# Patient Record
Sex: Female | Born: 1945
Health system: Southern US, Community
[De-identification: ages and names within clinical notes are randomized; demographics above are authoritative.]

## PROBLEM LIST (undated history)

## (undated) DIAGNOSIS — M25562 Pain in left knee: Secondary | ICD-10-CM

## (undated) DIAGNOSIS — I6521 Occlusion and stenosis of right carotid artery: Secondary | ICD-10-CM

## (undated) DIAGNOSIS — L089 Local infection of the skin and subcutaneous tissue, unspecified: Secondary | ICD-10-CM

## (undated) DIAGNOSIS — I639 Cerebral infarction, unspecified: Secondary | ICD-10-CM

## (undated) DIAGNOSIS — I209 Angina pectoris, unspecified: Secondary | ICD-10-CM

## (undated) DIAGNOSIS — E785 Hyperlipidemia, unspecified: Secondary | ICD-10-CM

## (undated) DIAGNOSIS — M479 Spondylosis, unspecified: Secondary | ICD-10-CM

## (undated) DIAGNOSIS — Z823 Family history of stroke: Secondary | ICD-10-CM

## (undated) DIAGNOSIS — W19XXXA Unspecified fall, initial encounter: Secondary | ICD-10-CM

## (undated) DIAGNOSIS — Z593 Problems related to living in residential institution: Secondary | ICD-10-CM

## (undated) DIAGNOSIS — M810 Age-related osteoporosis without current pathological fracture: Secondary | ICD-10-CM

## (undated) DIAGNOSIS — E119 Type 2 diabetes mellitus without complications: Secondary | ICD-10-CM

## (undated) DIAGNOSIS — I110 Hypertensive heart disease with heart failure: Secondary | ICD-10-CM

## (undated) DIAGNOSIS — M5136 Other intervertebral disc degeneration, lumbar region: Secondary | ICD-10-CM

## (undated) DIAGNOSIS — I5032 Chronic diastolic (congestive) heart failure: Secondary | ICD-10-CM

## (undated) DIAGNOSIS — E1165 Type 2 diabetes mellitus with hyperglycemia: Secondary | ICD-10-CM

## (undated) DIAGNOSIS — J449 Chronic obstructive pulmonary disease, unspecified: Secondary | ICD-10-CM

## (undated) DIAGNOSIS — I63231 Cerebral infarction due to unspecified occlusion or stenosis of right carotid arteries: Secondary | ICD-10-CM

## (undated) DIAGNOSIS — E1142 Type 2 diabetes mellitus with diabetic polyneuropathy: Secondary | ICD-10-CM

## (undated) DIAGNOSIS — I10 Essential (primary) hypertension: Secondary | ICD-10-CM

## (undated) DIAGNOSIS — M51369 Other intervertebral disc degeneration, lumbar region without mention of lumbar back pain or lower extremity pain: Secondary | ICD-10-CM

## (undated) DIAGNOSIS — I509 Heart failure, unspecified: Secondary | ICD-10-CM

## (undated) DIAGNOSIS — Z789 Other specified health status: Secondary | ICD-10-CM

## (undated) HISTORY — DX: Cerebral infarction, unspecified: I63.9

## (undated) HISTORY — PX: APPENDECTOMY: SHX54

## (undated) HISTORY — DX: Age-related osteoporosis without current pathological fracture: M81.0

## (undated) HISTORY — DX: Other specified health status: Z78.9

## (undated) HISTORY — PX: ABDOMINAL HYSTERECTOMY: SHX81

## (undated) HISTORY — DX: Hyperlipidemia, unspecified: E78.5

## (undated) HISTORY — DX: Essential (primary) hypertension: I10

## (undated) HISTORY — PX: CARDIAC CATHETERIZATION: SHX172

## (undated) HISTORY — DX: Pain in left knee: M25.562

## (undated) HISTORY — DX: Other intervertebral disc degeneration, lumbar region without mention of lumbar back pain or lower extremity pain: M51.369

## (undated) HISTORY — DX: Type 2 diabetes mellitus without complications: E11.9

## (undated) HISTORY — PX: CHOLECYSTECTOMY: SHX55

## (undated) HISTORY — DX: Spondylosis, unspecified: M47.9

## (undated) HISTORY — DX: Other intervertebral disc degeneration, lumbar region: M51.36

## (undated) HISTORY — DX: Problems related to living in residential institution: Z59.3

## (undated) HISTORY — DX: Heart failure, unspecified: I50.9

---
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
## 2011-08-02 DIAGNOSIS — Z6841 Body Mass Index (BMI) 40.0 and over, adult: Secondary | ICD-10-CM | POA: Diagnosis not present

## 2011-08-02 DIAGNOSIS — IMO0001 Reserved for inherently not codable concepts without codable children: Secondary | ICD-10-CM | POA: Diagnosis not present

## 2011-08-02 DIAGNOSIS — E785 Hyperlipidemia, unspecified: Secondary | ICD-10-CM | POA: Diagnosis not present

## 2011-08-02 DIAGNOSIS — Z79899 Other long term (current) drug therapy: Secondary | ICD-10-CM | POA: Diagnosis not present

## 2011-08-02 DIAGNOSIS — I10 Essential (primary) hypertension: Secondary | ICD-10-CM | POA: Diagnosis not present

## 2011-08-02 DIAGNOSIS — I6789 Other cerebrovascular disease: Secondary | ICD-10-CM | POA: Diagnosis not present

## 2011-09-04 DIAGNOSIS — Z1231 Encounter for screening mammogram for malignant neoplasm of breast: Secondary | ICD-10-CM | POA: Diagnosis not present

## 2011-09-20 DIAGNOSIS — I5030 Unspecified diastolic (congestive) heart failure: Secondary | ICD-10-CM | POA: Diagnosis not present

## 2011-09-20 DIAGNOSIS — I209 Angina pectoris, unspecified: Secondary | ICD-10-CM | POA: Diagnosis not present

## 2011-09-20 DIAGNOSIS — I10 Essential (primary) hypertension: Secondary | ICD-10-CM | POA: Diagnosis not present

## 2011-09-20 DIAGNOSIS — J449 Chronic obstructive pulmonary disease, unspecified: Secondary | ICD-10-CM | POA: Diagnosis not present

## 2011-10-31 DIAGNOSIS — B351 Tinea unguium: Secondary | ICD-10-CM | POA: Diagnosis not present

## 2011-10-31 DIAGNOSIS — E1142 Type 2 diabetes mellitus with diabetic polyneuropathy: Secondary | ICD-10-CM | POA: Diagnosis not present

## 2011-10-31 DIAGNOSIS — E1149 Type 2 diabetes mellitus with other diabetic neurological complication: Secondary | ICD-10-CM | POA: Diagnosis not present

## 2011-11-03 DIAGNOSIS — Z6841 Body Mass Index (BMI) 40.0 and over, adult: Secondary | ICD-10-CM | POA: Diagnosis not present

## 2011-11-03 DIAGNOSIS — I6789 Other cerebrovascular disease: Secondary | ICD-10-CM | POA: Diagnosis not present

## 2011-11-03 DIAGNOSIS — I10 Essential (primary) hypertension: Secondary | ICD-10-CM | POA: Diagnosis not present

## 2011-11-03 DIAGNOSIS — Z79899 Other long term (current) drug therapy: Secondary | ICD-10-CM | POA: Diagnosis not present

## 2011-11-03 DIAGNOSIS — I6350 Cerebral infarction due to unspecified occlusion or stenosis of unspecified cerebral artery: Secondary | ICD-10-CM | POA: Diagnosis not present

## 2011-11-03 DIAGNOSIS — IMO0001 Reserved for inherently not codable concepts without codable children: Secondary | ICD-10-CM | POA: Diagnosis not present

## 2011-11-03 DIAGNOSIS — E785 Hyperlipidemia, unspecified: Secondary | ICD-10-CM | POA: Diagnosis not present

## 2012-02-02 DIAGNOSIS — Z79899 Other long term (current) drug therapy: Secondary | ICD-10-CM | POA: Diagnosis not present

## 2012-02-02 DIAGNOSIS — I5030 Unspecified diastolic (congestive) heart failure: Secondary | ICD-10-CM | POA: Diagnosis not present

## 2012-02-02 DIAGNOSIS — I6789 Other cerebrovascular disease: Secondary | ICD-10-CM | POA: Diagnosis not present

## 2012-02-02 DIAGNOSIS — E785 Hyperlipidemia, unspecified: Secondary | ICD-10-CM | POA: Diagnosis not present

## 2012-02-02 DIAGNOSIS — I10 Essential (primary) hypertension: Secondary | ICD-10-CM | POA: Diagnosis not present

## 2012-02-02 DIAGNOSIS — I6350 Cerebral infarction due to unspecified occlusion or stenosis of unspecified cerebral artery: Secondary | ICD-10-CM | POA: Diagnosis not present

## 2012-03-04 DIAGNOSIS — E119 Type 2 diabetes mellitus without complications: Secondary | ICD-10-CM | POA: Diagnosis not present

## 2012-03-04 DIAGNOSIS — I10 Essential (primary) hypertension: Secondary | ICD-10-CM | POA: Diagnosis not present

## 2012-03-04 DIAGNOSIS — I509 Heart failure, unspecified: Secondary | ICD-10-CM | POA: Diagnosis not present

## 2012-03-04 DIAGNOSIS — I5030 Unspecified diastolic (congestive) heart failure: Secondary | ICD-10-CM | POA: Diagnosis not present

## 2012-04-30 DIAGNOSIS — E785 Hyperlipidemia, unspecified: Secondary | ICD-10-CM | POA: Diagnosis not present

## 2012-04-30 DIAGNOSIS — I10 Essential (primary) hypertension: Secondary | ICD-10-CM | POA: Diagnosis not present

## 2012-04-30 DIAGNOSIS — Z6841 Body Mass Index (BMI) 40.0 and over, adult: Secondary | ICD-10-CM | POA: Diagnosis not present

## 2012-04-30 DIAGNOSIS — I6789 Other cerebrovascular disease: Secondary | ICD-10-CM | POA: Diagnosis not present

## 2012-05-02 DIAGNOSIS — E1142 Type 2 diabetes mellitus with diabetic polyneuropathy: Secondary | ICD-10-CM | POA: Diagnosis not present

## 2012-05-02 DIAGNOSIS — B351 Tinea unguium: Secondary | ICD-10-CM | POA: Diagnosis not present

## 2012-05-02 DIAGNOSIS — E1149 Type 2 diabetes mellitus with other diabetic neurological complication: Secondary | ICD-10-CM | POA: Diagnosis not present

## 2012-06-13 DIAGNOSIS — H524 Presbyopia: Secondary | ICD-10-CM | POA: Diagnosis not present

## 2012-06-13 DIAGNOSIS — H25099 Other age-related incipient cataract, unspecified eye: Secondary | ICD-10-CM | POA: Diagnosis not present

## 2012-07-30 DIAGNOSIS — E785 Hyperlipidemia, unspecified: Secondary | ICD-10-CM | POA: Diagnosis not present

## 2012-07-30 DIAGNOSIS — I6789 Other cerebrovascular disease: Secondary | ICD-10-CM | POA: Diagnosis not present

## 2012-07-30 DIAGNOSIS — R5381 Other malaise: Secondary | ICD-10-CM | POA: Diagnosis not present

## 2012-07-30 DIAGNOSIS — I10 Essential (primary) hypertension: Secondary | ICD-10-CM | POA: Diagnosis not present

## 2012-07-30 DIAGNOSIS — Z79899 Other long term (current) drug therapy: Secondary | ICD-10-CM | POA: Diagnosis not present

## 2012-07-30 DIAGNOSIS — Z6841 Body Mass Index (BMI) 40.0 and over, adult: Secondary | ICD-10-CM | POA: Diagnosis not present

## 2012-09-04 DIAGNOSIS — Z1231 Encounter for screening mammogram for malignant neoplasm of breast: Secondary | ICD-10-CM | POA: Diagnosis not present

## 2012-09-24 DIAGNOSIS — E1169 Type 2 diabetes mellitus with other specified complication: Secondary | ICD-10-CM | POA: Diagnosis not present

## 2012-09-24 DIAGNOSIS — N390 Urinary tract infection, site not specified: Secondary | ICD-10-CM | POA: Diagnosis not present

## 2012-09-24 DIAGNOSIS — Z6841 Body Mass Index (BMI) 40.0 and over, adult: Secondary | ICD-10-CM | POA: Diagnosis not present

## 2012-09-27 DIAGNOSIS — M25559 Pain in unspecified hip: Secondary | ICD-10-CM | POA: Diagnosis not present

## 2012-09-27 DIAGNOSIS — M169 Osteoarthritis of hip, unspecified: Secondary | ICD-10-CM | POA: Diagnosis not present

## 2012-10-22 DIAGNOSIS — I509 Heart failure, unspecified: Secondary | ICD-10-CM | POA: Diagnosis not present

## 2012-10-22 DIAGNOSIS — I209 Angina pectoris, unspecified: Secondary | ICD-10-CM | POA: Diagnosis not present

## 2012-10-22 DIAGNOSIS — E1142 Type 2 diabetes mellitus with diabetic polyneuropathy: Secondary | ICD-10-CM | POA: Diagnosis not present

## 2012-10-22 DIAGNOSIS — I5030 Unspecified diastolic (congestive) heart failure: Secondary | ICD-10-CM | POA: Diagnosis not present

## 2012-10-22 DIAGNOSIS — E1149 Type 2 diabetes mellitus with other diabetic neurological complication: Secondary | ICD-10-CM | POA: Diagnosis not present

## 2012-10-22 DIAGNOSIS — I110 Hypertensive heart disease with heart failure: Secondary | ICD-10-CM | POA: Diagnosis not present

## 2012-10-30 DIAGNOSIS — Z1331 Encounter for screening for depression: Secondary | ICD-10-CM | POA: Diagnosis not present

## 2012-10-30 DIAGNOSIS — I10 Essential (primary) hypertension: Secondary | ICD-10-CM | POA: Diagnosis not present

## 2012-10-30 DIAGNOSIS — Z9181 History of falling: Secondary | ICD-10-CM | POA: Diagnosis not present

## 2012-10-30 DIAGNOSIS — Z79899 Other long term (current) drug therapy: Secondary | ICD-10-CM | POA: Diagnosis not present

## 2012-10-30 DIAGNOSIS — I6789 Other cerebrovascular disease: Secondary | ICD-10-CM | POA: Diagnosis not present

## 2012-10-30 DIAGNOSIS — Z6841 Body Mass Index (BMI) 40.0 and over, adult: Secondary | ICD-10-CM | POA: Diagnosis not present

## 2012-10-30 DIAGNOSIS — E785 Hyperlipidemia, unspecified: Secondary | ICD-10-CM | POA: Diagnosis not present

## 2013-02-03 DIAGNOSIS — I10 Essential (primary) hypertension: Secondary | ICD-10-CM | POA: Diagnosis not present

## 2013-02-03 DIAGNOSIS — Z6841 Body Mass Index (BMI) 40.0 and over, adult: Secondary | ICD-10-CM | POA: Diagnosis not present

## 2013-02-03 DIAGNOSIS — E785 Hyperlipidemia, unspecified: Secondary | ICD-10-CM | POA: Diagnosis not present

## 2013-02-03 DIAGNOSIS — Z79899 Other long term (current) drug therapy: Secondary | ICD-10-CM | POA: Diagnosis not present

## 2013-02-03 DIAGNOSIS — I6789 Other cerebrovascular disease: Secondary | ICD-10-CM | POA: Diagnosis not present

## 2013-02-11 DIAGNOSIS — E1142 Type 2 diabetes mellitus with diabetic polyneuropathy: Secondary | ICD-10-CM | POA: Diagnosis not present

## 2013-02-11 DIAGNOSIS — B351 Tinea unguium: Secondary | ICD-10-CM | POA: Diagnosis not present

## 2013-02-11 DIAGNOSIS — E1149 Type 2 diabetes mellitus with other diabetic neurological complication: Secondary | ICD-10-CM | POA: Diagnosis not present

## 2013-04-25 DIAGNOSIS — E1142 Type 2 diabetes mellitus with diabetic polyneuropathy: Secondary | ICD-10-CM | POA: Diagnosis not present

## 2013-04-25 DIAGNOSIS — Z862 Personal history of diseases of the blood and blood-forming organs and certain disorders involving the immune mechanism: Secondary | ICD-10-CM | POA: Diagnosis not present

## 2013-04-25 DIAGNOSIS — I5030 Unspecified diastolic (congestive) heart failure: Secondary | ICD-10-CM | POA: Diagnosis not present

## 2013-04-25 DIAGNOSIS — I209 Angina pectoris, unspecified: Secondary | ICD-10-CM | POA: Diagnosis not present

## 2013-04-25 DIAGNOSIS — E1149 Type 2 diabetes mellitus with other diabetic neurological complication: Secondary | ICD-10-CM | POA: Diagnosis not present

## 2013-04-25 DIAGNOSIS — I110 Hypertensive heart disease with heart failure: Secondary | ICD-10-CM | POA: Diagnosis not present

## 2013-04-25 DIAGNOSIS — I509 Heart failure, unspecified: Secondary | ICD-10-CM | POA: Diagnosis not present

## 2013-05-06 DIAGNOSIS — Z6841 Body Mass Index (BMI) 40.0 and over, adult: Secondary | ICD-10-CM | POA: Diagnosis not present

## 2013-05-06 DIAGNOSIS — IMO0001 Reserved for inherently not codable concepts without codable children: Secondary | ICD-10-CM | POA: Diagnosis not present

## 2013-05-06 DIAGNOSIS — Z79899 Other long term (current) drug therapy: Secondary | ICD-10-CM | POA: Diagnosis not present

## 2013-05-06 DIAGNOSIS — I10 Essential (primary) hypertension: Secondary | ICD-10-CM | POA: Diagnosis not present

## 2013-05-06 DIAGNOSIS — I6789 Other cerebrovascular disease: Secondary | ICD-10-CM | POA: Diagnosis not present

## 2013-05-06 DIAGNOSIS — E785 Hyperlipidemia, unspecified: Secondary | ICD-10-CM | POA: Diagnosis not present

## 2013-05-06 DIAGNOSIS — Z23 Encounter for immunization: Secondary | ICD-10-CM | POA: Diagnosis not present

## 2013-07-31 DIAGNOSIS — R5383 Other fatigue: Secondary | ICD-10-CM | POA: Diagnosis not present

## 2013-07-31 DIAGNOSIS — Z794 Long term (current) use of insulin: Secondary | ICD-10-CM | POA: Diagnosis not present

## 2013-07-31 DIAGNOSIS — R5381 Other malaise: Secondary | ICD-10-CM | POA: Diagnosis not present

## 2013-07-31 DIAGNOSIS — E7800 Pure hypercholesterolemia, unspecified: Secondary | ICD-10-CM | POA: Diagnosis not present

## 2013-07-31 DIAGNOSIS — R51 Headache: Secondary | ICD-10-CM | POA: Diagnosis not present

## 2013-07-31 DIAGNOSIS — E119 Type 2 diabetes mellitus without complications: Secondary | ICD-10-CM | POA: Diagnosis not present

## 2013-07-31 DIAGNOSIS — I10 Essential (primary) hypertension: Secondary | ICD-10-CM | POA: Diagnosis not present

## 2013-07-31 DIAGNOSIS — I6789 Other cerebrovascular disease: Secondary | ICD-10-CM | POA: Diagnosis not present

## 2013-07-31 DIAGNOSIS — Z79899 Other long term (current) drug therapy: Secondary | ICD-10-CM | POA: Diagnosis not present

## 2013-07-31 DIAGNOSIS — Z7902 Long term (current) use of antithrombotics/antiplatelets: Secondary | ICD-10-CM | POA: Diagnosis not present

## 2013-07-31 DIAGNOSIS — R4789 Other speech disturbances: Secondary | ICD-10-CM | POA: Diagnosis not present

## 2013-07-31 DIAGNOSIS — R079 Chest pain, unspecified: Secondary | ICD-10-CM | POA: Diagnosis not present

## 2013-07-31 DIAGNOSIS — I509 Heart failure, unspecified: Secondary | ICD-10-CM | POA: Diagnosis not present

## 2013-07-31 DIAGNOSIS — Z8673 Personal history of transient ischemic attack (TIA), and cerebral infarction without residual deficits: Secondary | ICD-10-CM | POA: Diagnosis not present

## 2013-08-01 DIAGNOSIS — IMO0001 Reserved for inherently not codable concepts without codable children: Secondary | ICD-10-CM | POA: Diagnosis not present

## 2013-08-01 DIAGNOSIS — R5381 Other malaise: Secondary | ICD-10-CM | POA: Diagnosis not present

## 2013-08-01 DIAGNOSIS — Z794 Long term (current) use of insulin: Secondary | ICD-10-CM | POA: Diagnosis not present

## 2013-08-01 DIAGNOSIS — R4182 Altered mental status, unspecified: Secondary | ICD-10-CM | POA: Diagnosis not present

## 2013-08-01 DIAGNOSIS — E7800 Pure hypercholesterolemia, unspecified: Secondary | ICD-10-CM | POA: Diagnosis not present

## 2013-08-01 DIAGNOSIS — E161 Other hypoglycemia: Secondary | ICD-10-CM | POA: Diagnosis not present

## 2013-08-01 DIAGNOSIS — R262 Difficulty in walking, not elsewhere classified: Secondary | ICD-10-CM | POA: Diagnosis not present

## 2013-08-01 DIAGNOSIS — Z79899 Other long term (current) drug therapy: Secondary | ICD-10-CM | POA: Diagnosis not present

## 2013-08-01 DIAGNOSIS — R404 Transient alteration of awareness: Secondary | ICD-10-CM | POA: Diagnosis not present

## 2013-08-01 DIAGNOSIS — I509 Heart failure, unspecified: Secondary | ICD-10-CM | POA: Diagnosis not present

## 2013-08-01 DIAGNOSIS — I10 Essential (primary) hypertension: Secondary | ICD-10-CM | POA: Diagnosis not present

## 2013-08-01 DIAGNOSIS — M6281 Muscle weakness (generalized): Secondary | ICD-10-CM | POA: Diagnosis not present

## 2013-08-01 DIAGNOSIS — E119 Type 2 diabetes mellitus without complications: Secondary | ICD-10-CM | POA: Diagnosis not present

## 2013-08-02 DIAGNOSIS — R262 Difficulty in walking, not elsewhere classified: Secondary | ICD-10-CM | POA: Diagnosis not present

## 2013-08-02 DIAGNOSIS — IMO0001 Reserved for inherently not codable concepts without codable children: Secondary | ICD-10-CM | POA: Diagnosis not present

## 2013-08-02 DIAGNOSIS — M6281 Muscle weakness (generalized): Secondary | ICD-10-CM | POA: Diagnosis not present

## 2013-08-02 DIAGNOSIS — E161 Other hypoglycemia: Secondary | ICD-10-CM | POA: Diagnosis not present

## 2013-08-02 DIAGNOSIS — Z794 Long term (current) use of insulin: Secondary | ICD-10-CM | POA: Diagnosis not present

## 2013-08-02 DIAGNOSIS — R7301 Impaired fasting glucose: Secondary | ICD-10-CM | POA: Diagnosis not present

## 2013-08-02 DIAGNOSIS — I509 Heart failure, unspecified: Secondary | ICD-10-CM | POA: Diagnosis not present

## 2013-08-02 DIAGNOSIS — E119 Type 2 diabetes mellitus without complications: Secondary | ICD-10-CM | POA: Diagnosis not present

## 2013-08-03 DIAGNOSIS — M6281 Muscle weakness (generalized): Secondary | ICD-10-CM | POA: Diagnosis not present

## 2013-08-03 DIAGNOSIS — Z794 Long term (current) use of insulin: Secondary | ICD-10-CM | POA: Diagnosis not present

## 2013-08-03 DIAGNOSIS — E119 Type 2 diabetes mellitus without complications: Secondary | ICD-10-CM | POA: Diagnosis not present

## 2013-08-03 DIAGNOSIS — R262 Difficulty in walking, not elsewhere classified: Secondary | ICD-10-CM | POA: Diagnosis not present

## 2013-08-03 DIAGNOSIS — I509 Heart failure, unspecified: Secondary | ICD-10-CM | POA: Diagnosis not present

## 2013-08-03 DIAGNOSIS — IMO0001 Reserved for inherently not codable concepts without codable children: Secondary | ICD-10-CM | POA: Diagnosis not present

## 2013-08-04 DIAGNOSIS — I509 Heart failure, unspecified: Secondary | ICD-10-CM | POA: Diagnosis not present

## 2013-08-04 DIAGNOSIS — R262 Difficulty in walking, not elsewhere classified: Secondary | ICD-10-CM | POA: Diagnosis not present

## 2013-08-04 DIAGNOSIS — IMO0001 Reserved for inherently not codable concepts without codable children: Secondary | ICD-10-CM | POA: Diagnosis not present

## 2013-08-04 DIAGNOSIS — M6281 Muscle weakness (generalized): Secondary | ICD-10-CM | POA: Diagnosis not present

## 2013-08-04 DIAGNOSIS — E119 Type 2 diabetes mellitus without complications: Secondary | ICD-10-CM | POA: Diagnosis not present

## 2013-08-04 DIAGNOSIS — Z794 Long term (current) use of insulin: Secondary | ICD-10-CM | POA: Diagnosis not present

## 2013-08-05 DIAGNOSIS — I509 Heart failure, unspecified: Secondary | ICD-10-CM | POA: Diagnosis not present

## 2013-08-05 DIAGNOSIS — Z794 Long term (current) use of insulin: Secondary | ICD-10-CM | POA: Diagnosis not present

## 2013-08-05 DIAGNOSIS — M6281 Muscle weakness (generalized): Secondary | ICD-10-CM | POA: Diagnosis not present

## 2013-08-05 DIAGNOSIS — IMO0001 Reserved for inherently not codable concepts without codable children: Secondary | ICD-10-CM | POA: Diagnosis not present

## 2013-08-05 DIAGNOSIS — R262 Difficulty in walking, not elsewhere classified: Secondary | ICD-10-CM | POA: Diagnosis not present

## 2013-08-05 DIAGNOSIS — E119 Type 2 diabetes mellitus without complications: Secondary | ICD-10-CM | POA: Diagnosis not present

## 2013-08-06 DIAGNOSIS — I6789 Other cerebrovascular disease: Secondary | ICD-10-CM | POA: Diagnosis not present

## 2013-08-06 DIAGNOSIS — Z79899 Other long term (current) drug therapy: Secondary | ICD-10-CM | POA: Diagnosis not present

## 2013-08-06 DIAGNOSIS — Z6841 Body Mass Index (BMI) 40.0 and over, adult: Secondary | ICD-10-CM | POA: Diagnosis not present

## 2013-08-06 DIAGNOSIS — E785 Hyperlipidemia, unspecified: Secondary | ICD-10-CM | POA: Diagnosis not present

## 2013-08-06 DIAGNOSIS — IMO0001 Reserved for inherently not codable concepts without codable children: Secondary | ICD-10-CM | POA: Diagnosis not present

## 2013-08-06 DIAGNOSIS — I10 Essential (primary) hypertension: Secondary | ICD-10-CM | POA: Diagnosis not present

## 2013-08-07 DIAGNOSIS — E119 Type 2 diabetes mellitus without complications: Secondary | ICD-10-CM | POA: Diagnosis not present

## 2013-08-07 DIAGNOSIS — R262 Difficulty in walking, not elsewhere classified: Secondary | ICD-10-CM | POA: Diagnosis not present

## 2013-08-07 DIAGNOSIS — Z794 Long term (current) use of insulin: Secondary | ICD-10-CM | POA: Diagnosis not present

## 2013-08-07 DIAGNOSIS — M6281 Muscle weakness (generalized): Secondary | ICD-10-CM | POA: Diagnosis not present

## 2013-08-07 DIAGNOSIS — IMO0001 Reserved for inherently not codable concepts without codable children: Secondary | ICD-10-CM | POA: Diagnosis not present

## 2013-08-07 DIAGNOSIS — I509 Heart failure, unspecified: Secondary | ICD-10-CM | POA: Diagnosis not present

## 2013-08-12 DIAGNOSIS — I509 Heart failure, unspecified: Secondary | ICD-10-CM | POA: Diagnosis not present

## 2013-08-12 DIAGNOSIS — IMO0001 Reserved for inherently not codable concepts without codable children: Secondary | ICD-10-CM | POA: Diagnosis not present

## 2013-08-12 DIAGNOSIS — Z794 Long term (current) use of insulin: Secondary | ICD-10-CM | POA: Diagnosis not present

## 2013-08-12 DIAGNOSIS — M6281 Muscle weakness (generalized): Secondary | ICD-10-CM | POA: Diagnosis not present

## 2013-08-12 DIAGNOSIS — R262 Difficulty in walking, not elsewhere classified: Secondary | ICD-10-CM | POA: Diagnosis not present

## 2013-08-12 DIAGNOSIS — E119 Type 2 diabetes mellitus without complications: Secondary | ICD-10-CM | POA: Diagnosis not present

## 2013-08-14 DIAGNOSIS — IMO0001 Reserved for inherently not codable concepts without codable children: Secondary | ICD-10-CM | POA: Diagnosis not present

## 2013-08-14 DIAGNOSIS — R262 Difficulty in walking, not elsewhere classified: Secondary | ICD-10-CM | POA: Diagnosis not present

## 2013-08-14 DIAGNOSIS — Z794 Long term (current) use of insulin: Secondary | ICD-10-CM | POA: Diagnosis not present

## 2013-08-14 DIAGNOSIS — M6281 Muscle weakness (generalized): Secondary | ICD-10-CM | POA: Diagnosis not present

## 2013-08-14 DIAGNOSIS — E119 Type 2 diabetes mellitus without complications: Secondary | ICD-10-CM | POA: Diagnosis not present

## 2013-08-14 DIAGNOSIS — I509 Heart failure, unspecified: Secondary | ICD-10-CM | POA: Diagnosis not present

## 2013-08-18 DIAGNOSIS — R262 Difficulty in walking, not elsewhere classified: Secondary | ICD-10-CM | POA: Diagnosis not present

## 2013-08-18 DIAGNOSIS — M6281 Muscle weakness (generalized): Secondary | ICD-10-CM | POA: Diagnosis not present

## 2013-08-18 DIAGNOSIS — E119 Type 2 diabetes mellitus without complications: Secondary | ICD-10-CM | POA: Diagnosis not present

## 2013-08-18 DIAGNOSIS — Z794 Long term (current) use of insulin: Secondary | ICD-10-CM | POA: Diagnosis not present

## 2013-08-18 DIAGNOSIS — IMO0001 Reserved for inherently not codable concepts without codable children: Secondary | ICD-10-CM | POA: Diagnosis not present

## 2013-08-18 DIAGNOSIS — I509 Heart failure, unspecified: Secondary | ICD-10-CM | POA: Diagnosis not present

## 2013-08-20 DIAGNOSIS — IMO0001 Reserved for inherently not codable concepts without codable children: Secondary | ICD-10-CM | POA: Diagnosis not present

## 2013-08-20 DIAGNOSIS — Z794 Long term (current) use of insulin: Secondary | ICD-10-CM | POA: Diagnosis not present

## 2013-08-20 DIAGNOSIS — M6281 Muscle weakness (generalized): Secondary | ICD-10-CM | POA: Diagnosis not present

## 2013-08-20 DIAGNOSIS — I509 Heart failure, unspecified: Secondary | ICD-10-CM | POA: Diagnosis not present

## 2013-08-20 DIAGNOSIS — E119 Type 2 diabetes mellitus without complications: Secondary | ICD-10-CM | POA: Diagnosis not present

## 2013-08-20 DIAGNOSIS — R262 Difficulty in walking, not elsewhere classified: Secondary | ICD-10-CM | POA: Diagnosis not present

## 2013-08-21 DIAGNOSIS — R262 Difficulty in walking, not elsewhere classified: Secondary | ICD-10-CM | POA: Diagnosis not present

## 2013-08-21 DIAGNOSIS — M6281 Muscle weakness (generalized): Secondary | ICD-10-CM | POA: Diagnosis not present

## 2013-08-21 DIAGNOSIS — Z794 Long term (current) use of insulin: Secondary | ICD-10-CM | POA: Diagnosis not present

## 2013-08-21 DIAGNOSIS — IMO0001 Reserved for inherently not codable concepts without codable children: Secondary | ICD-10-CM | POA: Diagnosis not present

## 2013-08-21 DIAGNOSIS — E119 Type 2 diabetes mellitus without complications: Secondary | ICD-10-CM | POA: Diagnosis not present

## 2013-08-21 DIAGNOSIS — I509 Heart failure, unspecified: Secondary | ICD-10-CM | POA: Diagnosis not present

## 2013-08-25 DIAGNOSIS — R262 Difficulty in walking, not elsewhere classified: Secondary | ICD-10-CM | POA: Diagnosis not present

## 2013-08-25 DIAGNOSIS — IMO0001 Reserved for inherently not codable concepts without codable children: Secondary | ICD-10-CM | POA: Diagnosis not present

## 2013-08-25 DIAGNOSIS — Z794 Long term (current) use of insulin: Secondary | ICD-10-CM | POA: Diagnosis not present

## 2013-08-25 DIAGNOSIS — M6281 Muscle weakness (generalized): Secondary | ICD-10-CM | POA: Diagnosis not present

## 2013-08-25 DIAGNOSIS — E119 Type 2 diabetes mellitus without complications: Secondary | ICD-10-CM | POA: Diagnosis not present

## 2013-08-25 DIAGNOSIS — I509 Heart failure, unspecified: Secondary | ICD-10-CM | POA: Diagnosis not present

## 2013-08-27 DIAGNOSIS — R262 Difficulty in walking, not elsewhere classified: Secondary | ICD-10-CM | POA: Diagnosis not present

## 2013-08-27 DIAGNOSIS — M6281 Muscle weakness (generalized): Secondary | ICD-10-CM | POA: Diagnosis not present

## 2013-08-27 DIAGNOSIS — Z794 Long term (current) use of insulin: Secondary | ICD-10-CM | POA: Diagnosis not present

## 2013-08-27 DIAGNOSIS — I509 Heart failure, unspecified: Secondary | ICD-10-CM | POA: Diagnosis not present

## 2013-08-27 DIAGNOSIS — E119 Type 2 diabetes mellitus without complications: Secondary | ICD-10-CM | POA: Diagnosis not present

## 2013-08-27 DIAGNOSIS — IMO0001 Reserved for inherently not codable concepts without codable children: Secondary | ICD-10-CM | POA: Diagnosis not present

## 2013-08-28 DIAGNOSIS — M6281 Muscle weakness (generalized): Secondary | ICD-10-CM | POA: Diagnosis not present

## 2013-08-28 DIAGNOSIS — Z794 Long term (current) use of insulin: Secondary | ICD-10-CM | POA: Diagnosis not present

## 2013-08-28 DIAGNOSIS — E119 Type 2 diabetes mellitus without complications: Secondary | ICD-10-CM | POA: Diagnosis not present

## 2013-08-28 DIAGNOSIS — IMO0001 Reserved for inherently not codable concepts without codable children: Secondary | ICD-10-CM | POA: Diagnosis not present

## 2013-08-28 DIAGNOSIS — I509 Heart failure, unspecified: Secondary | ICD-10-CM | POA: Diagnosis not present

## 2013-08-28 DIAGNOSIS — R262 Difficulty in walking, not elsewhere classified: Secondary | ICD-10-CM | POA: Diagnosis not present

## 2013-09-02 DIAGNOSIS — IMO0001 Reserved for inherently not codable concepts without codable children: Secondary | ICD-10-CM | POA: Diagnosis not present

## 2013-09-02 DIAGNOSIS — Z794 Long term (current) use of insulin: Secondary | ICD-10-CM | POA: Diagnosis not present

## 2013-09-02 DIAGNOSIS — M6281 Muscle weakness (generalized): Secondary | ICD-10-CM | POA: Diagnosis not present

## 2013-09-02 DIAGNOSIS — R262 Difficulty in walking, not elsewhere classified: Secondary | ICD-10-CM | POA: Diagnosis not present

## 2013-09-02 DIAGNOSIS — E119 Type 2 diabetes mellitus without complications: Secondary | ICD-10-CM | POA: Diagnosis not present

## 2013-09-02 DIAGNOSIS — I509 Heart failure, unspecified: Secondary | ICD-10-CM | POA: Diagnosis not present

## 2013-09-05 DIAGNOSIS — Z1231 Encounter for screening mammogram for malignant neoplasm of breast: Secondary | ICD-10-CM | POA: Diagnosis not present

## 2013-09-11 DIAGNOSIS — E1149 Type 2 diabetes mellitus with other diabetic neurological complication: Secondary | ICD-10-CM | POA: Diagnosis not present

## 2013-09-11 DIAGNOSIS — B351 Tinea unguium: Secondary | ICD-10-CM | POA: Diagnosis not present

## 2013-09-11 DIAGNOSIS — E1142 Type 2 diabetes mellitus with diabetic polyneuropathy: Secondary | ICD-10-CM | POA: Diagnosis not present

## 2013-09-16 DIAGNOSIS — R269 Unspecified abnormalities of gait and mobility: Secondary | ICD-10-CM | POA: Diagnosis not present

## 2013-09-16 DIAGNOSIS — M6281 Muscle weakness (generalized): Secondary | ICD-10-CM | POA: Diagnosis not present

## 2013-09-16 DIAGNOSIS — I6789 Other cerebrovascular disease: Secondary | ICD-10-CM | POA: Diagnosis not present

## 2013-09-16 DIAGNOSIS — R279 Unspecified lack of coordination: Secondary | ICD-10-CM | POA: Diagnosis not present

## 2013-09-16 DIAGNOSIS — I5030 Unspecified diastolic (congestive) heart failure: Secondary | ICD-10-CM | POA: Diagnosis not present

## 2013-09-16 DIAGNOSIS — IMO0001 Reserved for inherently not codable concepts without codable children: Secondary | ICD-10-CM | POA: Diagnosis not present

## 2013-09-19 DIAGNOSIS — IMO0001 Reserved for inherently not codable concepts without codable children: Secondary | ICD-10-CM | POA: Diagnosis not present

## 2013-09-19 DIAGNOSIS — I6789 Other cerebrovascular disease: Secondary | ICD-10-CM | POA: Diagnosis not present

## 2013-09-19 DIAGNOSIS — I5030 Unspecified diastolic (congestive) heart failure: Secondary | ICD-10-CM | POA: Diagnosis not present

## 2013-09-19 DIAGNOSIS — R279 Unspecified lack of coordination: Secondary | ICD-10-CM | POA: Diagnosis not present

## 2013-09-19 DIAGNOSIS — R269 Unspecified abnormalities of gait and mobility: Secondary | ICD-10-CM | POA: Diagnosis not present

## 2013-09-19 DIAGNOSIS — M6281 Muscle weakness (generalized): Secondary | ICD-10-CM | POA: Diagnosis not present

## 2013-09-22 DIAGNOSIS — I5030 Unspecified diastolic (congestive) heart failure: Secondary | ICD-10-CM | POA: Diagnosis not present

## 2013-09-22 DIAGNOSIS — R279 Unspecified lack of coordination: Secondary | ICD-10-CM | POA: Diagnosis not present

## 2013-09-22 DIAGNOSIS — IMO0001 Reserved for inherently not codable concepts without codable children: Secondary | ICD-10-CM | POA: Diagnosis not present

## 2013-09-22 DIAGNOSIS — M6281 Muscle weakness (generalized): Secondary | ICD-10-CM | POA: Diagnosis not present

## 2013-09-22 DIAGNOSIS — I6789 Other cerebrovascular disease: Secondary | ICD-10-CM | POA: Diagnosis not present

## 2013-09-22 DIAGNOSIS — R269 Unspecified abnormalities of gait and mobility: Secondary | ICD-10-CM | POA: Diagnosis not present

## 2013-09-24 DIAGNOSIS — R279 Unspecified lack of coordination: Secondary | ICD-10-CM | POA: Diagnosis not present

## 2013-09-24 DIAGNOSIS — R269 Unspecified abnormalities of gait and mobility: Secondary | ICD-10-CM | POA: Diagnosis not present

## 2013-09-24 DIAGNOSIS — IMO0001 Reserved for inherently not codable concepts without codable children: Secondary | ICD-10-CM | POA: Diagnosis not present

## 2013-09-24 DIAGNOSIS — I6789 Other cerebrovascular disease: Secondary | ICD-10-CM | POA: Diagnosis not present

## 2013-09-24 DIAGNOSIS — I5030 Unspecified diastolic (congestive) heart failure: Secondary | ICD-10-CM | POA: Diagnosis not present

## 2013-09-24 DIAGNOSIS — M6281 Muscle weakness (generalized): Secondary | ICD-10-CM | POA: Diagnosis not present

## 2013-09-26 DIAGNOSIS — E119 Type 2 diabetes mellitus without complications: Secondary | ICD-10-CM | POA: Diagnosis not present

## 2013-09-26 DIAGNOSIS — I10 Essential (primary) hypertension: Secondary | ICD-10-CM | POA: Diagnosis not present

## 2013-09-26 DIAGNOSIS — H5200 Hypermetropia, unspecified eye: Secondary | ICD-10-CM | POA: Diagnosis not present

## 2013-09-26 DIAGNOSIS — H524 Presbyopia: Secondary | ICD-10-CM | POA: Diagnosis not present

## 2013-09-30 DIAGNOSIS — IMO0001 Reserved for inherently not codable concepts without codable children: Secondary | ICD-10-CM | POA: Diagnosis not present

## 2013-09-30 DIAGNOSIS — I6789 Other cerebrovascular disease: Secondary | ICD-10-CM | POA: Diagnosis not present

## 2013-09-30 DIAGNOSIS — M6281 Muscle weakness (generalized): Secondary | ICD-10-CM | POA: Diagnosis not present

## 2013-09-30 DIAGNOSIS — R269 Unspecified abnormalities of gait and mobility: Secondary | ICD-10-CM | POA: Diagnosis not present

## 2013-09-30 DIAGNOSIS — I5030 Unspecified diastolic (congestive) heart failure: Secondary | ICD-10-CM | POA: Diagnosis not present

## 2013-09-30 DIAGNOSIS — R279 Unspecified lack of coordination: Secondary | ICD-10-CM | POA: Diagnosis not present

## 2013-10-03 DIAGNOSIS — I5030 Unspecified diastolic (congestive) heart failure: Secondary | ICD-10-CM | POA: Diagnosis not present

## 2013-10-03 DIAGNOSIS — R269 Unspecified abnormalities of gait and mobility: Secondary | ICD-10-CM | POA: Diagnosis not present

## 2013-10-03 DIAGNOSIS — I6789 Other cerebrovascular disease: Secondary | ICD-10-CM | POA: Diagnosis not present

## 2013-10-03 DIAGNOSIS — M6281 Muscle weakness (generalized): Secondary | ICD-10-CM | POA: Diagnosis not present

## 2013-10-03 DIAGNOSIS — IMO0001 Reserved for inherently not codable concepts without codable children: Secondary | ICD-10-CM | POA: Diagnosis not present

## 2013-10-03 DIAGNOSIS — R279 Unspecified lack of coordination: Secondary | ICD-10-CM | POA: Diagnosis not present

## 2013-10-06 DIAGNOSIS — IMO0001 Reserved for inherently not codable concepts without codable children: Secondary | ICD-10-CM | POA: Diagnosis not present

## 2013-10-06 DIAGNOSIS — I6789 Other cerebrovascular disease: Secondary | ICD-10-CM | POA: Diagnosis not present

## 2013-10-06 DIAGNOSIS — R279 Unspecified lack of coordination: Secondary | ICD-10-CM | POA: Diagnosis not present

## 2013-10-06 DIAGNOSIS — R269 Unspecified abnormalities of gait and mobility: Secondary | ICD-10-CM | POA: Diagnosis not present

## 2013-10-06 DIAGNOSIS — M6281 Muscle weakness (generalized): Secondary | ICD-10-CM | POA: Diagnosis not present

## 2013-10-06 DIAGNOSIS — I5030 Unspecified diastolic (congestive) heart failure: Secondary | ICD-10-CM | POA: Diagnosis not present

## 2013-10-08 DIAGNOSIS — R269 Unspecified abnormalities of gait and mobility: Secondary | ICD-10-CM | POA: Diagnosis not present

## 2013-10-08 DIAGNOSIS — M6281 Muscle weakness (generalized): Secondary | ICD-10-CM | POA: Diagnosis not present

## 2013-10-08 DIAGNOSIS — I6789 Other cerebrovascular disease: Secondary | ICD-10-CM | POA: Diagnosis not present

## 2013-10-08 DIAGNOSIS — I5030 Unspecified diastolic (congestive) heart failure: Secondary | ICD-10-CM | POA: Diagnosis not present

## 2013-10-08 DIAGNOSIS — R279 Unspecified lack of coordination: Secondary | ICD-10-CM | POA: Diagnosis not present

## 2013-10-08 DIAGNOSIS — IMO0001 Reserved for inherently not codable concepts without codable children: Secondary | ICD-10-CM | POA: Diagnosis not present

## 2013-10-13 DIAGNOSIS — I5030 Unspecified diastolic (congestive) heart failure: Secondary | ICD-10-CM | POA: Diagnosis not present

## 2013-10-13 DIAGNOSIS — I6789 Other cerebrovascular disease: Secondary | ICD-10-CM | POA: Diagnosis not present

## 2013-10-13 DIAGNOSIS — M6281 Muscle weakness (generalized): Secondary | ICD-10-CM | POA: Diagnosis not present

## 2013-10-13 DIAGNOSIS — R279 Unspecified lack of coordination: Secondary | ICD-10-CM | POA: Diagnosis not present

## 2013-10-13 DIAGNOSIS — IMO0001 Reserved for inherently not codable concepts without codable children: Secondary | ICD-10-CM | POA: Diagnosis not present

## 2013-10-13 DIAGNOSIS — R269 Unspecified abnormalities of gait and mobility: Secondary | ICD-10-CM | POA: Diagnosis not present

## 2013-10-15 DIAGNOSIS — R279 Unspecified lack of coordination: Secondary | ICD-10-CM | POA: Diagnosis not present

## 2013-10-15 DIAGNOSIS — IMO0001 Reserved for inherently not codable concepts without codable children: Secondary | ICD-10-CM | POA: Diagnosis not present

## 2013-10-15 DIAGNOSIS — R269 Unspecified abnormalities of gait and mobility: Secondary | ICD-10-CM | POA: Diagnosis not present

## 2013-10-15 DIAGNOSIS — M6281 Muscle weakness (generalized): Secondary | ICD-10-CM | POA: Diagnosis not present

## 2013-10-15 DIAGNOSIS — I6789 Other cerebrovascular disease: Secondary | ICD-10-CM | POA: Diagnosis not present

## 2013-10-15 DIAGNOSIS — I5030 Unspecified diastolic (congestive) heart failure: Secondary | ICD-10-CM | POA: Diagnosis not present

## 2013-10-20 DIAGNOSIS — M6281 Muscle weakness (generalized): Secondary | ICD-10-CM | POA: Diagnosis not present

## 2013-10-20 DIAGNOSIS — R269 Unspecified abnormalities of gait and mobility: Secondary | ICD-10-CM | POA: Diagnosis not present

## 2013-10-20 DIAGNOSIS — I5030 Unspecified diastolic (congestive) heart failure: Secondary | ICD-10-CM | POA: Diagnosis not present

## 2013-10-20 DIAGNOSIS — IMO0001 Reserved for inherently not codable concepts without codable children: Secondary | ICD-10-CM | POA: Diagnosis not present

## 2013-10-20 DIAGNOSIS — I6789 Other cerebrovascular disease: Secondary | ICD-10-CM | POA: Diagnosis not present

## 2013-10-20 DIAGNOSIS — R279 Unspecified lack of coordination: Secondary | ICD-10-CM | POA: Diagnosis not present

## 2013-11-03 DIAGNOSIS — R279 Unspecified lack of coordination: Secondary | ICD-10-CM | POA: Diagnosis not present

## 2013-11-03 DIAGNOSIS — I5030 Unspecified diastolic (congestive) heart failure: Secondary | ICD-10-CM | POA: Diagnosis not present

## 2013-11-03 DIAGNOSIS — M6281 Muscle weakness (generalized): Secondary | ICD-10-CM | POA: Diagnosis not present

## 2013-11-03 DIAGNOSIS — IMO0001 Reserved for inherently not codable concepts without codable children: Secondary | ICD-10-CM | POA: Diagnosis not present

## 2013-11-03 DIAGNOSIS — I6789 Other cerebrovascular disease: Secondary | ICD-10-CM | POA: Diagnosis not present

## 2013-11-03 DIAGNOSIS — R269 Unspecified abnormalities of gait and mobility: Secondary | ICD-10-CM | POA: Diagnosis not present

## 2013-11-05 DIAGNOSIS — I5030 Unspecified diastolic (congestive) heart failure: Secondary | ICD-10-CM | POA: Diagnosis not present

## 2013-11-05 DIAGNOSIS — IMO0001 Reserved for inherently not codable concepts without codable children: Secondary | ICD-10-CM | POA: Diagnosis not present

## 2013-11-05 DIAGNOSIS — R279 Unspecified lack of coordination: Secondary | ICD-10-CM | POA: Diagnosis not present

## 2013-11-05 DIAGNOSIS — M6281 Muscle weakness (generalized): Secondary | ICD-10-CM | POA: Diagnosis not present

## 2013-11-05 DIAGNOSIS — I6789 Other cerebrovascular disease: Secondary | ICD-10-CM | POA: Diagnosis not present

## 2013-11-05 DIAGNOSIS — R269 Unspecified abnormalities of gait and mobility: Secondary | ICD-10-CM | POA: Diagnosis not present

## 2013-11-10 DIAGNOSIS — I6789 Other cerebrovascular disease: Secondary | ICD-10-CM | POA: Diagnosis not present

## 2013-11-10 DIAGNOSIS — Z9181 History of falling: Secondary | ICD-10-CM | POA: Diagnosis not present

## 2013-11-10 DIAGNOSIS — E785 Hyperlipidemia, unspecified: Secondary | ICD-10-CM | POA: Diagnosis not present

## 2013-11-10 DIAGNOSIS — I10 Essential (primary) hypertension: Secondary | ICD-10-CM | POA: Diagnosis not present

## 2013-11-10 DIAGNOSIS — E1149 Type 2 diabetes mellitus with other diabetic neurological complication: Secondary | ICD-10-CM | POA: Diagnosis not present

## 2013-11-10 DIAGNOSIS — Z1331 Encounter for screening for depression: Secondary | ICD-10-CM | POA: Diagnosis not present

## 2013-11-10 DIAGNOSIS — Z79899 Other long term (current) drug therapy: Secondary | ICD-10-CM | POA: Diagnosis not present

## 2013-11-11 DIAGNOSIS — R279 Unspecified lack of coordination: Secondary | ICD-10-CM | POA: Diagnosis not present

## 2013-11-11 DIAGNOSIS — I6789 Other cerebrovascular disease: Secondary | ICD-10-CM | POA: Diagnosis not present

## 2013-11-11 DIAGNOSIS — M6281 Muscle weakness (generalized): Secondary | ICD-10-CM | POA: Diagnosis not present

## 2013-11-11 DIAGNOSIS — IMO0001 Reserved for inherently not codable concepts without codable children: Secondary | ICD-10-CM | POA: Diagnosis not present

## 2013-11-11 DIAGNOSIS — R269 Unspecified abnormalities of gait and mobility: Secondary | ICD-10-CM | POA: Diagnosis not present

## 2013-11-11 DIAGNOSIS — I5030 Unspecified diastolic (congestive) heart failure: Secondary | ICD-10-CM | POA: Diagnosis not present

## 2013-12-02 DIAGNOSIS — I5032 Chronic diastolic (congestive) heart failure: Secondary | ICD-10-CM | POA: Diagnosis not present

## 2013-12-02 DIAGNOSIS — E785 Hyperlipidemia, unspecified: Secondary | ICD-10-CM | POA: Diagnosis not present

## 2013-12-02 DIAGNOSIS — I209 Angina pectoris, unspecified: Secondary | ICD-10-CM | POA: Diagnosis not present

## 2013-12-02 DIAGNOSIS — E119 Type 2 diabetes mellitus without complications: Secondary | ICD-10-CM | POA: Diagnosis not present

## 2013-12-02 DIAGNOSIS — J449 Chronic obstructive pulmonary disease, unspecified: Secondary | ICD-10-CM | POA: Diagnosis not present

## 2013-12-02 DIAGNOSIS — I509 Heart failure, unspecified: Secondary | ICD-10-CM | POA: Diagnosis not present

## 2013-12-02 DIAGNOSIS — I110 Hypertensive heart disease with heart failure: Secondary | ICD-10-CM | POA: Diagnosis not present

## 2014-02-16 DIAGNOSIS — Z79899 Other long term (current) drug therapy: Secondary | ICD-10-CM | POA: Diagnosis not present

## 2014-02-16 DIAGNOSIS — E1140 Type 2 diabetes mellitus with diabetic neuropathy, unspecified: Secondary | ICD-10-CM | POA: Diagnosis not present

## 2014-02-16 DIAGNOSIS — E785 Hyperlipidemia, unspecified: Secondary | ICD-10-CM | POA: Diagnosis not present

## 2014-02-16 DIAGNOSIS — I639 Cerebral infarction, unspecified: Secondary | ICD-10-CM | POA: Diagnosis not present

## 2014-02-16 DIAGNOSIS — Z6841 Body Mass Index (BMI) 40.0 and over, adult: Secondary | ICD-10-CM | POA: Diagnosis not present

## 2014-02-16 DIAGNOSIS — I110 Hypertensive heart disease with heart failure: Secondary | ICD-10-CM | POA: Diagnosis not present

## 2014-03-26 DIAGNOSIS — B351 Tinea unguium: Secondary | ICD-10-CM | POA: Diagnosis not present

## 2014-03-26 DIAGNOSIS — E1142 Type 2 diabetes mellitus with diabetic polyneuropathy: Secondary | ICD-10-CM | POA: Diagnosis not present

## 2014-05-26 DIAGNOSIS — I639 Cerebral infarction, unspecified: Secondary | ICD-10-CM | POA: Diagnosis not present

## 2014-05-26 DIAGNOSIS — E785 Hyperlipidemia, unspecified: Secondary | ICD-10-CM | POA: Diagnosis not present

## 2014-05-26 DIAGNOSIS — Z79899 Other long term (current) drug therapy: Secondary | ICD-10-CM | POA: Diagnosis not present

## 2014-05-26 DIAGNOSIS — E1140 Type 2 diabetes mellitus with diabetic neuropathy, unspecified: Secondary | ICD-10-CM | POA: Diagnosis not present

## 2014-05-26 DIAGNOSIS — H938X1 Other specified disorders of right ear: Secondary | ICD-10-CM | POA: Diagnosis not present

## 2014-05-26 DIAGNOSIS — I110 Hypertensive heart disease with heart failure: Secondary | ICD-10-CM | POA: Diagnosis not present

## 2014-05-26 DIAGNOSIS — I5032 Chronic diastolic (congestive) heart failure: Secondary | ICD-10-CM | POA: Diagnosis not present

## 2014-05-26 DIAGNOSIS — H6121 Impacted cerumen, right ear: Secondary | ICD-10-CM | POA: Diagnosis not present

## 2014-05-26 DIAGNOSIS — Z6841 Body Mass Index (BMI) 40.0 and over, adult: Secondary | ICD-10-CM | POA: Diagnosis not present

## 2014-07-02 DIAGNOSIS — B351 Tinea unguium: Secondary | ICD-10-CM | POA: Diagnosis not present

## 2014-09-02 DIAGNOSIS — I110 Hypertensive heart disease with heart failure: Secondary | ICD-10-CM | POA: Diagnosis not present

## 2014-09-02 DIAGNOSIS — E1140 Type 2 diabetes mellitus with diabetic neuropathy, unspecified: Secondary | ICD-10-CM | POA: Diagnosis not present

## 2014-09-02 DIAGNOSIS — I5032 Chronic diastolic (congestive) heart failure: Secondary | ICD-10-CM | POA: Diagnosis not present

## 2014-09-02 DIAGNOSIS — Z6841 Body Mass Index (BMI) 40.0 and over, adult: Secondary | ICD-10-CM | POA: Diagnosis not present

## 2014-09-02 DIAGNOSIS — Z79899 Other long term (current) drug therapy: Secondary | ICD-10-CM | POA: Diagnosis not present

## 2014-09-02 DIAGNOSIS — Z1231 Encounter for screening mammogram for malignant neoplasm of breast: Secondary | ICD-10-CM | POA: Diagnosis not present

## 2014-09-02 DIAGNOSIS — I639 Cerebral infarction, unspecified: Secondary | ICD-10-CM | POA: Diagnosis not present

## 2014-09-02 DIAGNOSIS — E785 Hyperlipidemia, unspecified: Secondary | ICD-10-CM | POA: Diagnosis not present

## 2014-09-25 DIAGNOSIS — Z1231 Encounter for screening mammogram for malignant neoplasm of breast: Secondary | ICD-10-CM | POA: Diagnosis not present

## 2014-10-20 DIAGNOSIS — E1142 Type 2 diabetes mellitus with diabetic polyneuropathy: Secondary | ICD-10-CM | POA: Diagnosis not present

## 2014-10-20 DIAGNOSIS — B351 Tinea unguium: Secondary | ICD-10-CM | POA: Diagnosis not present

## 2014-10-27 DIAGNOSIS — H25813 Combined forms of age-related cataract, bilateral: Secondary | ICD-10-CM | POA: Diagnosis not present

## 2014-10-27 DIAGNOSIS — H524 Presbyopia: Secondary | ICD-10-CM | POA: Diagnosis not present

## 2014-10-27 DIAGNOSIS — H43393 Other vitreous opacities, bilateral: Secondary | ICD-10-CM | POA: Diagnosis not present

## 2014-10-27 DIAGNOSIS — H35373 Puckering of macula, bilateral: Secondary | ICD-10-CM | POA: Diagnosis not present

## 2014-10-27 DIAGNOSIS — H5203 Hypermetropia, bilateral: Secondary | ICD-10-CM | POA: Diagnosis not present

## 2014-10-27 DIAGNOSIS — I10 Essential (primary) hypertension: Secondary | ICD-10-CM | POA: Diagnosis not present

## 2014-10-27 DIAGNOSIS — H43813 Vitreous degeneration, bilateral: Secondary | ICD-10-CM | POA: Diagnosis not present

## 2014-11-12 DIAGNOSIS — D638 Anemia in other chronic diseases classified elsewhere: Secondary | ICD-10-CM | POA: Diagnosis not present

## 2014-11-12 DIAGNOSIS — E538 Deficiency of other specified B group vitamins: Secondary | ICD-10-CM | POA: Diagnosis not present

## 2014-11-12 DIAGNOSIS — Z6841 Body Mass Index (BMI) 40.0 and over, adult: Secondary | ICD-10-CM | POA: Diagnosis not present

## 2014-11-12 DIAGNOSIS — R5381 Other malaise: Secondary | ICD-10-CM | POA: Diagnosis not present

## 2014-11-12 DIAGNOSIS — I5032 Chronic diastolic (congestive) heart failure: Secondary | ICD-10-CM | POA: Diagnosis not present

## 2014-11-16 DIAGNOSIS — E538 Deficiency of other specified B group vitamins: Secondary | ICD-10-CM | POA: Diagnosis not present

## 2014-11-23 DIAGNOSIS — D510 Vitamin B12 deficiency anemia due to intrinsic factor deficiency: Secondary | ICD-10-CM | POA: Diagnosis not present

## 2014-12-07 DIAGNOSIS — D510 Vitamin B12 deficiency anemia due to intrinsic factor deficiency: Secondary | ICD-10-CM | POA: Diagnosis not present

## 2014-12-09 DIAGNOSIS — D519 Vitamin B12 deficiency anemia, unspecified: Secondary | ICD-10-CM | POA: Diagnosis not present

## 2014-12-09 DIAGNOSIS — E785 Hyperlipidemia, unspecified: Secondary | ICD-10-CM | POA: Diagnosis not present

## 2014-12-09 DIAGNOSIS — E1140 Type 2 diabetes mellitus with diabetic neuropathy, unspecified: Secondary | ICD-10-CM | POA: Diagnosis not present

## 2014-12-09 DIAGNOSIS — I110 Hypertensive heart disease with heart failure: Secondary | ICD-10-CM | POA: Diagnosis not present

## 2014-12-09 DIAGNOSIS — Z9181 History of falling: Secondary | ICD-10-CM | POA: Diagnosis not present

## 2014-12-09 DIAGNOSIS — Z6841 Body Mass Index (BMI) 40.0 and over, adult: Secondary | ICD-10-CM | POA: Diagnosis not present

## 2014-12-09 DIAGNOSIS — I639 Cerebral infarction, unspecified: Secondary | ICD-10-CM | POA: Diagnosis not present

## 2014-12-09 DIAGNOSIS — Z23 Encounter for immunization: Secondary | ICD-10-CM | POA: Diagnosis not present

## 2014-12-09 DIAGNOSIS — Z1389 Encounter for screening for other disorder: Secondary | ICD-10-CM | POA: Diagnosis not present

## 2014-12-14 DIAGNOSIS — D510 Vitamin B12 deficiency anemia due to intrinsic factor deficiency: Secondary | ICD-10-CM | POA: Diagnosis not present

## 2015-01-18 DIAGNOSIS — D519 Vitamin B12 deficiency anemia, unspecified: Secondary | ICD-10-CM | POA: Diagnosis not present

## 2015-02-19 DIAGNOSIS — D519 Vitamin B12 deficiency anemia, unspecified: Secondary | ICD-10-CM | POA: Diagnosis not present

## 2015-03-17 DIAGNOSIS — E1140 Type 2 diabetes mellitus with diabetic neuropathy, unspecified: Secondary | ICD-10-CM | POA: Diagnosis not present

## 2015-03-17 DIAGNOSIS — Z6841 Body Mass Index (BMI) 40.0 and over, adult: Secondary | ICD-10-CM | POA: Diagnosis not present

## 2015-03-17 DIAGNOSIS — I110 Hypertensive heart disease with heart failure: Secondary | ICD-10-CM | POA: Diagnosis not present

## 2015-03-17 DIAGNOSIS — D519 Vitamin B12 deficiency anemia, unspecified: Secondary | ICD-10-CM | POA: Diagnosis not present

## 2015-03-17 DIAGNOSIS — E785 Hyperlipidemia, unspecified: Secondary | ICD-10-CM | POA: Diagnosis not present

## 2015-03-17 DIAGNOSIS — I639 Cerebral infarction, unspecified: Secondary | ICD-10-CM | POA: Diagnosis not present

## 2015-03-22 DIAGNOSIS — D510 Vitamin B12 deficiency anemia due to intrinsic factor deficiency: Secondary | ICD-10-CM | POA: Diagnosis not present

## 2015-04-23 DIAGNOSIS — D510 Vitamin B12 deficiency anemia due to intrinsic factor deficiency: Secondary | ICD-10-CM | POA: Diagnosis not present

## 2015-05-06 DIAGNOSIS — B351 Tinea unguium: Secondary | ICD-10-CM | POA: Diagnosis not present

## 2015-05-06 DIAGNOSIS — E1142 Type 2 diabetes mellitus with diabetic polyneuropathy: Secondary | ICD-10-CM | POA: Diagnosis not present

## 2015-06-24 DIAGNOSIS — Z6841 Body Mass Index (BMI) 40.0 and over, adult: Secondary | ICD-10-CM | POA: Diagnosis not present

## 2015-06-24 DIAGNOSIS — E1140 Type 2 diabetes mellitus with diabetic neuropathy, unspecified: Secondary | ICD-10-CM | POA: Diagnosis not present

## 2015-06-24 DIAGNOSIS — E785 Hyperlipidemia, unspecified: Secondary | ICD-10-CM | POA: Diagnosis not present

## 2015-06-24 DIAGNOSIS — D519 Vitamin B12 deficiency anemia, unspecified: Secondary | ICD-10-CM | POA: Diagnosis not present

## 2015-06-24 DIAGNOSIS — I110 Hypertensive heart disease with heart failure: Secondary | ICD-10-CM | POA: Diagnosis not present

## 2015-06-24 DIAGNOSIS — I639 Cerebral infarction, unspecified: Secondary | ICD-10-CM | POA: Diagnosis not present

## 2015-08-17 DIAGNOSIS — B351 Tinea unguium: Secondary | ICD-10-CM | POA: Insufficient documentation

## 2015-09-23 DIAGNOSIS — Z6841 Body Mass Index (BMI) 40.0 and over, adult: Secondary | ICD-10-CM | POA: Diagnosis not present

## 2015-09-23 DIAGNOSIS — Z1231 Encounter for screening mammogram for malignant neoplasm of breast: Secondary | ICD-10-CM | POA: Diagnosis not present

## 2015-09-23 DIAGNOSIS — I639 Cerebral infarction, unspecified: Secondary | ICD-10-CM | POA: Diagnosis not present

## 2015-09-23 DIAGNOSIS — E1140 Type 2 diabetes mellitus with diabetic neuropathy, unspecified: Secondary | ICD-10-CM | POA: Diagnosis not present

## 2015-09-23 DIAGNOSIS — I110 Hypertensive heart disease with heart failure: Secondary | ICD-10-CM | POA: Diagnosis not present

## 2015-09-23 DIAGNOSIS — D519 Vitamin B12 deficiency anemia, unspecified: Secondary | ICD-10-CM | POA: Diagnosis not present

## 2015-09-23 DIAGNOSIS — E785 Hyperlipidemia, unspecified: Secondary | ICD-10-CM | POA: Diagnosis not present

## 2015-10-05 DIAGNOSIS — Z1231 Encounter for screening mammogram for malignant neoplasm of breast: Secondary | ICD-10-CM | POA: Diagnosis not present

## 2015-11-30 DIAGNOSIS — B351 Tinea unguium: Secondary | ICD-10-CM | POA: Diagnosis not present

## 2015-11-30 DIAGNOSIS — E1142 Type 2 diabetes mellitus with diabetic polyneuropathy: Secondary | ICD-10-CM | POA: Diagnosis not present

## 2015-11-30 HISTORY — DX: Type 2 diabetes mellitus with diabetic polyneuropathy: E11.42

## 2015-12-07 DIAGNOSIS — H5203 Hypermetropia, bilateral: Secondary | ICD-10-CM | POA: Diagnosis not present

## 2015-12-07 DIAGNOSIS — I10 Essential (primary) hypertension: Secondary | ICD-10-CM | POA: Diagnosis not present

## 2015-12-07 DIAGNOSIS — H524 Presbyopia: Secondary | ICD-10-CM | POA: Diagnosis not present

## 2015-12-07 DIAGNOSIS — H43813 Vitreous degeneration, bilateral: Secondary | ICD-10-CM | POA: Diagnosis not present

## 2015-12-07 DIAGNOSIS — E119 Type 2 diabetes mellitus without complications: Secondary | ICD-10-CM | POA: Diagnosis not present

## 2015-12-30 DIAGNOSIS — E785 Hyperlipidemia, unspecified: Secondary | ICD-10-CM | POA: Diagnosis not present

## 2015-12-30 DIAGNOSIS — Z9181 History of falling: Secondary | ICD-10-CM | POA: Diagnosis not present

## 2015-12-30 DIAGNOSIS — D519 Vitamin B12 deficiency anemia, unspecified: Secondary | ICD-10-CM | POA: Diagnosis not present

## 2015-12-30 DIAGNOSIS — Z1389 Encounter for screening for other disorder: Secondary | ICD-10-CM | POA: Diagnosis not present

## 2015-12-30 DIAGNOSIS — I110 Hypertensive heart disease with heart failure: Secondary | ICD-10-CM | POA: Diagnosis not present

## 2015-12-30 DIAGNOSIS — Z6841 Body Mass Index (BMI) 40.0 and over, adult: Secondary | ICD-10-CM | POA: Diagnosis not present

## 2015-12-30 DIAGNOSIS — E1140 Type 2 diabetes mellitus with diabetic neuropathy, unspecified: Secondary | ICD-10-CM | POA: Diagnosis not present

## 2015-12-30 DIAGNOSIS — I639 Cerebral infarction, unspecified: Secondary | ICD-10-CM | POA: Diagnosis not present

## 2016-02-27 DIAGNOSIS — M25561 Pain in right knee: Secondary | ICD-10-CM | POA: Diagnosis not present

## 2016-02-29 DIAGNOSIS — Z6839 Body mass index (BMI) 39.0-39.9, adult: Secondary | ICD-10-CM | POA: Diagnosis not present

## 2016-02-29 DIAGNOSIS — S8001XA Contusion of right knee, initial encounter: Secondary | ICD-10-CM | POA: Diagnosis not present

## 2016-02-29 DIAGNOSIS — M25561 Pain in right knee: Secondary | ICD-10-CM | POA: Diagnosis not present

## 2016-02-29 DIAGNOSIS — M1711 Unilateral primary osteoarthritis, right knee: Secondary | ICD-10-CM | POA: Diagnosis not present

## 2016-04-05 DIAGNOSIS — D519 Vitamin B12 deficiency anemia, unspecified: Secondary | ICD-10-CM | POA: Diagnosis not present

## 2016-04-05 DIAGNOSIS — I639 Cerebral infarction, unspecified: Secondary | ICD-10-CM | POA: Diagnosis not present

## 2016-04-05 DIAGNOSIS — Z6839 Body mass index (BMI) 39.0-39.9, adult: Secondary | ICD-10-CM | POA: Diagnosis not present

## 2016-04-05 DIAGNOSIS — E785 Hyperlipidemia, unspecified: Secondary | ICD-10-CM | POA: Diagnosis not present

## 2016-04-05 DIAGNOSIS — E1140 Type 2 diabetes mellitus with diabetic neuropathy, unspecified: Secondary | ICD-10-CM | POA: Diagnosis not present

## 2016-04-05 DIAGNOSIS — I110 Hypertensive heart disease with heart failure: Secondary | ICD-10-CM | POA: Diagnosis not present

## 2016-04-11 DIAGNOSIS — S8001XA Contusion of right knee, initial encounter: Secondary | ICD-10-CM | POA: Diagnosis not present

## 2016-04-11 DIAGNOSIS — M1711 Unilateral primary osteoarthritis, right knee: Secondary | ICD-10-CM | POA: Diagnosis not present

## 2016-07-10 DIAGNOSIS — Z6841 Body Mass Index (BMI) 40.0 and over, adult: Secondary | ICD-10-CM | POA: Diagnosis not present

## 2016-07-10 DIAGNOSIS — E1140 Type 2 diabetes mellitus with diabetic neuropathy, unspecified: Secondary | ICD-10-CM | POA: Diagnosis not present

## 2016-07-10 DIAGNOSIS — I639 Cerebral infarction, unspecified: Secondary | ICD-10-CM | POA: Diagnosis not present

## 2016-07-10 DIAGNOSIS — D519 Vitamin B12 deficiency anemia, unspecified: Secondary | ICD-10-CM | POA: Diagnosis not present

## 2016-07-10 DIAGNOSIS — I110 Hypertensive heart disease with heart failure: Secondary | ICD-10-CM | POA: Diagnosis not present

## 2016-07-10 DIAGNOSIS — E785 Hyperlipidemia, unspecified: Secondary | ICD-10-CM | POA: Diagnosis not present

## 2016-07-18 DIAGNOSIS — E1142 Type 2 diabetes mellitus with diabetic polyneuropathy: Secondary | ICD-10-CM | POA: Diagnosis not present

## 2016-07-18 DIAGNOSIS — B351 Tinea unguium: Secondary | ICD-10-CM | POA: Diagnosis not present

## 2016-07-28 DIAGNOSIS — I5032 Chronic diastolic (congestive) heart failure: Secondary | ICD-10-CM

## 2016-07-28 DIAGNOSIS — I209 Angina pectoris, unspecified: Secondary | ICD-10-CM | POA: Insufficient documentation

## 2016-07-28 DIAGNOSIS — I110 Hypertensive heart disease with heart failure: Secondary | ICD-10-CM

## 2016-07-28 HISTORY — DX: Hypertensive heart disease with heart failure: I11.0

## 2016-07-28 HISTORY — DX: Chronic diastolic (congestive) heart failure: I50.32

## 2016-07-28 HISTORY — DX: Angina pectoris, unspecified: I20.9

## 2016-07-31 DIAGNOSIS — I110 Hypertensive heart disease with heart failure: Secondary | ICD-10-CM | POA: Diagnosis not present

## 2016-07-31 DIAGNOSIS — I209 Angina pectoris, unspecified: Secondary | ICD-10-CM | POA: Diagnosis not present

## 2016-07-31 DIAGNOSIS — I5032 Chronic diastolic (congestive) heart failure: Secondary | ICD-10-CM | POA: Diagnosis not present

## 2016-10-03 DIAGNOSIS — Z1231 Encounter for screening mammogram for malignant neoplasm of breast: Secondary | ICD-10-CM | POA: Diagnosis not present

## 2016-10-03 DIAGNOSIS — Z1389 Encounter for screening for other disorder: Secondary | ICD-10-CM | POA: Diagnosis not present

## 2016-10-03 DIAGNOSIS — N959 Unspecified menopausal and perimenopausal disorder: Secondary | ICD-10-CM | POA: Diagnosis not present

## 2016-10-03 DIAGNOSIS — E785 Hyperlipidemia, unspecified: Secondary | ICD-10-CM | POA: Diagnosis not present

## 2016-10-03 DIAGNOSIS — Z9181 History of falling: Secondary | ICD-10-CM | POA: Diagnosis not present

## 2016-10-03 DIAGNOSIS — Z6841 Body Mass Index (BMI) 40.0 and over, adult: Secondary | ICD-10-CM | POA: Diagnosis not present

## 2016-10-03 DIAGNOSIS — Z136 Encounter for screening for cardiovascular disorders: Secondary | ICD-10-CM | POA: Diagnosis not present

## 2016-10-03 DIAGNOSIS — Z1211 Encounter for screening for malignant neoplasm of colon: Secondary | ICD-10-CM | POA: Diagnosis not present

## 2016-10-03 DIAGNOSIS — Z0000 Encounter for general adult medical examination without abnormal findings: Secondary | ICD-10-CM | POA: Diagnosis not present

## 2016-10-11 DIAGNOSIS — N959 Unspecified menopausal and perimenopausal disorder: Secondary | ICD-10-CM | POA: Diagnosis not present

## 2016-10-11 DIAGNOSIS — Z1231 Encounter for screening mammogram for malignant neoplasm of breast: Secondary | ICD-10-CM | POA: Diagnosis not present

## 2016-10-11 DIAGNOSIS — M85851 Other specified disorders of bone density and structure, right thigh: Secondary | ICD-10-CM | POA: Diagnosis not present

## 2016-10-16 DIAGNOSIS — I110 Hypertensive heart disease with heart failure: Secondary | ICD-10-CM | POA: Diagnosis not present

## 2016-10-16 DIAGNOSIS — E1140 Type 2 diabetes mellitus with diabetic neuropathy, unspecified: Secondary | ICD-10-CM | POA: Diagnosis not present

## 2016-10-16 DIAGNOSIS — Z6841 Body Mass Index (BMI) 40.0 and over, adult: Secondary | ICD-10-CM | POA: Diagnosis not present

## 2016-10-16 DIAGNOSIS — D519 Vitamin B12 deficiency anemia, unspecified: Secondary | ICD-10-CM | POA: Diagnosis not present

## 2016-10-16 DIAGNOSIS — E785 Hyperlipidemia, unspecified: Secondary | ICD-10-CM | POA: Diagnosis not present

## 2016-10-16 DIAGNOSIS — I639 Cerebral infarction, unspecified: Secondary | ICD-10-CM | POA: Diagnosis not present

## 2016-10-25 DIAGNOSIS — E119 Type 2 diabetes mellitus without complications: Secondary | ICD-10-CM | POA: Diagnosis not present

## 2016-10-26 DIAGNOSIS — R928 Other abnormal and inconclusive findings on diagnostic imaging of breast: Secondary | ICD-10-CM | POA: Diagnosis not present

## 2016-11-27 DIAGNOSIS — E119 Type 2 diabetes mellitus without complications: Secondary | ICD-10-CM | POA: Diagnosis not present

## 2016-12-11 DIAGNOSIS — Z7984 Long term (current) use of oral hypoglycemic drugs: Secondary | ICD-10-CM | POA: Diagnosis not present

## 2016-12-11 DIAGNOSIS — E119 Type 2 diabetes mellitus without complications: Secondary | ICD-10-CM | POA: Diagnosis not present

## 2016-12-11 DIAGNOSIS — I10 Essential (primary) hypertension: Secondary | ICD-10-CM | POA: Diagnosis not present

## 2016-12-11 DIAGNOSIS — H5203 Hypermetropia, bilateral: Secondary | ICD-10-CM | POA: Diagnosis not present

## 2016-12-11 DIAGNOSIS — Z794 Long term (current) use of insulin: Secondary | ICD-10-CM | POA: Diagnosis not present

## 2016-12-11 DIAGNOSIS — H25813 Combined forms of age-related cataract, bilateral: Secondary | ICD-10-CM | POA: Diagnosis not present

## 2016-12-11 DIAGNOSIS — H524 Presbyopia: Secondary | ICD-10-CM | POA: Diagnosis not present

## 2017-01-23 DIAGNOSIS — I639 Cerebral infarction, unspecified: Secondary | ICD-10-CM | POA: Diagnosis not present

## 2017-01-23 DIAGNOSIS — D519 Vitamin B12 deficiency anemia, unspecified: Secondary | ICD-10-CM | POA: Diagnosis not present

## 2017-01-23 DIAGNOSIS — Z6841 Body Mass Index (BMI) 40.0 and over, adult: Secondary | ICD-10-CM | POA: Diagnosis not present

## 2017-01-23 DIAGNOSIS — E785 Hyperlipidemia, unspecified: Secondary | ICD-10-CM | POA: Diagnosis not present

## 2017-01-23 DIAGNOSIS — I110 Hypertensive heart disease with heart failure: Secondary | ICD-10-CM | POA: Diagnosis not present

## 2017-01-23 DIAGNOSIS — Z1389 Encounter for screening for other disorder: Secondary | ICD-10-CM | POA: Diagnosis not present

## 2017-01-23 DIAGNOSIS — E1140 Type 2 diabetes mellitus with diabetic neuropathy, unspecified: Secondary | ICD-10-CM | POA: Diagnosis not present

## 2017-01-23 DIAGNOSIS — Z9181 History of falling: Secondary | ICD-10-CM | POA: Diagnosis not present

## 2017-02-27 DIAGNOSIS — B351 Tinea unguium: Secondary | ICD-10-CM | POA: Diagnosis not present

## 2017-02-27 DIAGNOSIS — E1142 Type 2 diabetes mellitus with diabetic polyneuropathy: Secondary | ICD-10-CM | POA: Diagnosis not present

## 2017-03-09 DIAGNOSIS — E119 Type 2 diabetes mellitus without complications: Secondary | ICD-10-CM | POA: Diagnosis not present

## 2017-04-25 DIAGNOSIS — I110 Hypertensive heart disease with heart failure: Secondary | ICD-10-CM | POA: Diagnosis not present

## 2017-04-25 DIAGNOSIS — I639 Cerebral infarction, unspecified: Secondary | ICD-10-CM | POA: Diagnosis not present

## 2017-04-25 DIAGNOSIS — E1140 Type 2 diabetes mellitus with diabetic neuropathy, unspecified: Secondary | ICD-10-CM | POA: Diagnosis not present

## 2017-04-25 DIAGNOSIS — E785 Hyperlipidemia, unspecified: Secondary | ICD-10-CM | POA: Diagnosis not present

## 2017-04-25 DIAGNOSIS — Z6838 Body mass index (BMI) 38.0-38.9, adult: Secondary | ICD-10-CM | POA: Diagnosis not present

## 2017-04-25 DIAGNOSIS — D519 Vitamin B12 deficiency anemia, unspecified: Secondary | ICD-10-CM | POA: Diagnosis not present

## 2017-05-15 DIAGNOSIS — E119 Type 2 diabetes mellitus without complications: Secondary | ICD-10-CM | POA: Diagnosis not present

## 2017-05-15 DIAGNOSIS — Z6837 Body mass index (BMI) 37.0-37.9, adult: Secondary | ICD-10-CM | POA: Diagnosis not present

## 2017-07-25 DIAGNOSIS — Z6839 Body mass index (BMI) 39.0-39.9, adult: Secondary | ICD-10-CM | POA: Diagnosis not present

## 2017-07-25 DIAGNOSIS — D519 Vitamin B12 deficiency anemia, unspecified: Secondary | ICD-10-CM | POA: Diagnosis not present

## 2017-07-25 DIAGNOSIS — E1140 Type 2 diabetes mellitus with diabetic neuropathy, unspecified: Secondary | ICD-10-CM | POA: Diagnosis not present

## 2017-07-25 DIAGNOSIS — I6381 Other cerebral infarction due to occlusion or stenosis of small artery: Secondary | ICD-10-CM | POA: Diagnosis not present

## 2017-07-25 DIAGNOSIS — E785 Hyperlipidemia, unspecified: Secondary | ICD-10-CM | POA: Diagnosis not present

## 2017-07-25 DIAGNOSIS — I110 Hypertensive heart disease with heart failure: Secondary | ICD-10-CM | POA: Diagnosis not present

## 2017-08-14 DIAGNOSIS — E119 Type 2 diabetes mellitus without complications: Secondary | ICD-10-CM | POA: Diagnosis not present

## 2017-10-17 DIAGNOSIS — E785 Hyperlipidemia, unspecified: Secondary | ICD-10-CM | POA: Diagnosis not present

## 2017-10-17 DIAGNOSIS — Z9181 History of falling: Secondary | ICD-10-CM | POA: Diagnosis not present

## 2017-10-17 DIAGNOSIS — E669 Obesity, unspecified: Secondary | ICD-10-CM | POA: Diagnosis not present

## 2017-10-17 DIAGNOSIS — Z6837 Body mass index (BMI) 37.0-37.9, adult: Secondary | ICD-10-CM | POA: Diagnosis not present

## 2017-10-17 DIAGNOSIS — Z136 Encounter for screening for cardiovascular disorders: Secondary | ICD-10-CM | POA: Diagnosis not present

## 2017-10-17 DIAGNOSIS — Z1331 Encounter for screening for depression: Secondary | ICD-10-CM | POA: Diagnosis not present

## 2017-10-17 DIAGNOSIS — Z1211 Encounter for screening for malignant neoplasm of colon: Secondary | ICD-10-CM | POA: Diagnosis not present

## 2017-10-17 DIAGNOSIS — Z0000 Encounter for general adult medical examination without abnormal findings: Secondary | ICD-10-CM | POA: Diagnosis not present

## 2017-10-26 DIAGNOSIS — E1140 Type 2 diabetes mellitus with diabetic neuropathy, unspecified: Secondary | ICD-10-CM | POA: Diagnosis not present

## 2017-10-30 DIAGNOSIS — Z1231 Encounter for screening mammogram for malignant neoplasm of breast: Secondary | ICD-10-CM | POA: Diagnosis not present

## 2017-10-31 DIAGNOSIS — I6381 Other cerebral infarction due to occlusion or stenosis of small artery: Secondary | ICD-10-CM | POA: Diagnosis not present

## 2017-10-31 DIAGNOSIS — I10 Essential (primary) hypertension: Secondary | ICD-10-CM | POA: Diagnosis not present

## 2017-10-31 DIAGNOSIS — E785 Hyperlipidemia, unspecified: Secondary | ICD-10-CM | POA: Diagnosis not present

## 2017-10-31 DIAGNOSIS — D519 Vitamin B12 deficiency anemia, unspecified: Secondary | ICD-10-CM | POA: Diagnosis not present

## 2017-10-31 DIAGNOSIS — E1140 Type 2 diabetes mellitus with diabetic neuropathy, unspecified: Secondary | ICD-10-CM | POA: Diagnosis not present

## 2017-12-04 DIAGNOSIS — B351 Tinea unguium: Secondary | ICD-10-CM | POA: Diagnosis not present

## 2017-12-04 DIAGNOSIS — E1142 Type 2 diabetes mellitus with diabetic polyneuropathy: Secondary | ICD-10-CM | POA: Diagnosis not present

## 2017-12-25 DIAGNOSIS — B351 Tinea unguium: Secondary | ICD-10-CM | POA: Diagnosis not present

## 2017-12-25 DIAGNOSIS — L600 Ingrowing nail: Secondary | ICD-10-CM | POA: Diagnosis not present

## 2017-12-25 DIAGNOSIS — M79674 Pain in right toe(s): Secondary | ICD-10-CM | POA: Diagnosis not present

## 2018-01-02 DIAGNOSIS — H25813 Combined forms of age-related cataract, bilateral: Secondary | ICD-10-CM | POA: Diagnosis not present

## 2018-01-02 DIAGNOSIS — H524 Presbyopia: Secondary | ICD-10-CM | POA: Diagnosis not present

## 2018-01-02 DIAGNOSIS — H52223 Regular astigmatism, bilateral: Secondary | ICD-10-CM | POA: Diagnosis not present

## 2018-01-02 DIAGNOSIS — I10 Essential (primary) hypertension: Secondary | ICD-10-CM | POA: Diagnosis not present

## 2018-01-02 DIAGNOSIS — Z794 Long term (current) use of insulin: Secondary | ICD-10-CM | POA: Diagnosis not present

## 2018-01-02 DIAGNOSIS — H5203 Hypermetropia, bilateral: Secondary | ICD-10-CM | POA: Diagnosis not present

## 2018-01-02 DIAGNOSIS — Z7984 Long term (current) use of oral hypoglycemic drugs: Secondary | ICD-10-CM | POA: Diagnosis not present

## 2018-01-02 DIAGNOSIS — E119 Type 2 diabetes mellitus without complications: Secondary | ICD-10-CM | POA: Diagnosis not present

## 2018-01-14 DIAGNOSIS — J449 Chronic obstructive pulmonary disease, unspecified: Secondary | ICD-10-CM

## 2018-01-14 DIAGNOSIS — E785 Hyperlipidemia, unspecified: Secondary | ICD-10-CM | POA: Insufficient documentation

## 2018-01-14 HISTORY — DX: Chronic obstructive pulmonary disease, unspecified: J44.9

## 2018-01-14 NOTE — Progress Notes (Signed)
Cardiology Office Note:    Date:  01/15/2018   ID:  [name], DOB Oct 31, 1945, MRN [id number]  PCP:  Paulina Fusi, MD  Cardiologist:  Norman Herrlich, MD    Referring MD: No ref. provider found    ASSESSMENT:    1. Chronic diastolic congestive heart failure (HCC)   2. Hypertensive heart disease with heart failure (HCC)   3. Angina pectoris (HCC)   4. Hyperlipidemia, unspecified hyperlipidemia type   5. Chronic obstructive pulmonary disease, unspecified COPD type (HCC)    PLAN:    In order of problems listed above:  1. Her heart failure is nicely compensated although not on a list of medication she does like a high dose of a loop diuretic 1 to 2 days a week and weighs herself at home sodium restriction is lost over 50 pounds in the last few years purposefully.  She is limited in her ambulation with gait dysfunction but does not have exercise intolerance shortness of breath for usual activities orthopnea edema chest pain palpitation or syncope.  She no longer uses home oxygen.  She will continue her current treatment with a loop diuretic and ACE inhibitor. 2. Stable blood pressure target continue current treatment she also takes hydralazine. 3. Stable no recurrent angina.  At this time I do not think she requires an ischemia evaluation with her stroke she takes clopidogrel for antiplatelet therapy lipid-lowering therapy with a statin and Zetia and nitroglycerin if needed. 4. Stable continue combined treatment high intensity statin and Zetia.  Lipids are at target 5. Improved no longer requires ambulatory oxygen with weight loss   Next appointment: 6 months   Medication Adjustments/Labs and Tests Ordered: Current medicines are reviewed at length with the patient today.  Concerns regarding medicines are outlined above.  Orders Placed This Encounter  Procedures  . EKG 12-Lead   No orders of the defined types were placed in this encounter.   Chief Complaint  Patient  presents with  . Congestive Heart Failure  . Hypertension  . Hyperlipidemia  . Chest Pain    History of Present Illness:    [name] is a 72 y.o. female with a hx of COPD no longer uses ambulatory oxygen,  chronic diastolic heart failure, hypertensive heart disease with heart failure, angina pectoris stroke and hyperlipidemia last seen 07/31/16. Compliance with diet, lifestyle and medications: Yes  She is under intense stress at home with her husband's illness she has done well her weights are stable takes her diuretic 1 to 2 days a week depending on her weight has not had edema orthopnea shortness of breath with usual activities within her abilities with gait dysfunction using a walker no angina palpitation or syncope Past Medical History:  Diagnosis Date  . CHF (congestive heart failure) (HCC)   . Diabetes mellitus without complication (HCC)   . Hyperlipidemia   . Hypertension   . Stroke [facility])     Past Surgical History:  Procedure Laterality Date  . ABDOMINAL HYSTERECTOMY    . APPENDECTOMY    . CARDIAC CATHETERIZATION      Current Medications: Current Meds  Medication Sig  . atorvastatin (LIPITOR) 80 MG tablet Take 1 tablet by mouth daily.  . clopidogrel (PLAVIX) 75 MG tablet Take 1 tablet by mouth daily.  Marland Kitchen ezetimibe (ZETIA) 10 MG tablet Take 1 tablet by mouth daily.  Marland Kitchen gabapentin (NEURONTIN) 300 MG capsule Take 1 capsule by mouth 3 (three) times daily.  Marland Kitchen glimepiride (AMARYL) 4 MG  tablet Take 1 tablet by mouth 2 (two) times daily.  . hydrALAZINE (APRESOLINE) 25 MG tablet Take 1 tablet by mouth daily.  . insulin glargine (LANTUS) 100 UNIT/ML injection Inject into the skin. Sliding scale  . insulin lispro (HUMALOG) 100 UNIT/ML injection Inject into the skin 3 (three) times daily with meals. 30 units in the am, 20 units at lunch, and 20 units with supper  . lisinopril (PRINIVIL,ZESTRIL) 40 MG tablet Take 1 tablet by mouth daily.  . nitroGLYCERIN (NITROSTAT) 0.4 MG SL  tablet Place 1 tablet under the tongue every 5 (five) minutes as needed for chest pain.   Marland Kitchen olmesartan (BENICAR) 40 MG tablet Take 1 tablet by mouth daily.  Marland Kitchen rOPINIRole (REQUIP) 1 MG tablet Take 1 tablet by mouth daily.  Marland Kitchen torsemide (DEMADEX) 100 MG tablet Take 100 mg by mouth once a week.     Allergies:   Patient has no known allergies.   Social History   Socioeconomic History  . Marital status: Married    Spouse name: Not on file  . Number of children: Not on file  . Years of education: Not on file  . Highest education level: Not on file  Occupational History  . Not on file  Social Needs  . Financial resource strain: Not on file  . Food insecurity:    Worry: Not on file    Inability: Not on file  . Transportation needs:    Medical: Not on file    Non-medical: Not on file  Tobacco Use  . Smoking status: Never Smoker  . Smokeless tobacco: Never Used  Substance and Sexual Activity  . Alcohol use: Not Currently  . Drug use: Never  . Sexual activity: Not on file  Lifestyle  . Physical activity:    Days per week: Not on file    Minutes per session: Not on file  . Stress: Not on file  Relationships  . Social connections:    Talks on phone: Not on file    Gets together: Not on file    Attends religious service: Not on file    Active member of club or organization: Not on file    Attends meetings of clubs or organizations: Not on file    Relationship status: Not on file  Other Topics Concern  . Not on file  Social History Narrative  . Not on file     Family History: The patient's family history includes CAD in her sister and sister; Prostate cancer in her father; Stroke in her mother; Valvular heart disease in her sister. ROS:   Please see the history of present illness.    All other systems reviewed and are negative.  EKGs/Labs/Other Studies Reviewed:    The following studies were reviewed today:  EKG:  EKG ordered today.  The ekg ordered today demonstrates  SRTH LAHB   Recent Labs:   10/31/17 Chol 230 HDL 47 LDL 155 cr 0.84 TSH 1.95   Physical Exam:    VS:  BP 124/68 (BP Location: Left Arm, Patient Position: Sitting, Cuff Size: Large)   Pulse (!) 102   Ht 5' 3.5" (1.613 m)   Wt 204 lb (92.5 kg)   SpO2 97%   BMI 35.57 kg/m     Wt Readings from Last 3 Encounters:  01/15/18 204 lb (92.5 kg)     GEN:  Well nourished, well developed in no acute distress HEENT: Normal NECK: No JVD; No carotid bruits LYMPHATICS: No lymphadenopathy CARDIAC: distant HS  RRR, no murmurs, rubs, gallops RESPIRATORY:  Clear to auscultation without rales, wheezing or rhonchi  ABDOMEN: Soft, non-tender, non-distended MUSCULOSKELETAL:  No edema; No deformity  SKIN: Warm and dry NEUROLOGIC:  Alert and oriented x 3 PSYCHIATRIC:  Normal affect    Signed, Norman Herrlich, MD  01/15/2018 9:55 AM    [name]

## 2018-01-15 ENCOUNTER — Ambulatory Visit (INDEPENDENT_AMBULATORY_CARE_PROVIDER_SITE_OTHER): Payer: Medicare Other | Admitting: Cardiology

## 2018-01-15 ENCOUNTER — Encounter: Payer: Self-pay | Admitting: Cardiology

## 2018-01-15 VITALS — BP 124/68 | HR 102 | Ht 63.5 in | Wt 204.0 lb

## 2018-01-15 DIAGNOSIS — I5032 Chronic diastolic (congestive) heart failure: Secondary | ICD-10-CM | POA: Diagnosis not present

## 2018-01-15 DIAGNOSIS — J449 Chronic obstructive pulmonary disease, unspecified: Secondary | ICD-10-CM

## 2018-01-15 DIAGNOSIS — I209 Angina pectoris, unspecified: Secondary | ICD-10-CM

## 2018-01-15 DIAGNOSIS — I110 Hypertensive heart disease with heart failure: Secondary | ICD-10-CM | POA: Diagnosis not present

## 2018-01-15 DIAGNOSIS — E785 Hyperlipidemia, unspecified: Secondary | ICD-10-CM | POA: Diagnosis not present

## 2018-01-15 NOTE — Patient Instructions (Signed)
Medication Instructions:  Your physician recommends that you continue on your current medications as directed. Please refer to the Current Medication list given to you today.   Labwork: None  Testing/Procedures: You had an EKG today.   Follow-Up: Your physician wants you to follow-up in: 6 months. You will receive a reminder letter in the mail two months in advance. If you don't receive a letter, please call our office to schedule the follow-up appointment.   If you need a refill on your cardiac medications before your next appointment, please call your pharmacy.   Thank you for choosing [facility]! Alasdair Kleve, RN [phone]    

## 2018-02-01 DIAGNOSIS — I110 Hypertensive heart disease with heart failure: Secondary | ICD-10-CM | POA: Diagnosis not present

## 2018-02-01 DIAGNOSIS — E785 Hyperlipidemia, unspecified: Secondary | ICD-10-CM | POA: Diagnosis not present

## 2018-02-01 DIAGNOSIS — I6381 Other cerebral infarction due to occlusion or stenosis of small artery: Secondary | ICD-10-CM | POA: Diagnosis not present

## 2018-02-01 DIAGNOSIS — D519 Vitamin B12 deficiency anemia, unspecified: Secondary | ICD-10-CM | POA: Diagnosis not present

## 2018-02-01 DIAGNOSIS — E1140 Type 2 diabetes mellitus with diabetic neuropathy, unspecified: Secondary | ICD-10-CM | POA: Diagnosis not present

## 2018-02-01 DIAGNOSIS — R399 Unspecified symptoms and signs involving the genitourinary system: Secondary | ICD-10-CM | POA: Diagnosis not present

## 2018-02-05 DIAGNOSIS — G2581 Restless legs syndrome: Secondary | ICD-10-CM | POA: Diagnosis not present

## 2018-02-05 DIAGNOSIS — I509 Heart failure, unspecified: Secondary | ICD-10-CM | POA: Diagnosis not present

## 2018-02-05 DIAGNOSIS — K869 Disease of pancreas, unspecified: Secondary | ICD-10-CM | POA: Diagnosis not present

## 2018-02-05 DIAGNOSIS — Z79899 Other long term (current) drug therapy: Secondary | ICD-10-CM | POA: Diagnosis not present

## 2018-02-05 DIAGNOSIS — I110 Hypertensive heart disease with heart failure: Secondary | ICD-10-CM | POA: Diagnosis present

## 2018-02-05 DIAGNOSIS — N3000 Acute cystitis without hematuria: Secondary | ICD-10-CM | POA: Diagnosis not present

## 2018-02-05 DIAGNOSIS — E785 Hyperlipidemia, unspecified: Secondary | ICD-10-CM | POA: Diagnosis not present

## 2018-02-05 DIAGNOSIS — Z794 Long term (current) use of insulin: Secondary | ICD-10-CM | POA: Diagnosis not present

## 2018-02-05 DIAGNOSIS — I10 Essential (primary) hypertension: Secondary | ICD-10-CM | POA: Diagnosis not present

## 2018-02-05 DIAGNOSIS — N764 Abscess of vulva: Secondary | ICD-10-CM | POA: Diagnosis not present

## 2018-02-05 DIAGNOSIS — L0231 Cutaneous abscess of buttock: Secondary | ICD-10-CM | POA: Diagnosis present

## 2018-02-05 DIAGNOSIS — I2510 Atherosclerotic heart disease of native coronary artery without angina pectoris: Secondary | ICD-10-CM | POA: Diagnosis not present

## 2018-02-05 DIAGNOSIS — E119 Type 2 diabetes mellitus without complications: Secondary | ICD-10-CM | POA: Diagnosis not present

## 2018-02-05 DIAGNOSIS — J449 Chronic obstructive pulmonary disease, unspecified: Secondary | ICD-10-CM | POA: Diagnosis not present

## 2018-02-05 DIAGNOSIS — I252 Old myocardial infarction: Secondary | ICD-10-CM | POA: Diagnosis not present

## 2018-02-05 DIAGNOSIS — Z8673 Personal history of transient ischemic attack (TIA), and cerebral infarction without residual deficits: Secondary | ICD-10-CM | POA: Diagnosis not present

## 2018-02-05 DIAGNOSIS — Z741 Need for assistance with personal care: Secondary | ICD-10-CM | POA: Diagnosis not present

## 2018-02-05 DIAGNOSIS — I5032 Chronic diastolic (congestive) heart failure: Secondary | ICD-10-CM | POA: Diagnosis not present

## 2018-02-05 DIAGNOSIS — I679 Cerebrovascular disease, unspecified: Secondary | ICD-10-CM | POA: Diagnosis present

## 2018-02-05 DIAGNOSIS — E1140 Type 2 diabetes mellitus with diabetic neuropathy, unspecified: Secondary | ICD-10-CM | POA: Diagnosis not present

## 2018-02-05 DIAGNOSIS — R5381 Other malaise: Secondary | ICD-10-CM | POA: Diagnosis present

## 2018-02-05 DIAGNOSIS — R262 Difficulty in walking, not elsewhere classified: Secondary | ICD-10-CM | POA: Diagnosis not present

## 2018-02-05 DIAGNOSIS — N732 Unspecified parametritis and pelvic cellulitis: Secondary | ICD-10-CM | POA: Diagnosis present

## 2018-02-05 DIAGNOSIS — E11622 Type 2 diabetes mellitus with other skin ulcer: Secondary | ICD-10-CM | POA: Diagnosis not present

## 2018-02-05 DIAGNOSIS — L02215 Cutaneous abscess of perineum: Secondary | ICD-10-CM | POA: Diagnosis not present

## 2018-02-05 DIAGNOSIS — I639 Cerebral infarction, unspecified: Secondary | ICD-10-CM | POA: Diagnosis not present

## 2018-02-05 DIAGNOSIS — Z7902 Long term (current) use of antithrombotics/antiplatelets: Secondary | ICD-10-CM | POA: Diagnosis not present

## 2018-02-05 DIAGNOSIS — N7689 Other specified inflammation of vagina and vulva: Secondary | ICD-10-CM | POA: Diagnosis not present

## 2018-02-05 DIAGNOSIS — N390 Urinary tract infection, site not specified: Secondary | ICD-10-CM | POA: Diagnosis not present

## 2018-02-05 DIAGNOSIS — L080 Pyoderma: Secondary | ICD-10-CM | POA: Diagnosis not present

## 2018-02-05 DIAGNOSIS — E1142 Type 2 diabetes mellitus with diabetic polyneuropathy: Secondary | ICD-10-CM | POA: Diagnosis present

## 2018-02-05 DIAGNOSIS — B9689 Other specified bacterial agents as the cause of diseases classified elsewhere: Secondary | ICD-10-CM | POA: Diagnosis not present

## 2018-02-05 DIAGNOSIS — E1169 Type 2 diabetes mellitus with other specified complication: Secondary | ICD-10-CM | POA: Diagnosis not present

## 2018-02-05 DIAGNOSIS — B3749 Other urogenital candidiasis: Secondary | ICD-10-CM | POA: Diagnosis present

## 2018-02-07 DIAGNOSIS — N390 Urinary tract infection, site not specified: Secondary | ICD-10-CM

## 2018-02-08 DIAGNOSIS — I679 Cerebrovascular disease, unspecified: Secondary | ICD-10-CM | POA: Diagnosis not present

## 2018-02-08 DIAGNOSIS — G2581 Restless legs syndrome: Secondary | ICD-10-CM | POA: Diagnosis not present

## 2018-02-08 DIAGNOSIS — Z741 Need for assistance with personal care: Secondary | ICD-10-CM | POA: Diagnosis not present

## 2018-02-08 DIAGNOSIS — I10 Essential (primary) hypertension: Secondary | ICD-10-CM | POA: Diagnosis not present

## 2018-02-08 DIAGNOSIS — L080 Pyoderma: Secondary | ICD-10-CM | POA: Diagnosis not present

## 2018-02-08 DIAGNOSIS — L02215 Cutaneous abscess of perineum: Secondary | ICD-10-CM | POA: Diagnosis not present

## 2018-02-08 DIAGNOSIS — E1151 Type 2 diabetes mellitus with diabetic peripheral angiopathy without gangrene: Secondary | ICD-10-CM | POA: Diagnosis not present

## 2018-02-08 DIAGNOSIS — E1169 Type 2 diabetes mellitus with other specified complication: Secondary | ICD-10-CM | POA: Diagnosis not present

## 2018-02-08 DIAGNOSIS — E119 Type 2 diabetes mellitus without complications: Secondary | ICD-10-CM | POA: Diagnosis not present

## 2018-02-08 DIAGNOSIS — N390 Urinary tract infection, site not specified: Secondary | ICD-10-CM | POA: Diagnosis not present

## 2018-02-08 DIAGNOSIS — I2510 Atherosclerotic heart disease of native coronary artery without angina pectoris: Secondary | ICD-10-CM | POA: Diagnosis not present

## 2018-02-08 DIAGNOSIS — E785 Hyperlipidemia, unspecified: Secondary | ICD-10-CM | POA: Diagnosis not present

## 2018-02-08 DIAGNOSIS — I509 Heart failure, unspecified: Secondary | ICD-10-CM | POA: Diagnosis not present

## 2018-02-08 DIAGNOSIS — R262 Difficulty in walking, not elsewhere classified: Secondary | ICD-10-CM | POA: Diagnosis not present

## 2018-02-08 DIAGNOSIS — I639 Cerebral infarction, unspecified: Secondary | ICD-10-CM | POA: Diagnosis not present

## 2018-02-08 DIAGNOSIS — I119 Hypertensive heart disease without heart failure: Secondary | ICD-10-CM | POA: Diagnosis not present

## 2018-02-08 DIAGNOSIS — E1140 Type 2 diabetes mellitus with diabetic neuropathy, unspecified: Secondary | ICD-10-CM | POA: Diagnosis not present

## 2018-02-08 DIAGNOSIS — Z8673 Personal history of transient ischemic attack (TIA), and cerebral infarction without residual deficits: Secondary | ICD-10-CM | POA: Diagnosis not present

## 2018-02-08 DIAGNOSIS — Z794 Long term (current) use of insulin: Secondary | ICD-10-CM | POA: Diagnosis not present

## 2018-02-08 DIAGNOSIS — E11622 Type 2 diabetes mellitus with other skin ulcer: Secondary | ICD-10-CM | POA: Diagnosis not present

## 2018-02-18 DIAGNOSIS — R262 Difficulty in walking, not elsewhere classified: Secondary | ICD-10-CM | POA: Diagnosis not present

## 2018-02-18 DIAGNOSIS — L02215 Cutaneous abscess of perineum: Secondary | ICD-10-CM | POA: Diagnosis not present

## 2018-02-18 DIAGNOSIS — I119 Hypertensive heart disease without heart failure: Secondary | ICD-10-CM | POA: Diagnosis not present

## 2018-02-18 DIAGNOSIS — E1151 Type 2 diabetes mellitus with diabetic peripheral angiopathy without gangrene: Secondary | ICD-10-CM | POA: Diagnosis not present

## 2018-02-27 DIAGNOSIS — E1142 Type 2 diabetes mellitus with diabetic polyneuropathy: Secondary | ICD-10-CM | POA: Diagnosis not present

## 2018-02-27 DIAGNOSIS — Z7902 Long term (current) use of antithrombotics/antiplatelets: Secondary | ICD-10-CM | POA: Diagnosis not present

## 2018-02-27 DIAGNOSIS — Z794 Long term (current) use of insulin: Secondary | ICD-10-CM | POA: Diagnosis not present

## 2018-02-27 DIAGNOSIS — L0231 Cutaneous abscess of buttock: Secondary | ICD-10-CM | POA: Diagnosis not present

## 2018-02-27 DIAGNOSIS — I509 Heart failure, unspecified: Secondary | ICD-10-CM | POA: Diagnosis not present

## 2018-02-27 DIAGNOSIS — M810 Age-related osteoporosis without current pathological fracture: Secondary | ICD-10-CM | POA: Diagnosis not present

## 2018-02-27 DIAGNOSIS — I2510 Atherosclerotic heart disease of native coronary artery without angina pectoris: Secondary | ICD-10-CM | POA: Diagnosis not present

## 2018-02-27 DIAGNOSIS — Z8673 Personal history of transient ischemic attack (TIA), and cerebral infarction without residual deficits: Secondary | ICD-10-CM | POA: Diagnosis not present

## 2018-02-27 DIAGNOSIS — I110 Hypertensive heart disease with heart failure: Secondary | ICD-10-CM | POA: Diagnosis not present

## 2018-02-27 DIAGNOSIS — Z9181 History of falling: Secondary | ICD-10-CM | POA: Diagnosis not present

## 2018-02-27 DIAGNOSIS — L02215 Cutaneous abscess of perineum: Secondary | ICD-10-CM | POA: Diagnosis not present

## 2018-02-27 DIAGNOSIS — N764 Abscess of vulva: Secondary | ICD-10-CM | POA: Diagnosis not present

## 2018-02-27 DIAGNOSIS — Z87440 Personal history of urinary (tract) infections: Secondary | ICD-10-CM | POA: Diagnosis not present

## 2018-02-28 ENCOUNTER — Other Ambulatory Visit: Payer: Self-pay | Admitting: *Deleted

## 2018-02-28 ENCOUNTER — Other Ambulatory Visit: Payer: Self-pay

## 2018-02-28 DIAGNOSIS — L02215 Cutaneous abscess of perineum: Secondary | ICD-10-CM | POA: Diagnosis not present

## 2018-02-28 DIAGNOSIS — E1142 Type 2 diabetes mellitus with diabetic polyneuropathy: Secondary | ICD-10-CM | POA: Diagnosis not present

## 2018-02-28 DIAGNOSIS — I509 Heart failure, unspecified: Secondary | ICD-10-CM | POA: Diagnosis not present

## 2018-02-28 DIAGNOSIS — M810 Age-related osteoporosis without current pathological fracture: Secondary | ICD-10-CM | POA: Diagnosis not present

## 2018-02-28 DIAGNOSIS — I2510 Atherosclerotic heart disease of native coronary artery without angina pectoris: Secondary | ICD-10-CM | POA: Diagnosis not present

## 2018-02-28 DIAGNOSIS — I110 Hypertensive heart disease with heart failure: Secondary | ICD-10-CM | POA: Diagnosis not present

## 2018-02-28 NOTE — Patient Outreach (Signed)
  [facility]) Care Management  02/28/2018  [name] 1946-01-10 [id number]   Care coordination   [facility] RN CM missed a call from the pt A voice message was left by the [facility] home health RN, Marcelino Duster B to updated CM during her visit with [name] the dressing was changed, call to her MD office to get her and appointment for 03/01/18 and to request a referral to and endocrinologist.  CM returned a call to [facility] and thanked her for the assistance with [name]'s care. CM called to updated the assigned [facility] RN CM, A  Cook.   Plan Continue to collaborate with home health RN and [facility] RN CM prn    [place] L. Noelle Penner, RN, BSN, CCM [facility] Telephonic Care Management Care Coordinator Office number [phone] Mobile number [phone]  Main [facility] number [phone] Fax number [phone]

## 2018-02-28 NOTE — Patient Outreach (Addendum)
[facility]) Care Management  02/28/2018  [name] 05-30-1945 [id number]   EMMI-general discharge (02/05/18 to 02/08/18 d/c to [facility])  RED ON EMMI ALERT Day # 1 Date: 02/27/18 1012 Red Alert Reason: got discharge instructions? I don't know Insurance: Tricare for life and medicare coverage   Outreach attempt # 1a unsuccessful home number busy  1b- [facility] RN CM returned a call to Mrs Clendenen, successful outreach to the home number  Patient is able to verify HIPAA [facility] Care Management RN reviewed and addressed red alert with patient Transition of care services noted to be completed by primary care MD office staff- [facility] internal medicine    Red Alert- Mrs Grays reports she does have her discharge instructions from [facility] sn[facility] and has read them She reports She has been seen already by [facility] home health RN for wound dressing changes and PT  She confirms she received medications from the facility and had medications at home  She reports her incision is being packed qd Mrs Mahone questioned if she needed further home antibiotics. CM inquired if she had taken and completed antibiotics at the facility for 7-14 days. She confirms she had and was informed all her antibiotics had been finished. She reports her last antibiotics were taken on Monday 02/26/18  CM reviewed s/s of infection and Mrs Amrein reports the site is tender, minimal bleeding at the bottom of the incision  [facility] RN visits today at 12 noon Mrs Elwell states the [facility] RN applies saline when removing her packing and dressing  She confirms she has called the primary MD, Tomasa Blase since her d/c and was informed that she would need to have her wound managed by the wound care MD.   She also reports concerns with her DM not being controlled in the snf and now at home . She shares cbg levels from 20-300s. She reports she is adjusting her Lantus herself to try to prevent hypoglycemia and its s/s of blurred vision  and  "feels like I'm fading away."  Her cbg this am was 81, she just ate and will be re checking it after speaking with CM She reports her last a1c was ok  IN Epic the last A1C listed on 02/01/18 was 12.7   She discussed an episode in snf when her night cbg was in the 200s, she was not given her insulin hs dose and then in the am the cbg was in the 80s She also discussed episode of a low BP in the snf and her BP medication being held    Social: Mrs Zabawa lives with her husband and is independent/assist with her ADLs and iADLs. Mr Lorella Nimrod, her husband provides assist with transportation to appointments  Falls none   Conditions: 02/05/18 Incision and drainage of abscesses of the left labia, left side of the perineum and right buttock, acute lunar stroke, chronic hypertensive diastolic HF, DM, HTN, hyperlipidemia, vitamin b12 deficiency, angina pectoris, COPD, in grown toenail of right foot    DME walker, glucose monitor, scale     Medications: denies concerns with taking medications as prescribed, affording medications, side effects of medications and questions about medications She states she does not get the flu shot since 12/30/154 and refused it when offered by Dr Tomasa Blase recently  Prevnar taken 12/09/14    Appointments: Dr Tomasa Blase primary MD sees her q 3 months She reports the MD RN informed her she can be seen again at her December 2019 visit  and recommends she have the surgeon or wound care follow her surgical incision site   She reports she previously saw an endocrinologist in [place] about 3-4 years ago, did not feel she benefited from the visits and now has been seen by a DM staff that visits her primary MD office and Dr Tomasa Blase for DM management  CM encouraged Mrs Bamba to speak with Dr Tomasa Blase about the changes in her cbgs and to see if he wants to manage the uncontrolled DM and HTN s/s or if she can get a referral to a local endocrinologist She inquired if there was an  endocrinology office in [facility] Information provided on [facility] [address] [place] [phone] She was encouraged to verify with the office if her insurance was accepted at the office  Cm discussed the importance of managed DM related to the healing of her wound and overall health  She voiced understanding and appreciation   Podiatry to be seen 03/12/18 Dr Kaylyn Layer, [facility]   Surgeon seen on 02/13/18 Dr Ceasar Lund- [facility]ults   Advance Directives: Denies need for assist with advance directives   Consent: [facility] RN CM reviewed [facility] services with patient. Patient gave verbal consent for services [facility] Community RN CM.   Advised patient that there will be further automated EMMI- post discharge calls to assess how the patient is doing following the recent hospitalization Advised the patient that another call may be received from a nurse if any of their responses were abnormal. Patient voiced understanding and was appreciative of f/u call.   Plan: [facility] RN CM will refer Mrs Cayton to [facility] RN CM (uncontrolled CM/HTN, disease management, education, wound care management after recent admission with [facility] sn[facility] stay)  Pt encouraged to return a call to [facility] RN CM prn [facility] RN CM sent a successful outreach letter as discussed with [facility] brochure enclosed for review  Routed note to MDs listed in Epic   [place] L. Noelle Penner, RN, BSN, CCM [facility] Telephonic Care Management Care Coordinator Office number [phone] Mobile number [phone]  Main [facility] number [phone] Fax number [phone]

## 2018-02-28 NOTE — Patient Outreach (Signed)
New referral: Received referral today. Placed call to patient with no answer. Left a message requesting a call back.  PLAN: will attempt again. Will send unsuccessful outreach letter.  Rowe Pavy, RN, BSN, CEN [facility] [employer] [phone]

## 2018-03-01 DIAGNOSIS — E1140 Type 2 diabetes mellitus with diabetic neuropathy, unspecified: Secondary | ICD-10-CM | POA: Diagnosis not present

## 2018-03-01 DIAGNOSIS — E1142 Type 2 diabetes mellitus with diabetic polyneuropathy: Secondary | ICD-10-CM | POA: Diagnosis not present

## 2018-03-01 DIAGNOSIS — I2510 Atherosclerotic heart disease of native coronary artery without angina pectoris: Secondary | ICD-10-CM | POA: Diagnosis not present

## 2018-03-01 DIAGNOSIS — I509 Heart failure, unspecified: Secondary | ICD-10-CM | POA: Diagnosis not present

## 2018-03-01 DIAGNOSIS — I110 Hypertensive heart disease with heart failure: Secondary | ICD-10-CM | POA: Diagnosis not present

## 2018-03-01 DIAGNOSIS — L02215 Cutaneous abscess of perineum: Secondary | ICD-10-CM | POA: Diagnosis not present

## 2018-03-01 DIAGNOSIS — M810 Age-related osteoporosis without current pathological fracture: Secondary | ICD-10-CM | POA: Diagnosis not present

## 2018-03-02 DIAGNOSIS — I110 Hypertensive heart disease with heart failure: Secondary | ICD-10-CM | POA: Diagnosis not present

## 2018-03-02 DIAGNOSIS — L02215 Cutaneous abscess of perineum: Secondary | ICD-10-CM | POA: Diagnosis not present

## 2018-03-02 DIAGNOSIS — E1142 Type 2 diabetes mellitus with diabetic polyneuropathy: Secondary | ICD-10-CM | POA: Diagnosis not present

## 2018-03-02 DIAGNOSIS — I509 Heart failure, unspecified: Secondary | ICD-10-CM | POA: Diagnosis not present

## 2018-03-02 DIAGNOSIS — I2510 Atherosclerotic heart disease of native coronary artery without angina pectoris: Secondary | ICD-10-CM | POA: Diagnosis not present

## 2018-03-02 DIAGNOSIS — M810 Age-related osteoporosis without current pathological fracture: Secondary | ICD-10-CM | POA: Diagnosis not present

## 2018-03-03 DIAGNOSIS — I509 Heart failure, unspecified: Secondary | ICD-10-CM | POA: Diagnosis not present

## 2018-03-03 DIAGNOSIS — I110 Hypertensive heart disease with heart failure: Secondary | ICD-10-CM | POA: Diagnosis not present

## 2018-03-03 DIAGNOSIS — M810 Age-related osteoporosis without current pathological fracture: Secondary | ICD-10-CM | POA: Diagnosis not present

## 2018-03-03 DIAGNOSIS — I2510 Atherosclerotic heart disease of native coronary artery without angina pectoris: Secondary | ICD-10-CM | POA: Diagnosis not present

## 2018-03-03 DIAGNOSIS — L02215 Cutaneous abscess of perineum: Secondary | ICD-10-CM | POA: Diagnosis not present

## 2018-03-03 DIAGNOSIS — E1142 Type 2 diabetes mellitus with diabetic polyneuropathy: Secondary | ICD-10-CM | POA: Diagnosis not present

## 2018-03-04 ENCOUNTER — Other Ambulatory Visit: Payer: Self-pay

## 2018-03-04 DIAGNOSIS — L02215 Cutaneous abscess of perineum: Secondary | ICD-10-CM | POA: Diagnosis not present

## 2018-03-04 DIAGNOSIS — I2510 Atherosclerotic heart disease of native coronary artery without angina pectoris: Secondary | ICD-10-CM | POA: Diagnosis not present

## 2018-03-04 DIAGNOSIS — I110 Hypertensive heart disease with heart failure: Secondary | ICD-10-CM | POA: Diagnosis not present

## 2018-03-04 DIAGNOSIS — E1142 Type 2 diabetes mellitus with diabetic polyneuropathy: Secondary | ICD-10-CM | POA: Diagnosis not present

## 2018-03-04 DIAGNOSIS — I509 Heart failure, unspecified: Secondary | ICD-10-CM | POA: Diagnosis not present

## 2018-03-04 DIAGNOSIS — M810 Age-related osteoporosis without current pathological fracture: Secondary | ICD-10-CM | POA: Diagnosis not present

## 2018-03-05 DIAGNOSIS — M810 Age-related osteoporosis without current pathological fracture: Secondary | ICD-10-CM | POA: Diagnosis not present

## 2018-03-05 DIAGNOSIS — E1142 Type 2 diabetes mellitus with diabetic polyneuropathy: Secondary | ICD-10-CM | POA: Diagnosis not present

## 2018-03-05 DIAGNOSIS — L02215 Cutaneous abscess of perineum: Secondary | ICD-10-CM | POA: Diagnosis not present

## 2018-03-05 DIAGNOSIS — I110 Hypertensive heart disease with heart failure: Secondary | ICD-10-CM | POA: Diagnosis not present

## 2018-03-05 DIAGNOSIS — I2510 Atherosclerotic heart disease of native coronary artery without angina pectoris: Secondary | ICD-10-CM | POA: Diagnosis not present

## 2018-03-05 DIAGNOSIS — I509 Heart failure, unspecified: Secondary | ICD-10-CM | POA: Diagnosis not present

## 2018-03-05 NOTE — Patient Outreach (Signed)
Telephone assessment:  Placed call to patient who reports she is feeling good today. Reports home health nurse comes daily to pack wounds ( labia). Reports physical therapist is coming today.  Patient reports difficulty managing her DM. Last a1c of 12.7 (01/2018).  Reports insulin doses with patient who is self adjusting insulin based on her CBG readings. Offered home visit and patient has accepted.  PLAN: Home visit for assessment on 03/06/2018. Confirmed address and provided my contact information.  Rowe Pavy, RN, BSN, CEN [facility] [employer] [phone]

## 2018-03-06 ENCOUNTER — Other Ambulatory Visit: Payer: Self-pay

## 2018-03-06 ENCOUNTER — Ambulatory Visit: Payer: Self-pay

## 2018-03-06 DIAGNOSIS — L02215 Cutaneous abscess of perineum: Secondary | ICD-10-CM | POA: Diagnosis not present

## 2018-03-06 DIAGNOSIS — M810 Age-related osteoporosis without current pathological fracture: Secondary | ICD-10-CM | POA: Diagnosis not present

## 2018-03-06 DIAGNOSIS — I2510 Atherosclerotic heart disease of native coronary artery without angina pectoris: Secondary | ICD-10-CM | POA: Diagnosis not present

## 2018-03-06 DIAGNOSIS — I509 Heart failure, unspecified: Secondary | ICD-10-CM | POA: Diagnosis not present

## 2018-03-06 DIAGNOSIS — I110 Hypertensive heart disease with heart failure: Secondary | ICD-10-CM | POA: Diagnosis not present

## 2018-03-06 DIAGNOSIS — E1142 Type 2 diabetes mellitus with diabetic polyneuropathy: Secondary | ICD-10-CM | POA: Diagnosis not present

## 2018-03-06 NOTE — Patient Outreach (Signed)
[facility]) Care Management  03/06/2018  [name] 09-17-1945 [id number]   Arrived for initial home visit at 2:30pm.  Sister of patient present.  Took time to review [facility] program vs home health programs. Reviewed with patient that she has home health daily for wound care and packing. Reports her DM was high while she has had an infection.  Reports her normal A1c is 6-7.  Reports she has been diabetic for 20 years and knows how to manage well. Reports she is well connected with her primary MD.  Declines need for specialist at this time.  Reviewed program again and patient reports she is doing well and will call [facility] after home health is finished if she needs additional assistance. I provided a new patient packet, 24 hour nurse magnet, my contact card and a patient education DM packet.  Patient happy with plan and will call me if needed.  PLAN: will close case at this time as patient denies needs. Reviewed with patient [facility] would be happy to assist as needed in the future.  Will notify MD.  No case closure letter sent to patient as contact information already provided.   Rowe Pavy, RN, BSN, CEN [facility] [employer] [phone]

## 2018-03-07 DIAGNOSIS — I2510 Atherosclerotic heart disease of native coronary artery without angina pectoris: Secondary | ICD-10-CM | POA: Diagnosis not present

## 2018-03-07 DIAGNOSIS — E1142 Type 2 diabetes mellitus with diabetic polyneuropathy: Secondary | ICD-10-CM | POA: Diagnosis not present

## 2018-03-07 DIAGNOSIS — I509 Heart failure, unspecified: Secondary | ICD-10-CM | POA: Diagnosis not present

## 2018-03-07 DIAGNOSIS — I110 Hypertensive heart disease with heart failure: Secondary | ICD-10-CM | POA: Diagnosis not present

## 2018-03-07 DIAGNOSIS — M810 Age-related osteoporosis without current pathological fracture: Secondary | ICD-10-CM | POA: Diagnosis not present

## 2018-03-07 DIAGNOSIS — L02215 Cutaneous abscess of perineum: Secondary | ICD-10-CM | POA: Diagnosis not present

## 2018-03-08 DIAGNOSIS — I509 Heart failure, unspecified: Secondary | ICD-10-CM | POA: Diagnosis not present

## 2018-03-08 DIAGNOSIS — M810 Age-related osteoporosis without current pathological fracture: Secondary | ICD-10-CM | POA: Diagnosis not present

## 2018-03-08 DIAGNOSIS — E1142 Type 2 diabetes mellitus with diabetic polyneuropathy: Secondary | ICD-10-CM | POA: Diagnosis not present

## 2018-03-08 DIAGNOSIS — I110 Hypertensive heart disease with heart failure: Secondary | ICD-10-CM | POA: Diagnosis not present

## 2018-03-08 DIAGNOSIS — L02215 Cutaneous abscess of perineum: Secondary | ICD-10-CM | POA: Diagnosis not present

## 2018-03-08 DIAGNOSIS — I2510 Atherosclerotic heart disease of native coronary artery without angina pectoris: Secondary | ICD-10-CM | POA: Diagnosis not present

## 2018-03-09 DIAGNOSIS — I2510 Atherosclerotic heart disease of native coronary artery without angina pectoris: Secondary | ICD-10-CM | POA: Diagnosis not present

## 2018-03-09 DIAGNOSIS — E1142 Type 2 diabetes mellitus with diabetic polyneuropathy: Secondary | ICD-10-CM | POA: Diagnosis not present

## 2018-03-09 DIAGNOSIS — I509 Heart failure, unspecified: Secondary | ICD-10-CM | POA: Diagnosis not present

## 2018-03-09 DIAGNOSIS — M810 Age-related osteoporosis without current pathological fracture: Secondary | ICD-10-CM | POA: Diagnosis not present

## 2018-03-09 DIAGNOSIS — I110 Hypertensive heart disease with heart failure: Secondary | ICD-10-CM | POA: Diagnosis not present

## 2018-03-09 DIAGNOSIS — L02215 Cutaneous abscess of perineum: Secondary | ICD-10-CM | POA: Diagnosis not present

## 2018-03-10 DIAGNOSIS — I110 Hypertensive heart disease with heart failure: Secondary | ICD-10-CM | POA: Diagnosis not present

## 2018-03-10 DIAGNOSIS — L02215 Cutaneous abscess of perineum: Secondary | ICD-10-CM | POA: Diagnosis not present

## 2018-03-10 DIAGNOSIS — E1142 Type 2 diabetes mellitus with diabetic polyneuropathy: Secondary | ICD-10-CM | POA: Diagnosis not present

## 2018-03-10 DIAGNOSIS — I509 Heart failure, unspecified: Secondary | ICD-10-CM | POA: Diagnosis not present

## 2018-03-10 DIAGNOSIS — I2510 Atherosclerotic heart disease of native coronary artery without angina pectoris: Secondary | ICD-10-CM | POA: Diagnosis not present

## 2018-03-10 DIAGNOSIS — M810 Age-related osteoporosis without current pathological fracture: Secondary | ICD-10-CM | POA: Diagnosis not present

## 2018-03-11 DIAGNOSIS — M810 Age-related osteoporosis without current pathological fracture: Secondary | ICD-10-CM | POA: Diagnosis not present

## 2018-03-11 DIAGNOSIS — I509 Heart failure, unspecified: Secondary | ICD-10-CM | POA: Diagnosis not present

## 2018-03-11 DIAGNOSIS — L02215 Cutaneous abscess of perineum: Secondary | ICD-10-CM | POA: Diagnosis not present

## 2018-03-11 DIAGNOSIS — I2510 Atherosclerotic heart disease of native coronary artery without angina pectoris: Secondary | ICD-10-CM | POA: Diagnosis not present

## 2018-03-11 DIAGNOSIS — E1142 Type 2 diabetes mellitus with diabetic polyneuropathy: Secondary | ICD-10-CM | POA: Diagnosis not present

## 2018-03-11 DIAGNOSIS — I110 Hypertensive heart disease with heart failure: Secondary | ICD-10-CM | POA: Diagnosis not present

## 2018-03-12 DIAGNOSIS — E1142 Type 2 diabetes mellitus with diabetic polyneuropathy: Secondary | ICD-10-CM | POA: Diagnosis not present

## 2018-03-12 DIAGNOSIS — I2510 Atherosclerotic heart disease of native coronary artery without angina pectoris: Secondary | ICD-10-CM | POA: Diagnosis not present

## 2018-03-12 DIAGNOSIS — I110 Hypertensive heart disease with heart failure: Secondary | ICD-10-CM | POA: Diagnosis not present

## 2018-03-12 DIAGNOSIS — L02215 Cutaneous abscess of perineum: Secondary | ICD-10-CM | POA: Diagnosis not present

## 2018-03-12 DIAGNOSIS — I509 Heart failure, unspecified: Secondary | ICD-10-CM | POA: Diagnosis not present

## 2018-03-12 DIAGNOSIS — M810 Age-related osteoporosis without current pathological fracture: Secondary | ICD-10-CM | POA: Diagnosis not present

## 2018-03-14 DIAGNOSIS — E1142 Type 2 diabetes mellitus with diabetic polyneuropathy: Secondary | ICD-10-CM | POA: Diagnosis not present

## 2018-03-14 DIAGNOSIS — I2510 Atherosclerotic heart disease of native coronary artery without angina pectoris: Secondary | ICD-10-CM | POA: Diagnosis not present

## 2018-03-14 DIAGNOSIS — I509 Heart failure, unspecified: Secondary | ICD-10-CM | POA: Diagnosis not present

## 2018-03-14 DIAGNOSIS — I110 Hypertensive heart disease with heart failure: Secondary | ICD-10-CM | POA: Diagnosis not present

## 2018-03-14 DIAGNOSIS — M810 Age-related osteoporosis without current pathological fracture: Secondary | ICD-10-CM | POA: Diagnosis not present

## 2018-03-14 DIAGNOSIS — L02215 Cutaneous abscess of perineum: Secondary | ICD-10-CM | POA: Diagnosis not present

## 2018-03-15 DIAGNOSIS — L02215 Cutaneous abscess of perineum: Secondary | ICD-10-CM | POA: Diagnosis not present

## 2018-03-15 DIAGNOSIS — I110 Hypertensive heart disease with heart failure: Secondary | ICD-10-CM | POA: Diagnosis not present

## 2018-03-15 DIAGNOSIS — I509 Heart failure, unspecified: Secondary | ICD-10-CM | POA: Diagnosis not present

## 2018-03-15 DIAGNOSIS — M810 Age-related osteoporosis without current pathological fracture: Secondary | ICD-10-CM | POA: Diagnosis not present

## 2018-03-15 DIAGNOSIS — I2510 Atherosclerotic heart disease of native coronary artery without angina pectoris: Secondary | ICD-10-CM | POA: Diagnosis not present

## 2018-03-15 DIAGNOSIS — E1142 Type 2 diabetes mellitus with diabetic polyneuropathy: Secondary | ICD-10-CM | POA: Diagnosis not present

## 2018-03-19 DIAGNOSIS — M810 Age-related osteoporosis without current pathological fracture: Secondary | ICD-10-CM | POA: Diagnosis not present

## 2018-03-19 DIAGNOSIS — I110 Hypertensive heart disease with heart failure: Secondary | ICD-10-CM | POA: Diagnosis not present

## 2018-03-19 DIAGNOSIS — I2510 Atherosclerotic heart disease of native coronary artery without angina pectoris: Secondary | ICD-10-CM | POA: Diagnosis not present

## 2018-03-19 DIAGNOSIS — E1142 Type 2 diabetes mellitus with diabetic polyneuropathy: Secondary | ICD-10-CM | POA: Diagnosis not present

## 2018-03-19 DIAGNOSIS — I509 Heart failure, unspecified: Secondary | ICD-10-CM | POA: Diagnosis not present

## 2018-03-19 DIAGNOSIS — L02215 Cutaneous abscess of perineum: Secondary | ICD-10-CM | POA: Diagnosis not present

## 2018-03-21 DIAGNOSIS — M810 Age-related osteoporosis without current pathological fracture: Secondary | ICD-10-CM | POA: Diagnosis not present

## 2018-03-21 DIAGNOSIS — I509 Heart failure, unspecified: Secondary | ICD-10-CM | POA: Diagnosis not present

## 2018-03-21 DIAGNOSIS — I110 Hypertensive heart disease with heart failure: Secondary | ICD-10-CM | POA: Diagnosis not present

## 2018-03-21 DIAGNOSIS — I2510 Atherosclerotic heart disease of native coronary artery without angina pectoris: Secondary | ICD-10-CM | POA: Diagnosis not present

## 2018-03-21 DIAGNOSIS — L02215 Cutaneous abscess of perineum: Secondary | ICD-10-CM | POA: Diagnosis not present

## 2018-03-21 DIAGNOSIS — E1142 Type 2 diabetes mellitus with diabetic polyneuropathy: Secondary | ICD-10-CM | POA: Diagnosis not present

## 2018-03-22 DIAGNOSIS — E1142 Type 2 diabetes mellitus with diabetic polyneuropathy: Secondary | ICD-10-CM | POA: Diagnosis not present

## 2018-03-22 DIAGNOSIS — I509 Heart failure, unspecified: Secondary | ICD-10-CM | POA: Diagnosis not present

## 2018-03-22 DIAGNOSIS — I2510 Atherosclerotic heart disease of native coronary artery without angina pectoris: Secondary | ICD-10-CM | POA: Diagnosis not present

## 2018-03-22 DIAGNOSIS — M810 Age-related osteoporosis without current pathological fracture: Secondary | ICD-10-CM | POA: Diagnosis not present

## 2018-03-22 DIAGNOSIS — L02215 Cutaneous abscess of perineum: Secondary | ICD-10-CM | POA: Diagnosis not present

## 2018-03-22 DIAGNOSIS — I110 Hypertensive heart disease with heart failure: Secondary | ICD-10-CM | POA: Diagnosis not present

## 2018-03-26 DIAGNOSIS — I2510 Atherosclerotic heart disease of native coronary artery without angina pectoris: Secondary | ICD-10-CM | POA: Diagnosis not present

## 2018-03-26 DIAGNOSIS — M810 Age-related osteoporosis without current pathological fracture: Secondary | ICD-10-CM | POA: Diagnosis not present

## 2018-03-26 DIAGNOSIS — I110 Hypertensive heart disease with heart failure: Secondary | ICD-10-CM | POA: Diagnosis not present

## 2018-03-26 DIAGNOSIS — I509 Heart failure, unspecified: Secondary | ICD-10-CM | POA: Diagnosis not present

## 2018-03-26 DIAGNOSIS — L02215 Cutaneous abscess of perineum: Secondary | ICD-10-CM | POA: Diagnosis not present

## 2018-03-26 DIAGNOSIS — E1142 Type 2 diabetes mellitus with diabetic polyneuropathy: Secondary | ICD-10-CM | POA: Diagnosis not present

## 2018-03-29 DIAGNOSIS — L02215 Cutaneous abscess of perineum: Secondary | ICD-10-CM | POA: Diagnosis not present

## 2018-03-29 DIAGNOSIS — I509 Heart failure, unspecified: Secondary | ICD-10-CM | POA: Diagnosis not present

## 2018-03-29 DIAGNOSIS — M810 Age-related osteoporosis without current pathological fracture: Secondary | ICD-10-CM | POA: Diagnosis not present

## 2018-03-29 DIAGNOSIS — E1142 Type 2 diabetes mellitus with diabetic polyneuropathy: Secondary | ICD-10-CM | POA: Diagnosis not present

## 2018-03-29 DIAGNOSIS — I2510 Atherosclerotic heart disease of native coronary artery without angina pectoris: Secondary | ICD-10-CM | POA: Diagnosis not present

## 2018-03-29 DIAGNOSIS — I110 Hypertensive heart disease with heart failure: Secondary | ICD-10-CM | POA: Diagnosis not present

## 2018-04-02 DIAGNOSIS — M810 Age-related osteoporosis without current pathological fracture: Secondary | ICD-10-CM | POA: Diagnosis not present

## 2018-04-02 DIAGNOSIS — B351 Tinea unguium: Secondary | ICD-10-CM | POA: Diagnosis not present

## 2018-04-02 DIAGNOSIS — I110 Hypertensive heart disease with heart failure: Secondary | ICD-10-CM | POA: Diagnosis not present

## 2018-04-02 DIAGNOSIS — M79674 Pain in right toe(s): Secondary | ICD-10-CM | POA: Diagnosis not present

## 2018-04-02 DIAGNOSIS — L02215 Cutaneous abscess of perineum: Secondary | ICD-10-CM | POA: Diagnosis not present

## 2018-04-02 DIAGNOSIS — E1142 Type 2 diabetes mellitus with diabetic polyneuropathy: Secondary | ICD-10-CM | POA: Diagnosis not present

## 2018-04-02 DIAGNOSIS — I2510 Atherosclerotic heart disease of native coronary artery without angina pectoris: Secondary | ICD-10-CM | POA: Diagnosis not present

## 2018-04-02 DIAGNOSIS — I509 Heart failure, unspecified: Secondary | ICD-10-CM | POA: Diagnosis not present

## 2018-04-03 DIAGNOSIS — M810 Age-related osteoporosis without current pathological fracture: Secondary | ICD-10-CM | POA: Diagnosis not present

## 2018-04-03 DIAGNOSIS — I110 Hypertensive heart disease with heart failure: Secondary | ICD-10-CM | POA: Diagnosis not present

## 2018-04-03 DIAGNOSIS — I2510 Atherosclerotic heart disease of native coronary artery without angina pectoris: Secondary | ICD-10-CM | POA: Diagnosis not present

## 2018-04-03 DIAGNOSIS — E1142 Type 2 diabetes mellitus with diabetic polyneuropathy: Secondary | ICD-10-CM | POA: Diagnosis not present

## 2018-04-03 DIAGNOSIS — I509 Heart failure, unspecified: Secondary | ICD-10-CM | POA: Diagnosis not present

## 2018-04-03 DIAGNOSIS — L02215 Cutaneous abscess of perineum: Secondary | ICD-10-CM | POA: Diagnosis not present

## 2018-04-09 DIAGNOSIS — E1142 Type 2 diabetes mellitus with diabetic polyneuropathy: Secondary | ICD-10-CM | POA: Diagnosis not present

## 2018-04-09 DIAGNOSIS — M810 Age-related osteoporosis without current pathological fracture: Secondary | ICD-10-CM | POA: Diagnosis not present

## 2018-04-09 DIAGNOSIS — L02215 Cutaneous abscess of perineum: Secondary | ICD-10-CM | POA: Diagnosis not present

## 2018-04-09 DIAGNOSIS — I2510 Atherosclerotic heart disease of native coronary artery without angina pectoris: Secondary | ICD-10-CM | POA: Diagnosis not present

## 2018-04-09 DIAGNOSIS — I110 Hypertensive heart disease with heart failure: Secondary | ICD-10-CM | POA: Diagnosis not present

## 2018-04-09 DIAGNOSIS — R300 Dysuria: Secondary | ICD-10-CM | POA: Diagnosis not present

## 2018-04-09 DIAGNOSIS — I509 Heart failure, unspecified: Secondary | ICD-10-CM | POA: Diagnosis not present

## 2018-04-11 DIAGNOSIS — L089 Local infection of the skin and subcutaneous tissue, unspecified: Secondary | ICD-10-CM

## 2018-04-11 HISTORY — DX: Local infection of the skin and subcutaneous tissue, unspecified: L08.9

## 2018-04-16 DIAGNOSIS — I2510 Atherosclerotic heart disease of native coronary artery without angina pectoris: Secondary | ICD-10-CM | POA: Diagnosis not present

## 2018-04-16 DIAGNOSIS — L02215 Cutaneous abscess of perineum: Secondary | ICD-10-CM | POA: Diagnosis not present

## 2018-04-16 DIAGNOSIS — M810 Age-related osteoporosis without current pathological fracture: Secondary | ICD-10-CM | POA: Diagnosis not present

## 2018-04-16 DIAGNOSIS — I110 Hypertensive heart disease with heart failure: Secondary | ICD-10-CM | POA: Diagnosis not present

## 2018-04-16 DIAGNOSIS — I509 Heart failure, unspecified: Secondary | ICD-10-CM | POA: Diagnosis not present

## 2018-04-16 DIAGNOSIS — E1142 Type 2 diabetes mellitus with diabetic polyneuropathy: Secondary | ICD-10-CM | POA: Diagnosis not present

## 2018-04-17 DIAGNOSIS — L02215 Cutaneous abscess of perineum: Secondary | ICD-10-CM | POA: Diagnosis not present

## 2018-04-17 DIAGNOSIS — I110 Hypertensive heart disease with heart failure: Secondary | ICD-10-CM | POA: Diagnosis not present

## 2018-04-17 DIAGNOSIS — I2510 Atherosclerotic heart disease of native coronary artery without angina pectoris: Secondary | ICD-10-CM | POA: Diagnosis not present

## 2018-04-17 DIAGNOSIS — E1142 Type 2 diabetes mellitus with diabetic polyneuropathy: Secondary | ICD-10-CM | POA: Diagnosis not present

## 2018-04-17 DIAGNOSIS — I509 Heart failure, unspecified: Secondary | ICD-10-CM | POA: Diagnosis not present

## 2018-04-17 DIAGNOSIS — M810 Age-related osteoporosis without current pathological fracture: Secondary | ICD-10-CM | POA: Diagnosis not present

## 2018-04-19 DIAGNOSIS — I110 Hypertensive heart disease with heart failure: Secondary | ICD-10-CM | POA: Diagnosis not present

## 2018-04-19 DIAGNOSIS — E1142 Type 2 diabetes mellitus with diabetic polyneuropathy: Secondary | ICD-10-CM | POA: Diagnosis not present

## 2018-04-19 DIAGNOSIS — I509 Heart failure, unspecified: Secondary | ICD-10-CM | POA: Diagnosis not present

## 2018-04-19 DIAGNOSIS — I2510 Atherosclerotic heart disease of native coronary artery without angina pectoris: Secondary | ICD-10-CM | POA: Diagnosis not present

## 2018-04-19 DIAGNOSIS — L02215 Cutaneous abscess of perineum: Secondary | ICD-10-CM | POA: Diagnosis not present

## 2018-04-19 DIAGNOSIS — M810 Age-related osteoporosis without current pathological fracture: Secondary | ICD-10-CM | POA: Diagnosis not present

## 2018-04-24 DIAGNOSIS — M810 Age-related osteoporosis without current pathological fracture: Secondary | ICD-10-CM | POA: Diagnosis not present

## 2018-04-24 DIAGNOSIS — I2510 Atherosclerotic heart disease of native coronary artery without angina pectoris: Secondary | ICD-10-CM | POA: Diagnosis not present

## 2018-04-24 DIAGNOSIS — I509 Heart failure, unspecified: Secondary | ICD-10-CM | POA: Diagnosis not present

## 2018-04-24 DIAGNOSIS — E1142 Type 2 diabetes mellitus with diabetic polyneuropathy: Secondary | ICD-10-CM | POA: Diagnosis not present

## 2018-04-24 DIAGNOSIS — I110 Hypertensive heart disease with heart failure: Secondary | ICD-10-CM | POA: Diagnosis not present

## 2018-04-24 DIAGNOSIS — L02215 Cutaneous abscess of perineum: Secondary | ICD-10-CM | POA: Diagnosis not present

## 2018-04-26 DIAGNOSIS — E1142 Type 2 diabetes mellitus with diabetic polyneuropathy: Secondary | ICD-10-CM | POA: Diagnosis not present

## 2018-04-26 DIAGNOSIS — I2510 Atherosclerotic heart disease of native coronary artery without angina pectoris: Secondary | ICD-10-CM | POA: Diagnosis not present

## 2018-04-26 DIAGNOSIS — I509 Heart failure, unspecified: Secondary | ICD-10-CM | POA: Diagnosis not present

## 2018-04-26 DIAGNOSIS — M810 Age-related osteoporosis without current pathological fracture: Secondary | ICD-10-CM | POA: Diagnosis not present

## 2018-04-26 DIAGNOSIS — L02215 Cutaneous abscess of perineum: Secondary | ICD-10-CM | POA: Diagnosis not present

## 2018-04-26 DIAGNOSIS — I110 Hypertensive heart disease with heart failure: Secondary | ICD-10-CM | POA: Diagnosis not present

## 2018-04-28 DIAGNOSIS — I2510 Atherosclerotic heart disease of native coronary artery without angina pectoris: Secondary | ICD-10-CM | POA: Diagnosis not present

## 2018-04-28 DIAGNOSIS — Z87440 Personal history of urinary (tract) infections: Secondary | ICD-10-CM | POA: Diagnosis not present

## 2018-04-28 DIAGNOSIS — I110 Hypertensive heart disease with heart failure: Secondary | ICD-10-CM | POA: Diagnosis not present

## 2018-04-28 DIAGNOSIS — Z8673 Personal history of transient ischemic attack (TIA), and cerebral infarction without residual deficits: Secondary | ICD-10-CM | POA: Diagnosis not present

## 2018-04-28 DIAGNOSIS — Z7902 Long term (current) use of antithrombotics/antiplatelets: Secondary | ICD-10-CM | POA: Diagnosis not present

## 2018-04-28 DIAGNOSIS — M810 Age-related osteoporosis without current pathological fracture: Secondary | ICD-10-CM | POA: Diagnosis not present

## 2018-04-28 DIAGNOSIS — Z9181 History of falling: Secondary | ICD-10-CM | POA: Diagnosis not present

## 2018-04-28 DIAGNOSIS — I509 Heart failure, unspecified: Secondary | ICD-10-CM | POA: Diagnosis not present

## 2018-04-28 DIAGNOSIS — Z794 Long term (current) use of insulin: Secondary | ICD-10-CM | POA: Diagnosis not present

## 2018-04-28 DIAGNOSIS — E1142 Type 2 diabetes mellitus with diabetic polyneuropathy: Secondary | ICD-10-CM | POA: Diagnosis not present

## 2018-04-29 DIAGNOSIS — Z7902 Long term (current) use of antithrombotics/antiplatelets: Secondary | ICD-10-CM | POA: Diagnosis not present

## 2018-04-29 DIAGNOSIS — I110 Hypertensive heart disease with heart failure: Secondary | ICD-10-CM | POA: Diagnosis not present

## 2018-04-29 DIAGNOSIS — I509 Heart failure, unspecified: Secondary | ICD-10-CM | POA: Diagnosis not present

## 2018-04-29 DIAGNOSIS — E1142 Type 2 diabetes mellitus with diabetic polyneuropathy: Secondary | ICD-10-CM | POA: Diagnosis not present

## 2018-04-29 DIAGNOSIS — I2510 Atherosclerotic heart disease of native coronary artery without angina pectoris: Secondary | ICD-10-CM | POA: Diagnosis not present

## 2018-04-29 DIAGNOSIS — M810 Age-related osteoporosis without current pathological fracture: Secondary | ICD-10-CM | POA: Diagnosis not present

## 2018-04-30 DIAGNOSIS — Z7902 Long term (current) use of antithrombotics/antiplatelets: Secondary | ICD-10-CM | POA: Diagnosis not present

## 2018-04-30 DIAGNOSIS — M810 Age-related osteoporosis without current pathological fracture: Secondary | ICD-10-CM | POA: Diagnosis not present

## 2018-04-30 DIAGNOSIS — I2510 Atherosclerotic heart disease of native coronary artery without angina pectoris: Secondary | ICD-10-CM | POA: Diagnosis not present

## 2018-04-30 DIAGNOSIS — I509 Heart failure, unspecified: Secondary | ICD-10-CM | POA: Diagnosis not present

## 2018-04-30 DIAGNOSIS — E1142 Type 2 diabetes mellitus with diabetic polyneuropathy: Secondary | ICD-10-CM | POA: Diagnosis not present

## 2018-04-30 DIAGNOSIS — I110 Hypertensive heart disease with heart failure: Secondary | ICD-10-CM | POA: Diagnosis not present

## 2018-05-06 DIAGNOSIS — E1142 Type 2 diabetes mellitus with diabetic polyneuropathy: Secondary | ICD-10-CM | POA: Diagnosis not present

## 2018-05-06 DIAGNOSIS — E785 Hyperlipidemia, unspecified: Secondary | ICD-10-CM | POA: Diagnosis not present

## 2018-05-06 DIAGNOSIS — I509 Heart failure, unspecified: Secondary | ICD-10-CM | POA: Diagnosis not present

## 2018-05-06 DIAGNOSIS — M810 Age-related osteoporosis without current pathological fracture: Secondary | ICD-10-CM | POA: Diagnosis not present

## 2018-05-06 DIAGNOSIS — Z7902 Long term (current) use of antithrombotics/antiplatelets: Secondary | ICD-10-CM | POA: Diagnosis not present

## 2018-05-06 DIAGNOSIS — I6381 Other cerebral infarction due to occlusion or stenosis of small artery: Secondary | ICD-10-CM | POA: Diagnosis not present

## 2018-05-06 DIAGNOSIS — I110 Hypertensive heart disease with heart failure: Secondary | ICD-10-CM | POA: Diagnosis not present

## 2018-05-06 DIAGNOSIS — D519 Vitamin B12 deficiency anemia, unspecified: Secondary | ICD-10-CM | POA: Diagnosis not present

## 2018-05-06 DIAGNOSIS — E1140 Type 2 diabetes mellitus with diabetic neuropathy, unspecified: Secondary | ICD-10-CM | POA: Diagnosis not present

## 2018-05-06 DIAGNOSIS — I2510 Atherosclerotic heart disease of native coronary artery without angina pectoris: Secondary | ICD-10-CM | POA: Diagnosis not present

## 2018-05-07 DIAGNOSIS — Z7902 Long term (current) use of antithrombotics/antiplatelets: Secondary | ICD-10-CM | POA: Diagnosis not present

## 2018-05-07 DIAGNOSIS — M810 Age-related osteoporosis without current pathological fracture: Secondary | ICD-10-CM | POA: Diagnosis not present

## 2018-05-07 DIAGNOSIS — I509 Heart failure, unspecified: Secondary | ICD-10-CM | POA: Diagnosis not present

## 2018-05-07 DIAGNOSIS — I110 Hypertensive heart disease with heart failure: Secondary | ICD-10-CM | POA: Diagnosis not present

## 2018-05-07 DIAGNOSIS — E1142 Type 2 diabetes mellitus with diabetic polyneuropathy: Secondary | ICD-10-CM | POA: Diagnosis not present

## 2018-05-07 DIAGNOSIS — I2510 Atherosclerotic heart disease of native coronary artery without angina pectoris: Secondary | ICD-10-CM | POA: Diagnosis not present

## 2018-05-09 DIAGNOSIS — R7989 Other specified abnormal findings of blood chemistry: Secondary | ICD-10-CM | POA: Diagnosis not present

## 2018-05-09 DIAGNOSIS — E538 Deficiency of other specified B group vitamins: Secondary | ICD-10-CM | POA: Diagnosis not present

## 2018-05-14 DIAGNOSIS — I509 Heart failure, unspecified: Secondary | ICD-10-CM | POA: Diagnosis not present

## 2018-05-14 DIAGNOSIS — Z7902 Long term (current) use of antithrombotics/antiplatelets: Secondary | ICD-10-CM | POA: Diagnosis not present

## 2018-05-14 DIAGNOSIS — I2510 Atherosclerotic heart disease of native coronary artery without angina pectoris: Secondary | ICD-10-CM | POA: Diagnosis not present

## 2018-05-14 DIAGNOSIS — E1142 Type 2 diabetes mellitus with diabetic polyneuropathy: Secondary | ICD-10-CM | POA: Diagnosis not present

## 2018-05-14 DIAGNOSIS — M810 Age-related osteoporosis without current pathological fracture: Secondary | ICD-10-CM | POA: Diagnosis not present

## 2018-05-14 DIAGNOSIS — I110 Hypertensive heart disease with heart failure: Secondary | ICD-10-CM | POA: Diagnosis not present

## 2018-05-16 DIAGNOSIS — D519 Vitamin B12 deficiency anemia, unspecified: Secondary | ICD-10-CM | POA: Diagnosis not present

## 2018-05-17 DIAGNOSIS — Z7902 Long term (current) use of antithrombotics/antiplatelets: Secondary | ICD-10-CM | POA: Diagnosis not present

## 2018-05-17 DIAGNOSIS — I2510 Atherosclerotic heart disease of native coronary artery without angina pectoris: Secondary | ICD-10-CM | POA: Diagnosis not present

## 2018-05-17 DIAGNOSIS — M810 Age-related osteoporosis without current pathological fracture: Secondary | ICD-10-CM | POA: Diagnosis not present

## 2018-05-17 DIAGNOSIS — E1142 Type 2 diabetes mellitus with diabetic polyneuropathy: Secondary | ICD-10-CM | POA: Diagnosis not present

## 2018-05-17 DIAGNOSIS — I509 Heart failure, unspecified: Secondary | ICD-10-CM | POA: Diagnosis not present

## 2018-05-17 DIAGNOSIS — I110 Hypertensive heart disease with heart failure: Secondary | ICD-10-CM | POA: Diagnosis not present

## 2018-05-21 DIAGNOSIS — I110 Hypertensive heart disease with heart failure: Secondary | ICD-10-CM | POA: Diagnosis not present

## 2018-05-21 DIAGNOSIS — M810 Age-related osteoporosis without current pathological fracture: Secondary | ICD-10-CM | POA: Diagnosis not present

## 2018-05-21 DIAGNOSIS — I509 Heart failure, unspecified: Secondary | ICD-10-CM | POA: Diagnosis not present

## 2018-05-21 DIAGNOSIS — Z7902 Long term (current) use of antithrombotics/antiplatelets: Secondary | ICD-10-CM | POA: Diagnosis not present

## 2018-05-21 DIAGNOSIS — E1142 Type 2 diabetes mellitus with diabetic polyneuropathy: Secondary | ICD-10-CM | POA: Diagnosis not present

## 2018-05-21 DIAGNOSIS — I2510 Atherosclerotic heart disease of native coronary artery without angina pectoris: Secondary | ICD-10-CM | POA: Diagnosis not present

## 2018-05-23 DIAGNOSIS — D519 Vitamin B12 deficiency anemia, unspecified: Secondary | ICD-10-CM | POA: Diagnosis not present

## 2018-05-23 DIAGNOSIS — Z862 Personal history of diseases of the blood and blood-forming organs and certain disorders involving the immune mechanism: Secondary | ICD-10-CM | POA: Diagnosis not present

## 2018-05-23 DIAGNOSIS — D649 Anemia, unspecified: Secondary | ICD-10-CM | POA: Diagnosis not present

## 2018-05-23 DIAGNOSIS — E611 Iron deficiency: Secondary | ICD-10-CM | POA: Diagnosis not present

## 2018-05-23 DIAGNOSIS — Z800 Family history of malignant neoplasm of digestive organs: Secondary | ICD-10-CM | POA: Diagnosis not present

## 2018-05-23 DIAGNOSIS — E538 Deficiency of other specified B group vitamins: Secondary | ICD-10-CM | POA: Diagnosis not present

## 2018-05-29 DIAGNOSIS — M810 Age-related osteoporosis without current pathological fracture: Secondary | ICD-10-CM | POA: Diagnosis not present

## 2018-05-29 DIAGNOSIS — I110 Hypertensive heart disease with heart failure: Secondary | ICD-10-CM | POA: Diagnosis not present

## 2018-05-29 DIAGNOSIS — I509 Heart failure, unspecified: Secondary | ICD-10-CM | POA: Diagnosis not present

## 2018-05-29 DIAGNOSIS — E1142 Type 2 diabetes mellitus with diabetic polyneuropathy: Secondary | ICD-10-CM | POA: Diagnosis not present

## 2018-05-29 DIAGNOSIS — I2510 Atherosclerotic heart disease of native coronary artery without angina pectoris: Secondary | ICD-10-CM | POA: Diagnosis not present

## 2018-05-29 DIAGNOSIS — Z7902 Long term (current) use of antithrombotics/antiplatelets: Secondary | ICD-10-CM | POA: Diagnosis not present

## 2018-05-30 DIAGNOSIS — D519 Vitamin B12 deficiency anemia, unspecified: Secondary | ICD-10-CM | POA: Diagnosis not present

## 2018-06-04 DIAGNOSIS — Z7902 Long term (current) use of antithrombotics/antiplatelets: Secondary | ICD-10-CM | POA: Diagnosis not present

## 2018-06-04 DIAGNOSIS — M810 Age-related osteoporosis without current pathological fracture: Secondary | ICD-10-CM | POA: Diagnosis not present

## 2018-06-04 DIAGNOSIS — I2510 Atherosclerotic heart disease of native coronary artery without angina pectoris: Secondary | ICD-10-CM | POA: Diagnosis not present

## 2018-06-04 DIAGNOSIS — I509 Heart failure, unspecified: Secondary | ICD-10-CM | POA: Diagnosis not present

## 2018-06-04 DIAGNOSIS — E1142 Type 2 diabetes mellitus with diabetic polyneuropathy: Secondary | ICD-10-CM | POA: Diagnosis not present

## 2018-06-04 DIAGNOSIS — I110 Hypertensive heart disease with heart failure: Secondary | ICD-10-CM | POA: Diagnosis not present

## 2018-06-07 DIAGNOSIS — I509 Heart failure, unspecified: Secondary | ICD-10-CM | POA: Diagnosis not present

## 2018-06-07 DIAGNOSIS — M810 Age-related osteoporosis without current pathological fracture: Secondary | ICD-10-CM | POA: Diagnosis not present

## 2018-06-07 DIAGNOSIS — Z7902 Long term (current) use of antithrombotics/antiplatelets: Secondary | ICD-10-CM | POA: Diagnosis not present

## 2018-06-07 DIAGNOSIS — I110 Hypertensive heart disease with heart failure: Secondary | ICD-10-CM | POA: Diagnosis not present

## 2018-06-07 DIAGNOSIS — E1142 Type 2 diabetes mellitus with diabetic polyneuropathy: Secondary | ICD-10-CM | POA: Diagnosis not present

## 2018-06-07 DIAGNOSIS — I2510 Atherosclerotic heart disease of native coronary artery without angina pectoris: Secondary | ICD-10-CM | POA: Diagnosis not present

## 2018-06-11 DIAGNOSIS — E1142 Type 2 diabetes mellitus with diabetic polyneuropathy: Secondary | ICD-10-CM | POA: Diagnosis not present

## 2018-06-11 DIAGNOSIS — I2510 Atherosclerotic heart disease of native coronary artery without angina pectoris: Secondary | ICD-10-CM | POA: Diagnosis not present

## 2018-06-11 DIAGNOSIS — I509 Heart failure, unspecified: Secondary | ICD-10-CM | POA: Diagnosis not present

## 2018-06-11 DIAGNOSIS — Z7902 Long term (current) use of antithrombotics/antiplatelets: Secondary | ICD-10-CM | POA: Diagnosis not present

## 2018-06-11 DIAGNOSIS — M810 Age-related osteoporosis without current pathological fracture: Secondary | ICD-10-CM | POA: Diagnosis not present

## 2018-06-11 DIAGNOSIS — I110 Hypertensive heart disease with heart failure: Secondary | ICD-10-CM | POA: Diagnosis not present

## 2018-06-12 DIAGNOSIS — I110 Hypertensive heart disease with heart failure: Secondary | ICD-10-CM | POA: Diagnosis not present

## 2018-06-12 DIAGNOSIS — Z6835 Body mass index (BMI) 35.0-35.9, adult: Secondary | ICD-10-CM | POA: Diagnosis not present

## 2018-06-18 DIAGNOSIS — M810 Age-related osteoporosis without current pathological fracture: Secondary | ICD-10-CM | POA: Diagnosis not present

## 2018-06-18 DIAGNOSIS — I509 Heart failure, unspecified: Secondary | ICD-10-CM | POA: Diagnosis not present

## 2018-06-18 DIAGNOSIS — Z7902 Long term (current) use of antithrombotics/antiplatelets: Secondary | ICD-10-CM | POA: Diagnosis not present

## 2018-06-18 DIAGNOSIS — I110 Hypertensive heart disease with heart failure: Secondary | ICD-10-CM | POA: Diagnosis not present

## 2018-06-18 DIAGNOSIS — E1142 Type 2 diabetes mellitus with diabetic polyneuropathy: Secondary | ICD-10-CM | POA: Diagnosis not present

## 2018-06-18 DIAGNOSIS — I2510 Atherosclerotic heart disease of native coronary artery without angina pectoris: Secondary | ICD-10-CM | POA: Diagnosis not present

## 2018-06-20 DIAGNOSIS — R531 Weakness: Secondary | ICD-10-CM | POA: Diagnosis not present

## 2018-06-20 DIAGNOSIS — D649 Anemia, unspecified: Secondary | ICD-10-CM | POA: Diagnosis not present

## 2018-06-20 DIAGNOSIS — Z862 Personal history of diseases of the blood and blood-forming organs and certain disorders involving the immune mechanism: Secondary | ICD-10-CM | POA: Diagnosis not present

## 2018-06-21 DIAGNOSIS — E1140 Type 2 diabetes mellitus with diabetic neuropathy, unspecified: Secondary | ICD-10-CM | POA: Diagnosis not present

## 2018-06-25 DIAGNOSIS — I2510 Atherosclerotic heart disease of native coronary artery without angina pectoris: Secondary | ICD-10-CM | POA: Diagnosis not present

## 2018-06-25 DIAGNOSIS — I509 Heart failure, unspecified: Secondary | ICD-10-CM | POA: Diagnosis not present

## 2018-06-25 DIAGNOSIS — Z7902 Long term (current) use of antithrombotics/antiplatelets: Secondary | ICD-10-CM | POA: Diagnosis not present

## 2018-06-25 DIAGNOSIS — I110 Hypertensive heart disease with heart failure: Secondary | ICD-10-CM | POA: Diagnosis not present

## 2018-06-25 DIAGNOSIS — M810 Age-related osteoporosis without current pathological fracture: Secondary | ICD-10-CM | POA: Diagnosis not present

## 2018-06-25 DIAGNOSIS — E1142 Type 2 diabetes mellitus with diabetic polyneuropathy: Secondary | ICD-10-CM | POA: Diagnosis not present

## 2018-06-27 DIAGNOSIS — Z8673 Personal history of transient ischemic attack (TIA), and cerebral infarction without residual deficits: Secondary | ICD-10-CM | POA: Diagnosis not present

## 2018-06-27 DIAGNOSIS — I110 Hypertensive heart disease with heart failure: Secondary | ICD-10-CM | POA: Diagnosis not present

## 2018-06-27 DIAGNOSIS — Z7902 Long term (current) use of antithrombotics/antiplatelets: Secondary | ICD-10-CM | POA: Diagnosis not present

## 2018-06-27 DIAGNOSIS — E1142 Type 2 diabetes mellitus with diabetic polyneuropathy: Secondary | ICD-10-CM | POA: Diagnosis not present

## 2018-06-27 DIAGNOSIS — M810 Age-related osteoporosis without current pathological fracture: Secondary | ICD-10-CM | POA: Diagnosis not present

## 2018-06-27 DIAGNOSIS — Z9181 History of falling: Secondary | ICD-10-CM | POA: Diagnosis not present

## 2018-06-27 DIAGNOSIS — Z794 Long term (current) use of insulin: Secondary | ICD-10-CM | POA: Diagnosis not present

## 2018-06-27 DIAGNOSIS — I2510 Atherosclerotic heart disease of native coronary artery without angina pectoris: Secondary | ICD-10-CM | POA: Diagnosis not present

## 2018-06-27 DIAGNOSIS — I509 Heart failure, unspecified: Secondary | ICD-10-CM | POA: Diagnosis not present

## 2018-06-27 DIAGNOSIS — Z87440 Personal history of urinary (tract) infections: Secondary | ICD-10-CM | POA: Diagnosis not present

## 2018-07-01 DIAGNOSIS — D519 Vitamin B12 deficiency anemia, unspecified: Secondary | ICD-10-CM | POA: Diagnosis not present

## 2018-07-04 DIAGNOSIS — Z7902 Long term (current) use of antithrombotics/antiplatelets: Secondary | ICD-10-CM | POA: Diagnosis not present

## 2018-07-04 DIAGNOSIS — E1142 Type 2 diabetes mellitus with diabetic polyneuropathy: Secondary | ICD-10-CM | POA: Diagnosis not present

## 2018-07-04 DIAGNOSIS — I2510 Atherosclerotic heart disease of native coronary artery without angina pectoris: Secondary | ICD-10-CM | POA: Diagnosis not present

## 2018-07-04 DIAGNOSIS — I110 Hypertensive heart disease with heart failure: Secondary | ICD-10-CM | POA: Diagnosis not present

## 2018-07-04 DIAGNOSIS — M810 Age-related osteoporosis without current pathological fracture: Secondary | ICD-10-CM | POA: Diagnosis not present

## 2018-07-04 DIAGNOSIS — I509 Heart failure, unspecified: Secondary | ICD-10-CM | POA: Diagnosis not present

## 2018-07-08 DIAGNOSIS — I2510 Atherosclerotic heart disease of native coronary artery without angina pectoris: Secondary | ICD-10-CM | POA: Diagnosis not present

## 2018-07-08 DIAGNOSIS — Z7902 Long term (current) use of antithrombotics/antiplatelets: Secondary | ICD-10-CM | POA: Diagnosis not present

## 2018-07-08 DIAGNOSIS — E1142 Type 2 diabetes mellitus with diabetic polyneuropathy: Secondary | ICD-10-CM | POA: Diagnosis not present

## 2018-07-08 DIAGNOSIS — I110 Hypertensive heart disease with heart failure: Secondary | ICD-10-CM | POA: Diagnosis not present

## 2018-07-08 DIAGNOSIS — M810 Age-related osteoporosis without current pathological fracture: Secondary | ICD-10-CM | POA: Diagnosis not present

## 2018-07-08 DIAGNOSIS — I509 Heart failure, unspecified: Secondary | ICD-10-CM | POA: Diagnosis not present

## 2018-07-19 DIAGNOSIS — I110 Hypertensive heart disease with heart failure: Secondary | ICD-10-CM | POA: Diagnosis not present

## 2018-07-19 DIAGNOSIS — E1142 Type 2 diabetes mellitus with diabetic polyneuropathy: Secondary | ICD-10-CM | POA: Diagnosis not present

## 2018-07-19 DIAGNOSIS — I509 Heart failure, unspecified: Secondary | ICD-10-CM | POA: Diagnosis not present

## 2018-07-19 DIAGNOSIS — I2510 Atherosclerotic heart disease of native coronary artery without angina pectoris: Secondary | ICD-10-CM | POA: Diagnosis not present

## 2018-07-19 DIAGNOSIS — M810 Age-related osteoporosis without current pathological fracture: Secondary | ICD-10-CM | POA: Diagnosis not present

## 2018-07-19 DIAGNOSIS — Z7902 Long term (current) use of antithrombotics/antiplatelets: Secondary | ICD-10-CM | POA: Diagnosis not present

## 2018-07-23 DIAGNOSIS — I2510 Atherosclerotic heart disease of native coronary artery without angina pectoris: Secondary | ICD-10-CM | POA: Diagnosis not present

## 2018-07-23 DIAGNOSIS — I110 Hypertensive heart disease with heart failure: Secondary | ICD-10-CM | POA: Diagnosis not present

## 2018-07-23 DIAGNOSIS — I509 Heart failure, unspecified: Secondary | ICD-10-CM | POA: Diagnosis not present

## 2018-07-23 DIAGNOSIS — Z7902 Long term (current) use of antithrombotics/antiplatelets: Secondary | ICD-10-CM | POA: Diagnosis not present

## 2018-07-23 DIAGNOSIS — E1142 Type 2 diabetes mellitus with diabetic polyneuropathy: Secondary | ICD-10-CM | POA: Diagnosis not present

## 2018-07-23 DIAGNOSIS — M810 Age-related osteoporosis without current pathological fracture: Secondary | ICD-10-CM | POA: Diagnosis not present

## 2018-07-27 DIAGNOSIS — I2510 Atherosclerotic heart disease of native coronary artery without angina pectoris: Secondary | ICD-10-CM | POA: Diagnosis not present

## 2018-07-27 DIAGNOSIS — Z7902 Long term (current) use of antithrombotics/antiplatelets: Secondary | ICD-10-CM | POA: Diagnosis not present

## 2018-07-27 DIAGNOSIS — I110 Hypertensive heart disease with heart failure: Secondary | ICD-10-CM | POA: Diagnosis not present

## 2018-07-27 DIAGNOSIS — Z9181 History of falling: Secondary | ICD-10-CM | POA: Diagnosis not present

## 2018-07-27 DIAGNOSIS — Z87440 Personal history of urinary (tract) infections: Secondary | ICD-10-CM | POA: Diagnosis not present

## 2018-07-27 DIAGNOSIS — I509 Heart failure, unspecified: Secondary | ICD-10-CM | POA: Diagnosis not present

## 2018-07-27 DIAGNOSIS — E1142 Type 2 diabetes mellitus with diabetic polyneuropathy: Secondary | ICD-10-CM | POA: Diagnosis not present

## 2018-07-27 DIAGNOSIS — Z8673 Personal history of transient ischemic attack (TIA), and cerebral infarction without residual deficits: Secondary | ICD-10-CM | POA: Diagnosis not present

## 2018-07-27 DIAGNOSIS — Z794 Long term (current) use of insulin: Secondary | ICD-10-CM | POA: Diagnosis not present

## 2018-07-27 DIAGNOSIS — M810 Age-related osteoporosis without current pathological fracture: Secondary | ICD-10-CM | POA: Diagnosis not present

## 2018-07-29 DIAGNOSIS — E1142 Type 2 diabetes mellitus with diabetic polyneuropathy: Secondary | ICD-10-CM | POA: Diagnosis not present

## 2018-07-29 DIAGNOSIS — I2510 Atherosclerotic heart disease of native coronary artery without angina pectoris: Secondary | ICD-10-CM | POA: Diagnosis not present

## 2018-07-29 DIAGNOSIS — I110 Hypertensive heart disease with heart failure: Secondary | ICD-10-CM | POA: Diagnosis not present

## 2018-07-29 DIAGNOSIS — Z7902 Long term (current) use of antithrombotics/antiplatelets: Secondary | ICD-10-CM | POA: Diagnosis not present

## 2018-07-29 DIAGNOSIS — I509 Heart failure, unspecified: Secondary | ICD-10-CM | POA: Diagnosis not present

## 2018-07-29 DIAGNOSIS — M810 Age-related osteoporosis without current pathological fracture: Secondary | ICD-10-CM | POA: Diagnosis not present

## 2018-08-01 DIAGNOSIS — D519 Vitamin B12 deficiency anemia, unspecified: Secondary | ICD-10-CM | POA: Diagnosis not present

## 2018-08-05 DIAGNOSIS — M810 Age-related osteoporosis without current pathological fracture: Secondary | ICD-10-CM | POA: Diagnosis not present

## 2018-08-05 DIAGNOSIS — E785 Hyperlipidemia, unspecified: Secondary | ICD-10-CM | POA: Diagnosis not present

## 2018-08-05 DIAGNOSIS — I110 Hypertensive heart disease with heart failure: Secondary | ICD-10-CM | POA: Diagnosis not present

## 2018-08-05 DIAGNOSIS — Z7902 Long term (current) use of antithrombotics/antiplatelets: Secondary | ICD-10-CM | POA: Diagnosis not present

## 2018-08-05 DIAGNOSIS — I6990 Unspecified sequelae of unspecified cerebrovascular disease: Secondary | ICD-10-CM | POA: Diagnosis not present

## 2018-08-05 DIAGNOSIS — E1140 Type 2 diabetes mellitus with diabetic neuropathy, unspecified: Secondary | ICD-10-CM | POA: Diagnosis not present

## 2018-08-05 DIAGNOSIS — I2510 Atherosclerotic heart disease of native coronary artery without angina pectoris: Secondary | ICD-10-CM | POA: Diagnosis not present

## 2018-08-05 DIAGNOSIS — I509 Heart failure, unspecified: Secondary | ICD-10-CM | POA: Diagnosis not present

## 2018-08-05 DIAGNOSIS — D519 Vitamin B12 deficiency anemia, unspecified: Secondary | ICD-10-CM | POA: Diagnosis not present

## 2018-08-05 DIAGNOSIS — E1142 Type 2 diabetes mellitus with diabetic polyneuropathy: Secondary | ICD-10-CM | POA: Diagnosis not present

## 2018-08-12 DIAGNOSIS — M810 Age-related osteoporosis without current pathological fracture: Secondary | ICD-10-CM | POA: Diagnosis not present

## 2018-08-12 DIAGNOSIS — E1142 Type 2 diabetes mellitus with diabetic polyneuropathy: Secondary | ICD-10-CM | POA: Diagnosis not present

## 2018-08-12 DIAGNOSIS — Z7902 Long term (current) use of antithrombotics/antiplatelets: Secondary | ICD-10-CM | POA: Diagnosis not present

## 2018-08-12 DIAGNOSIS — I2510 Atherosclerotic heart disease of native coronary artery without angina pectoris: Secondary | ICD-10-CM | POA: Diagnosis not present

## 2018-08-12 DIAGNOSIS — I110 Hypertensive heart disease with heart failure: Secondary | ICD-10-CM | POA: Diagnosis not present

## 2018-08-12 DIAGNOSIS — I509 Heart failure, unspecified: Secondary | ICD-10-CM | POA: Diagnosis not present

## 2018-08-22 DIAGNOSIS — I2510 Atherosclerotic heart disease of native coronary artery without angina pectoris: Secondary | ICD-10-CM | POA: Diagnosis not present

## 2018-08-22 DIAGNOSIS — I110 Hypertensive heart disease with heart failure: Secondary | ICD-10-CM | POA: Diagnosis not present

## 2018-08-22 DIAGNOSIS — E1142 Type 2 diabetes mellitus with diabetic polyneuropathy: Secondary | ICD-10-CM | POA: Diagnosis not present

## 2018-08-22 DIAGNOSIS — M810 Age-related osteoporosis without current pathological fracture: Secondary | ICD-10-CM | POA: Diagnosis not present

## 2018-08-22 DIAGNOSIS — I509 Heart failure, unspecified: Secondary | ICD-10-CM | POA: Diagnosis not present

## 2018-08-22 DIAGNOSIS — Z7902 Long term (current) use of antithrombotics/antiplatelets: Secondary | ICD-10-CM | POA: Diagnosis not present

## 2018-08-26 DIAGNOSIS — Z8673 Personal history of transient ischemic attack (TIA), and cerebral infarction without residual deficits: Secondary | ICD-10-CM | POA: Diagnosis not present

## 2018-08-26 DIAGNOSIS — Z7902 Long term (current) use of antithrombotics/antiplatelets: Secondary | ICD-10-CM | POA: Diagnosis not present

## 2018-08-26 DIAGNOSIS — I509 Heart failure, unspecified: Secondary | ICD-10-CM | POA: Diagnosis not present

## 2018-08-26 DIAGNOSIS — I2510 Atherosclerotic heart disease of native coronary artery without angina pectoris: Secondary | ICD-10-CM | POA: Diagnosis not present

## 2018-08-26 DIAGNOSIS — Z87440 Personal history of urinary (tract) infections: Secondary | ICD-10-CM | POA: Diagnosis not present

## 2018-08-26 DIAGNOSIS — I110 Hypertensive heart disease with heart failure: Secondary | ICD-10-CM | POA: Diagnosis not present

## 2018-08-26 DIAGNOSIS — Z794 Long term (current) use of insulin: Secondary | ICD-10-CM | POA: Diagnosis not present

## 2018-08-26 DIAGNOSIS — Z9181 History of falling: Secondary | ICD-10-CM | POA: Diagnosis not present

## 2018-08-26 DIAGNOSIS — E1142 Type 2 diabetes mellitus with diabetic polyneuropathy: Secondary | ICD-10-CM | POA: Diagnosis not present

## 2018-08-26 DIAGNOSIS — M810 Age-related osteoporosis without current pathological fracture: Secondary | ICD-10-CM | POA: Diagnosis not present

## 2018-09-05 DIAGNOSIS — I2510 Atherosclerotic heart disease of native coronary artery without angina pectoris: Secondary | ICD-10-CM | POA: Diagnosis not present

## 2018-09-05 DIAGNOSIS — I509 Heart failure, unspecified: Secondary | ICD-10-CM | POA: Diagnosis not present

## 2018-09-05 DIAGNOSIS — M810 Age-related osteoporosis without current pathological fracture: Secondary | ICD-10-CM | POA: Diagnosis not present

## 2018-09-05 DIAGNOSIS — Z7902 Long term (current) use of antithrombotics/antiplatelets: Secondary | ICD-10-CM | POA: Diagnosis not present

## 2018-09-05 DIAGNOSIS — E1142 Type 2 diabetes mellitus with diabetic polyneuropathy: Secondary | ICD-10-CM | POA: Diagnosis not present

## 2018-09-05 DIAGNOSIS — I110 Hypertensive heart disease with heart failure: Secondary | ICD-10-CM | POA: Diagnosis not present

## 2018-09-10 DIAGNOSIS — I110 Hypertensive heart disease with heart failure: Secondary | ICD-10-CM | POA: Diagnosis not present

## 2018-09-10 DIAGNOSIS — I509 Heart failure, unspecified: Secondary | ICD-10-CM | POA: Diagnosis not present

## 2018-09-10 DIAGNOSIS — M810 Age-related osteoporosis without current pathological fracture: Secondary | ICD-10-CM | POA: Diagnosis not present

## 2018-09-10 DIAGNOSIS — Z7902 Long term (current) use of antithrombotics/antiplatelets: Secondary | ICD-10-CM | POA: Diagnosis not present

## 2018-09-10 DIAGNOSIS — E1142 Type 2 diabetes mellitus with diabetic polyneuropathy: Secondary | ICD-10-CM | POA: Diagnosis not present

## 2018-09-10 DIAGNOSIS — I2510 Atherosclerotic heart disease of native coronary artery without angina pectoris: Secondary | ICD-10-CM | POA: Diagnosis not present

## 2018-09-20 DIAGNOSIS — E1140 Type 2 diabetes mellitus with diabetic neuropathy, unspecified: Secondary | ICD-10-CM | POA: Diagnosis not present

## 2018-09-24 DIAGNOSIS — I2510 Atherosclerotic heart disease of native coronary artery without angina pectoris: Secondary | ICD-10-CM | POA: Diagnosis not present

## 2018-09-24 DIAGNOSIS — E1142 Type 2 diabetes mellitus with diabetic polyneuropathy: Secondary | ICD-10-CM | POA: Diagnosis not present

## 2018-09-24 DIAGNOSIS — M810 Age-related osteoporosis without current pathological fracture: Secondary | ICD-10-CM | POA: Diagnosis not present

## 2018-09-24 DIAGNOSIS — Z7902 Long term (current) use of antithrombotics/antiplatelets: Secondary | ICD-10-CM | POA: Diagnosis not present

## 2018-09-24 DIAGNOSIS — I110 Hypertensive heart disease with heart failure: Secondary | ICD-10-CM | POA: Diagnosis not present

## 2018-09-24 DIAGNOSIS — I509 Heart failure, unspecified: Secondary | ICD-10-CM | POA: Diagnosis not present

## 2018-09-25 DIAGNOSIS — Z7902 Long term (current) use of antithrombotics/antiplatelets: Secondary | ICD-10-CM | POA: Diagnosis not present

## 2018-09-25 DIAGNOSIS — Z87440 Personal history of urinary (tract) infections: Secondary | ICD-10-CM | POA: Diagnosis not present

## 2018-09-25 DIAGNOSIS — Z8673 Personal history of transient ischemic attack (TIA), and cerebral infarction without residual deficits: Secondary | ICD-10-CM | POA: Diagnosis not present

## 2018-09-25 DIAGNOSIS — E1142 Type 2 diabetes mellitus with diabetic polyneuropathy: Secondary | ICD-10-CM | POA: Diagnosis not present

## 2018-09-25 DIAGNOSIS — M810 Age-related osteoporosis without current pathological fracture: Secondary | ICD-10-CM | POA: Diagnosis not present

## 2018-09-25 DIAGNOSIS — I110 Hypertensive heart disease with heart failure: Secondary | ICD-10-CM | POA: Diagnosis not present

## 2018-09-25 DIAGNOSIS — I509 Heart failure, unspecified: Secondary | ICD-10-CM | POA: Diagnosis not present

## 2018-09-25 DIAGNOSIS — Z794 Long term (current) use of insulin: Secondary | ICD-10-CM | POA: Diagnosis not present

## 2018-09-25 DIAGNOSIS — Z9181 History of falling: Secondary | ICD-10-CM | POA: Diagnosis not present

## 2018-09-25 DIAGNOSIS — I2510 Atherosclerotic heart disease of native coronary artery without angina pectoris: Secondary | ICD-10-CM | POA: Diagnosis not present

## 2018-10-03 DIAGNOSIS — I2510 Atherosclerotic heart disease of native coronary artery without angina pectoris: Secondary | ICD-10-CM | POA: Diagnosis not present

## 2018-10-03 DIAGNOSIS — M810 Age-related osteoporosis without current pathological fracture: Secondary | ICD-10-CM | POA: Diagnosis not present

## 2018-10-03 DIAGNOSIS — E1142 Type 2 diabetes mellitus with diabetic polyneuropathy: Secondary | ICD-10-CM | POA: Diagnosis not present

## 2018-10-03 DIAGNOSIS — I509 Heart failure, unspecified: Secondary | ICD-10-CM | POA: Diagnosis not present

## 2018-10-03 DIAGNOSIS — Z7902 Long term (current) use of antithrombotics/antiplatelets: Secondary | ICD-10-CM | POA: Diagnosis not present

## 2018-10-03 DIAGNOSIS — I110 Hypertensive heart disease with heart failure: Secondary | ICD-10-CM | POA: Diagnosis not present

## 2018-10-10 DIAGNOSIS — E1142 Type 2 diabetes mellitus with diabetic polyneuropathy: Secondary | ICD-10-CM | POA: Diagnosis not present

## 2018-10-10 DIAGNOSIS — M810 Age-related osteoporosis without current pathological fracture: Secondary | ICD-10-CM | POA: Diagnosis not present

## 2018-10-10 DIAGNOSIS — I2510 Atherosclerotic heart disease of native coronary artery without angina pectoris: Secondary | ICD-10-CM | POA: Diagnosis not present

## 2018-10-10 DIAGNOSIS — I509 Heart failure, unspecified: Secondary | ICD-10-CM | POA: Diagnosis not present

## 2018-10-10 DIAGNOSIS — I110 Hypertensive heart disease with heart failure: Secondary | ICD-10-CM | POA: Diagnosis not present

## 2018-10-10 DIAGNOSIS — Z7902 Long term (current) use of antithrombotics/antiplatelets: Secondary | ICD-10-CM | POA: Diagnosis not present

## 2018-10-16 DIAGNOSIS — Z7902 Long term (current) use of antithrombotics/antiplatelets: Secondary | ICD-10-CM | POA: Diagnosis not present

## 2018-10-16 DIAGNOSIS — M810 Age-related osteoporosis without current pathological fracture: Secondary | ICD-10-CM | POA: Diagnosis not present

## 2018-10-16 DIAGNOSIS — I110 Hypertensive heart disease with heart failure: Secondary | ICD-10-CM | POA: Diagnosis not present

## 2018-10-16 DIAGNOSIS — E1142 Type 2 diabetes mellitus with diabetic polyneuropathy: Secondary | ICD-10-CM | POA: Diagnosis not present

## 2018-10-16 DIAGNOSIS — I2510 Atherosclerotic heart disease of native coronary artery without angina pectoris: Secondary | ICD-10-CM | POA: Diagnosis not present

## 2018-10-16 DIAGNOSIS — I509 Heart failure, unspecified: Secondary | ICD-10-CM | POA: Diagnosis not present

## 2018-10-22 DIAGNOSIS — I509 Heart failure, unspecified: Secondary | ICD-10-CM | POA: Diagnosis not present

## 2018-10-22 DIAGNOSIS — Z1331 Encounter for screening for depression: Secondary | ICD-10-CM | POA: Diagnosis not present

## 2018-10-22 DIAGNOSIS — Z9181 History of falling: Secondary | ICD-10-CM | POA: Diagnosis not present

## 2018-10-22 DIAGNOSIS — Z1211 Encounter for screening for malignant neoplasm of colon: Secondary | ICD-10-CM | POA: Diagnosis not present

## 2018-10-22 DIAGNOSIS — Z7902 Long term (current) use of antithrombotics/antiplatelets: Secondary | ICD-10-CM | POA: Diagnosis not present

## 2018-10-22 DIAGNOSIS — M810 Age-related osteoporosis without current pathological fracture: Secondary | ICD-10-CM | POA: Diagnosis not present

## 2018-10-22 DIAGNOSIS — I2510 Atherosclerotic heart disease of native coronary artery without angina pectoris: Secondary | ICD-10-CM | POA: Diagnosis not present

## 2018-10-22 DIAGNOSIS — E1142 Type 2 diabetes mellitus with diabetic polyneuropathy: Secondary | ICD-10-CM | POA: Diagnosis not present

## 2018-10-22 DIAGNOSIS — N959 Unspecified menopausal and perimenopausal disorder: Secondary | ICD-10-CM | POA: Diagnosis not present

## 2018-10-22 DIAGNOSIS — E785 Hyperlipidemia, unspecified: Secondary | ICD-10-CM | POA: Diagnosis not present

## 2018-10-22 DIAGNOSIS — I110 Hypertensive heart disease with heart failure: Secondary | ICD-10-CM | POA: Diagnosis not present

## 2018-10-22 DIAGNOSIS — Z0000 Encounter for general adult medical examination without abnormal findings: Secondary | ICD-10-CM | POA: Diagnosis not present

## 2018-10-22 DIAGNOSIS — Z1231 Encounter for screening mammogram for malignant neoplasm of breast: Secondary | ICD-10-CM | POA: Diagnosis not present

## 2018-10-22 DIAGNOSIS — Z6834 Body mass index (BMI) 34.0-34.9, adult: Secondary | ICD-10-CM | POA: Diagnosis not present

## 2018-10-25 DIAGNOSIS — Z794 Long term (current) use of insulin: Secondary | ICD-10-CM | POA: Diagnosis not present

## 2018-10-25 DIAGNOSIS — E1142 Type 2 diabetes mellitus with diabetic polyneuropathy: Secondary | ICD-10-CM | POA: Diagnosis not present

## 2018-10-25 DIAGNOSIS — M810 Age-related osteoporosis without current pathological fracture: Secondary | ICD-10-CM | POA: Diagnosis not present

## 2018-10-25 DIAGNOSIS — I509 Heart failure, unspecified: Secondary | ICD-10-CM | POA: Diagnosis not present

## 2018-10-25 DIAGNOSIS — Z87440 Personal history of urinary (tract) infections: Secondary | ICD-10-CM | POA: Diagnosis not present

## 2018-10-25 DIAGNOSIS — Z8673 Personal history of transient ischemic attack (TIA), and cerebral infarction without residual deficits: Secondary | ICD-10-CM | POA: Diagnosis not present

## 2018-10-25 DIAGNOSIS — Z7902 Long term (current) use of antithrombotics/antiplatelets: Secondary | ICD-10-CM | POA: Diagnosis not present

## 2018-10-25 DIAGNOSIS — I110 Hypertensive heart disease with heart failure: Secondary | ICD-10-CM | POA: Diagnosis not present

## 2018-10-25 DIAGNOSIS — Z9181 History of falling: Secondary | ICD-10-CM | POA: Diagnosis not present

## 2018-10-25 DIAGNOSIS — I2510 Atherosclerotic heart disease of native coronary artery without angina pectoris: Secondary | ICD-10-CM | POA: Diagnosis not present

## 2018-10-28 DIAGNOSIS — M810 Age-related osteoporosis without current pathological fracture: Secondary | ICD-10-CM | POA: Diagnosis not present

## 2018-10-28 DIAGNOSIS — I509 Heart failure, unspecified: Secondary | ICD-10-CM | POA: Diagnosis not present

## 2018-10-28 DIAGNOSIS — Z7902 Long term (current) use of antithrombotics/antiplatelets: Secondary | ICD-10-CM | POA: Diagnosis not present

## 2018-10-28 DIAGNOSIS — E1142 Type 2 diabetes mellitus with diabetic polyneuropathy: Secondary | ICD-10-CM | POA: Diagnosis not present

## 2018-10-28 DIAGNOSIS — I2510 Atherosclerotic heart disease of native coronary artery without angina pectoris: Secondary | ICD-10-CM | POA: Diagnosis not present

## 2018-10-28 DIAGNOSIS — I110 Hypertensive heart disease with heart failure: Secondary | ICD-10-CM | POA: Diagnosis not present

## 2018-10-29 DIAGNOSIS — I110 Hypertensive heart disease with heart failure: Secondary | ICD-10-CM | POA: Diagnosis not present

## 2018-10-29 DIAGNOSIS — I509 Heart failure, unspecified: Secondary | ICD-10-CM | POA: Diagnosis not present

## 2018-10-29 DIAGNOSIS — E1142 Type 2 diabetes mellitus with diabetic polyneuropathy: Secondary | ICD-10-CM | POA: Diagnosis not present

## 2018-10-29 DIAGNOSIS — I2510 Atherosclerotic heart disease of native coronary artery without angina pectoris: Secondary | ICD-10-CM | POA: Diagnosis not present

## 2018-10-29 DIAGNOSIS — M810 Age-related osteoporosis without current pathological fracture: Secondary | ICD-10-CM | POA: Diagnosis not present

## 2018-10-29 DIAGNOSIS — Z7902 Long term (current) use of antithrombotics/antiplatelets: Secondary | ICD-10-CM | POA: Diagnosis not present

## 2018-10-30 DIAGNOSIS — M810 Age-related osteoporosis without current pathological fracture: Secondary | ICD-10-CM | POA: Diagnosis not present

## 2018-10-30 DIAGNOSIS — N959 Unspecified menopausal and perimenopausal disorder: Secondary | ICD-10-CM | POA: Diagnosis not present

## 2018-10-30 DIAGNOSIS — Z1231 Encounter for screening mammogram for malignant neoplasm of breast: Secondary | ICD-10-CM | POA: Diagnosis not present

## 2018-11-05 DIAGNOSIS — M810 Age-related osteoporosis without current pathological fracture: Secondary | ICD-10-CM | POA: Diagnosis not present

## 2018-11-05 DIAGNOSIS — Z7902 Long term (current) use of antithrombotics/antiplatelets: Secondary | ICD-10-CM | POA: Diagnosis not present

## 2018-11-05 DIAGNOSIS — I2510 Atherosclerotic heart disease of native coronary artery without angina pectoris: Secondary | ICD-10-CM | POA: Diagnosis not present

## 2018-11-05 DIAGNOSIS — E1142 Type 2 diabetes mellitus with diabetic polyneuropathy: Secondary | ICD-10-CM | POA: Diagnosis not present

## 2018-11-05 DIAGNOSIS — I509 Heart failure, unspecified: Secondary | ICD-10-CM | POA: Diagnosis not present

## 2018-11-05 DIAGNOSIS — I110 Hypertensive heart disease with heart failure: Secondary | ICD-10-CM | POA: Diagnosis not present

## 2018-11-07 DIAGNOSIS — M1711 Unilateral primary osteoarthritis, right knee: Secondary | ICD-10-CM | POA: Diagnosis not present

## 2018-11-07 DIAGNOSIS — D519 Vitamin B12 deficiency anemia, unspecified: Secondary | ICD-10-CM | POA: Diagnosis not present

## 2018-11-07 DIAGNOSIS — I110 Hypertensive heart disease with heart failure: Secondary | ICD-10-CM | POA: Diagnosis not present

## 2018-11-07 DIAGNOSIS — E1140 Type 2 diabetes mellitus with diabetic neuropathy, unspecified: Secondary | ICD-10-CM | POA: Diagnosis not present

## 2018-11-07 DIAGNOSIS — I6990 Unspecified sequelae of unspecified cerebrovascular disease: Secondary | ICD-10-CM | POA: Diagnosis not present

## 2018-11-07 DIAGNOSIS — E785 Hyperlipidemia, unspecified: Secondary | ICD-10-CM | POA: Diagnosis not present

## 2018-11-07 DIAGNOSIS — Z139 Encounter for screening, unspecified: Secondary | ICD-10-CM | POA: Diagnosis not present

## 2018-11-07 DIAGNOSIS — E1165 Type 2 diabetes mellitus with hyperglycemia: Secondary | ICD-10-CM | POA: Diagnosis not present

## 2018-11-11 DIAGNOSIS — I509 Heart failure, unspecified: Secondary | ICD-10-CM | POA: Diagnosis not present

## 2018-11-11 DIAGNOSIS — I2510 Atherosclerotic heart disease of native coronary artery without angina pectoris: Secondary | ICD-10-CM | POA: Diagnosis not present

## 2018-11-11 DIAGNOSIS — I110 Hypertensive heart disease with heart failure: Secondary | ICD-10-CM | POA: Diagnosis not present

## 2018-11-11 DIAGNOSIS — Z7902 Long term (current) use of antithrombotics/antiplatelets: Secondary | ICD-10-CM | POA: Diagnosis not present

## 2018-11-11 DIAGNOSIS — M810 Age-related osteoporosis without current pathological fracture: Secondary | ICD-10-CM | POA: Diagnosis not present

## 2018-11-11 DIAGNOSIS — E1142 Type 2 diabetes mellitus with diabetic polyneuropathy: Secondary | ICD-10-CM | POA: Diagnosis not present

## 2018-11-15 ENCOUNTER — Inpatient Hospital Stay (HOSPITAL_COMMUNITY)
Admission: EM | Admit: 2018-11-15 | Discharge: 2018-11-18 | DRG: 065 | Disposition: A | Discharge status: Home Health | Payer: Medicare Other | Source: Other Acute Inpatient Hospital | Attending: Neurology | Admitting: Neurology

## 2018-11-15 DIAGNOSIS — R29707 NIHSS score 7: Secondary | ICD-10-CM | POA: Diagnosis present

## 2018-11-15 DIAGNOSIS — Z6837 Body mass index (BMI) 37.0-37.9, adult: Secondary | ICD-10-CM

## 2018-11-15 DIAGNOSIS — I6521 Occlusion and stenosis of right carotid artery: Secondary | ICD-10-CM | POA: Diagnosis present

## 2018-11-15 DIAGNOSIS — I110 Hypertensive heart disease with heart failure: Secondary | ICD-10-CM | POA: Diagnosis present

## 2018-11-15 DIAGNOSIS — E785 Hyperlipidemia, unspecified: Secondary | ICD-10-CM | POA: Diagnosis present

## 2018-11-15 DIAGNOSIS — I63233 Cerebral infarction due to unspecified occlusion or stenosis of bilateral carotid arteries: Secondary | ICD-10-CM | POA: Diagnosis not present

## 2018-11-15 DIAGNOSIS — I63131 Cerebral infarction due to embolism of right carotid artery: Principal | ICD-10-CM | POA: Diagnosis present

## 2018-11-15 DIAGNOSIS — Z7983 Long term (current) use of bisphosphonates: Secondary | ICD-10-CM | POA: Diagnosis not present

## 2018-11-15 DIAGNOSIS — E11649 Type 2 diabetes mellitus with hypoglycemia without coma: Secondary | ICD-10-CM | POA: Diagnosis present

## 2018-11-15 DIAGNOSIS — Z90710 Acquired absence of both cervix and uterus: Secondary | ICD-10-CM | POA: Diagnosis not present

## 2018-11-15 DIAGNOSIS — Z7289 Other problems related to lifestyle: Secondary | ICD-10-CM

## 2018-11-15 DIAGNOSIS — Z8042 Family history of malignant neoplasm of prostate: Secondary | ICD-10-CM

## 2018-11-15 DIAGNOSIS — Z823 Family history of stroke: Secondary | ICD-10-CM

## 2018-11-15 DIAGNOSIS — Z8249 Family history of ischemic heart disease and other diseases of the circulatory system: Secondary | ICD-10-CM | POA: Diagnosis not present

## 2018-11-15 DIAGNOSIS — E1165 Type 2 diabetes mellitus with hyperglycemia: Secondary | ICD-10-CM | POA: Diagnosis present

## 2018-11-15 DIAGNOSIS — Z8673 Personal history of transient ischemic attack (TIA), and cerebral infarction without residual deficits: Secondary | ICD-10-CM | POA: Diagnosis not present

## 2018-11-15 DIAGNOSIS — R200 Anesthesia of skin: Secondary | ICD-10-CM | POA: Diagnosis present

## 2018-11-15 DIAGNOSIS — I6350 Cerebral infarction due to unspecified occlusion or stenosis of unspecified cerebral artery: Secondary | ICD-10-CM | POA: Diagnosis not present

## 2018-11-15 DIAGNOSIS — I509 Heart failure, unspecified: Secondary | ICD-10-CM | POA: Diagnosis not present

## 2018-11-15 DIAGNOSIS — I5032 Chronic diastolic (congestive) heart failure: Secondary | ICD-10-CM | POA: Diagnosis present

## 2018-11-15 DIAGNOSIS — Z713 Dietary counseling and surveillance: Secondary | ICD-10-CM | POA: Diagnosis not present

## 2018-11-15 DIAGNOSIS — J449 Chronic obstructive pulmonary disease, unspecified: Secondary | ICD-10-CM | POA: Diagnosis not present

## 2018-11-15 DIAGNOSIS — R29709 NIHSS score 9: Secondary | ICD-10-CM | POA: Diagnosis not present

## 2018-11-15 DIAGNOSIS — R29818 Other symptoms and signs involving the nervous system: Secondary | ICD-10-CM | POA: Diagnosis not present

## 2018-11-15 DIAGNOSIS — I63231 Cerebral infarction due to unspecified occlusion or stenosis of right carotid arteries: Secondary | ICD-10-CM | POA: Diagnosis not present

## 2018-11-15 DIAGNOSIS — I639 Cerebral infarction, unspecified: Secondary | ICD-10-CM

## 2018-11-15 DIAGNOSIS — R531 Weakness: Secondary | ICD-10-CM | POA: Diagnosis not present

## 2018-11-15 DIAGNOSIS — R4781 Slurred speech: Secondary | ICD-10-CM | POA: Diagnosis present

## 2018-11-15 DIAGNOSIS — Z794 Long term (current) use of insulin: Secondary | ICD-10-CM

## 2018-11-15 DIAGNOSIS — Z9282 Status post administration of tPA (rtPA) in a different facility within the last 24 hours prior to admission to current facility: Secondary | ICD-10-CM

## 2018-11-15 DIAGNOSIS — Z7902 Long term (current) use of antithrombotics/antiplatelets: Secondary | ICD-10-CM | POA: Diagnosis not present

## 2018-11-15 DIAGNOSIS — E1159 Type 2 diabetes mellitus with other circulatory complications: Secondary | ICD-10-CM | POA: Diagnosis not present

## 2018-11-15 DIAGNOSIS — I340 Nonrheumatic mitral (valve) insufficiency: Secondary | ICD-10-CM | POA: Diagnosis not present

## 2018-11-15 DIAGNOSIS — R29810 Facial weakness: Secondary | ICD-10-CM | POA: Diagnosis present

## 2018-11-15 DIAGNOSIS — E669 Obesity, unspecified: Secondary | ICD-10-CM | POA: Diagnosis present

## 2018-11-15 DIAGNOSIS — R000 Tachycardia, unspecified: Secondary | ICD-10-CM | POA: Diagnosis not present

## 2018-11-15 DIAGNOSIS — I6782 Cerebral ischemia: Secondary | ICD-10-CM | POA: Diagnosis not present

## 2018-11-15 DIAGNOSIS — E876 Hypokalemia: Secondary | ICD-10-CM | POA: Diagnosis present

## 2018-11-15 DIAGNOSIS — E1142 Type 2 diabetes mellitus with diabetic polyneuropathy: Secondary | ICD-10-CM | POA: Diagnosis not present

## 2018-11-15 DIAGNOSIS — I6501 Occlusion and stenosis of right vertebral artery: Secondary | ICD-10-CM | POA: Diagnosis not present

## 2018-11-15 DIAGNOSIS — E119 Type 2 diabetes mellitus without complications: Secondary | ICD-10-CM | POA: Diagnosis not present

## 2018-11-15 DIAGNOSIS — I10 Essential (primary) hypertension: Secondary | ICD-10-CM | POA: Diagnosis not present

## 2018-11-15 DIAGNOSIS — R739 Hyperglycemia, unspecified: Secondary | ICD-10-CM | POA: Diagnosis not present

## 2018-11-15 DIAGNOSIS — IMO0002 Reserved for concepts with insufficient information to code with codable children: Secondary | ICD-10-CM | POA: Diagnosis present

## 2018-11-15 DIAGNOSIS — Z79899 Other long term (current) drug therapy: Secondary | ICD-10-CM

## 2018-11-15 HISTORY — DX: Cerebral infarction due to unspecified occlusion or stenosis of right carotid arteries: I63.231

## 2018-11-15 LAB — GLUCOSE, CAPILLARY
Glucose-Capillary: 122 mg/dL — ABNORMAL HIGH (ref 70–99)
Glucose-Capillary: 151 mg/dL — ABNORMAL HIGH (ref 70–99)

## 2018-11-15 LAB — MRSA PCR SCREENING: MRSA by PCR: POSITIVE — AB

## 2018-11-15 MED ORDER — ACETAMINOPHEN 325 MG PO TABS
650.0000 mg | ORAL_TABLET | ORAL | Status: DC | PRN
  Filled 2018-11-15: qty 2

## 2018-11-15 MED ORDER — CHLORHEXIDINE GLUCONATE CLOTH 2 % EX PADS
6.0000 | MEDICATED_PAD | Freq: Every day | CUTANEOUS | Status: DC
  Administered 2018-11-15 – 2018-11-18 (×4): 6 via TOPICAL

## 2018-11-15 MED ORDER — STROKE: EARLY STAGES OF RECOVERY BOOK
Freq: Once | Status: AC
  Administered 2018-11-15: 23:00:00
  Filled 2018-11-15: qty 1

## 2018-11-15 MED ORDER — SENNOSIDES-DOCUSATE SODIUM 8.6-50 MG PO TABS: 1.0000 | ORAL_TABLET | Freq: Every evening | ORAL | Status: DC | PRN

## 2018-11-15 MED ORDER — SODIUM CHLORIDE 0.9 % IV SOLN
INTRAVENOUS | Status: DC
  Administered 2018-11-15 – 2018-11-16 (×2): via INTRAVENOUS

## 2018-11-15 MED ORDER — ACETAMINOPHEN 160 MG/5ML PO SOLN: 650.0000 mg | ORAL | Status: DC | PRN

## 2018-11-15 MED ORDER — ACETAMINOPHEN 650 MG RE SUPP: 650.0000 mg | RECTAL | Status: DC | PRN

## 2018-11-15 MED ORDER — PANTOPRAZOLE SODIUM 40 MG IV SOLR
40.0000 mg | Freq: Every day | INTRAVENOUS | Status: DC
  Administered 2018-11-15 – 2018-11-17 (×3): 40 mg via INTRAVENOUS
  Filled 2018-11-15 (×3): qty 40

## 2018-11-15 MED ORDER — MUPIROCIN 2 % EX OINT
1.0000 "application " | TOPICAL_OINTMENT | Freq: Two times a day (BID) | CUTANEOUS | Status: DC
  Administered 2018-11-16 – 2018-11-18 (×5): 1 via NASAL
  Filled 2018-11-15 (×2): qty 22

## 2018-11-15 NOTE — Progress Notes (Signed)
MD paged to notify that patient has arrived.

## 2018-11-15 NOTE — H&P (Signed)
Chief Complaint: Left-sided weakness  History obtained from: Patient and Chart     HPI:                                                                                                                                       [name] is a 73 y.o. female with past medical history of hypertension, diabetes, stroke [facility] room with sudden onset left-sided facial weakness and numbness, slurred speech that began at 12:30 PM today  [facility] and received IV TPA after negative CT head.  Patient is on Plavix at home.  [facility] management after discussion with Dr. Amada JupiterKirkpatrick.   Patient arrived to the neuro ICU around 10 PM, symptoms had significantly improved.  Patient's blood pressure within 180 systolic.  [facility] included CT head: No hemorrhage aspects 10 CT angiogram showed no intracranial stenosis-apart from a P1 stenosis, 70% stenosis of the right ICA bulb and 30% on the left ICA at the bulb.  Has 50% stenosis of the right vertebral artery at the origin. C CBC: Leukocytosis with WBC count of 14.6.  Hemoglobin 14.5 and platelet count 246 INR was 1.0 sodium was 138 potassium was 2.9  Date last known well: 7.10-20 Time last known well: 12:30 PM tPA Given: Yes NIHSS:  Baseline MRS  Past Medical History:  Diagnosis Date  . CHF (congestive heart failure) (HCC)   . Diabetes mellitus without complication (HCC)   . Hyperlipidemia   . Hypertension   . Stroke [facility](HCC)     Past Surgical History:  Procedure Laterality Date  . ABDOMINAL HYSTERECTOMY    . APPENDECTOMY    . CARDIAC CATHETERIZATION      Family History  Problem Relation Age of Onset  . Stroke Mother   . Prostate cancer Father   . CAD Sister   . CAD Sister   . Valvular heart disease Sister    Social History:  reports that she has never smoked. She has never used smokeless tobacco. She reports previous  alcohol use. She reports that she does not use drugs.  Allergies: No Known Allergies  Medications:                                                                                                                        I  reviewed home medications   ROS:                                                                                                                                     14 systems reviewed and negative except above    Examination:                                                                                                      General: Appears well-developed  Psych: Affect appropriate to situation Eyes: No scleral injection HENT: No OP obstrucion Head: Normocephalic.  Cardiovascular: Normal rate and regular rhythm.  Respiratory: Effort normal and breath sounds normal to anterior ascultation GI: Soft.  No distension. There is no tenderness.  Skin: WDI    Neurological Examination Mental Status: Alert, oriented, thought content appropriate.  Speech fluent without evidence of aphasia. Able to follow 3 step commands without difficulty. Cranial Nerves: II: Visual fields grossly normal,  III,IV, VI: ptosis not present, extra-ocular motions intact bilaterally, pupils equal, round, reactive to light and accommodation V,VII: smile symmetric, facial light touch sensation normal bilaterally VIII: hearing normal bilaterally IX,X: uvula rises symmetrically XI: bilateral shoulder shrug XII: midline tongue extension Motor: Right : Upper extremity   5/5    Left:     Upper extremity   4+/5  Lower extremity   5/5     Lower extremity   5/5 Tone and bulk:normal tone throughout; no atrophy noted Sensory: Reduced sensation on the left arm and leg to light touch, no neglect Deep Tendon Reflexes: symmetric throughout Plantars: Right: downgoing   Left: downgoing Cerebellar: normal finger-to-nose, normal rapid alternating movements and normal heel-to-shin test Gait: normal gait and  station     Lab Results: Basic Metabolic Panel: No results for input(s): NA, K, CL, CO2, GLUCOSE, BUN, CREATININE, CALCIUM, MG, PHOS in the last 168 hours.  CBC: No results for input(s): WBC, NEUTROABS, HGB, HCT, MCV, PLT in the last 168 hours.  Coagulation Studies: No results for input(s): LABPROT, INR in the last 72 hours.  Imaging: No results found.   ASSESSMENT AND PLAN  73 y.o. female with past medical history of hypertension, diabetes, stroke presented to [facility] emergency room with sudden onset left-sided facial weakness and numbness, slurred speech transferred to [facility] after receiving IV TPA.  Patient has clinically improved and is stable.  CT angiogram concerning for right carotid stenosis at the ICA bulb.  Right hemispheric stroke Symptomatic right carotid stenosis  Plan Will  obtain MRI brain We will obtain carotid ultrasound and consider vascular surgery consult Echocardiogram No antiplatelets for 24 hours from TPA 24 hr CT scan to rule out hemorrhage at 3pm  (can be discontinued if MRI brain is performed within 4 hours)  BP goal: permissive HTN upto 180/105 mmHg # HBAIC and Lipid profile # Telemetry monitoring # Frequent neuro checks #  stroke swallow screen   Type 2 diabetes mellitus Sliding scale insulin ordered Held patient's oral hypoglycemics  Hypertension Permissive hypertension up to 180/105 mmHg PRN labetalol  Hyperlipidemia Atorvastatin when patient passes swallow evaluation Lipid profile ordered  Hypokalemia Potassium 2.9 Repeat level pending, will correct if low  Prior CVA Patient on Plavix on hold since patient received IV TPA   DVT prophylaxis: SCDs Full code   This patient is neurologically critically ill due to stroke s/p tPA.  She is at risk for significant risk of neurological worsening from cerebral edema,  death from brain herniation, heart failure, hemorrhagic conversion, infection, respiratory  failure and seizure. This patient's care requires constant monitoring of vital signs, hemodynamics, respiratory and cardiac monitoring, review of multiple databases, neurological assessment, discussion with family, other specialists and medical decision making of high complexity.  I spent 50 minutes of neurocritical time in the care of this patient.    Please page stroke NP  Or  PA  Or MD from 8am -4 pm  as this patient from this time will be  followed by the stroke.   You can look them up on [email or web address]  Password [email or web address]   Bonham Zingale [facility] Pager Number [phone]

## 2018-11-16 ENCOUNTER — Encounter (HOSPITAL_COMMUNITY): Payer: Self-pay

## 2018-11-16 ENCOUNTER — Inpatient Hospital Stay (HOSPITAL_COMMUNITY): Payer: Medicare Other

## 2018-11-16 ENCOUNTER — Other Ambulatory Visit: Payer: Self-pay

## 2018-11-16 DIAGNOSIS — I340 Nonrheumatic mitral (valve) insufficiency: Secondary | ICD-10-CM

## 2018-11-16 LAB — COMPREHENSIVE METABOLIC PANEL
ALT: 12 U/L (ref 0–44)
AST: 9 U/L — ABNORMAL LOW (ref 15–41)
Albumin: 3.1 g/dL — ABNORMAL LOW (ref 3.5–5.0)
Alkaline Phosphatase: 65 U/L (ref 38–126)
Anion gap: 9 (ref 5–15)
BUN: 16 mg/dL (ref 8–23)
CO2: 26 mmol/L (ref 22–32)
Calcium: 8.9 mg/dL (ref 8.9–10.3)
Chloride: 107 mmol/L (ref 98–111)
Creatinine, Ser: 0.83 mg/dL (ref 0.44–1.00)
GFR calc Af Amer: >60 mL/min (ref >60)
GFR calc non Af Amer: >60 mL/min (ref >60)
Glucose, Bld: 155 mg/dL — ABNORMAL HIGH (ref 70–99)
Potassium: 3.8 mmol/L (ref 3.5–5.1)
Sodium: 142 mmol/L (ref 135–145)
Total Bilirubin: 0.7 mg/dL (ref 0.3–1.2)
Total Protein: 6.1 g/dL — ABNORMAL LOW (ref 6.5–8.1)

## 2018-11-16 LAB — LIPID PANEL
Cholesterol: 226 mg/dL — ABNORMAL HIGH (ref 0–200)
HDL: 44 mg/dL (ref >40)
LDL Cholesterol: 157 mg/dL — ABNORMAL HIGH (ref 0–99)
Total CHOL/HDL Ratio: 5.1 RATIO
Triglycerides: 124 mg/dL (ref <150)
VLDL: 25 mg/dL (ref 0–40)

## 2018-11-16 LAB — GLUCOSE, CAPILLARY
Glucose-Capillary: 146 mg/dL — ABNORMAL HIGH (ref 70–99)
Glucose-Capillary: 125 mg/dL — ABNORMAL HIGH (ref 70–99)
Glucose-Capillary: 202 mg/dL — ABNORMAL HIGH (ref 70–99)
Glucose-Capillary: 299 mg/dL — ABNORMAL HIGH (ref 70–99)
Glucose-Capillary: 266 mg/dL — ABNORMAL HIGH (ref 70–99)

## 2018-11-16 LAB — ECHOCARDIOGRAM COMPLETE
Height: 63 in
Weight: 3392 oz

## 2018-11-16 MED ORDER — ATORVASTATIN CALCIUM 80 MG PO TABS
80.0000 mg | ORAL_TABLET | Freq: Every day | ORAL | Status: DC
  Administered 2018-11-16 – 2018-11-17 (×2): 80 mg via ORAL
  Filled 2018-11-16 (×2): qty 1

## 2018-11-16 MED ORDER — ROPINIROLE HCL 1 MG PO TABS: 1.0000 mg | ORAL_TABLET | Freq: Every day | ORAL | Status: DC

## 2018-11-16 MED ORDER — GABAPENTIN 300 MG PO CAPS
300.0000 mg | ORAL_CAPSULE | Freq: Three times a day (TID) | ORAL | Status: DC
  Administered 2018-11-16 – 2018-11-18 (×5): 300 mg via ORAL
  Filled 2018-11-16 (×5): qty 1

## 2018-11-16 MED ORDER — EZETIMIBE 10 MG PO TABS
10.0000 mg | ORAL_TABLET | Freq: Every day | ORAL | Status: DC
  Administered 2018-11-16 – 2018-11-17 (×2): 10 mg via ORAL
  Filled 2018-11-16 (×2): qty 1

## 2018-11-16 MED ORDER — GLIMEPIRIDE 4 MG PO TABS
4.0000 mg | ORAL_TABLET | Freq: Two times a day (BID) | ORAL | Status: DC
  Administered 2018-11-17 – 2018-11-18 (×3): 4 mg via ORAL
  Filled 2018-11-16 (×4): qty 1

## 2018-11-16 MED ORDER — INSULIN ASPART 100 UNIT/ML ~~LOC~~ SOLN
0.0000 [IU] | Freq: Three times a day (TID) | SUBCUTANEOUS | Status: DC
  Administered 2018-11-16: 5 [IU] via SUBCUTANEOUS
  Administered 2018-11-16: 3 [IU] via SUBCUTANEOUS
  Administered 2018-11-16: 1 [IU] via SUBCUTANEOUS
  Administered 2018-11-17 (×2): 5 [IU] via SUBCUTANEOUS
  Administered 2018-11-17: 3 [IU] via SUBCUTANEOUS

## 2018-11-16 MED ORDER — LISINOPRIL 20 MG PO TABS
40.0000 mg | ORAL_TABLET | Freq: Every day | ORAL | Status: DC
  Administered 2018-11-17: 40 mg via ORAL
  Filled 2018-11-16 (×2): qty 2

## 2018-11-16 MED ORDER — PANTOPRAZOLE SODIUM 40 MG PO TBEC: 40.0000 mg | DELAYED_RELEASE_TABLET | Freq: Every day | ORAL | Status: DC

## 2018-11-16 MED ORDER — ROPINIROLE HCL 1 MG PO TABS
1.0000 mg | ORAL_TABLET | Freq: Every day | ORAL | Status: DC
  Administered 2018-11-16 – 2018-11-17 (×2): 1 mg via ORAL
  Filled 2018-11-16 (×2): qty 1

## 2018-11-16 MED ORDER — HYDRALAZINE HCL 25 MG PO TABS: 25.0000 mg | ORAL_TABLET | Freq: Every day | ORAL | Status: DC

## 2018-11-16 MED ORDER — LORAZEPAM 2 MG/ML IJ SOLN
0.5000 mg | Freq: Once | INTRAMUSCULAR | Status: AC
  Administered 2018-11-16: 0.5 mg via INTRAVENOUS
  Filled 2018-11-16: qty 1

## 2018-11-16 NOTE — Progress Notes (Signed)
Echocardiogram 2D Echocardiogram has been performed.  Oneal Deputy Candice Lunney 11/16/2018, 10:07 AM

## 2018-11-16 NOTE — Progress Notes (Signed)
Pt OK to go to MRI w/out RN per Dr. Jaynee Eagles

## 2018-11-16 NOTE — Progress Notes (Signed)
OT Cancellation Note  Patient Details Name: RAYHANA SLIDER MRN: [id number] DOB: 14-Dec-1945   Cancelled Treatment:    Reason Eval/Treat Not Completed: Active bedrest order.  Will reattempt.  Lucille Passy, OTR/L [place] Pager [phone] Office [phone]   Lucille Passy M 11/16/2018, 9:06 AM

## 2018-11-16 NOTE — Progress Notes (Signed)
STROKE TEAM PROGRESS NOTE   HISTORY OF PRESENT ILLNESS (per record) [name] is a 73 y.o. female with past medical history of hypertension, diabetes, stroke presented to [facility] emergency room with sudden onset left-sided facial weakness and numbness, slurred speech that began at 12:30 PM today  She was evaluated by [facility] neurology and received IV TPA after negative CT head.  Patient is on Plavix at home.  Patient was transferred to [facility] for further management after discussion with Dr. Leonel Ramsay.   Patient arrived to the neuro ICU around 10 PM, symptoms had significantly improved.  Patient's blood pressure within 101 systolic.  Work-up at [facility] included CT head: No hemorrhage aspects 10 CT angiogram showed no intracranial stenosis-apart from a P1 stenosis, 70% stenosis of the right ICA bulb and 30% on the left ICA at the bulb.  Has 50% stenosis of the right vertebral artery at the origin. C CBC: Leukocytosis with WBC count of 14.6.  Hemoglobin 14.5 and platelet count 246 INR was 1.0 sodium was 138 potassium was 2.9  Date last known well: 7.10-20 Time last known well: 12:30 PM tPA Given: Yes NIHSS:  Baseline MRS   SUBJECTIVE (INTERVAL HISTORY) Her family is not at bedside. She is sitting up comfortably on the phone talking briskly in NAD. Mri pending. very talkative. This is her 7th stroke. She does not want an MRI, I encouraged her to try and get it.     OBJECTIVE Vitals:   11/16/18 0500 11/16/18 0530 11/16/18 0600 11/16/18 0700  BP: (!) 150/80 (!) 146/72 (!) 124/100 (!) 177/86  Pulse: 66 68 66 64  Resp: 17 18 14 15   Temp:      TempSrc:      SpO2: 97% 98% 98% 99%  Weight:      Height:        CBC: No results for input(s): WBC, NEUTROABS, HGB, HCT, MCV, PLT in the last 168 hours.  Basic Metabolic Panel:  Recent Labs  Lab 11/16/18 0553  NA 142  K 3.8  CL 107  CO2 26  GLUCOSE 155*  BUN 16  CREATININE 0.83  CALCIUM 8.9    Lipid  Panel:     Component Value Date/Time   CHOL 226 (H) 11/16/2018 0315   TRIG 124 11/16/2018 0315   HDL 44 11/16/2018 0315   CHOLHDL 5.1 11/16/2018 0315   VLDL 25 11/16/2018 0315   LDLCALC 157 (H) 11/16/2018 0315   HgbA1c: No results found for: HGBA1C Urine Drug Screen: No results found for: LABOPIA, COCAINSCRNUR, LABBENZ, AMPHETMU, THCU, LABBARB  Alcohol Level No results found for: ETH  IMAGING   No results found.  MR - pending    Transthoracic Echocardiogram  00/00/2020 Pending   EKG - pending   PHYSICAL EXAM Blood pressure (!) 177/86, pulse 64, temperature 98.3 F (36.8 C), temperature source Oral, resp. rate 15, height 5\' 3"  (1.6 m), weight 96.2 kg, SpO2 99 %.   Exam: NAD, pleasant                  Speech:    Speech is normal; fluent and spontaneous with normal comprehension.  Cognition:    The patient is oriented to person, place, and time;     recent and remote memory intact;     language fluent;    Cranial Nerves:    The pupils are equal, round, and reactive to light.Trigeminal sensation is intact and the muscles of mastication are normal. The face is  symmetric. The palate elevates in the midline. Hearing intact. Voice is normal. Shoulder shrug is normal. The tongue has normal motion without fasciculations.   Coordination:  No dysmetria  Motor Observation:    No asymmetry, no atrophy, and no involuntary movements noted. Tone:    Normal muscle tone.     Strength: Mild left extremity weakness 4/5, otherwise strength is V/V in the upper and lower limbs.      Sensation: intact to LT   ASSESSMENT/PLAN [name] is a 73 y.o. female with history of hypertension, Hld, CHF, diabetes, and previous stroke presenting [facility] with sudden onset left-sided facial weakness and numbness, slurred speech. She [facility]  11/15/18 per Tele-Neurology.  Stroke vs TIA: MRI pending  Resultant  Mild left arm weakness  CT head  - OSH - No hemorrhage aspects 10  MRI head - pending, ordered ativan  MRA head - not performed  CTA H&N - OSH - no intracranial stenosis-apart from a P1 stenosis, 70% stenosis of the right ICA bulb and 30% on the left ICA at the bulb. Has 50% stenosis of the right vertebral artery at the origin.  Carotid Doppler - CTA neck performed - carotid dopplers not indicated.  2D Echo - pending  Loyal JacobsonSars Corona Virus 2 - not performed here.  LDL - 157  HgbA1c - pending  UDS - not performed  VTE prophylaxis - SCDs  Diet - Carb modified with thin liquids  clopidogrel 75 mg daily prior to admission, now on No antithrombotic s/p tPA  Patient will be counseled to be compliant with her antithrombotic medications  Ongoing aggressive stroke risk factor management  Therapy recommendations:  pending  Disposition:  Pending  Hypertension  Blood pressure somewhat high at times but within post stroke parameters . Permissive hypertension (OK if < 220/120) but gradually normalize in 5-7 days . Long-term BP goal normotensive  Hyperlipidemia  Lipid lowering medication PTA:  Lipitor 80 mg daily and Zetia 10 mg daily  LDL 157, goal < 70  Current lipid lowering medication: will order Lipitor 80 mg daily and Zetia 10 mg daily  Continue statin at discharge  Diabetes  HgbA1c pending, goal < 7.0  Unc / Controlled  Other Stroke Risk Factors  Advanced age  ETOH use, advised to drink no more than 1 alcoholic beverage per day.  Obesity, Body mass index is 37.55 kg/m., recommend weight loss, diet and exercise as appropriate   Hx stroke/TIA  Family hx stroke (mother)  Other Active Problems  70% stenosis of the right ICA bulb - consider vascular surgery consult after MRI resultes    Hospital day # 1  Personally examined patient and images, and have participated in and made any corrections needed to history, physical, neuro exam,assessment and plan as stated above.  I have personally  obtained the history, evaluated lab date, reviewed imaging studies and agree with radiology interpretations.    Naomie DeanAntonia Sue Mcalexander, MD Stroke Neurology   A total of 25 minutes was spent for the care of this patient, spent on counseling patient and family on different diagnostic and therapeutic options, counseling and coordination of care, riskd ans benefits of management, compliance, or risk factor reduction and education.   To contact Stroke Continuity provider, please refer to [email or web address]. After hours, contact General Neurology

## 2018-11-16 NOTE — Progress Notes (Signed)
PT Cancellation Note  Patient Details Name: [name] MRN: [id number] DOB: 07-Feb-1946   Cancelled Treatment:    Reason Eval/Treat Not Completed: Active bedrest order Will follow.   Marguarite Arbour A Dempsey Knotek 11/16/2018, 7:43 AM  Wray Kearns, PT, DPT Acute Rehabilitation Services Pager [phone] Office [phone]

## 2018-11-16 NOTE — Evaluation (Addendum)
Physical Therapy Evaluation Patient Details Name: [name] Eustice MRN: [id number] DOB: [birth year] Today's Date: 11/16/2018   History of Present Illness  Patient is a 73 y/o female who presents with left sided weakness/numbness and slurred speech. Head CT-unremarkable. CT angiogram- P1 stenosis, 70% stenosis of the right ICA bulb. s/p tPA. Brain MRI pending. PMH includes multiple strokes,HTN, HLD, DM, CHF.  Clinical Impression  Patient presents with decreased sensation LLE/face, impaired balance and impaired mobility s/p above. Pt requires assist with some ADLs at home and uses RW for ambulation. Pt reports needing hip and knee replacement on RLE so has some difficulty getting around at baseline. Today, pt requires Min A-Min guard assist for transfers and ambulation with RW for support. Has support of spouse at home. Pt aware of signs/symptoms of stroke as she reports she has had 7 strokes. Will follow acutely to maximize independence and mobility prior to return home.  MRI pending.     Follow Up Recommendations Home health PT;Supervision for mobility/OOB;Supervision/Assistance - 24 hour    Equipment Recommendations  None recommended by PT    Recommendations for Other Services       Precautions / Restrictions Precautions Precautions: Fall Precaution Comments: needs Rt THA and right TKA in the near future Restrictions Weight Bearing Restrictions: No      Mobility  Bed Mobility Overal bed mobility: Needs Assistance Bed Mobility: Supine to Sit     Supine to sit: Min guard;HOB elevated     General bed mobility comments: no assist needed, increased time and effort.  Transfers Overall transfer level: Needs assistance Equipment used: Rolling walker (2 wheeled) Transfers: Sit to/from Stand Sit to Stand: Min assist         General transfer comment: Assist to power to standing with increased time and use of momentum. Transferred to chair post  ambulation.  Ambulation/Gait Ambulation/Gait assistance: Min guard Gait Distance (Feet): 5 Feet Assistive device: Rolling walker (2 wheeled) Gait Pattern/deviations: Step-to pattern;Decreased step length - left;Decreased stance time - right;Antalgic Gait velocity: decreased   General Gait Details: Slow, unsteady gait with decreased stance time RLE and decreased step length LLE. Very slow to move.  Stairs            Wheelchair Mobility    Modified Rankin (Stroke Patients Only) Modified Rankin (Stroke Patients Only) Pre-Morbid Rankin Score: Moderately severe disability Modified Rankin: Moderately severe disability     Balance Overall balance assessment: Needs assistance Sitting-balance support: Feet supported;No upper extremity supported Sitting balance-Leahy Scale: Fair     Standing balance support: During functional activity Standing balance-Leahy Scale: Poor Standing balance comment: Requires BUE support in standing.                             Pertinent Vitals/Pain Pain Assessment: No/denies pain(stiffness of joints)    Home Living Family/patient expects to be discharged to:: Private residence Living Arrangements: Spouse/significant other Available Help at Discharge: Family;Available 24 hours/day Type of Home: House Home Access: Stairs to enter Entrance Stairs-Rails: Right Entrance Stairs-Number of Steps: 3 in front; 1 in from the car port; 1 on the patio Home Layout: One level Home Equipment: Walker - 2 wheels;Walker - 4 wheels;Shower seat      Prior Function Level of Independence: Needs assistance   Gait / Transfers Assistance Needed: Uses RW for ambulation.  ADL's / Homemaking Assistance Needed: Does bird baths for the last year; needs help with LB dressing. [facility] Sister helps with cooking/cleaning.  Comments: Uses RW for ambulation. Sister helps with cooking/cleaning. Needs help with donning socks. Has been getting  [facility] services. Has been doing bird baths.     Hand Dominance   Dominant Hand: Right    Extremity/Trunk Assessment   Upper Extremity Assessment Upper Extremity Assessment: Defer to OT evaluation    Lower Extremity Assessment Lower Extremity Assessment: (Grossly ~4/5 throughout with pain with resistance on RLE due to need for joint replacements) RLE Sensation: (premorbid decreased sensation from neuropathy) LLE Sensation: (new decreased sensation from stroke)    Cervical / Trunk Assessment Cervical / Trunk Assessment: Kyphotic  Communication   Communication: No difficulties  Cognition Arousal/Alertness: Awake/alert Behavior During Therapy: WFL for tasks assessed/performed Overall Cognitive Status: Within Functional Limits for tasks assessed                                        General Comments General comments (skin integrity, edema, etc.): VSS throughout.    Exercises     Assessment/Plan    PT Assessment Patient needs continued PT services  PT Problem List Decreased strength;Decreased mobility;Decreased balance;Impaired sensation       PT Treatment Interventions Therapeutic activities;Gait training;Therapeutic exercise;Patient/family education;Balance training;Functional mobility training    PT Goals (Current goals can be found in the Care Plan section)  Acute Rehab PT Goals Patient Stated Goal: to be able to get my joint replacements done so I can walk PT Goal Formulation: With patient Time For Goal Achievement: 11/30/18 Potential to Achieve Goals: Good    Frequency Min 4X/week   Barriers to discharge        Co-evaluation               AM-PAC PT "6 Clicks" Mobility  Outcome Measure Help needed turning from your back to your side while in a flat bed without using bedrails?: A Little Help needed moving from lying on your back to sitting on the side of a flat bed without using bedrails?: A Little Help needed moving to and from a bed to  a chair (including a wheelchair)?: A Little Help needed standing up from a chair using your arms (e.g., wheelchair or bedside chair)?: A Little Help needed to walk in hospital room?: A Little Help needed climbing 3-5 steps with a railing? : A Lot 6 Click Score: 17    End of Session Equipment Utilized During Treatment: Gait belt Activity Tolerance: Patient tolerated treatment well Patient left: in chair;with call bell/phone within reach Nurse Communication: Mobility status PT Visit Diagnosis: Unsteadiness on feet (R26.81);Difficulty in walking, not elsewhere classified (R26.2)    Time: 9604-5409 PT Time Calculation (min) (ACUTE ONLY): 26 min   Charges:   PT Evaluation $PT Eval Moderate Complexity: 1 Mod PT Treatments $Therapeutic Activity: 8-22 mins        Wray Kearns, PT, DPT Acute Rehabilitation Services Pager [phone] Office [phone]      Marguarite Arbour A Sabra Heck 11/16/2018, 12:16 PM

## 2018-11-17 ENCOUNTER — Inpatient Hospital Stay (HOSPITAL_COMMUNITY): Payer: Medicare Other

## 2018-11-17 DIAGNOSIS — Z794 Long term (current) use of insulin: Secondary | ICD-10-CM

## 2018-11-17 DIAGNOSIS — E876 Hypokalemia: Secondary | ICD-10-CM

## 2018-11-17 DIAGNOSIS — I63231 Cerebral infarction due to unspecified occlusion or stenosis of right carotid arteries: Secondary | ICD-10-CM

## 2018-11-17 DIAGNOSIS — I6521 Occlusion and stenosis of right carotid artery: Secondary | ICD-10-CM

## 2018-11-17 DIAGNOSIS — R739 Hyperglycemia, unspecified: Secondary | ICD-10-CM

## 2018-11-17 DIAGNOSIS — E785 Hyperlipidemia, unspecified: Secondary | ICD-10-CM

## 2018-11-17 DIAGNOSIS — I10 Essential (primary) hypertension: Secondary | ICD-10-CM

## 2018-11-17 DIAGNOSIS — E1159 Type 2 diabetes mellitus with other circulatory complications: Secondary | ICD-10-CM

## 2018-11-17 LAB — BASIC METABOLIC PANEL
Anion gap: 8 (ref 5–15)
BUN: 14 mg/dL (ref 8–23)
CO2: 23 mmol/L (ref 22–32)
Calcium: 8.8 mg/dL — ABNORMAL LOW (ref 8.9–10.3)
Chloride: 109 mmol/L (ref 98–111)
Creatinine, Ser: 0.72 mg/dL (ref 0.44–1.00)
GFR calc Af Amer: >60 mL/min (ref >60)
GFR calc non Af Amer: >60 mL/min (ref >60)
Glucose, Bld: 269 mg/dL — ABNORMAL HIGH (ref 70–99)
Potassium: 3.2 mmol/L — ABNORMAL LOW (ref 3.5–5.1)
Sodium: 140 mmol/L (ref 135–145)

## 2018-11-17 LAB — GLUCOSE, CAPILLARY
Glucose-Capillary: 234 mg/dL — ABNORMAL HIGH (ref 70–99)
Glucose-Capillary: 266 mg/dL — ABNORMAL HIGH (ref 70–99)
Glucose-Capillary: 268 mg/dL — ABNORMAL HIGH (ref 70–99)
Glucose-Capillary: 264 mg/dL — ABNORMAL HIGH (ref 70–99)

## 2018-11-17 LAB — CBC
HCT: 37.3 % (ref 36.0–46.0)
Hemoglobin: 12.5 g/dL (ref 12.0–15.0)
MCH: 29.2 pg (ref 26.0–34.0)
MCHC: 33.5 g/dL (ref 30.0–36.0)
MCV: 87.1 fL (ref 80.0–100.0)
Platelets: 185 10*3/uL (ref 150–400)
RBC: 4.28 MIL/uL (ref 3.87–5.11)
RDW: 13.5 % (ref 11.5–15.5)
WBC: 9.6 10*3/uL (ref 4.0–10.5)
nRBC: 0 % (ref 0.0–0.2)

## 2018-11-17 MED ORDER — POTASSIUM CHLORIDE CRYS ER 20 MEQ PO TBCR
20.0000 meq | EXTENDED_RELEASE_TABLET | Freq: Two times a day (BID) | ORAL | Status: DC
  Administered 2018-11-17 – 2018-11-18 (×3): 20 meq via ORAL
  Filled 2018-11-17 (×3): qty 1

## 2018-11-17 MED ORDER — ASPIRIN EC 81 MG PO TBEC
81.0000 mg | DELAYED_RELEASE_TABLET | Freq: Every day | ORAL | Status: DC
  Administered 2018-11-17 – 2018-11-18 (×2): 81 mg via ORAL
  Filled 2018-11-17 (×2): qty 1

## 2018-11-17 MED ORDER — INSULIN ASPART 100 UNIT/ML ~~LOC~~ SOLN
0.0000 [IU] | Freq: Three times a day (TID) | SUBCUTANEOUS | Status: DC
  Administered 2018-11-18 (×2): 7 [IU] via SUBCUTANEOUS

## 2018-11-17 MED ORDER — CLOPIDOGREL BISULFATE 75 MG PO TABS
75.0000 mg | ORAL_TABLET | Freq: Every day | ORAL | Status: DC
  Administered 2018-11-17 – 2018-11-18 (×2): 75 mg via ORAL
  Filled 2018-11-17 (×2): qty 1

## 2018-11-17 MED ORDER — INSULIN ASPART 100 UNIT/ML ~~LOC~~ SOLN
0.0000 [IU] | Freq: Every day | SUBCUTANEOUS | Status: DC
  Administered 2018-11-17: 3 [IU] via SUBCUTANEOUS

## 2018-11-17 NOTE — Evaluation (Signed)
Occupational Therapy Evaluation Patient Details Name: [name] MRN: [id number] DOB: 1945-06-03 Today's Date: 11/17/2018    History of Present Illness Patient is a 73 y/o female who presents with left sided weakness/numbness and slurred speech. Head CT-unremarkable. CT angiogram- P1 stenosis, 70% stenosis of the right ICA bulb. s/p tPA. Brain MRI showed small Rt basal ganglia infarct. PMH includes multiple strokes,HTN, HLD, DM, CHF.   Clinical Impression   Pt admitted with above. She demonstrates the below listed deficits and will benefit from continued OT to maximize safety and independence with BADLs.  Pt presents to OT with pain Rt hip that is chronic and limits her function; mild Lt sided weakness, impaired balance, and it appears that her visual fields are diminished bil.  She currently requries min guard to mod A for ADLs, and min guard assist for functional transfers.  She prefers to discharge home with family support.  Recommend that she does not drive and have a full visual field assessment by ophthalmology (Pt agrees with recs), as well as HHOT, and HHaide.  Will follow.        Follow Up Recommendations  Home health OT;Supervision/Assistance - 24 hour;Other (comment)(HHaide )    Equipment Recommendations  None recommended by OT    Recommendations for Other Services       Precautions / Restrictions Precautions Precautions: Fall Precaution Comments: needs Rt THA and right TKA in the near future      Mobility Bed Mobility               General bed mobility comments: sitting in recliner   Transfers Overall transfer level: Needs assistance Equipment used: Rolling walker (2 wheeled) Transfers: Sit to/from UGI CorporationStand;Stand Pivot Transfers Sit to Stand: Min guard Stand pivot transfers: Min guard       General transfer comment: increased time and effort     Balance Overall balance assessment: Needs assistance Sitting-balance support: Feet supported;No upper  extremity supported Sitting balance-Leahy Scale: Fair     Standing balance support: During functional activity Standing balance-Leahy Scale: Poor Standing balance comment: reliant on UE support                            ADL either performed or assessed with clinical judgement   ADL Overall ADL's : Needs assistance/impaired Eating/Feeding: Modified independent   Grooming: Wash/dry face;Wash/dry hands;Oral care;Brushing hair;Min guard;Standing   Upper Body Bathing: Set up;Sitting   Lower Body Bathing: Minimal assistance;Sit to/from stand   Upper Body Dressing : Minimal assistance;Sitting   Lower Body Dressing: Maximal assistance;Sit to/from stand Lower Body Dressing Details (indicate cue type and reason): Pt typically uses AE  Toilet Transfer: Min guard;Ambulation;Comfort height toilet;BSC;Grab bars;RW   Toileting- Clothing Manipulation and Hygiene: Minimal assistance;Sit to/from stand       Functional mobility during ADLs: Min guard;Rolling walker General ADL Comments: Pt is dependent upon use of AE for LB ADLs      Vision Baseline Vision/History: Wears glasses Wears Glasses: At all times Patient Visual Report: Peripheral vision impairment;Diplopia Vision Assessment?: Yes Eye Alignment: Within Functional Limits Ocular Range of Motion: Within Functional Limits Alignment/Gaze Preference: Within Defined Limits Tracking/Visual Pursuits: Able to track stimulus in all quads without difficulty Visual Fields: Left visual field deficit;Right visual field deficit Additional Comments: Pt appears to have diminished fields both Lt and Rt      Perception Perception Perception Tested?: Yes   Praxis Praxis Praxis tested?: Within functional limits  Pertinent Vitals/Pain Pain Assessment: Faces Faces Pain Scale: Hurts even more Pain Location: Rt hip  Pain Descriptors / Indicators: Aching Pain Intervention(s): Monitored during session     Hand Dominance Right    Extremity/Trunk Assessment Upper Extremity Assessment Upper Extremity Assessment: LUE deficits/detail LUE Deficits / Details: grossly 3+/5-4-/5 LUE Sensation: decreased light touch;history of peripheral neuropathy LUE Coordination: decreased fine motor       Cervical / Trunk Assessment Cervical / Trunk Assessment: Kyphotic   Communication Communication Communication: No difficulties   Cognition Arousal/Alertness: Awake/alert Behavior During Therapy: WFL for tasks assessed/performed Overall Cognitive Status: Within Functional Limits for tasks assessed                                 General Comments: WFL for basic tasks    General Comments       Exercises     Shoulder Instructions      Home Living Family/patient expects to be discharged to:: Private residence Living Arrangements: Spouse/significant other Available Help at Discharge: Family;Available 24 hours/day Type of Home: House Home Access: Stairs to enter Entergy CorporationEntrance Stairs-Number of Steps: 3 in front; 1 in from the car port; 1 on the patio Entrance Stairs-Rails: Right Home Layout: One level     Bathroom Shower/Tub: Chief Strategy OfficerTub/shower unit   Bathroom Toilet: Handicapped height Bathroom Accessibility: No   Home Equipment: Environmental consultantWalker - 2 wheels;Walker - 4 wheels;Shower seat;Adaptive equipment Adaptive Equipment: Reacher;Other (Comment)(dressing stick ) Additional Comments: bathroom is not accesibe to walker.  She reports she has a grab bar installed in her bathroom. She walks to BR, parks her RW, then holds to the bar to walk into the BR      Prior Functioning/Environment Level of Independence: Needs assistance  Gait / Transfers Assistance Needed: Uses RW for ambulation. ADL's / Homemaking Assistance Needed: Does bird baths for the last year; needs occasional help with LB dressing. Has been getting HH services. Sister helps with cooking/cleaning.   Comments: Pt drives short distances         OT Problem  List: Decreased strength;Decreased activity tolerance;Impaired balance (sitting and/or standing);Impaired vision/perception;Decreased coordination;Decreased cognition;Decreased knowledge of use of DME or AE;Impaired sensation;Impaired UE functional use;Pain      OT Treatment/Interventions: Self-care/ADL training;Neuromuscular education;DME and/or AE instruction;Therapeutic activities;Cognitive remediation/compensation;Visual/perceptual remediation/compensation;Patient/family education;Balance training    OT Goals(Current goals can be found in the care plan section) Acute Rehab OT Goals Patient Stated Goal: I'd like to get all of these surgeries done so I can get back to normal  OT Goal Formulation: With patient Time For Goal Achievement: 12/01/18 Potential to Achieve Goals: Good ADL Goals Pt Will Perform Grooming: with supervision;standing Pt Will Transfer to Toilet: with supervision;ambulating;regular height toilet;bedside commode;grab bars Pt Will Perform Toileting - Clothing Manipulation and hygiene: with supervision;sit to/from stand  OT Frequency: Min 2X/week   Barriers to D/C:    Pt feels like spouse and sisters will be able to provide enough assist        Co-evaluation              AM-PAC OT "6 Clicks" Daily Activity     Outcome Measure Help from another person eating meals?: None Help from another person taking care of personal grooming?: A Little Help from another person toileting, which includes using toliet, bedpan, or urinal?: A Little Help from another person bathing (including washing, rinsing, drying)?: A Lot Help from another person to put on and taking  off regular upper body clothing?: A Little Help from another person to put on and taking off regular lower body clothing?: A Lot 6 Click Score: 17   End of Session Equipment Utilized During Treatment: Gait belt;Rolling walker Nurse Communication: Mobility status  Activity Tolerance: Patient tolerated treatment  well Patient left: in chair;with call bell/phone within reach;with chair alarm set  OT Visit Diagnosis: Hemiplegia and hemiparesis;Pain;Unsteadiness on feet (R26.81) Hemiplegia - Right/Left: Left Hemiplegia - dominant/non-dominant: Non-Dominant Hemiplegia - caused by: Cerebral infarction Pain - Right/Left: Right Pain - part of body: Hip                Time: 9357-0177 OT Time Calculation (min): 28 min Charges:  OT General Charges $OT Visit: 1 Visit OT Evaluation $OT Eval Moderate Complexity: 1 Mod OT Treatments $Self Care/Home Management : 8-22 mins  Lucille Passy, OTR/L Acute Rehabilitation Services Pager [phone] Office [phone]   Lucille Passy M 11/17/2018, 4:46 PM

## 2018-11-17 NOTE — Progress Notes (Signed)
STROKE TEAM PROGRESS NOTE   SUBJECTIVE (INTERVAL HISTORY) Her family is not at bedside. She is sitting in chair, not in distress, PT/OT recommend HH. Pt CT repeat showed no frank hematoma. Will start DAPT and will consult VVS in am.   OBJECTIVE Vitals:   11/17/18 0015 11/17/18 0343 11/17/18 0803 11/17/18 1241  BP: (!) 163/70 (!) 149/64 (!) 171/96 (!) 157/73  Pulse: 79 76 73 75  Resp: 16 15 20 20   Temp: 98.3 F (36.8 C) 98.1 F (36.7 C) 97.6 F (36.4 C) 97.9 F (36.6 C)  TempSrc: Oral Oral Oral Oral  SpO2: 96% 100% 100% 100%  Weight:      Height:        CBC:  Recent Labs  Lab 11/17/18 0451  WBC 9.6  HGB 12.5  HCT 37.3  MCV 87.1  PLT 185    Basic Metabolic Panel:  Recent Labs  Lab 11/16/18 0553 11/17/18 0451  NA 142 140  K 3.8 3.2*  CL 107 109  CO2 26 23  GLUCOSE 155* 269*  BUN 16 14  CREATININE 0.83 0.72  CALCIUM 8.9 8.8*    Lipid Panel:     Component Value Date/Time   CHOL 226 (H) 11/16/2018 0315   TRIG 124 11/16/2018 0315   HDL 44 11/16/2018 0315   CHOLHDL 5.1 11/16/2018 0315   VLDL 25 11/16/2018 0315   LDLCALC 157 (H) 11/16/2018 0315   HgbA1c: No results found for: HGBA1C Urine Drug Screen: No results found for: LABOPIA, COCAINSCRNUR, LABBENZ, AMPHETMU, THCU, LABBARB  Alcohol Level No results found for: ETH  IMAGING  Ct Head Wo Contrast 11/17/2018 IMPRESSION:  1. Continued normal interval evolution of acute right basal ganglia infarct, stable in size morphology from previous. Associated faint petechial hemorrhage without evidence for hemorrhagic transformation, stable.  2. No other new acute intracranial abnormality.    Mr Brain Wo Contrast 11/16/2018 IMPRESSION:  1. Small infarcts of the right basal ganglia corpus striatum with petechial hemorrhage, Heidelberg classification 1b: HI2, confluent petechiae, no mass effect.  2. Chronic small vessel ischemia and generalized volume loss.    Transthoracic Echocardiogram   11/16/2018 IMPRESSIONS  1. The left ventricle has normal systolic function with an ejection fraction of 60-65%. The cavity size was normal. Left ventricular diastolic Doppler parameters are consistent with impaired relaxation. Elevated mean left atrial pressure.  2. The right ventricle has normal systolic function. The cavity was normal. There is no increase in right ventricular wall thickness.  3. The mitral valve is degenerative. Mild thickening of the mitral valve leaflet. Mild calcification of the mitral valve leaflet. There is severe mitral annular calcification present. The MR jet is posteriorly-directed.  4. The aortic valve is tricuspid. Mild thickening of the aortic valve. Mild calcification of the aortic valve.  5. No intracardiac thrombi or masses were visualized.   PHYSICAL EXAM Blood pressure (!) 157/73, pulse 75, temperature 97.9 F (36.6 C), temperature source Oral, resp. rate 20, height 5\' 3"  (1.6 m), weight 96.2 kg, SpO2 100 %.  Exam: NAD, pleasant                  Speech:    Speech is normal; fluent and spontaneous with normal comprehension.  Cognition:    The patient is oriented to person, place, and time;     recent and remote memory intact;     language fluent;    Cranial Nerves:    The pupils are equal, round, and reactive to light.Trigeminal sensation is  intact and the muscles of mastication are normal. The face is symmetric. The palate elevates in the midline. Hearing intact. Voice is normal. Shoulder shrug is normal. The tongue has normal motion without fasciculations.   Coordination:  No dysmetria  Motor Observation:    No asymmetry, no atrophy, and no involuntary movements noted. Tone:    Normal muscle tone.     Strength: Mild left extremity weakness 4/5, otherwise strength is V/V in the upper and lower limbs.      Sensation: intact to LT   ASSESSMENT/PLAN Ms. JONI COLEGROVE is a 73 y.o. female with history of hypertension, Hld, CHF, diabetes, and  previous stroke presenting to [facility] with sudden onset left-sided facial weakness and numbness, slurred speech. She received IV tPA at [facility]  11/15/18 per Tele-Neurology.  Stroke: right BG/CR small infarct with petechial HT, likely due to right ICA high grade stenosis  CT head - OSH - No hemorrhage aspects 10  CT Repeat 11/17/2018 - Continued normal interval evolution of acute right basal ganglia infarct, faint petechial hemorrhage without evidence for hemorrhagic transformation.   MRI head 11/16/2018 - Small infarcts of the right basal ganglia corpus striatum with petechial hemorrhage   CTA H&N - OSH - no intracranial stenosis-apart from a P1 stenosis, 70% stenosis of the right ICA bulb. Has 50% stenosis of the right VA at the origin.  2D Echo  - EF 60 - 65%. No cardiac source of emboli identified.   [employer] Virus 2 - not performed here.  LDL - 157  HgbA1c - pending  VTE prophylaxis - SCDs  Diet - Carb modified with thin liquids  clopidogrel 75 mg daily prior to admission, now on DAPT for 3 weeks and then plavix alone  Patient will be counseled to be compliant with her antithrombotic medications  Ongoing aggressive stroke risk factor management  Therapy recommendations:  HH PT recommended  Disposition:  Pending  Carotid stenosis  Right ICA bulb 70% stenosis  Likely the cause of current stroke  Consult VVS in am for right CEA  Hypertension  Blood pressure somewhat high at times but within post stroke parameters . Permissive hypertension (OK if < 220/120) but gradually normalize in 3-5 days . Avoid low BP give right ICA stenosis . Long-term BP goal normotensive  Hyperlipidemia  Lipid lowering medication PTA:  Lipitor 80 mg daily and Zetia 10 mg daily  LDL 157, goal < 70  Now on Lipitor 80 mg daily and Zetia 10 mg daily  Continue statin at discharge  Diabetes  HgbA1c pending, goal < 7.0  Uncontrolled  Hyperglycemia  DM  coordinator consult placed  SSI   CBG monitoring  Close PCP follow up  Other Stroke Risk Factors  Advanced age  ETOH use, advised to drink no more than 1 alcoholic beverage per day.  Obesity, Body mass index is 37.55 kg/m., recommend weight loss, diet and exercise as appropriate   Hx stroke/TIA  Family hx stroke (mother)  Other Active Problems  Hypokalemia 3.2 - supplement - check in AM   Hospital day # 2  Rosalin Hawking, MD PhD Stroke Neurology 11/17/2018 9:12 PM    To contact Stroke Continuity provider, please refer to [email or web address] After hours, contact General Neurology

## 2018-11-18 DIAGNOSIS — I6521 Occlusion and stenosis of right carotid artery: Secondary | ICD-10-CM

## 2018-11-18 DIAGNOSIS — E1165 Type 2 diabetes mellitus with hyperglycemia: Secondary | ICD-10-CM | POA: Diagnosis present

## 2018-11-18 DIAGNOSIS — IMO0002 Reserved for concepts with insufficient information to code with codable children: Secondary | ICD-10-CM | POA: Diagnosis present

## 2018-11-18 DIAGNOSIS — E1142 Type 2 diabetes mellitus with diabetic polyneuropathy: Secondary | ICD-10-CM

## 2018-11-18 DIAGNOSIS — Z823 Family history of stroke: Secondary | ICD-10-CM

## 2018-11-18 DIAGNOSIS — E119 Type 2 diabetes mellitus without complications: Secondary | ICD-10-CM

## 2018-11-18 DIAGNOSIS — E11649 Type 2 diabetes mellitus with hypoglycemia without coma: Secondary | ICD-10-CM | POA: Diagnosis present

## 2018-11-18 DIAGNOSIS — I509 Heart failure, unspecified: Secondary | ICD-10-CM

## 2018-11-18 HISTORY — DX: Reserved for concepts with insufficient information to code with codable children: IMO0002

## 2018-11-18 HISTORY — DX: Occlusion and stenosis of right carotid artery: I65.21

## 2018-11-18 HISTORY — DX: Family history of stroke: Z82.3

## 2018-11-18 HISTORY — DX: Type 2 diabetes mellitus with hyperglycemia: E11.65

## 2018-11-18 LAB — CBC
HCT: 35.7 % — ABNORMAL LOW (ref 36.0–46.0)
Hemoglobin: 11.9 g/dL — ABNORMAL LOW (ref 12.0–15.0)
MCH: 29.5 pg (ref 26.0–34.0)
MCHC: 33.3 g/dL (ref 30.0–36.0)
MCV: 88.6 fL (ref 80.0–100.0)
Platelets: 178 10*3/uL (ref 150–400)
RBC: 4.03 MIL/uL (ref 3.87–5.11)
RDW: 13.5 % (ref 11.5–15.5)
WBC: 9 10*3/uL (ref 4.0–10.5)
nRBC: 0 % (ref 0.0–0.2)

## 2018-11-18 LAB — BASIC METABOLIC PANEL
Anion gap: 6 (ref 5–15)
BUN: 12 mg/dL (ref 8–23)
CO2: 25 mmol/L (ref 22–32)
Calcium: 8.5 mg/dL — ABNORMAL LOW (ref 8.9–10.3)
Chloride: 110 mmol/L (ref 98–111)
Creatinine, Ser: 0.69 mg/dL (ref 0.44–1.00)
GFR calc Af Amer: >60 mL/min (ref >60)
GFR calc non Af Amer: >60 mL/min (ref >60)
Glucose, Bld: 238 mg/dL — ABNORMAL HIGH (ref 70–99)
Potassium: 3.7 mmol/L (ref 3.5–5.1)
Sodium: 141 mmol/L (ref 135–145)

## 2018-11-18 LAB — GLUCOSE, CAPILLARY
Glucose-Capillary: 226 mg/dL — ABNORMAL HIGH (ref 70–99)
Glucose-Capillary: 205 mg/dL — ABNORMAL HIGH (ref 70–99)

## 2018-11-18 LAB — HEMOGLOBIN A1C
Hgb A1c MFr Bld: 10.4 % — ABNORMAL HIGH (ref 4.8–5.6)
Mean Plasma Glucose: 252 mg/dL

## 2018-11-18 MED ORDER — GLIMEPIRIDE 4 MG PO TABS: 4.0000 mg | ORAL_TABLET | Freq: Two times a day (BID) | ORAL | 2 refills | Status: DC

## 2018-11-18 MED ORDER — LISINOPRIL 40 MG PO TABS: 20.0000 mg | ORAL_TABLET | Freq: Every day | ORAL | Status: DC

## 2018-11-18 MED ORDER — PANTOPRAZOLE SODIUM 40 MG PO TBEC
40.0000 mg | DELAYED_RELEASE_TABLET | Freq: Every day | ORAL | Status: DC
  Administered 2018-11-18: 40 mg via ORAL
  Filled 2018-11-18: qty 1

## 2018-11-18 MED ORDER — LISINOPRIL 20 MG PO TABS
20.0000 mg | ORAL_TABLET | Freq: Every day | ORAL | Status: DC
  Administered 2018-11-18: 20 mg via ORAL

## 2018-11-18 MED ORDER — CLOTRIMAZOLE 1 % VA CREA
1.0000 | TOPICAL_CREAM | Freq: Every day | VAGINAL | Status: DC
  Filled 2018-11-18: qty 45

## 2018-11-18 MED ORDER — ASPIRIN 81 MG PO TBEC: 81.0000 mg | DELAYED_RELEASE_TABLET | Freq: Every day | ORAL | 0 refills | Status: AC

## 2018-11-18 MED ORDER — CLOTRIMAZOLE 1 % VA CREA: 1.0000 | TOPICAL_CREAM | Freq: Every day | VAGINAL | 0 refills | Status: DC

## 2018-11-18 NOTE — TOC Transition Note (Signed)
Transition of Care [facility]) - CM/SW Discharge Note   Patient Details  Name: PRUDIE GUTHRIDGE MRN: [id number] Date of Birth: 1946/02/05  Transition of Care [facility]) CM/SW Contact:  Pollie Friar, RN Phone Number: 11/18/2018, 3:58 PM   Clinical Narrative:    Pt discharging home with [facility] services. Pt was active with [facility] prior to admission. Resumption orders faxed to [facility]. Pt has transportation home.    Final next level of care: Home w Home Health Services Barriers to Discharge: No Barriers Identified   Patient Goals and CMS Choice   CMS Medicare.gov Compare Post Acute Care list provided to:: Patient Choice offered to / list presented to : Patient  Discharge Placement                       Discharge Plan and Services   Discharge Planning Services: CM Consult Post Acute Care Choice: Home Health, Durable Medical Equipment          DME Arranged: 3-N-1 DME Agency: [employer] Date DME Agency Contacted: 11/18/18   Representative spoke with at DME Agency: [place]: OT, PT, Speech Therapy, RN [facility] Agency: [place] [facility] [place] Date [place]: 11/18/18   Representative spoke with at [place]: [place] Interventions     Readmission Risk Interventions No flowsheet data found.

## 2018-11-18 NOTE — Evaluation (Signed)
Speech Language Pathology Evaluation Patient Details Name: [name] MRN: [id number] DOB: [birth year] Today's Date: 11/18/2018 Time: 1660-6301 SLP Time Calculation (min) (ACUTE ONLY): 25 min  Problem List:  Patient Active Problem List   Diagnosis Date Noted  . Ischemic stroke ([place]) 11/15/2018  . Hyperlipidemia 01/14/2018  . COPD (chronic obstructive pulmonary disease) ([place]) 01/14/2018  . Angina pectoris ([place]) 07/28/2016  . Chronic diastolic congestive heart failure ([place]) 07/28/2016  . Hypertensive heart disease with heart failure ([place]) 07/28/2016  . Diabetic polyneuropathy associated with type 2 diabetes mellitus ([place]) 11/30/2015   Past Medical History:  Past Medical History:  Diagnosis Date  . CHF (congestive heart failure) ([place])   . Diabetes mellitus without complication ([place])   . Hyperlipidemia   . Hypertension   . Stroke [facility])    Past Surgical History:  Past Surgical History:  Procedure Laterality Date  . ABDOMINAL HYSTERECTOMY    . APPENDECTOMY    . CARDIAC CATHETERIZATION     HPI:  73 yo female adm to [facility] with dysarthria and left sided weakness.  Pt found to have right basal ganglia cva per MRI study.  CT head 7/12 showed continued evolution of right basal ganglia cva otherwise negative.  PMH + for multiple strokes, DM, HTN, HLD, CHF.  Pt required Rehab stay at [facility] approximately 10 years ago after a stroke.   Assessment / Plan / Recommendation Clinical Impression  MOCA 7.3 administered to pt *all but visuospatial skills requiring writing as pt did not have reading glasses present.  Pt scored 13/25 indicative of cognitive deficits.  Areas of strengths included orientation and awareness as she readily admits to changes in her focus at this time.  Pt with intact speech and expressive language abilities as she is fluent and verbose.  Difficulty with sentence repetition noted resulting in pt paraphrasing, likely due to h/o expressive language deficits.  Pt required  cues to recall 5/5 words indicative of retrieval deficit but adequate storage.  She states she manages her own medications and her spouse, sister helps with chores, cooking, etc.  Spouse manages bills per pt.  She will benefit from follow up SLP to address areas of cognition that are more challenging currently to maximize her rehabiliation.    SLP Assessment  SLP Recommendation/Assessment: Patient needs continued Speech Lanaguage Pathology Services SLP Visit Diagnosis: Cognitive communication deficit (R41.841)    Follow Up Recommendations  None    Frequency and Duration min 1 x/week  1 week      SLP Evaluation Cognition  Overall Cognitive Status: History of cognitive impairments - at baseline(pt admits her attention is worse than normal) Arousal/Alertness: Awake/alert Orientation Level: Oriented to person;Oriented to place;Oriented to situation;Oriented to time(not oriented to city initially) Attention: Sustained Sustained Attention: Appears intact Memory: Impaired Memory Impairment: Retrieval deficit Awareness: Appears intact Problem Solving: Impaired Problem Solving Impairment: Verbal complex Behaviors: Other (comment)(pt is verbose)       Comprehension  Auditory Comprehension Overall Auditory Comprehension: Appears within functional limits for tasks assessed Yes/No Questions: Not tested Commands: Within Functional Limits Conversation: Complex Visual Recognition/Discrimination Discrimination: Within Function Limits Reading Comprehension Reading Status: Not tested(pt left reading glasses at home)    Expression Expression Primary Mode of Expression: Verbal Verbal Expression Overall Verbal Expression: Appears within functional limits for tasks assessed Initiation: No impairment Repetition: Impaired Level of Impairment: Sentence level Naming: No impairment Pragmatics: No impairment Interfering Components: Attention Written Expression Dominant Hand: Right Written  Expression: Not tested   Oral / Motor  Oral Motor/Sensory Function Overall Oral Motor/Sensory Function: Within functional limits Motor Speech Overall Motor Speech: Appears within functional limits for tasks assessed Respiration: Within functional limits Resonance: Within functional limits Articulation: Within functional limitis Intelligibility: Intelligible Motor Planning: Witnin functional limits Motor Speech Errors: Not applicable   GO                    Chales AbrahamsKimball, Prakriti Carignan Ann 11/18/2018, 10:23 AM  Donavan Burnetamara Jaylei Fuerte, MS [facility] SLP Acute Rehab Services Pager [phone] Office [phone]

## 2018-11-18 NOTE — Care Management Important Message (Signed)
Important Message  Patient Details  Name: [name] MRN: [id number] Date of Birth: [birth year]   Medicare Important Message Given:  Yes     Orbie Pyo 11/18/2018, 4:12 PM

## 2018-11-18 NOTE — Progress Notes (Signed)
Physical Therapy Treatment Patient Details Name: [name] MRN: [id number] DOB: 10/10/1945 Today's Date: 11/18/2018    History of Present Illness Patient is a 73 y/o female who presents with left sided weakness/numbness and slurred speech. Head CT-unremarkable. CT angiogram- P1 stenosis, 70% stenosis of the right ICA bulb. s/p tPA. Brain MRI showed small Rt basal ganglia infarct. PMH includes multiple strokes,HTN, HLD, DM, CHF.    PT Comments    Noted pt for d/c home today. Strongly recommend pt have 24 hour support to decrease risk for falls. Overall tolerance for functional activity is low at this time 2 hip/knee pain. Gross antalgia with ambulation is likely baseline but not without safety concerns. Will continue to follow and progress as able per POC.     Follow Up Recommendations  Home health PT;Supervision for mobility/OOB;Supervision/Assistance - 24 hour     Equipment Recommendations  None recommended by PT    Recommendations for Other Services       Precautions / Restrictions Precautions Precautions: Fall Precaution Comments: needs Rt THA and right TKA in the near future Restrictions Weight Bearing Restrictions: No    Mobility  Bed Mobility Overal bed mobility: Needs Assistance Bed Mobility: Rolling;Sidelying to Sit Rolling: Min assist Sidelying to sit: Min assist       General bed mobility comments: Assist to fully roll onto her side. Pt did a 1/2 roll and attempting to sit up from there, calling out "I'm stuck!". Assist for transition to full sitting position at EOB.   Transfers Overall transfer level: Needs assistance Equipment used: Rolling walker (2 wheeled) Transfers: Sit to/from Stand Sit to Stand: Min assist;From elevated surface         General transfer comment: Pt unable to achieve full stand from EOB in low position. Required elevated surface and min assist for power-up. In bathroom, pt with heacvy use of railing for support.    Ambulation/Gait Ambulation/Gait assistance: Min guard Gait Distance (Feet): 15 Feet Assistive device: Rolling walker (2 wheeled) Gait Pattern/deviations: Step-to pattern;Decreased step length - left;Decreased stance time - right;Antalgic Gait velocity: decreased Gait velocity interpretation: <1.31 ft/sec, indicative of household ambulator General Gait Details: Slow, unsteady gait with decreased stance time RLE and decreased step length LLE. Very slow to move.   Stairs             Wheelchair Mobility    Modified Rankin (Stroke Patients Only) Modified Rankin (Stroke Patients Only) Pre-Morbid Rankin Score: Moderately severe disability Modified Rankin: Moderately severe disability     Balance Overall balance assessment: Needs assistance Sitting-balance support: Feet supported;No upper extremity supported Sitting balance-Leahy Scale: Fair     Standing balance support: During functional activity Standing balance-Leahy Scale: Poor Standing balance comment: reliant on UE support                             Cognition Arousal/Alertness: Awake/alert Behavior During Therapy: WFL for tasks assessed/performed Overall Cognitive Status: History of cognitive impairments - at baseline                                 General Comments: Decreased attention to task, increased processing time      Exercises      General Comments        Pertinent Vitals/Pain Pain Assessment: Faces Faces Pain Scale: Hurts even more Pain Location: Rt hip/knee Pain Descriptors / Indicators: Aching Pain  Intervention(s): Monitored during session    Home Living                      Prior Function            PT Goals (current goals can now be found in the care plan section) Acute Rehab PT Goals Patient Stated Goal: No more strokes, get her hip and knee surgeries done, discharge home today PT Goal Formulation: With patient Time For Goal Achievement:  11/30/18 Potential to Achieve Goals: Good Progress towards PT goals: Progressing toward goals    Frequency    Min 4X/week      PT Plan Current plan remains appropriate    Co-evaluation              AM-PAC PT "6 Clicks" Mobility   Outcome Measure  Help needed turning from your back to your side while in a flat bed without using bedrails?: A Little Help needed moving from lying on your back to sitting on the side of a flat bed without using bedrails?: A Little Help needed moving to and from a bed to a chair (including a wheelchair)?: A Little Help needed standing up from a chair using your arms (e.g., wheelchair or bedside chair)?: A Little Help needed to walk in hospital room?: A Little Help needed climbing 3-5 steps with a railing? : A Lot 6 Click Score: 17    End of Session Equipment Utilized During Treatment: Gait belt Activity Tolerance: Patient tolerated treatment well Patient left: in chair;with call bell/phone within reach Nurse Communication: Mobility status PT Visit Diagnosis: Unsteadiness on feet (R26.81);Difficulty in walking, not elsewhere classified (R26.2)     Time: 7989-2119 PT Time Calculation (min) (ACUTE ONLY): 30 min  Charges:  $Gait Training: 23-37 mins                     Rolinda Roan, PT, DPT Acute Rehabilitation Services Pager: [phone] Office: [phone]    Thelma Comp 11/18/2018, 2:59 PM

## 2018-11-18 NOTE — Consult Note (Signed)
Hospital Consult    Reason for Consult:  Symptomatic right ICA stenosis Referring Physician:  Dr. Erlinda Hong, neurology MRN #:  [id number]  History of Present Illness: This is a 73 y.o. female with history of diabetes, lower extremity neuropathy, and CHF that vascular surgery has been consulted for symptomatic right ICA stenosis.  Patient states she presented on Friday to [facility] with slurred speech as well as left arm weakness and tingling.  She states she did receive TPA prior to transfer.  She states all of her symptoms resolved except for she does have some ongoing weakness in the left hand.  She states she has had at least 7 strokes in the past.  Per her report her last stroke was at least 10 years ago and was related to clot in the base of her brain.  She denies any previous carotid intervention.  She has never had any neck surgery.  She is on Plavix.  No hx of arrhythmia.  CTA neck done at [facility] prior to transfer did show a greater than 70% stenosis of the right ICA with only 30% stenosis on the left.  Her MRI here showed small infarcts in the right basal ganglia with some petechial hemorrhage.  Past Medical History:  Diagnosis Date  . CHF (congestive heart failure) ([place])   . Diabetes mellitus without complication ([place])   . Hyperlipidemia   . Hypertension   . Stroke [facility])     Past Surgical History:  Procedure Laterality Date  . ABDOMINAL HYSTERECTOMY    . APPENDECTOMY    . CARDIAC CATHETERIZATION      No Known Allergies  Prior to Admission medications   Medication Sig Start Date End Date Taking? Authorizing Provider  alendronate (FOSAMAX) 70 MG tablet Take 70 mg by mouth every Wednesday. Take with a full glass of water on an empty stomach.   Yes [provider]  atorvastatin (LIPITOR) 80 MG tablet Take 80 mg by mouth at bedtime.    Yes [provider]  clopidogrel (PLAVIX) 75 MG tablet Take 75 mg by mouth daily.    Yes [provider]  ezetimibe (ZETIA)  10 MG tablet Take 10 mg by mouth daily.    Yes [provider]  gabapentin (NEURONTIN) 300 MG capsule Take 300 mg by mouth at bedtime.    Yes [provider]  hydrALAZINE (APRESOLINE) 25 MG tablet Take 25 mg by mouth 3 (three) times daily.    Yes [provider]  insulin glargine (LANTUS) 100 UNIT/ML injection Inject 35-40 Units into the skin See admin instructions. Inject 35 units into the skin after breakfast and 40 units at bedtime   Yes [provider]  insulin lispro (HUMALOG) 100 UNIT/ML injection Inject 30 Units into the skin 3 (three) times daily with meals.    Yes [provider]  lisinopril (PRINIVIL,ZESTRIL) 40 MG tablet Take 40 mg by mouth daily.    Yes [provider]  nitroGLYCERIN (NITROSTAT) 0.4 MG SL tablet Place 1 tablet under the tongue every 5 (five) minutes as needed for chest pain.    Yes [provider]  olmesartan (BENICAR) 40 MG tablet Take 40 mg by mouth daily.    Yes [provider]  rOPINIRole (REQUIP) 1 MG tablet Take 1 mg by mouth at bedtime.    Yes [provider]  torsemide (DEMADEX) 100 MG tablet Take 100 mg by mouth See admin instructions. Take 100 mg by mouth up to 3 times a week as needed  or fluid or edema   Yes [provider]    Social History   Socioeconomic History  . Marital status: Married    Spouse name: Not on file  . Number of children: Not on file  . Years of education: Not on file  . Highest education level: Not on file  Occupational History  . Not on file  Social Needs  . Financial resource strain: Not on file  . Food insecurity    Worry: Not on file    Inability: Not on file  . Transportation needs    Medical: Not on file    Non-medical: Not on file  Tobacco Use  . Smoking status: Never Smoker  . Smokeless tobacco: Never Used  Substance and Sexual Activity  . Alcohol use: Not Currently  . Drug use: Never  . Sexual activity: Not on file   Lifestyle  . Physical activity    Days per week: Not on file    Minutes per session: Not on file  . Stress: Not on file  Relationships  . Social Musicianconnections    Talks on phone: Not on file    Gets together: Not on file    Attends religious service: Not on file    Active member of club or organization: Not on file    Attends meetings of clubs or organizations: Not on file    Relationship status: Not on file  . Intimate partner violence    Fear of current or ex partner: Not on file    Emotionally abused: Not on file    Physically abused: Not on file    Forced sexual activity: Not on file  Other Topics Concern  . Not on file  Social History Narrative  . Not on file     Family History  Problem Relation Age of Onset  . Stroke Mother   . Prostate cancer Father   . CAD Sister   . CAD Sister   . Valvular heart disease Sister     ROS: [x]  Positive   [ ]  Negative   [ ]  All sytems reviewed and are negative  Cardiovascular: []  chest pain/pressure []  palpitations []  SOB lying flat []  DOE []  pain in legs while walking []  pain in legs at rest []  pain in legs at night []  non-healing ulcers []  hx of DVT []  swelling in legs  Pulmonary: []  productive cough []  asthma/wheezing []  home O2  Neurologic: [x]  weakness in [x]  arms []  legs (left) [x]  numbness in [x]  arms []  legs (left) []  hx of CVA []  mini stroke [] difficulty speaking or slurred speech []  temporary loss of vision in one eye []  dizziness  Hematologic: []  hx of cancer []  bleeding problems []  problems with blood clotting easily  Endocrine:   []  diabetes []  thyroid disease  GI []  vomiting blood []  blood in stool  GU: []  CKD/renal failure []  HD--[]  M/W/F or []  T/T/S []  burning with urination []  blood in urine  Psychiatric: []  anxiety []  depression  Musculoskeletal: []  arthritis []  joint pain  Integumentary: []  rashes []  ulcers  Constitutional: []  fever []  chills   Physical Examination   Vitals:   11/18/18 0330 11/18/18 0817  BP: 128/71 129/82  Pulse: 63 71  Resp: 15 20  Temp: 97.7 F (36.5 C) 97.7 F (36.5 C)  SpO2: 96% 100%   Body mass index is 37.55 kg/m.  General:  WDWN in NAD Gait: Not observed HENT: WNL, normocephalic Pulmonary: normal non-labored breathing, without Rales, rhonchi,  wheezing Cardiac: regular, without  Murmurs, rubs or gallops Abdomen: soft, NT/ND, no masses Skin: without rashes Vascular Exam/Pulses:  Right Left  Radial 2+ (normal) 2+ (normal)  Ulnar    Femoral 2+ (normal) 2+ (normal)  Popliteal    DP 2+ (normal) 2+ (normal)  PT     Extremities: without ischemic changes, without Gangrene , without cellulitis; without open wounds;  Musculoskeletal: no muscle wasting or atrophy  Neurologic: A&O X 3; Appropriate Affect ; SENSATION: normal; MOTOR FUNCTION:  moving all extremities equally. Speech is fluent/normal.  CN II-XII grossly intact.  CBC    Component Value Date/Time   WBC 9.0 11/18/2018 0432   RBC 4.03 11/18/2018 0432   HGB 11.9 (L) 11/18/2018 0432   HCT 35.7 (L) 11/18/2018 0432   PLT 178 11/18/2018 0432   MCV 88.6 11/18/2018 0432   MCH 29.5 11/18/2018 0432   MCHC 33.3 11/18/2018 0432   RDW 13.5 11/18/2018 0432    BMET    Component Value Date/Time   NA 141 11/18/2018 0432   K 3.7 11/18/2018 0432   CL 110 11/18/2018 0432   CO2 25 11/18/2018 0432   GLUCOSE 238 (H) 11/18/2018 0432   BUN 12 11/18/2018 0432   CREATININE 0.69 11/18/2018 0432   CALCIUM 8.5 (L) 11/18/2018 0432   GFRNONAA >60 11/18/2018 0432   GFRAA >60 11/18/2018 0432    COAGS: No results found for: INR, PROTIME   Non-Invasive Vascular Imaging:    I independently [facility] and I agree she has at least a 70% circumferential calcified stenosis in the right ICA at the bifurcation.  The lesion appears fairly low well below the jawline.  No significant contralateral disease.  Left vert is dominant and is  widely patent.   ASSESSMENT/PLAN: This is a 73 y.o. female with symptomatic approximately 70% right ICA stenosis in the setting of recent right basal ganglia infarct with left sided weakness and numbness in her arm and slurred speech.  Discussed with patient that suspect her recent stroke was related to her right carotid stenosis.  We have recommended right carotid endarterectomy for stroke reduction.  I discussed procedure with her in detail.  We will plan for Friday with me for right carotid endarterectomy.  Okay to continue Plavix, but I would hold Brilinta given plan for operative intervention this week.  Risks discussed including bleeding, cranial nerve injury, re-stenosis, risk of peri-operative stroke, etc.  Cephus Shellinghristopher J. Misael Mcgaha, MD [facility] Office: [phone] Pager: [phone]

## 2018-11-18 NOTE — Plan of Care (Signed)
  Problem: Education: Goal: Knowledge of disease or condition will improve Outcome: Adequate for Discharge Goal: Knowledge of secondary prevention will improve Outcome: Adequate for Discharge Goal: Knowledge of patient specific risk factors addressed and post discharge goals established will improve Outcome: Adequate for Discharge Goal: Individualized Educational Video(s) Outcome: Adequate for Discharge   Problem: Health Behavior/Discharge Planning: Goal: Ability to manage health-related needs will improve Outcome: Adequate for Discharge   Problem: Nutrition: Goal: Risk of aspiration will decrease Outcome: Adequate for Discharge   Problem: Ischemic Stroke/TIA Tissue Perfusion: Goal: Complications of ischemic stroke/TIA will be minimized Outcome: Adequate for Discharge   Problem: Education: Goal: Knowledge of General Education information will improve Description: Including pain rating scale, medication(s)/side effects and non-pharmacologic comfort measures Outcome: Adequate for Discharge   Problem: Health Behavior/Discharge Planning: Goal: Ability to manage health-related needs will improve Outcome: Adequate for Discharge   Problem: Clinical Measurements: Goal: Ability to maintain clinical measurements within normal limits will improve Outcome: Adequate for Discharge Goal: Will remain free from infection Outcome: Adequate for Discharge Goal: Diagnostic test results will improve Outcome: Adequate for Discharge Goal: Respiratory complications will improve Outcome: Adequate for Discharge Goal: Cardiovascular complication will be avoided Outcome: Adequate for Discharge   Problem: Activity: Goal: Risk for activity intolerance will decrease Outcome: Adequate for Discharge   Problem: Nutrition: Goal: Adequate nutrition will be maintained Outcome: Adequate for Discharge   Problem: Coping: Goal: Level of anxiety will decrease Outcome: Adequate for Discharge   Problem:  Elimination: Goal: Will not experience complications related to bowel motility Outcome: Adequate for Discharge Goal: Will not experience complications related to urinary retention Outcome: Adequate for Discharge   Problem: Pain Managment: Goal: General experience of comfort will improve Outcome: Adequate for Discharge   Problem: Safety: Goal: Ability to remain free from injury will improve Outcome: Adequate for Discharge   Problem: Skin Integrity: Goal: Risk for impaired skin integrity will decrease Outcome: Adequate for Discharge

## 2018-11-18 NOTE — Progress Notes (Signed)
Occupational Therapy Treatment Patient Details Name: [name] MRN: [id number] DOB: [birth year] Today's Date: 11/18/2018    History of present illness Patient is a 73 y/o female who presents with left sided weakness/numbness and slurred speech. Head CT-unremarkable. CT angiogram- P1 stenosis, 70% stenosis of the right ICA bulb. s/p tPA. Brain MRI showed small Rt basal ganglia infarct. PMH includes multiple strokes,HTN, HLD, DM, CHF.   OT comments  Pt exiting bathroom with PT assist upon arrival to room. Pt continues to require minguard-minA for standing grooming ADL, minA for mobility using RW due to continued chronic R knee/hip pain. Issued pill box test this session. Pt with failing score; she required approx 20 min for test completion and with 17 errors end of test. Reviewed with pt errors and discussed importance of having assist for iADL tasks at home such as medication management to ensure safety and accuracy with task completion. Pt verbalizing understanding and reports has available assist from spouse at home. Recommend pt have continued hands on 24hr assist after discharge home to ensure safety with mobility/ADL tasks and reduce risk for falls. Will continue per POC.    Follow Up Recommendations  Home health OT;Supervision/Assistance - 24 hour;Other (comment)(HHaide)    Equipment Recommendations  None recommended by OT          Precautions / Restrictions Precautions Precautions: Fall Precaution Comments: needs Rt THA and right TKA in the near future Restrictions Weight Bearing Restrictions: No       Mobility Bed Mobility Overal bed mobility: Needs Assistance Bed Mobility: Rolling;Sidelying to Sit Rolling: Min assist Sidelying to sit: Min assist       General bed mobility comments: pt OOB with PT upon arrival  Transfers Overall transfer level: Needs assistance Equipment used: Rolling walker (2 wheeled) Transfers: Sit to/from Stand Sit to Stand: Min assist;From  elevated surface         General transfer comment: pt standing in room upon arrival, with PT    Balance Overall balance assessment: Needs assistance Sitting-balance support: Feet supported;No upper extremity supported Sitting balance-Leahy Scale: Fair     Standing balance support: During functional activity Standing balance-Leahy Scale: Poor Standing balance comment: reliant on UE support                            ADL either performed or assessed with clinical judgement   ADL Overall ADL's : Needs assistance/impaired     Grooming: Min guard;Minimal assistance;Standing;Wash/dry hands Grooming Details (indicate cue type and reason): for standing balance; pt leaning on forearms for increased stability/support                             Functional mobility during ADLs: Minimal assistance;Rolling walker General ADL Comments: issued pill box text this session. pt with failing score; she required approx 20 min to complete and with 17 errors end of task. discussed with pt need for assist with iADL tasks such as medication management, pt verbalizing understanding and reports spouse can assist as well as nurse who comes in x1/week     Vision       Perception     Praxis      Cognition Arousal/Alertness: Awake/alert Behavior During Therapy: WFL for tasks assessed/performed Overall Cognitive Status: History of cognitive impairments - at baseline  General Comments: Decreased attention to task, increased processing time; impaired problem solving noted with completion of pill box test        Exercises     Shoulder Instructions       General Comments      Pertinent Vitals/ Pain       Pain Assessment: Faces Faces Pain Scale: Hurts even more Pain Location: Rt hip/knee Pain Descriptors / Indicators: Aching Pain Intervention(s): Monitored during session  Home Living                                           Prior Functioning/Environment              Frequency  Min 2X/week        Progress Toward Goals  OT Goals(current goals can now be found in the care plan section)  Progress towards OT goals: Progressing toward goals  Acute Rehab OT Goals Patient Stated Goal: No more strokes, get her hip and knee surgeries done, discharge home today OT Goal Formulation: With patient Time For Goal Achievement: 12/01/18 Potential to Achieve Goals: Good ADL Goals Pt Will Perform Grooming: with supervision;standing Pt Will Transfer to Toilet: with supervision;ambulating;regular height toilet;bedside commode;grab bars Pt Will Perform Toileting - Clothing Manipulation and hygiene: with supervision;sit to/from stand  Plan Discharge plan remains appropriate    Co-evaluation                 AM-PAC OT "6 Clicks" Daily Activity     Outcome Measure   Help from another person eating meals?: None Help from another person taking care of personal grooming?: A Little Help from another person toileting, which includes using toliet, bedpan, or urinal?: A Little Help from another person bathing (including washing, rinsing, drying)?: A Lot Help from another person to put on and taking off regular upper body clothing?: A Little Help from another person to put on and taking off regular lower body clothing?: A Lot 6 Click Score: 17    End of Session Equipment Utilized During Treatment: Gait belt;Rolling walker  OT Visit Diagnosis: Hemiplegia and hemiparesis;Pain;Unsteadiness on feet (R26.81) Hemiplegia - Right/Left: Left Hemiplegia - dominant/non-dominant: Non-Dominant Hemiplegia - caused by: Cerebral infarction Pain - Right/Left: Right Pain - part of body: Hip   Activity Tolerance Patient tolerated treatment well   Patient Left in chair;with call bell/phone within reach;with chair alarm set   Nurse Communication Mobility status        Time: 6203-5597 OT Time  Calculation (min): 39 min  Charges: OT General Charges $OT Visit: 1 Visit OT Treatments $Self Care/Home Management : 8-22 mins $Therapeutic Activity: 23-37 mins  Lou Cal, OT [facility] Pager [phone] Office [place] 11/18/2018, 4:21 PM

## 2018-11-18 NOTE — Progress Notes (Signed)
NURSING PROGRESS NOTE  [name] [id number] Discharge Data: 11/18/2018 4:54 PM Attending Provider: Rosalin Hawking, MD INO:MVEH[facility]N, Lora [place], MD     Johny Blamer to be D/C'd Home per MD order.  Discussed with the patient the After Visit Summary and all questions fully answered. All IV's discontinued with no bleeding noted. All belongings returned to patient for patient to take home.   Last Vital Signs:  Blood pressure 129/69, pulse 78, temperature 97.7 F (36.5 C), temperature source Oral, resp. rate 16, height 5\' 3"  (1.6 m), weight 96.2 kg, SpO2 99 %.  Discharge Medication List Allergies as of 11/18/2018   No Known Allergies     Medication List    STOP taking these medications   hydrALAZINE 25 MG tablet Commonly known as: APRESOLINE   olmesartan 40 MG tablet Commonly known as: BENICAR   torsemide 100 MG tablet Commonly known as: DEMADEX     TAKE these medications   alendronate 70 MG tablet Commonly known as: FOSAMAX Take 70 mg by mouth every Wednesday. Take with a full glass of water on an empty stomach. Notes to patient: 11/20/2018   aspirin 81 MG EC tablet Take 1 tablet (81 mg total) by mouth daily for 21 days. Start taking on: November 19, 2018   atorvastatin 80 MG tablet Commonly known as: LIPITOR Take 80 mg by mouth at bedtime. Notes to patient: 11/18/2018   clopidogrel 75 MG tablet Commonly known as: PLAVIX Take 75 mg by mouth daily. Notes to patient: 11/19/2018   clotrimazole 1 % vaginal cream Commonly known as: GYNE-LOTRIMIN Place 1 Applicatorful vaginally at bedtime. Notes to patient: 11/18/2018   ezetimibe 10 MG tablet Commonly known as: ZETIA Take 10 mg by mouth daily. Notes to patient: 11/18/2018   gabapentin 300 MG capsule Commonly known as: NEURONTIN Take 300 mg by mouth at bedtime. Notes to patient: 11/18/2018   glimepiride 4 MG tablet Commonly known as: AMARYL Take 1 tablet (4 mg total) by mouth 2 (two) times daily with a meal. What changed:  when to take this Notes to patient: 11/18/2018   insulin lispro 100 UNIT/ML injection Commonly known as: HUMALOG Inject 30 Units into the skin 3 (three) times daily with meals. Notes to patient: 11/18/2018   Lantus 100 UNIT/ML injection Generic drug: insulin glargine Inject 35-40 Units into the skin See admin instructions. Inject 35 units into the skin after breakfast and 40 units at bedtime Notes to patient: 11/18/2018   lisinopril 40 MG tablet Commonly known as: ZESTRIL Take 0.5 tablets (20 mg total) by mouth daily. What changed: how much to take Notes to patient: 11/19/2018   nitroGLYCERIN 0.4 MG SL tablet Commonly known as: NITROSTAT Place 1 tablet under the tongue every 5 (five) minutes as needed for chest pain.   rOPINIRole 1 MG tablet Commonly known as: REQUIP Take 1 mg by mouth at bedtime. Notes to patient: 11/18/2018            Durable Medical Equipment  (From admission, onward)         Start     Ordered   11/18/18 1505  For home use only DME 3 n 1  Once     11/18/18 1504

## 2018-11-18 NOTE — TOC Initial Note (Signed)
Transition of Care [facility]) - Initial/Assessment Note    Patient Details  Name: [name] MRN: [id number] Date of Birth: May 13, 1945  [facility] CM/SW Contact:    Pollie Friar, RN Phone Number: 11/18/2018, 3:57 PM  Clinical Narrative:                   Expected Discharge Plan: [place] Barriers to Discharge: No Barriers Identified   Patient Goals and CMS Choice   CMS Medicare.gov Compare Post Acute Care list provided to:: Patient Choice offered to / list presented to : Patient  Expected Discharge Plan and Services Expected Discharge Plan: [place]   Discharge Planning Services: CM Consult Post Acute Care Choice: Home Health, Durable Medical Equipment   Expected Discharge Date: 11/18/18               DME Arranged: 3-N-1 DME Agency: [employer] Date DME Agency Contacted: 11/18/18   Representative spoke with at DME Agency: [place]: OT, PT, Speech Therapy, RN [facility] Agency: [place] [facility] [place] Date [place]: 11/18/18   Representative spoke with at [place]: [place]  Prior Living Arrangements/Services   Lives with:: Spouse Patient language and need for interpreter reviewed:: Yes(no needs) Do you feel safe going back to the place where you live?: Yes      Need for Family Participation in Patient Care: Yes (Comment) Care giver support system in place?: Yes (comment)(spouse can provide 24 hour supervision) Current home services: DME(walker and shower seat) Criminal Activity/Legal Involvement Pertinent to Current Situation/Hospitalization: No - Comment as needed  Activities of Daily Living Home Assistive Devices/Equipment: Gilford Rile (specify type) ADL Screening (condition at time of admission) Patient's cognitive ability adequate to safely complete daily activities?: Yes Is the patient deaf or have difficulty hearing?: No Does the patient have difficulty seeing, even when wearing  glasses/contacts?: No Does the patient have difficulty concentrating, remembering, or making decisions?: No Patient able to express need for assistance with ADLs?: Yes Does the patient have difficulty dressing or bathing?: No Independently performs ADLs?: No Does the patient have difficulty walking or climbing stairs?: Yes Weakness of Legs: Both Weakness of Arms/Hands: Left  Permission Sought/Granted                  Emotional Assessment Appearance:: Appears stated age Attitude/Demeanor/Rapport: Engaged Affect (typically observed): Accepting, Pleasant Orientation: : Oriented to Self, Oriented to Place, Oriented to  Time, Oriented to Situation   Psych Involvement: No (comment)  Admission diagnosis:  STROKE Patient Active Problem List   Diagnosis Date Noted  . Carotid stenosis, right 11/18/2018  . Diabetes mellitus type II, uncontrolled ([place]) 11/18/2018  . Family hx-stroke 11/18/2018  . Cerebral infarction due to stenosis of right carotid artery ([place]) s/p tPA 11/15/2018  . Hyperlipidemia 01/14/2018  . COPD (chronic obstructive pulmonary disease) ([place]) 01/14/2018  . Angina pectoris ([place]) 07/28/2016  . Chronic diastolic congestive heart failure ([place]) 07/28/2016  . Hypertensive heart disease with heart failure ([place]) 07/28/2016  . Diabetic polyneuropathy associated with type 2 diabetes mellitus ([place]) 11/30/2015   PCP:  Nicoletta Dress, MD Pharmacy:   [place], [place] [address] Phone: [phone] Fax: [phone]  [facility] [place]. [place], [place] [address] [place] [address] Phone: [phone] Fax: [phone]  Social Determinants of Health (SDOH) Interventions    Readmission Risk Interventions No flowsheet data found.

## 2018-11-18 NOTE — Progress Notes (Signed)
Inpatient Diabetes Program Recommendations  AACE/ADA: New Consensus Statement on Inpatient Glycemic Control (2015)  Target Ranges:  Prepandial:   less than 140 mg/dL      Peak postprandial:   less than 180 mg/dL (1-2 hours)      Critically ill patients:  140 - 180 mg/dL   Results for THEDA, PAYER (MRN [id number]) as of 11/18/2018 09:13  Ref. Range 11/17/2018 06:22 11/17/2018 12:40 11/17/2018 16:52 11/17/2018 21:43 11/18/2018 06:16  Glucose-Capillary Latest Ref Range: 70 - 99 mg/dL 234 (H) 266 (H) 268 (H) 264 (H) 226 (H)   Review of Glycemic Control  Diabetes history: DM 2 Outpatient Diabetes medications: Lantus 35 units qam, 40 units qpm, Humalog 30 units tid  Current orders for Inpatient glycemic control:  Novolog 0-20 units tid  Novolog 0-5 units qhs Amaryl 4 mg bid  Inpatient Diabetes Program Recommendations:    Noted A1c in process  Consider discontinuing oral medications while in the hospital.  Patient takes long acting basal insulin at home. Glucose trends in the 200's. Please consider adding Lantus 15 units.  Thanks,  Tama Headings RN, MSN, BC-ADM Inpatient Diabetes Coordinator Team Pager [phone] (8a-5p)

## 2018-11-18 NOTE — Progress Notes (Addendum)
  Speech Language Pathology Treatment: Cognitive-Linquistic  Patient Details Name: [name] MRN: [id number] DOB: 1946/02/12 Today's Date: 11/18/2018 Time: 0910-0921 SLP Time Calculation (min) (ACUTE ONLY): 11 min  Assessment / Plan / Recommendation Clinical Impression  Separate SLP session focused on use of memory compensation strategies in functional setting.  Pt verbalized worsening symptoms last night of "shooting pain from left side of head to her feet" with some "tingling". She admits she did NOT inform the RN of this occurrence.  SLP advised she inform MD with pt requesting to inform MD of 3 bits of information but with decreased ability to recall all 3 times within 1 minute ("yeast", "fuzzy vision" and "left shooting pain, tingling".  Pt states her vision is "fuzzy" on external half of each eye.    Thus using teach back and written cues, SLP helped pt to use compensation strategy of written reminder with mod verbal/visual cues.  Pt able to read said information and verbalized importance of relaying to MD.  SLP left pt's reminder note in her lap within reach for functional use.  SLP will follow up to provide pt with more functional memory compensation strategies.   Of note, pt reports poor sleep last night and thus question fatigue impact on cognitive ability.  Pt agreeable to plan for follow up.    HPI HPI: 73 yo female adm to [facility] with dysarthria and left sided weakness.  Pt found to have right basal ganglia cva per MRI study.  CT head 7/12 showed continued evolution of right basal ganglia cva otherwise negative.  PMH + for multiple strokes, DM, HTN, HLD, CHF.  Pt required Rehab stay at [facility] approximately 10 years ago after a stroke.      SLP Plan     Patient needs continued Speech Lanaguage Pathology Services    Recommendations                   Follow up Recommendations: Home health SLP SLP Visit Diagnosis: Cognitive communication deficit (R41.841)       GO                 Macario Golds 11/18/2018, 10:26 AM Luanna Salk, MS [facility] SLP Acute Rehab Services Pager [phone] Office [phone]

## 2018-11-18 NOTE — Discharge Summary (Addendum)
Stroke Discharge Summary  Patient ID: [name]   MRN: [id number]      DOB: [birth year]  Date of Admission: 11/15/2018 Date of Discharge: 11/18/2018  Attending Physician:  Rosalin Hawking, MD, Stroke MD Consultant(s):   Marty Heck, MD vascular surgery  Patient's PCP:  Nicoletta Dress, MD  DISCHARGE DIAGNOSIS:  Principal Problem:   Cerebral infarction due to stenosis of right carotid artery [facility]) s/p tPA Active Problems:   Diabetic polyneuropathy associated with type 2 diabetes mellitus ([place])   Hypertensive heart disease with heart failure ([place])   Hyperlipidemia   Carotid stenosis, right   Diabetes mellitus type II, uncontrolled ([place])   Family hx-stroke   Past Medical History:  Diagnosis Date  . CHF (congestive heart failure) ([place])   . Diabetes mellitus without complication ([place])   . Hyperlipidemia   . Hypertension   . Stroke [facility])    Past Surgical History:  Procedure Laterality Date  . ABDOMINAL HYSTERECTOMY    . APPENDECTOMY    . CARDIAC CATHETERIZATION      Allergies as of 11/18/2018   No Known Allergies     Medication List    STOP taking these medications   hydrALAZINE 25 MG tablet Commonly known as: APRESOLINE   olmesartan 40 MG tablet Commonly known as: BENICAR   torsemide 100 MG tablet Commonly known as: DEMADEX     TAKE these medications   alendronate 70 MG tablet Commonly known as: FOSAMAX Take 70 mg by mouth every Wednesday. Take with a full glass of water on an empty stomach.   aspirin 81 MG EC tablet Take 1 tablet (81 mg total) by mouth daily for 21 days. Start taking on: November 19, 2018   atorvastatin 80 MG tablet Commonly known as: LIPITOR Take 80 mg by mouth at bedtime.   clopidogrel 75 MG tablet Commonly known as: PLAVIX Take 75 mg by mouth daily.   clotrimazole 1 % vaginal cream Commonly known as: GYNE-LOTRIMIN Place 1 Applicatorful vaginally at bedtime.   ezetimibe 10 MG tablet Commonly known as: ZETIA Take 10 mg  by mouth daily.   gabapentin 300 MG capsule Commonly known as: NEURONTIN Take 300 mg by mouth at bedtime.   glimepiride 4 MG tablet Commonly known as: AMARYL Take 1 tablet (4 mg total) by mouth 2 (two) times daily with a meal. What changed: when to take this   insulin lispro 100 UNIT/ML injection Commonly known as: HUMALOG Inject 30 Units into the skin 3 (three) times daily with meals.   Lantus 100 UNIT/ML injection Generic drug: insulin glargine Inject 35-40 Units into the skin See admin instructions. Inject 35 units into the skin after breakfast and 40 units at bedtime   lisinopril 40 MG tablet Commonly known as: ZESTRIL Take 0.5 tablets (20 mg total) by mouth daily. What changed: how much to take   nitroGLYCERIN 0.4 MG SL tablet Commonly known as: NITROSTAT Place 1 tablet under the tongue every 5 (five) minutes as needed for chest pain.   rOPINIRole 1 MG tablet Commonly known as: REQUIP Take 1 mg by mouth at bedtime.       LABORATORY STUDIES CBC    Component Value Date/Time   WBC 9.0 11/18/2018 0432   RBC 4.03 11/18/2018 0432   HGB 11.9 (L) 11/18/2018 0432   HCT 35.7 (L) 11/18/2018 0432   PLT 178 11/18/2018 0432   MCV 88.6 11/18/2018 0432   MCH 29.5 11/18/2018 0432   MCHC 33.3  11/18/2018 0432   RDW 13.5 11/18/2018 0432   CMP    Component Value Date/Time   NA 141 11/18/2018 0432   K 3.7 11/18/2018 0432   CL 110 11/18/2018 0432   CO2 25 11/18/2018 0432   GLUCOSE 238 (H) 11/18/2018 0432   BUN 12 11/18/2018 0432   CREATININE 0.69 11/18/2018 0432   CALCIUM 8.5 (L) 11/18/2018 0432   PROT 6.1 (L) 11/16/2018 0553   ALBUMIN 3.1 (L) 11/16/2018 0553   AST 9 (L) 11/16/2018 0553   ALT 12 11/16/2018 0553   ALKPHOS 65 11/16/2018 0553   BILITOT 0.7 11/16/2018 0553   GFRNONAA >60 11/18/2018 0432   GFRAA >60 11/18/2018 0432   COAGSNo results found for: INR, PROTIME Lipid Panel    Component Value Date/Time   CHOL 226 (H) 11/16/2018 0315   TRIG 124 11/16/2018  0315   HDL 44 11/16/2018 0315   CHOLHDL 5.1 11/16/2018 0315   VLDL 25 11/16/2018 0315   LDLCALC 157 (H) 11/16/2018 0315   HgbA1C  Lab Results  Component Value Date   HGBA1C 10.4 (H) 11/16/2018    SIGNIFICANT DIAGNOSTIC STUDIES Mr Brain Wo Contrast 11/16/2018 1. Small infarcts of the right basal ganglia corpus striatum with petechial hemorrhage, Heidelberg classification 1b: HI2, confluent petechiae, no mass effect.  2. Chronic small vessel ischemia and generalized volume loss.   Ct Head Wo Contrast 11/17/2018 1. Continued normal interval evolution of acute right basal ganglia infarct, stable in size morphology from previous. Associated faint petechial hemorrhage without evidence for hemorrhagic transformation, stable.  2. No other new acute intracranial abnormality.   Transthoracic Echocardiogram  11/16/2018 1. The left ventricle has normal systolic function with an ejection fraction of 60-65%. The cavity size was normal. Left ventricular diastolic Doppler parameters are consistent with impaired relaxation. Elevated mean left atrial pressure. 2. The right ventricle has normal systolic function. The cavity was normal. There is no increase in right ventricular wall thickness. 3. The mitral valve is degenerative. Mild thickening of the mitral valve leaflet. Mild calcification of the mitral valve leaflet. There is severe mitral annular calcification present. The MR jet is posteriorly-directed. 4. The aortic valve is tricuspid. Mild thickening of the aortic valve. Mild calcification of the aortic valve. 5. No intracardiac thrombi or masses were visualized.    HISTORY OF PRESENT ILLNESS [name] is a 73 y.o. female with past medical history of hypertension, diabetes, stroke presented [facility] room with sudden onset left-sided facial weakness and numbness, slurred speech that began at 12:30 PM on 11/15/2018 (LKW).  [facility] and  received IV TPA after negative CT head. Patient is on Plavix at home.  [facility] after discussion with Dr. Amada JupiterKirkpatrick, on call neurologist.  Patient arrived to the neuro ICU around 10 PM, symptoms had significantly improved. Patient's blood pressure within 180 systolic.  [facility] head: No hemorrhage aspects 10 CT angiogram showed no intracranial stenosis-apart from a P1 stenosis, 70% stenosis of the right ICA bulb and 30% on the left ICA at the bulb.  Has 50% stenosis of the right vertebral artery at the origin. CBC: Leukocytosis with WBC 14.6.  Hemoglobin 14.5, platelets 246, INR 1.0, sodium 138,  Potassium 2.9   HOSPITAL COURSE [name] is a 73 y.o. female with history of hypertension, Hld, CHF, diabetes, and previous stroke presenting [facility] with sudden onset left-sided facial weakness  and numbness, slurred speech. She [facility]  11/15/18 per Tele-Neurology and transferred [facility].  Stroke: right BG/CR small infarct with petechial HT, likely due to right ICA high grade stenosis  CT head - OSH - No hemorrhage aspects 10  CT Repeat 11/17/2018 - Continued normal interval evolution of acute right basal ganglia infarct, faint petechial hemorrhage without evidence for hemorrhagic transformation.   MRI head 11/16/2018 - Small infarcts of the right basal ganglia corpus striatum with petechial hemorrhage   CTA H&N - OSH - no intracranial stenosis-apart from a P1 stenosis, 70% stenosis of the right ICA bulb. Has 50% stenosis of the right VA at the origin.  2D Echo  - EF 60 - 65%. No cardiac source of emboli identified.   [employer] Corona Virus 2 - not performed here.  LDL - 157  HgbA1c - 10.3  clopidogrel 75 mg daily prior to admission, now on DAPT for 3 weeks and then plavix alone  Therapy recommendations:  HH PT, HH OT, HH SLP  Disposition:  return  home  For R CEA this Friday  Carotid stenosis  Right ICA bulb 70% stenosis  Likely the cause of current stroke  Dr. Chestine Sporelark consulted, for right CEA Friday. Continue ASA and Plavix  Hypertension  Blood pressure somewhat high at times but within post stroke parameters  Permissive hypertension allowed in ospital  Avoid low BP give right ICA stenosis  Goal BP now 130-150  Long-term BP goal normotensive  Hyperlipidemia  Lipid lowering medication PTA:  Lipitor 80 mg daily and Zetia 10 mg daily  LDL 157, goal < 70  Now on Lipitor 80 mg daily and Zetia 10 mg daily  Continue statin and zetia at discharge  Diabetes, Uncontrolled Diabetic Polyneuropathy  HgbA1c 10.3, goal < 7.0  Hyperglycemia  DM coordinator on board  Resume home insulin on discharge  Close PCP follow up  Other Stroke Risk Factors  Advanced age  ETOH use, advised to drink no more than 1 alcoholic beverage per day.  Obesity, Body mass index is 37.55 kg/m., recommend weight loss, diet and exercise as appropriate   Hx stroke/TIA  Family hx stroke (mother)  Other Active Problems  Hypokalemia 3.2 - supplement - 3.7  DISCHARGE EXAM Blood pressure 129/69, pulse 78, temperature 97.7 F (36.5 C), temperature source Oral, resp. rate 16, height 5\' 3"  (1.6 m), weight 96.2 kg, SpO2 99 %. NAD, pleasant                  Speech:    Speech is normal; fluent and spontaneous with normal comprehension.  Cognition:    The patient is oriented to person, place, and time;     recent and remote memory intact;     language fluent;    Cranial Nerves:    The pupils are equal, round, and reactive to light.Trigeminal sensation is intact and the muscles of mastication are normal. The face is symmetric. The palate elevates in the midline. Hearing intact. Voice is normal. Shoulder shrug is normal. The tongue has normal motion without fasciculations.  Coordination:  No dysmetria Motor Observation:    No  asymmetry, no atrophy, and no involuntary movements noted. Tone:    Normal muscle tone.   Strength: Mild left extremity weakness 4/5, otherwise strength is V/V in the upper and lower limbs.  Sensation: intact to LT  Discharge Diet    Carb modified thin liquids  DISCHARGE PLAN  Disposition:  Home  Home  health PT, OT, SLP  Return Friday 11/22/2018 for R CEA by Dr. Chestine Sporelark  aspirin 81 mg daily and clopidogrel 75 mg daily for secondary stroke prevention x 3 weeks then plavix alone  Ongoing stroke risk factor control by Primary Care Physician at time of discharge  Follow-up Paulina FusiSchultz, Douglas E, MD in 3 weeks.  Follow-up in [facility] in 5-6 weeks, office to schedule an appointment.   45 minutes were spent preparing discharge.  Marvel PlanJindong Cruise Baumgardner, MD PhD Stroke Neurology 11/18/2018 6:08 PM

## 2018-11-19 DIAGNOSIS — E1142 Type 2 diabetes mellitus with diabetic polyneuropathy: Secondary | ICD-10-CM | POA: Diagnosis not present

## 2018-11-19 DIAGNOSIS — I509 Heart failure, unspecified: Secondary | ICD-10-CM | POA: Diagnosis not present

## 2018-11-19 DIAGNOSIS — M810 Age-related osteoporosis without current pathological fracture: Secondary | ICD-10-CM | POA: Diagnosis not present

## 2018-11-19 DIAGNOSIS — I110 Hypertensive heart disease with heart failure: Secondary | ICD-10-CM | POA: Diagnosis not present

## 2018-11-19 DIAGNOSIS — I2510 Atherosclerotic heart disease of native coronary artery without angina pectoris: Secondary | ICD-10-CM | POA: Diagnosis not present

## 2018-11-19 DIAGNOSIS — Z7902 Long term (current) use of antithrombotics/antiplatelets: Secondary | ICD-10-CM | POA: Diagnosis not present

## 2018-11-20 DIAGNOSIS — E1165 Type 2 diabetes mellitus with hyperglycemia: Secondary | ICD-10-CM | POA: Diagnosis not present

## 2018-11-20 DIAGNOSIS — M25552 Pain in left hip: Secondary | ICD-10-CM | POA: Diagnosis not present

## 2018-11-20 DIAGNOSIS — I69354 Hemiplegia and hemiparesis following cerebral infarction affecting left non-dominant side: Secondary | ICD-10-CM | POA: Diagnosis not present

## 2018-11-20 DIAGNOSIS — S0990XA Unspecified injury of head, initial encounter: Secondary | ICD-10-CM | POA: Diagnosis not present

## 2018-11-20 DIAGNOSIS — M546 Pain in thoracic spine: Secondary | ICD-10-CM | POA: Diagnosis not present

## 2018-11-20 DIAGNOSIS — I10 Essential (primary) hypertension: Secondary | ICD-10-CM | POA: Diagnosis not present

## 2018-11-20 DIAGNOSIS — M25522 Pain in left elbow: Secondary | ICD-10-CM | POA: Diagnosis not present

## 2018-11-20 DIAGNOSIS — M25462 Effusion, left knee: Secondary | ICD-10-CM | POA: Diagnosis not present

## 2018-11-20 DIAGNOSIS — S79912A Unspecified injury of left hip, initial encounter: Secondary | ICD-10-CM | POA: Diagnosis not present

## 2018-11-20 DIAGNOSIS — R531 Weakness: Secondary | ICD-10-CM | POA: Diagnosis not present

## 2018-11-20 DIAGNOSIS — S199XXA Unspecified injury of neck, initial encounter: Secondary | ICD-10-CM | POA: Diagnosis not present

## 2018-11-20 DIAGNOSIS — M25562 Pain in left knee: Secondary | ICD-10-CM | POA: Diagnosis not present

## 2018-11-20 DIAGNOSIS — W19XXXA Unspecified fall, initial encounter: Secondary | ICD-10-CM | POA: Diagnosis not present

## 2018-11-20 DIAGNOSIS — M542 Cervicalgia: Secondary | ICD-10-CM | POA: Diagnosis not present

## 2018-11-20 DIAGNOSIS — S59902A Unspecified injury of left elbow, initial encounter: Secondary | ICD-10-CM | POA: Diagnosis not present

## 2018-11-20 DIAGNOSIS — R52 Pain, unspecified: Secondary | ICD-10-CM | POA: Diagnosis not present

## 2018-11-21 ENCOUNTER — Other Ambulatory Visit: Payer: Self-pay

## 2018-11-21 ENCOUNTER — Emergency Department (HOSPITAL_COMMUNITY): Payer: Medicare Other

## 2018-11-21 ENCOUNTER — Inpatient Hospital Stay (HOSPITAL_COMMUNITY)
Admission: EM | Admit: 2018-11-21 | Discharge: 2018-11-26 | DRG: 038 | Disposition: A | Discharge status: Skilled Nursing Facility | Payer: Medicare Other | Attending: Internal Medicine | Admitting: Internal Medicine

## 2018-11-21 ENCOUNTER — Encounter (HOSPITAL_COMMUNITY): Payer: Self-pay | Admitting: Emergency Medicine

## 2018-11-21 DIAGNOSIS — W19XXXA Unspecified fall, initial encounter: Secondary | ICD-10-CM

## 2018-11-21 DIAGNOSIS — Z90710 Acquired absence of both cervix and uterus: Secondary | ICD-10-CM

## 2018-11-21 DIAGNOSIS — Z794 Long term (current) use of insulin: Secondary | ICD-10-CM

## 2018-11-21 DIAGNOSIS — G8194 Hemiplegia, unspecified affecting left nondominant side: Secondary | ICD-10-CM | POA: Diagnosis present

## 2018-11-21 DIAGNOSIS — E785 Hyperlipidemia, unspecified: Secondary | ICD-10-CM | POA: Diagnosis present

## 2018-11-21 DIAGNOSIS — S0990XA Unspecified injury of head, initial encounter: Secondary | ICD-10-CM | POA: Diagnosis not present

## 2018-11-21 DIAGNOSIS — I10 Essential (primary) hypertension: Secondary | ICD-10-CM | POA: Diagnosis not present

## 2018-11-21 DIAGNOSIS — M25562 Pain in left knee: Secondary | ICD-10-CM

## 2018-11-21 DIAGNOSIS — I6521 Occlusion and stenosis of right carotid artery: Secondary | ICD-10-CM | POA: Diagnosis not present

## 2018-11-21 DIAGNOSIS — Z1159 Encounter for screening for other viral diseases: Secondary | ICD-10-CM

## 2018-11-21 DIAGNOSIS — F1010 Alcohol abuse, uncomplicated: Secondary | ICD-10-CM | POA: Diagnosis present

## 2018-11-21 DIAGNOSIS — I110 Hypertensive heart disease with heart failure: Secondary | ICD-10-CM | POA: Diagnosis present

## 2018-11-21 DIAGNOSIS — M25462 Effusion, left knee: Secondary | ICD-10-CM | POA: Diagnosis present

## 2018-11-21 DIAGNOSIS — I509 Heart failure, unspecified: Secondary | ICD-10-CM | POA: Diagnosis present

## 2018-11-21 DIAGNOSIS — F4310 Post-traumatic stress disorder, unspecified: Secondary | ICD-10-CM | POA: Diagnosis present

## 2018-11-21 DIAGNOSIS — Z79899 Other long term (current) drug therapy: Secondary | ICD-10-CM

## 2018-11-21 DIAGNOSIS — R51 Headache: Secondary | ICD-10-CM | POA: Diagnosis not present

## 2018-11-21 DIAGNOSIS — E119 Type 2 diabetes mellitus without complications: Secondary | ICD-10-CM | POA: Diagnosis present

## 2018-11-21 DIAGNOSIS — Z7902 Long term (current) use of antithrombotics/antiplatelets: Secondary | ICD-10-CM

## 2018-11-21 DIAGNOSIS — I959 Hypotension, unspecified: Secondary | ICD-10-CM | POA: Diagnosis not present

## 2018-11-21 DIAGNOSIS — R0689 Other abnormalities of breathing: Secondary | ICD-10-CM | POA: Diagnosis not present

## 2018-11-21 DIAGNOSIS — R52 Pain, unspecified: Secondary | ICD-10-CM | POA: Diagnosis not present

## 2018-11-21 HISTORY — DX: Unspecified fall, initial encounter: W19.XXXA

## 2018-11-21 LAB — I-STAT CHEM 8, ED
BUN: 15 mg/dL (ref 8–23)
Calcium, Ion: 1.18 mmol/L (ref 1.15–1.40)
Chloride: 102 mmol/L (ref 98–111)
Creatinine, Ser: 0.7 mg/dL (ref 0.44–1.00)
Glucose, Bld: 65 mg/dL — ABNORMAL LOW (ref 70–99)
HCT: 37 % (ref 36.0–46.0)
Hemoglobin: 12.6 g/dL (ref 12.0–15.0)
Potassium: 3.3 mmol/L — ABNORMAL LOW (ref 3.5–5.1)
Sodium: 141 mmol/L (ref 135–145)
TCO2: 28 mmol/L (ref 22–32)

## 2018-11-21 LAB — DIFFERENTIAL
Abs Immature Granulocytes: 0.06 10*3/uL (ref 0.00–0.07)
Basophils Absolute: 0 10*3/uL (ref 0.0–0.1)
Basophils Relative: 0 %
Eosinophils Absolute: 0.1 10*3/uL (ref 0.0–0.5)
Eosinophils Relative: 1 %
Immature Granulocytes: 0 %
Lymphocytes Relative: 12 %
Lymphs Abs: 1.6 10*3/uL (ref 0.7–4.0)
Monocytes Absolute: 1.1 10*3/uL — ABNORMAL HIGH (ref 0.1–1.0)
Monocytes Relative: 8 %
Neutro Abs: 10.9 10*3/uL — ABNORMAL HIGH (ref 1.7–7.7)
Neutrophils Relative %: 79 %

## 2018-11-21 LAB — CBC
HCT: 41.2 % (ref 36.0–46.0)
Hemoglobin: 13.6 g/dL (ref 12.0–15.0)
MCH: 29.2 pg (ref 26.0–34.0)
MCHC: 33 g/dL (ref 30.0–36.0)
MCV: 88.6 fL (ref 80.0–100.0)
Platelets: 198 10*3/uL (ref 150–400)
RBC: 4.65 MIL/uL (ref 3.87–5.11)
RDW: 13.5 % (ref 11.5–15.5)
WBC: 13.9 10*3/uL — ABNORMAL HIGH (ref 4.0–10.5)
nRBC: 0 % (ref 0.0–0.2)

## 2018-11-21 LAB — COMPREHENSIVE METABOLIC PANEL
ALT: 15 U/L (ref 0–44)
AST: 13 U/L — ABNORMAL LOW (ref 15–41)
Albumin: 3.5 g/dL (ref 3.5–5.0)
Alkaline Phosphatase: 70 U/L (ref 38–126)
Anion gap: 11 (ref 5–15)
BUN: 14 mg/dL (ref 8–23)
CO2: 26 mmol/L (ref 22–32)
Calcium: 9.5 mg/dL (ref 8.9–10.3)
Chloride: 103 mmol/L (ref 98–111)
Creatinine, Ser: 0.81 mg/dL (ref 0.44–1.00)
GFR calc Af Amer: >60 mL/min (ref >60)
GFR calc non Af Amer: >60 mL/min (ref >60)
Glucose, Bld: 77 mg/dL (ref 70–99)
Potassium: 3.3 mmol/L — ABNORMAL LOW (ref 3.5–5.1)
Sodium: 140 mmol/L (ref 135–145)
Total Bilirubin: 0.7 mg/dL (ref 0.3–1.2)
Total Protein: 6.9 g/dL (ref 6.5–8.1)

## 2018-11-21 LAB — GLUCOSE, CAPILLARY
Glucose-Capillary: 40 mg/dL — CL (ref 70–99)
Glucose-Capillary: 90 mg/dL (ref 70–99)

## 2018-11-21 LAB — URINALYSIS, ROUTINE W REFLEX MICROSCOPIC
Bilirubin Urine: NEGATIVE
Glucose, UA: NEGATIVE mg/dL
Ketones, ur: NEGATIVE mg/dL
Nitrite: NEGATIVE
Protein, ur: NEGATIVE mg/dL
Specific Gravity, Urine: 1.008 (ref 1.005–1.030)
WBC, UA: >50 WBC/hpf — ABNORMAL HIGH (ref 0–5)
pH: 6 (ref 5.0–8.0)

## 2018-11-21 LAB — SARS CORONAVIRUS 2 BY RT PCR (HOSPITAL ORDER, PERFORMED IN ~~LOC~~ HOSPITAL LAB): SARS Coronavirus 2: NEGATIVE

## 2018-11-21 LAB — PROTIME-INR
INR: 1 (ref 0.8–1.2)
Prothrombin Time: 13.2 seconds (ref 11.4–15.2)

## 2018-11-21 LAB — SURGICAL PCR SCREEN
MRSA, PCR: POSITIVE — AB
Staphylococcus aureus: POSITIVE — AB

## 2018-11-21 LAB — RAPID URINE DRUG SCREEN, HOSP PERFORMED
Amphetamines: NOT DETECTED
Barbiturates: NOT DETECTED
Benzodiazepines: NOT DETECTED
Cocaine: NOT DETECTED
Opiates: NOT DETECTED
Tetrahydrocannabinol: NOT DETECTED

## 2018-11-21 LAB — APTT: aPTT: 25 seconds (ref 24–36)

## 2018-11-21 LAB — ETHANOL: Alcohol, Ethyl (B): <10 mg/dL (ref <10)

## 2018-11-21 MED ORDER — GABAPENTIN 300 MG PO CAPS
300.0000 mg | ORAL_CAPSULE | Freq: Every day | ORAL | Status: DC
  Administered 2018-11-21 – 2018-11-25 (×5): 300 mg via ORAL
  Filled 2018-11-21 (×5): qty 1

## 2018-11-21 MED ORDER — INSULIN ASPART 100 UNIT/ML ~~LOC~~ SOLN
0.0000 [IU] | SUBCUTANEOUS | Status: DC
  Administered 2018-11-22: 7 [IU] via SUBCUTANEOUS
  Administered 2018-11-22: 2 [IU] via SUBCUTANEOUS
  Administered 2018-11-22 (×2): 3 [IU] via SUBCUTANEOUS
  Administered 2018-11-22: 7 [IU] via SUBCUTANEOUS
  Administered 2018-11-23 (×2): 3 [IU] via SUBCUTANEOUS
  Administered 2018-11-23: 5 [IU] via SUBCUTANEOUS
  Administered 2018-11-23 (×3): 3 [IU] via SUBCUTANEOUS
  Administered 2018-11-24: 2 [IU] via SUBCUTANEOUS

## 2018-11-21 MED ORDER — POTASSIUM CHLORIDE CRYS ER 20 MEQ PO TBCR
40.0000 meq | EXTENDED_RELEASE_TABLET | Freq: Once | ORAL | Status: AC
  Administered 2018-11-21: 40 meq via ORAL
  Filled 2018-11-21: qty 2

## 2018-11-21 MED ORDER — HYDROCODONE-ACETAMINOPHEN 5-325 MG PO TABS
1.0000 | ORAL_TABLET | Freq: Once | ORAL | Status: AC
  Administered 2018-11-21: 1 via ORAL
  Filled 2018-11-21: qty 1

## 2018-11-21 MED ORDER — CLOPIDOGREL BISULFATE 75 MG PO TABS
75.0000 mg | ORAL_TABLET | Freq: Every day | ORAL | Status: DC
  Administered 2018-11-23 – 2018-11-26 (×4): 75 mg via ORAL
  Filled 2018-11-21 (×4): qty 1

## 2018-11-21 MED ORDER — INSULIN ASPART 100 UNIT/ML ~~LOC~~ SOLN: 0.0000 [IU] | Freq: Three times a day (TID) | SUBCUTANEOUS | Status: DC

## 2018-11-21 MED ORDER — ENOXAPARIN SODIUM 40 MG/0.4ML ~~LOC~~ SOLN
40.0000 mg | SUBCUTANEOUS | Status: DC
  Administered 2018-11-21: 40 mg via SUBCUTANEOUS
  Filled 2018-11-21: qty 0.4

## 2018-11-21 MED ORDER — ACETAMINOPHEN 325 MG PO TABS
650.0000 mg | ORAL_TABLET | Freq: Four times a day (QID) | ORAL | Status: DC | PRN
  Administered 2018-11-21: 650 mg via ORAL
  Filled 2018-11-21: qty 2

## 2018-11-21 MED ORDER — LISINOPRIL 10 MG PO TABS: 20.0000 mg | ORAL_TABLET | Freq: Every day | ORAL | Status: DC

## 2018-11-21 MED ORDER — EZETIMIBE 10 MG PO TABS
10.0000 mg | ORAL_TABLET | Freq: Every day | ORAL | Status: DC
  Administered 2018-11-23 – 2018-11-26 (×4): 10 mg via ORAL
  Filled 2018-11-21 (×4): qty 1

## 2018-11-21 MED ORDER — ASPIRIN EC 81 MG PO TBEC
81.0000 mg | DELAYED_RELEASE_TABLET | Freq: Every day | ORAL | Status: DC
  Administered 2018-11-23 – 2018-11-26 (×4): 81 mg via ORAL
  Filled 2018-11-21 (×5): qty 1

## 2018-11-21 MED ORDER — CEPHALEXIN 250 MG PO CAPS: 500.0000 mg | ORAL_CAPSULE | Freq: Once | ORAL | Status: DC

## 2018-11-21 MED ORDER — ACETAMINOPHEN 650 MG RE SUPP: 650.0000 mg | Freq: Four times a day (QID) | RECTAL | Status: DC | PRN

## 2018-11-21 MED ORDER — MUPIROCIN 2 % EX OINT
1.0000 "application " | TOPICAL_OINTMENT | Freq: Two times a day (BID) | CUTANEOUS | Status: DC
  Administered 2018-11-22 – 2018-11-26 (×9): 1 via NASAL
  Filled 2018-11-21 (×2): qty 22

## 2018-11-21 MED ORDER — ROPINIROLE HCL 1 MG PO TABS
1.0000 mg | ORAL_TABLET | Freq: Every day | ORAL | Status: DC
  Administered 2018-11-21 – 2018-11-25 (×5): 1 mg via ORAL
  Filled 2018-11-21 (×5): qty 1

## 2018-11-21 MED ORDER — ATORVASTATIN CALCIUM 80 MG PO TABS
80.0000 mg | ORAL_TABLET | Freq: Every day | ORAL | Status: DC
  Administered 2018-11-21 – 2018-11-25 (×5): 80 mg via ORAL
  Filled 2018-11-21 (×5): qty 1

## 2018-11-21 MED ORDER — CHLORHEXIDINE GLUCONATE CLOTH 2 % EX PADS
6.0000 | MEDICATED_PAD | Freq: Every day | CUTANEOUS | Status: DC
  Administered 2018-11-22 – 2018-11-26 (×3): 6 via TOPICAL

## 2018-11-21 MED ORDER — LORAZEPAM 2 MG/ML IJ SOLN
0.5000 mg | Freq: Once | INTRAMUSCULAR | Status: AC
  Administered 2018-11-21: 0.5 mg via INTRAVENOUS
  Filled 2018-11-21: qty 1

## 2018-11-21 MED ORDER — SODIUM CHLORIDE 0.9% FLUSH
3.0000 mL | Freq: Two times a day (BID) | INTRAVENOUS | Status: DC
  Administered 2018-11-21 – 2018-11-26 (×7): 3 mL via INTRAVENOUS

## 2018-11-21 NOTE — ED Triage Notes (Signed)
Patient arrived from home via [facility] reports that she fell on Wednesday at 2.30am on her way to the bathroom.   PT states she went to [place] after her fall. She said she came back because she is still hurting and also wants to know if she can stay here overnight because she has a scheduled surgery related to her stroke from last week in the morning

## 2018-11-21 NOTE — ED Provider Notes (Signed)
[facility] EMERGENCY DEPARTMENT Provider Note   CSN: [id number] Arrival date & time: 11/21/18  1314    History   Chief Complaint Chief Complaint  Patient presents with  . Left knee pain    HPI [name] is a 73 y.o. female.     HPI   73 year old female with past medical history of CHF type 2 diabetes, hyperlipidemia, hypertension, stroke presents today with complaints of left knee pain and left upper and lower extremity sensory loss.  Patient most recently seen at [facility] after transfer from [place] secondary to stroke.  Patient had slurred speech acute left-sided numbness and weakness, she was given TPA and transferred to [facility].  Upon arrival to [facility] she was noted to have cerebral infarction due to stenosis of the right carotid artery.  Patient was discharged from the hospital to home.  She notes that yesterday she suffered a fall.  She was again seen at [facility] where she had CT of her head neck and knee per patient.  She was discharged home again.  She notes she is unable to ambulate due to left-sided knee pain and swelling.  She notes this morning around 9 AM she had a sharp pain in the posterior aspect of her head, she has a similar to previous stroke.  Later on she developed decreased sensation to her left arm and left leg, and face.  She denies any infectious etiology including cough, fever, or urinary symptoms.   Past Medical History:  Diagnosis Date  . CHF (congestive heart failure) ([place])   . Diabetes mellitus without complication ([place])   . Hyperlipidemia   . Hypertension   . Stroke [facility])     Patient Active Problem List   Diagnosis Date Noted  . Fall 11/21/2018  . Carotid stenosis, right 11/18/2018  . Diabetes mellitus type II, uncontrolled ([place]) 11/18/2018  . Family hx-stroke 11/18/2018  . Cerebral infarction due to stenosis of right carotid artery ([place]) s/p tPA 11/15/2018  . Hyperlipidemia 01/14/2018  . COPD  (chronic obstructive pulmonary disease) ([place]) 01/14/2018  . Angina pectoris ([place]) 07/28/2016  . Chronic diastolic congestive heart failure ([place]) 07/28/2016  . Hypertensive heart disease with heart failure ([place]) 07/28/2016  . Diabetic polyneuropathy associated with type 2 diabetes mellitus ([place]) 11/30/2015    Past Surgical History:  Procedure Laterality Date  . ABDOMINAL HYSTERECTOMY    . APPENDECTOMY    . CARDIAC CATHETERIZATION       OB History   No obstetric history on file.      Home Medications    Prior to Admission medications   Medication Sig Start Date End Date Taking? Authorizing Provider  alendronate (FOSAMAX) 70 MG tablet Take 70 mg by mouth every Wednesday. Take with a full glass of water on an empty stomach.    [provider]  aspirin EC 81 MG EC tablet Take 1 tablet (81 mg total) by mouth daily for 21 days. 11/19/18 12/10/18  Donzetta Starch, NP  atorvastatin (LIPITOR) 80 MG tablet Take 80 mg by mouth at bedtime.     [provider]  clopidogrel (PLAVIX) 75 MG tablet Take 75 mg by mouth daily.     [provider]  clotrimazole (GYNE-LOTRIMIN) 1 % vaginal cream Place 1 Applicatorful vaginally at bedtime. 11/18/18   Donzetta Starch, NP  ezetimibe (ZETIA) 10 MG tablet Take 10 mg by mouth daily.     [provider]  gabapentin (NEURONTIN) 300 MG  capsule Take 300 mg by mouth at bedtime.     [provider]  glimepiride (AMARYL) 4 MG tablet Take 1 tablet (4 mg total) by mouth 2 (two) times daily with a meal. 11/18/18   Layne BentonBiby, Sharon L, NP  insulin glargine (LANTUS) 100 UNIT/ML injection Inject 35-40 Units into the skin See admin instructions. Inject 35 units into the skin after breakfast and 40 units at bedtime    [provider]  insulin lispro (HUMALOG) 100 UNIT/ML injection Inject 30 Units into the skin 3 (three) times daily with meals.     [provider]  lisinopril (ZESTRIL) 40 MG tablet Take 0.5 tablets (20 mg  total) by mouth daily. 11/18/18   Layne BentonBiby, Sharon L, NP  nitroGLYCERIN (NITROSTAT) 0.4 MG SL tablet Place 1 tablet under the tongue every 5 (five) minutes as needed for chest pain.     [provider]  rOPINIRole (REQUIP) 1 MG tablet Take 1 mg by mouth at bedtime.     [provider]    Family History Family History  Problem Relation Age of Onset  . Stroke Mother   . Prostate cancer Father   . CAD Sister   . CAD Sister   . Valvular heart disease Sister     Social History Social History   Tobacco Use  . Smoking status: Never Smoker  . Smokeless tobacco: Never Used  Substance Use Topics  . Alcohol use: Not Currently  . Drug use: Never     Allergies   Patient has no known allergies.   Review of Systems Review of Systems  All other systems reviewed and are negative.    Physical Exam Updated Vital Signs BP (!) 150/77 (BP Location: Right Arm)   Pulse 94   Temp 98 F (36.7 C) (Oral)   Resp 19   Ht 5\' 3"  (1.6 m)   Wt 96 kg   SpO2 98%   BMI 37.49 kg/m   Physical Exam Vitals signs and nursing note reviewed.  Constitutional:      Appearance: She is well-developed.  HENT:     Head: Normocephalic and atraumatic.  Eyes:     General: No scleral icterus.       Right eye: No discharge.        Left eye: No discharge.     Conjunctiva/sclera: Conjunctivae normal.     Pupils: Pupils are equal, round, and reactive to light.  Neck:     Musculoskeletal: Normal range of motion.     Vascular: No JVD.     Trachea: No tracheal deviation.  Pulmonary:     Effort: Pulmonary effort is normal.     Breath sounds: No stridor.  Abdominal:     General: There is no distension.     Palpations: Abdomen is soft.     Tenderness: There is no abdominal tenderness.  Musculoskeletal:     Comments: Swelling noted to left knee, no erythema-unable to range the joint secondary to pain  Neurological:     Mental Status: She is alert and oriented to person, place, and time.      Coordination: Coordination normal.     Comments: No facial asymmetry sensation intact to the face although slightly decreased on the left, sensation decreased to the left upper and lower extremities compared to the right, 4-5 left upper extremity strength  Psychiatric:        Behavior: Behavior normal.        Thought Content: Thought content normal.  Judgment: Judgment normal.      ED Treatments / Results  Labs (all labs ordered are listed, but only abnormal results are displayed) Labs Reviewed  CBC - Abnormal; Notable for the following components:      Result Value   WBC 13.9 (*)    All other components within normal limits  DIFFERENTIAL - Abnormal; Notable for the following components:   Neutro Abs 10.9 (*)    Monocytes Absolute 1.1 (*)    All other components within normal limits  COMPREHENSIVE METABOLIC PANEL - Abnormal; Notable for the following components:   Potassium 3.3 (*)    AST 13 (*)    All other components within normal limits  URINALYSIS, ROUTINE W REFLEX MICROSCOPIC - Abnormal; Notable for the following components:   APPearance CLOUDY (*)    Hgb urine dipstick LARGE (*)    Leukocytes,Ua LARGE (*)    WBC, UA >50 (*)    Bacteria, UA MANY (*)    Non Squamous Epithelial 0-5 (*)    All other components within normal limits  I-STAT CHEM 8, ED - Abnormal; Notable for the following components:   Potassium 3.3 (*)    Glucose, Bld 65 (*)    All other components within normal limits  URINE CULTURE  ETHANOL  PROTIME-INR  APTT  RAPID URINE DRUG SCREEN, HOSP PERFORMED  CBC  BASIC METABOLIC PANEL    EKG EKG Interpretation  Date/Time:  Thursday November 21 2018 13:18:28 EDT Ventricular Rate:  91 PR Interval:    QRS Duration: 96 QT Interval:  370 QTC Calculation: 456 R Axis:   -39 Text Interpretation:  Sinus arrhythmia Ventricular premature complex Left axis deviation Abnormal R-wave progression, late transition Minimal ST elevation, anterior leads No old  tracing to compare Confirmed by Eber HongMiller, Brian ([id number]) on 11/21/2018 2:35:26 PM   Radiology Mr Brain Wo Contrast  Result Date: 11/21/2018 CLINICAL DATA:  Fall.  Recent stroke. EXAM: MRI HEAD WITHOUT CONTRAST TECHNIQUE: Multiplanar, multiecho pulse sequences of the brain and surrounding structures were obtained without intravenous contrast. COMPARISON:  Head CT 11/20/2018 and MRI 11/16/2018 FINDINGS: Brain: The acute right basal ganglia infarct involving the posterior lentiform nucleus and caudate body demonstrates decreased diffusion signal abnormality compared to the prior MRI. Associated susceptibility artifact is noted related to confluent petechial blood products. There is associated cytotoxic edema without mass effect. No new infarct, new intracranial hemorrhage, midline shift, or extra-axial fluid collection is identified. T2 hyperintensities in the cerebral white matter and pons are unchanged from the prior MRI and nonspecific but compatible with mild chronic small vessel ischemic disease. Chronic infarcts are again noted in the pons and right caudate nucleus. There is mild to moderate cerebral atrophy. A partially empty sella is incidentally noted. Vascular: Major intracranial vascular flow voids are preserved. Skull and upper cervical spine: Unremarkable bone marrow signal. Sinuses/Orbits: Unremarkable orbits. Paranasal sinuses and mastoid air cells are clear. Other: None. IMPRESSION: 1. Evolving, subacute right basal ganglia infarct without extension compared to the prior MRI. 2. No new infarct or other acute intracranial abnormality. 3. Mild chronic small vessel ischemic disease. Electronically Signed   By: Sebastian AcheAllen  Grady M.D.   On: 11/21/2018 15:39    Procedures Procedures (including critical care time)  Medications Ordered in ED Medications  aspirin EC tablet 81 mg (has no administration in time range)  atorvastatin (LIPITOR) tablet 80 mg (has no administration in time range)  ezetimibe  (ZETIA) tablet 10 mg (has no administration in time range)  lisinopril (ZESTRIL)  tablet 20 mg (has no administration in time range)  clopidogrel (PLAVIX) tablet 75 mg (has no administration in time range)  gabapentin (NEURONTIN) capsule 300 mg (has no administration in time range)  rOPINIRole (REQUIP) tablet 1 mg (has no administration in time range)  enoxaparin (LOVENOX) injection 40 mg (has no administration in time range)  sodium chloride flush (NS) 0.9 % injection 3 mL (has no administration in time range)  potassium chloride SA (K-DUR) CR tablet 40 mEq (has no administration in time range)  acetaminophen (TYLENOL) tablet 650 mg (has no administration in time range)    Or  acetaminophen (TYLENOL) suppository 650 mg (has no administration in time range)  LORazepam (ATIVAN) injection 0.5 mg (0.5 mg Intravenous Given 11/21/18 1427)  HYDROcodone-acetaminophen (NORCO/VICODIN) 5-325 MG per tablet 1 tablet (1 tablet Oral Given 11/21/18 1658)     Initial Impression / Assessment and Plan / ED Course  I have reviewed the triage vital signs and the nursing notes.  Pertinent labs & imaging results that were available during my care of the patient were reviewed by me and considered in my medical decision making (see chart for details).         Assessment/Plan: 73 year old female presents today with several complaints.  Patient has left knee pain status post fall yesterday.  She is unable to ambulate secondary to discomfort.  She does have swelling noted to the left knee unable to perform laxity exam secondary to discomfort.  Currently trying to obtain CT [facility]  Since coming home she has been unable to ambulate and had to call the fire department to help get her up this morning.  She is unsafe to be discharged home.  Discussed case with case management at this time and we would not be able to obtain PT evaluation here in the ED.  Patient does have  endarterectomy scheduled for tomorrow morning given the fact that she is unable to ambulate and has a procedure tomorrow hospitalist service will be consulted for observation pending vascular procedure, PT evaluation.  Patient is also has what appears to be uncomplicated urinary tract infection.  She does have slight elevation in white count and urine consistent with urinary tract infection.  She is afebrile, she will be started on Keflex.  Urine culture pending.  Patient also had strokelike symptoms upon initial evaluation.  Discussed case with neurology, MRI results showed no acute infarct again discussed with neurology who felt no further interventions needed at this time.  Case was discussed with internal medicine who agreed for observation.     Final Clinical Impressions(s) / ED Diagnoses   Final diagnoses:  Acute pain of left knee    ED Discharge Orders    None       Rosalio LoudHedges, Vidyuth Belsito, PA-C 11/21/18 1823    Eber HongMiller, Brian, MD 11/24/18 1536

## 2018-11-21 NOTE — ED Notes (Signed)
Patient transported to MRI 

## 2018-11-21 NOTE — Anesthesia Preprocedure Evaluation (Addendum)
Anesthesia Evaluation  Patient identified by MRN, date of birth, ID band Patient awake    Reviewed: Allergy & Precautions, NPO status , Patient's Chart, lab work & pertinent test results  Airway Mallampati: II  TM Distance: >3 FB Neck ROM: Full    Dental no notable dental hx.    Pulmonary COPD,    Pulmonary exam normal breath sounds clear to auscultation       Cardiovascular hypertension, Pt. on medications + angina +CHF  Normal cardiovascular exam Rhythm:Regular Rate:Normal  TTE 2020 EF 60-65%, mild MR   Neuro/Psych CVA (6 days s/p r basal ganglia infarct with left sided deficits, received TPA) negative psych ROS   GI/Hepatic negative GI ROS, Neg liver ROS,   Endo/Other  diabetes, Insulin Dependent  Renal/GU negative Renal ROS  negative genitourinary   Musculoskeletal negative musculoskeletal ROS (+)   Abdominal   Peds negative pediatric ROS (+)  Hematology  (+) Blood dyscrasia (on plavix), ,   Anesthesia Other Findings   Reproductive/Obstetrics negative OB ROS                            Anesthesia Physical Anesthesia Plan  ASA: III  Anesthesia Plan: General   Post-op Pain Management:    Induction: Intravenous  PONV Risk Score and Plan: 3 and Ondansetron, Dexamethasone and Treatment may vary due to age or medical condition  Airway Management Planned: Oral ETT  Additional Equipment: Arterial line  Intra-op Plan:   Post-operative Plan: Extubation in OR  Informed Consent: I have reviewed the patients History and Physical, chart, labs and discussed the procedure including the risks, benefits and alternatives for the proposed anesthesia with the patient or authorized representative who has indicated his/her understanding and acceptance.     Dental advisory given  Plan Discussed with: CRNA and Surgeon  Anesthesia Plan Comments:        Anesthesia Quick Evaluation
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
## 2018-11-21 NOTE — ED Notes (Signed)
Called [facility] to request records

## 2018-11-22 ENCOUNTER — Observation Stay (HOSPITAL_COMMUNITY): Payer: Medicare Other | Admitting: Certified Registered Nurse Anesthetist

## 2018-11-22 ENCOUNTER — Encounter (HOSPITAL_COMMUNITY)
Admission: EM | Disposition: A | Discharge status: Skilled Nursing Facility | Payer: Self-pay | Source: Home / Self Care | Attending: Internal Medicine

## 2018-11-22 ENCOUNTER — Inpatient Hospital Stay (HOSPITAL_COMMUNITY): Admission: RE | Admit: 2018-11-22 | Payer: Medicare Other | Source: Home / Self Care | Admitting: Vascular Surgery

## 2018-11-22 ENCOUNTER — Encounter (HOSPITAL_COMMUNITY): Payer: Self-pay | Admitting: Radiation Oncology

## 2018-11-22 DIAGNOSIS — E1140 Type 2 diabetes mellitus with diabetic neuropathy, unspecified: Secondary | ICD-10-CM | POA: Diagnosis not present

## 2018-11-22 DIAGNOSIS — Z7401 Bed confinement status: Secondary | ICD-10-CM | POA: Diagnosis not present

## 2018-11-22 DIAGNOSIS — I6521 Occlusion and stenosis of right carotid artery: Secondary | ICD-10-CM | POA: Diagnosis not present

## 2018-11-22 DIAGNOSIS — Z9181 History of falling: Secondary | ICD-10-CM | POA: Diagnosis not present

## 2018-11-22 DIAGNOSIS — F4310 Post-traumatic stress disorder, unspecified: Secondary | ICD-10-CM | POA: Diagnosis not present

## 2018-11-22 DIAGNOSIS — Z9889 Other specified postprocedural states: Secondary | ICD-10-CM | POA: Diagnosis not present

## 2018-11-22 DIAGNOSIS — L080 Pyoderma: Secondary | ICD-10-CM | POA: Diagnosis not present

## 2018-11-22 DIAGNOSIS — W19XXXD Unspecified fall, subsequent encounter: Secondary | ICD-10-CM | POA: Diagnosis not present

## 2018-11-22 DIAGNOSIS — R531 Weakness: Secondary | ICD-10-CM | POA: Diagnosis not present

## 2018-11-22 DIAGNOSIS — E1142 Type 2 diabetes mellitus with diabetic polyneuropathy: Secondary | ICD-10-CM | POA: Diagnosis not present

## 2018-11-22 DIAGNOSIS — M25862 Other specified joint disorders, left knee: Secondary | ICD-10-CM | POA: Diagnosis not present

## 2018-11-22 DIAGNOSIS — G8194 Hemiplegia, unspecified affecting left nondominant side: Secondary | ICD-10-CM | POA: Diagnosis not present

## 2018-11-22 DIAGNOSIS — G5790 Unspecified mononeuropathy of unspecified lower limb: Secondary | ICD-10-CM | POA: Diagnosis not present

## 2018-11-22 DIAGNOSIS — Z90710 Acquired absence of both cervix and uterus: Secondary | ICD-10-CM | POA: Diagnosis not present

## 2018-11-22 DIAGNOSIS — E119 Type 2 diabetes mellitus without complications: Secondary | ICD-10-CM | POA: Diagnosis not present

## 2018-11-22 DIAGNOSIS — D649 Anemia, unspecified: Secondary | ICD-10-CM | POA: Diagnosis not present

## 2018-11-22 DIAGNOSIS — I679 Cerebrovascular disease, unspecified: Secondary | ICD-10-CM | POA: Diagnosis not present

## 2018-11-22 DIAGNOSIS — Z8673 Personal history of transient ischemic attack (TIA), and cerebral infarction without residual deficits: Secondary | ICD-10-CM

## 2018-11-22 DIAGNOSIS — I739 Peripheral vascular disease, unspecified: Secondary | ICD-10-CM | POA: Diagnosis not present

## 2018-11-22 DIAGNOSIS — M25469 Effusion, unspecified knee: Secondary | ICD-10-CM | POA: Diagnosis not present

## 2018-11-22 DIAGNOSIS — G2581 Restless legs syndrome: Secondary | ICD-10-CM | POA: Diagnosis not present

## 2018-11-22 DIAGNOSIS — Z48812 Encounter for surgical aftercare following surgery on the circulatory system: Secondary | ICD-10-CM | POA: Diagnosis not present

## 2018-11-22 DIAGNOSIS — Z1159 Encounter for screening for other viral diseases: Secondary | ICD-10-CM | POA: Diagnosis not present

## 2018-11-22 DIAGNOSIS — I10 Essential (primary) hypertension: Secondary | ICD-10-CM | POA: Diagnosis not present

## 2018-11-22 DIAGNOSIS — F1010 Alcohol abuse, uncomplicated: Secondary | ICD-10-CM | POA: Diagnosis present

## 2018-11-22 DIAGNOSIS — J449 Chronic obstructive pulmonary disease, unspecified: Secondary | ICD-10-CM | POA: Diagnosis not present

## 2018-11-22 DIAGNOSIS — E785 Hyperlipidemia, unspecified: Secondary | ICD-10-CM

## 2018-11-22 DIAGNOSIS — Z79899 Other long term (current) drug therapy: Secondary | ICD-10-CM | POA: Diagnosis not present

## 2018-11-22 DIAGNOSIS — Z7984 Long term (current) use of oral hypoglycemic drugs: Secondary | ICD-10-CM | POA: Diagnosis not present

## 2018-11-22 DIAGNOSIS — E1165 Type 2 diabetes mellitus with hyperglycemia: Secondary | ICD-10-CM | POA: Diagnosis not present

## 2018-11-22 DIAGNOSIS — Z794 Long term (current) use of insulin: Secondary | ICD-10-CM | POA: Diagnosis not present

## 2018-11-22 DIAGNOSIS — I639 Cerebral infarction, unspecified: Secondary | ICD-10-CM | POA: Diagnosis not present

## 2018-11-22 DIAGNOSIS — Z7982 Long term (current) use of aspirin: Secondary | ICD-10-CM

## 2018-11-22 DIAGNOSIS — Z7902 Long term (current) use of antithrombotics/antiplatelets: Secondary | ICD-10-CM | POA: Diagnosis not present

## 2018-11-22 DIAGNOSIS — Z741 Need for assistance with personal care: Secondary | ICD-10-CM | POA: Diagnosis not present

## 2018-11-22 DIAGNOSIS — I63231 Cerebral infarction due to unspecified occlusion or stenosis of right carotid arteries: Secondary | ICD-10-CM | POA: Diagnosis not present

## 2018-11-22 DIAGNOSIS — I5032 Chronic diastolic (congestive) heart failure: Secondary | ICD-10-CM | POA: Diagnosis not present

## 2018-11-22 DIAGNOSIS — M25562 Pain in left knee: Secondary | ICD-10-CM | POA: Diagnosis not present

## 2018-11-22 DIAGNOSIS — I2510 Atherosclerotic heart disease of native coronary artery without angina pectoris: Secondary | ICD-10-CM | POA: Diagnosis not present

## 2018-11-22 DIAGNOSIS — G8190 Hemiplegia, unspecified affecting unspecified side: Secondary | ICD-10-CM | POA: Diagnosis not present

## 2018-11-22 DIAGNOSIS — Z87440 Personal history of urinary (tract) infections: Secondary | ICD-10-CM | POA: Diagnosis not present

## 2018-11-22 DIAGNOSIS — I69354 Hemiplegia and hemiparesis following cerebral infarction affecting left non-dominant side: Secondary | ICD-10-CM | POA: Diagnosis not present

## 2018-11-22 DIAGNOSIS — M25462 Effusion, left knee: Secondary | ICD-10-CM | POA: Diagnosis not present

## 2018-11-22 DIAGNOSIS — I959 Hypotension, unspecified: Secondary | ICD-10-CM | POA: Diagnosis not present

## 2018-11-22 DIAGNOSIS — I509 Heart failure, unspecified: Secondary | ICD-10-CM

## 2018-11-22 DIAGNOSIS — I110 Hypertensive heart disease with heart failure: Secondary | ICD-10-CM

## 2018-11-22 DIAGNOSIS — M810 Age-related osteoporosis without current pathological fracture: Secondary | ICD-10-CM | POA: Diagnosis not present

## 2018-11-22 DIAGNOSIS — R262 Difficulty in walking, not elsewhere classified: Secondary | ICD-10-CM | POA: Diagnosis not present

## 2018-11-22 DIAGNOSIS — M2550 Pain in unspecified joint: Secondary | ICD-10-CM | POA: Diagnosis not present

## 2018-11-22 HISTORY — PX: KNEE ARTHROCENTESIS: SUR44

## 2018-11-22 HISTORY — PX: PATCH ANGIOPLASTY: SHX6230

## 2018-11-22 HISTORY — PX: ENDARTERECTOMY: SHX5162

## 2018-11-22 LAB — TYPE AND SCREEN
ABO/RH(D): A POS
Antibody Screen: NEGATIVE

## 2018-11-22 LAB — BASIC METABOLIC PANEL
Anion gap: 7 (ref 5–15)
BUN: 11 mg/dL (ref 8–23)
CO2: 25 mmol/L (ref 22–32)
Calcium: 8.5 mg/dL — ABNORMAL LOW (ref 8.9–10.3)
Chloride: 107 mmol/L (ref 98–111)
Creatinine, Ser: 0.64 mg/dL (ref 0.44–1.00)
GFR calc Af Amer: >60 mL/min (ref >60)
GFR calc non Af Amer: >60 mL/min (ref >60)
Glucose, Bld: 190 mg/dL — ABNORMAL HIGH (ref 70–99)
Potassium: 3.6 mmol/L (ref 3.5–5.1)
Sodium: 139 mmol/L (ref 135–145)

## 2018-11-22 LAB — CBC
HCT: 36.6 % (ref 36.0–46.0)
Hemoglobin: 12.3 g/dL (ref 12.0–15.0)
MCH: 29.8 pg (ref 26.0–34.0)
MCHC: 33.6 g/dL (ref 30.0–36.0)
MCV: 88.6 fL (ref 80.0–100.0)
Platelets: 173 10*3/uL (ref 150–400)
RBC: 4.13 MIL/uL (ref 3.87–5.11)
RDW: 13.4 % (ref 11.5–15.5)
WBC: 12.2 10*3/uL — ABNORMAL HIGH (ref 4.0–10.5)
nRBC: 0 % (ref 0.0–0.2)

## 2018-11-22 LAB — GLUCOSE, CAPILLARY
Glucose-Capillary: 136 mg/dL — ABNORMAL HIGH (ref 70–99)
Glucose-Capillary: 152 mg/dL — ABNORMAL HIGH (ref 70–99)
Glucose-Capillary: 155 mg/dL — ABNORMAL HIGH (ref 70–99)
Glucose-Capillary: 157 mg/dL — ABNORMAL HIGH (ref 70–99)
Glucose-Capillary: 203 mg/dL — ABNORMAL HIGH (ref 70–99)
Glucose-Capillary: 217 mg/dL — ABNORMAL HIGH (ref 70–99)
Glucose-Capillary: 312 mg/dL — ABNORMAL HIGH (ref 70–99)
Glucose-Capillary: 392 mg/dL — ABNORMAL HIGH (ref 70–99)

## 2018-11-22 LAB — GRAM STAIN

## 2018-11-22 LAB — URINE CULTURE
Culture: >=100000 — AB
Special Requests: NORMAL

## 2018-11-22 LAB — SYNOVIAL CELL COUNT + DIFF, W/ CRYSTALS
Lymphocytes-Synovial Fld: 2 % (ref 0–20)
Monocyte-Macrophage-Synovial Fluid: 1 % — ABNORMAL LOW (ref 50–90)
Neutrophil, Synovial: 97 % — ABNORMAL HIGH (ref 0–25)
WBC, Synovial: 6450 /mm3 — ABNORMAL HIGH (ref 0–200)

## 2018-11-22 LAB — ABO/RH: ABO/RH(D): A POS

## 2018-11-22 SURGERY — ENDARTERECTOMY, CAROTID
Anesthesia: General | Site: Neck | Laterality: Right

## 2018-11-22 MED ORDER — SODIUM CHLORIDE 0.9 % IV SOLN
INTRAVENOUS | Status: AC
  Filled 2018-11-22: qty 1.2

## 2018-11-22 MED ORDER — SUGAMMADEX SODIUM 200 MG/2ML IV SOLN
INTRAVENOUS | Status: DC | PRN
  Administered 2018-11-22: 200 mg via INTRAVENOUS

## 2018-11-22 MED ORDER — HYDRALAZINE HCL 20 MG/ML IJ SOLN: 5.0000 mg | INTRAMUSCULAR | Status: DC | PRN

## 2018-11-22 MED ORDER — PROTAMINE SULFATE 10 MG/ML IV SOLN
INTRAVENOUS | Status: DC | PRN
  Administered 2018-11-22: 20 mg via INTRAVENOUS
  Administered 2018-11-22: 10 mg via INTRAVENOUS
  Administered 2018-11-22: 20 mg via INTRAVENOUS

## 2018-11-22 MED ORDER — PROTAMINE SULFATE 10 MG/ML IV SOLN
INTRAVENOUS | Status: AC
  Filled 2018-11-22: qty 10

## 2018-11-22 MED ORDER — ALUM & MAG HYDROXIDE-SIMETH 200-200-20 MG/5ML PO SUSP
15.0000 mL | ORAL | Status: DC | PRN
  Filled 2018-11-22: qty 30

## 2018-11-22 MED ORDER — CEFAZOLIN SODIUM-DEXTROSE 2-3 GM-%(50ML) IV SOLR
INTRAVENOUS | Status: DC | PRN
  Administered 2018-11-22: 2 g via INTRAVENOUS

## 2018-11-22 MED ORDER — INSULIN GLARGINE 100 UNIT/ML ~~LOC~~ SOLN
10.0000 [IU] | Freq: Every day | SUBCUTANEOUS | Status: DC
  Filled 2018-11-22 (×3): qty 0.1

## 2018-11-22 MED ORDER — CEFAZOLIN SODIUM-DEXTROSE 2-4 GM/100ML-% IV SOLN
2.0000 g | Freq: Three times a day (TID) | INTRAVENOUS | Status: AC
  Administered 2018-11-22 – 2018-11-23 (×2): 2 g via INTRAVENOUS
  Filled 2018-11-22 (×2): qty 100

## 2018-11-22 MED ORDER — PROMETHAZINE HCL 25 MG/ML IJ SOLN: 6.2500 mg | INTRAMUSCULAR | Status: DC | PRN

## 2018-11-22 MED ORDER — VANCOMYCIN HCL 1000 MG IV SOLR
INTRAVENOUS | Status: DC | PRN
  Administered 2018-11-22 (×2): 1000 mg via INTRAVENOUS

## 2018-11-22 MED ORDER — HYDROMORPHONE HCL 1 MG/ML IJ SOLN
0.2500 mg | INTRAMUSCULAR | Status: DC | PRN
  Administered 2018-11-22: 0.5 mg via INTRAVENOUS

## 2018-11-22 MED ORDER — HYDROMORPHONE HCL 1 MG/ML IJ SOLN
INTRAMUSCULAR | Status: AC
  Filled 2018-11-22: qty 1

## 2018-11-22 MED ORDER — METOPROLOL TARTRATE 5 MG/5ML IV SOLN: 2.0000 mg | INTRAVENOUS | Status: DC | PRN

## 2018-11-22 MED ORDER — SODIUM CHLORIDE 0.9 % IV SOLN
INTRAVENOUS | Status: DC | PRN
  Administered 2018-11-22 (×3): via INTRAVENOUS

## 2018-11-22 MED ORDER — PHENYLEPHRINE 40 MCG/ML (10ML) SYRINGE FOR IV PUSH (FOR BLOOD PRESSURE SUPPORT)
PREFILLED_SYRINGE | INTRAVENOUS | Status: DC | PRN
  Administered 2018-11-22 (×2): 40 ug via INTRAVENOUS
  Administered 2018-11-22 (×2): 80 ug via INTRAVENOUS
  Administered 2018-11-22: 120 ug via INTRAVENOUS
  Administered 2018-11-22: 40 ug via INTRAVENOUS

## 2018-11-22 MED ORDER — ENOXAPARIN SODIUM 40 MG/0.4ML ~~LOC~~ SOLN
40.0000 mg | SUBCUTANEOUS | Status: DC
  Administered 2018-11-23 – 2018-11-26 (×4): 40 mg via SUBCUTANEOUS
  Filled 2018-11-22 (×4): qty 0.4

## 2018-11-22 MED ORDER — ONDANSETRON HCL 4 MG/2ML IJ SOLN: 4.0000 mg | Freq: Four times a day (QID) | INTRAMUSCULAR | Status: DC | PRN

## 2018-11-22 MED ORDER — MORPHINE SULFATE (PF) 2 MG/ML IV SOLN: 2.0000 mg | INTRAVENOUS | Status: DC | PRN

## 2018-11-22 MED ORDER — PROPOFOL 10 MG/ML IV BOLUS
INTRAVENOUS | Status: AC
  Filled 2018-11-22: qty 20

## 2018-11-22 MED ORDER — PROPOFOL 10 MG/ML IV BOLUS
INTRAVENOUS | Status: DC | PRN
  Administered 2018-11-22: 100 mg via INTRAVENOUS
  Administered 2018-11-22: 40 mg via INTRAVENOUS

## 2018-11-22 MED ORDER — FENTANYL CITRATE (PF) 250 MCG/5ML IJ SOLN
INTRAMUSCULAR | Status: DC | PRN
  Administered 2018-11-22 (×3): 50 ug via INTRAVENOUS

## 2018-11-22 MED ORDER — MORPHINE SULFATE (PF) 2 MG/ML IV SOLN: 2.0000 mg | Freq: Four times a day (QID) | INTRAVENOUS | Status: DC | PRN

## 2018-11-22 MED ORDER — LABETALOL HCL 5 MG/ML IV SOLN: 10.0000 mg | INTRAVENOUS | Status: DC | PRN

## 2018-11-22 MED ORDER — SODIUM CHLORIDE 0.9 % IV SOLN: 500.0000 mL | Freq: Once | INTRAVENOUS | Status: DC | PRN

## 2018-11-22 MED ORDER — DEXAMETHASONE SODIUM PHOSPHATE 10 MG/ML IJ SOLN
INTRAMUSCULAR | Status: DC | PRN
  Administered 2018-11-22: 5 mg via INTRAVENOUS

## 2018-11-22 MED ORDER — LIDOCAINE HCL (PF) 1 % IJ SOLN
INTRAMUSCULAR | Status: AC
  Filled 2018-11-22: qty 5

## 2018-11-22 MED ORDER — VANCOMYCIN HCL IN DEXTROSE 1-5 GM/200ML-% IV SOLN
INTRAVENOUS | Status: AC
  Filled 2018-11-22: qty 200

## 2018-11-22 MED ORDER — HEMOSTATIC AGENTS (NO CHARGE) OPTIME
TOPICAL | Status: DC | PRN
  Administered 2018-11-22: 1 via TOPICAL

## 2018-11-22 MED ORDER — ONDANSETRON HCL 4 MG/2ML IJ SOLN
INTRAMUSCULAR | Status: DC | PRN
  Administered 2018-11-22: 4 mg via INTRAVENOUS

## 2018-11-22 MED ORDER — SODIUM CHLORIDE 0.9 % IV SOLN
INTRAVENOUS | Status: DC | PRN
  Administered 2018-11-22: 08:00:00 15 ug/min via INTRAVENOUS

## 2018-11-22 MED ORDER — HYDROCODONE-ACETAMINOPHEN 5-325 MG PO TABS
1.0000 | ORAL_TABLET | ORAL | Status: DC | PRN
  Administered 2018-11-22: 1 via ORAL
  Filled 2018-11-22: qty 1

## 2018-11-22 MED ORDER — DOCUSATE SODIUM 100 MG PO CAPS
100.0000 mg | ORAL_CAPSULE | Freq: Every day | ORAL | Status: DC
  Administered 2018-11-23 – 2018-11-26 (×3): 100 mg via ORAL
  Filled 2018-11-22 (×4): qty 1

## 2018-11-22 MED ORDER — CEFAZOLIN SODIUM-DEXTROSE 2-4 GM/100ML-% IV SOLN
INTRAVENOUS | Status: AC
  Filled 2018-11-22: qty 100

## 2018-11-22 MED ORDER — ROCURONIUM BROMIDE 10 MG/ML (PF) SYRINGE
PREFILLED_SYRINGE | INTRAVENOUS | Status: DC | PRN
  Administered 2018-11-22: 70 mg via INTRAVENOUS

## 2018-11-22 MED ORDER — HEPARIN SODIUM (PORCINE) 1000 UNIT/ML IJ SOLN
INTRAMUSCULAR | Status: DC | PRN
  Administered 2018-11-22: 9000 [IU] via INTRAVENOUS

## 2018-11-22 MED ORDER — SODIUM CHLORIDE 0.9 % IV BOLUS
500.0000 mL | Freq: Once | INTRAVENOUS | Status: AC | PRN
  Administered 2018-11-24: 500 mL via INTRAVENOUS

## 2018-11-22 MED ORDER — DOPAMINE-DEXTROSE 3.2-5 MG/ML-% IV SOLN
0.0000 ug/kg/min | INTRAVENOUS | Status: DC
  Administered 2018-11-22: 5 ug/kg/min via INTRAVENOUS
  Filled 2018-11-22: qty 250

## 2018-11-22 MED ORDER — HEPARIN SODIUM (PORCINE) 1000 UNIT/ML IJ SOLN
INTRAMUSCULAR | Status: AC
  Filled 2018-11-22: qty 1

## 2018-11-22 MED ORDER — SODIUM CHLORIDE 0.9 % IV SOLN
INTRAVENOUS | Status: AC
  Administered 2018-11-22: 12:00:00 via INTRAVENOUS

## 2018-11-22 MED ORDER — FENTANYL CITRATE (PF) 250 MCG/5ML IJ SOLN
INTRAMUSCULAR | Status: AC
  Filled 2018-11-22: qty 5

## 2018-11-22 MED ORDER — ACETAMINOPHEN 325 MG PO TABS
650.0000 mg | ORAL_TABLET | Freq: Four times a day (QID) | ORAL | Status: DC
  Administered 2018-11-22 – 2018-11-26 (×13): 650 mg via ORAL
  Filled 2018-11-22 (×15): qty 2

## 2018-11-22 MED ORDER — 0.9 % SODIUM CHLORIDE (POUR BTL) OPTIME
TOPICAL | Status: DC | PRN
  Administered 2018-11-22: 07:00:00 2000 mL

## 2018-11-22 MED ORDER — SODIUM CHLORIDE 0.9 % IV SOLN
INTRAVENOUS | Status: DC | PRN
  Administered 2018-11-22: 500 mL

## 2018-11-22 SURGICAL SUPPLY — 57 items
ADH SKN CLS APL DERMABOND .7 (GAUZE/BANDAGES/DRESSINGS) ×1
ADPR TBG 2 MALE LL ART (MISCELLANEOUS)
CANISTER SUCT 3000ML PPV (MISCELLANEOUS) ×3 IMPLANT
CATH ROBINSON RED A/P 18FR (CATHETERS) ×3 IMPLANT
CLIP VESOCCLUDE MED 24/CT (CLIP) ×3 IMPLANT
CLIP VESOCCLUDE SM WIDE 24/CT (CLIP) ×3 IMPLANT
COVER PROBE W GEL 5X96 (DRAPES) ×2 IMPLANT
COVER TRANSDUCER ULTRASND GEL (DRAPE) ×3 IMPLANT
COVER WAND RF STERILE (DRAPES) ×3 IMPLANT
DERMABOND ADVANCED (GAUZE/BANDAGES/DRESSINGS) ×2
DERMABOND ADVANCED .7 DNX12 (GAUZE/BANDAGES/DRESSINGS) ×1 IMPLANT
DRAIN CHANNEL 15F RND FF W/TCR (WOUND CARE) IMPLANT
ELECT REM PT RETURN 9FT ADLT (ELECTROSURGICAL) ×3
ELECTRODE REM PT RTRN 9FT ADLT (ELECTROSURGICAL) ×1 IMPLANT
EVACUATOR SILICONE 100CC (DRAIN) IMPLANT
GLOVE BIO SURGEON STRL SZ7.5 (GLOVE) ×3 IMPLANT
GLOVE BIOGEL PI IND STRL 8 (GLOVE) ×1 IMPLANT
GLOVE BIOGEL PI INDICATOR 8 (GLOVE) ×2
GOWN STRL REUS W/ TWL LRG LVL3 (GOWN DISPOSABLE) ×2 IMPLANT
GOWN STRL REUS W/ TWL XL LVL3 (GOWN DISPOSABLE) ×2 IMPLANT
GOWN STRL REUS W/TWL LRG LVL3 (GOWN DISPOSABLE) ×6
GOWN STRL REUS W/TWL XL LVL3 (GOWN DISPOSABLE) ×6
HEMOSTAT SNOW SURGICEL 2X4 (HEMOSTASIS) ×2 IMPLANT
IV ADAPTER SYR DOUBLE MALE LL (MISCELLANEOUS) IMPLANT
KIT BASIN OR (CUSTOM PROCEDURE TRAY) ×3 IMPLANT
KIT SHUNT ARGYLE CAROTID ART 6 (VASCULAR PRODUCTS) IMPLANT
KIT TURNOVER KIT B (KITS) ×3 IMPLANT
LOOP VESSEL MINI RED (MISCELLANEOUS) IMPLANT
NDL HYPO 25GX1X1/2 BEV (NEEDLE) IMPLANT
NDL SPNL 20GX3.5 QUINCKE YW (NEEDLE) IMPLANT
NEEDLE HYPO 25GX1X1/2 BEV (NEEDLE) IMPLANT
NEEDLE SPNL 20GX3.5 QUINCKE YW (NEEDLE) IMPLANT
NS IRRIG 1000ML POUR BTL (IV SOLUTION) ×9 IMPLANT
PACK CAROTID (CUSTOM PROCEDURE TRAY) ×3 IMPLANT
PAD ARMBOARD 7.5X6 YLW CONV (MISCELLANEOUS) ×6 IMPLANT
PATCH VASC XENOSURE 1CMX6CM (Vascular Products) ×3 IMPLANT
PATCH VASC XENOSURE 1X6 (Vascular Products) IMPLANT
POSITIONER HEAD DONUT 9IN (MISCELLANEOUS) ×3 IMPLANT
SHUNT CAROTID BYPASS 10 (VASCULAR PRODUCTS) ×2 IMPLANT
SHUNT CAROTID BYPASS 12FRX15.5 (VASCULAR PRODUCTS) IMPLANT
SPONGE SURGIFOAM ABS GEL 100 (HEMOSTASIS) IMPLANT
STOPCOCK 4 WAY LG BORE MALE ST (IV SETS) IMPLANT
SUT ETHILON 3 0 PS 1 (SUTURE) IMPLANT
SUT MNCRL AB 4-0 PS2 18 (SUTURE) ×3 IMPLANT
SUT PROLENE 5 0 C 1 24 (SUTURE) ×3 IMPLANT
SUT PROLENE 6 0 BV (SUTURE) ×9 IMPLANT
SUT PROLENE 7 0 BV1 MDA (SUTURE) ×2 IMPLANT
SUT SILK 3 0 (SUTURE)
SUT SILK 3-0 18XBRD TIE 12 (SUTURE) IMPLANT
SUT VIC AB 2-0 CT1 27 (SUTURE) ×3
SUT VIC AB 2-0 CT1 TAPERPNT 27 (SUTURE) ×1 IMPLANT
SUT VIC AB 3-0 SH 27 (SUTURE) ×3
SUT VIC AB 3-0 SH 27X BRD (SUTURE) ×1 IMPLANT
SYR CONTROL 10ML LL (SYRINGE) IMPLANT
TOWEL GREEN STERILE (TOWEL DISPOSABLE) ×3 IMPLANT
TUBING ART PRESS 48 MALE/FEM (TUBING) IMPLANT
WATER STERILE IRR 1000ML POUR (IV SOLUTION) ×3 IMPLANT

## 2018-11-22 NOTE — Anesthesia Postprocedure Evaluation (Signed)
Anesthesia Post Note  Patient: Ellie Bryand Lenahan  Procedure(s) Performed: ENDARTERECTOMY CAROTID RIGHT (Right Neck) Patch Angioplasty Right Carotid Artery using Xenosure Biologic Patch (Right Neck)     Patient location during evaluation: PACU Anesthesia Type: General Level of consciousness: awake and alert Pain management: pain level controlled Vital Signs Assessment: post-procedure vital signs reviewed and stable Respiratory status: spontaneous breathing, nonlabored ventilation, respiratory function stable and patient connected to nasal cannula oxygen Cardiovascular status: blood pressure returned to baseline and stable Postop Assessment: no apparent nausea or vomiting Anesthetic complications: no    Last Vitals:  Vitals:   11/22/18 1025 11/22/18 1040  BP: 129/61 123/60  Pulse: 82 87  Resp: (!) 21 20  Temp:    SpO2: 100% 91%    Last Pain:  Vitals:   11/22/18 1020  TempSrc:   PainSc: 5     LLE Motor Response: Purposeful movement;Responds to commands (11/22/18 1040)   RLE Motor Response: Purposeful movement;Responds to commands (11/22/18 1040)        Ilyse Tremain S

## 2018-11-22 NOTE — Transfer of Care (Signed)
Immediate Anesthesia Transfer of Care Note  Patient: [name]  Procedure(s) Performed: ENDARTERECTOMY CAROTID RIGHT (Right Neck) Patch Angioplasty Right Carotid Artery using Xenosure Biologic Patch (Right Neck)  Patient Location: PACU  Anesthesia Type:General  Level of Consciousness: awake, alert  and oriented  Airway & Oxygen Therapy: Patient Spontanous Breathing and Patient connected to nasal cannula oxygen  Post-op Assessment: Report given to RN, Post -op Vital signs reviewed and stable and Patient moving all extremities X 4  Post vital signs: Reviewed and stable  Last Vitals:  Vitals Value Taken Time  BP 136/66 11/22/18 1008  Temp    Pulse 86 11/22/18 1012  Resp 25 11/22/18 1012  SpO2 98 % 11/22/18 1012  Vitals shown include unvalidated device data.  Last Pain:  Vitals:   11/22/18 0532  TempSrc: Oral  PainSc:       Patients Stated Pain Goal: 2 (76/19/50 9326)  Complications: No apparent anesthesia complications
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
## 2018-11-22 NOTE — Plan of Care (Signed)
Continue to monitor

## 2018-11-22 NOTE — Progress Notes (Signed)
PT Cancellation Note  Patient Details Name: [name] MRN: [id number] DOB: 07/18/1945   Cancelled Treatment:    Reason Eval/Treat Not Completed: Pain limiting ability to participate Pt reporting increased pain in L knee and requesting to wait until she gets pain medications. Will follow up as schedule allows.   Leighton Ruff, PT, DPT  Acute Rehabilitation Services  Pager: [phone] Office: [phone]    Rudean Hitt 11/22/2018, 2:16 PM

## 2018-11-22 NOTE — Progress Notes (Signed)
S/P R CEA this am.  Neck looks good.  No hematoma.  CN II-XII intact.  Left arm/hand remains weak - strength 4/5 consistent with preop exam this morning.  Moving all extremities otherwise with no new focal deficits that I appreciate.  Marty Heck, MD [facility] Office: [phone] Pager: [place]

## 2018-11-22 NOTE — Anesthesia Procedure Notes (Signed)
Procedure Name: Intubation Date/Time: 11/22/2018 8:02 AM Performed by: Mariea Clonts, CRNA Pre-anesthesia Checklist: Patient identified, Emergency Drugs available, Suction available and Patient being monitored Patient Re-evaluated:Patient Re-evaluated prior to induction Oxygen Delivery Method: Circle System Utilized Preoxygenation: Pre-oxygenation with 100% oxygen Induction Type: IV induction Ventilation: Mask ventilation without difficulty Laryngoscope Size: Miller and 2 Grade View: Grade I Tube type: Oral Tube size: 7.0 mm Number of attempts: 1 Airway Equipment and Method: Stylet and Oral airway Placement Confirmation: ETT inserted through vocal cords under direct vision,  positive ETCO2 and breath sounds checked- equal and bilateral Secured at: 21 cm Tube secured with: Tape Dental Injury: Teeth and Oropharynx as per pre-operative assessment

## 2018-11-22 NOTE — Significant Event (Signed)
Rapid Response Event Note  Overview: Time Called: 1705 Arrival Time: 1820 Event Type: Hypotension  Initial Focused Assessment: Right CEA this am Incision with small amount of swelling, slightly bruised but soft Post op VS 130s/60s  HR 80s Per Patient and staff she was OOB to chair when she complained of feeling dizzy.  BP 86/72 -94/48  HR 80-70s.   Interventions: Spoke with RN via phone. 500cc NS bolus given BP 94/48   RN spoke with Dr Carlis Abbott via phone.  Orders received for Dopamine at 68mcg/kg/min Patient is fully alert eating dinner. About 20-30 minutes later BP 180/86  HR 133 Dopamine stopped. BP  80-90/40-50  HR 80-90s RN to call MD for further orders.    Plan of Care (if not transferred): RN to call if assistance needed.  Event Summary: Name of Physician Notified: Dr Carlis Abbott at      at    Outcome: Stayed in room and stabalized  Event End Time: [place]  Raliegh Ip

## 2018-11-22 NOTE — H&P (Signed)
History and Physical Interval Note:  11/22/2018 7:23 AM  [name]  has presented today for surgery, with the diagnosis of right carotid stenosis.  The various methods of treatment have been discussed with the patient and family. After consideration of risks, benefits and other options for treatment, the patient has consented to  Procedure(s): ENDARTERECTOMY CAROTID (Right) as a surgical intervention.  The patient's history has been reviewed, patient examined, no change in status, stable for surgery.  I have reviewed the patient's chart and labs.  Questions were answered to the patient's satisfaction.    Plan for right carotid endarterectomy.  Symptomatic high grade stenosis.  Admitted with fall at home.  No new neurologic symptoms per patient.  MRI shows stroke stable.  No new acute findings.  Continues to have left arm weakness. States difficult to move left leg - pain after fall - doesn't feel its weak.   [employer]    Reason for Consult:  Symptomatic right ICA stenosis Referring Physician:  Dr. Erlinda Hong, neurology MRN #:  [id number]  History of Present Illness: This is a 73 y.o. female with history of diabetes, lower extremity neuropathy, and CHF that vascular surgery has been consulted for symptomatic right ICA stenosis.  Patient states she presented on Friday to [place] with slurred speech as well as left arm weakness and tingling.  She states she did receive TPA prior to transfer.  She states all of her symptoms resolved except for she does have some ongoing weakness in the left hand.  She states she has had at least 7 strokes in the past.  Per her report her last stroke was at least 10 years ago and was related to clot in the base of her brain.  She denies any previous carotid intervention.  She has never had any neck surgery.  She is on Plavix.  No hx of arrhythmia.  CTA neck done at [facility] prior to transfer did show a greater than 70% stenosis of the right  ICA with only 30% stenosis on the left.  Her MRI here showed small infarcts in the right basal ganglia with some petechial hemorrhage.      Past Medical History:  Diagnosis Date  . CHF (congestive heart failure) ([place])   . Diabetes mellitus without complication ([place])   . Hyperlipidemia   . Hypertension   . Stroke [facility])          Past Surgical History:  Procedure Laterality Date  . ABDOMINAL HYSTERECTOMY    . APPENDECTOMY    . CARDIAC CATHETERIZATION      No Known Allergies         Prior to Admission medications   Medication Sig Start Date End Date Taking? Authorizing Provider  alendronate (FOSAMAX) 70 MG tablet Take 70 mg by mouth every Wednesday. Take with a full glass of water on an empty stomach.   Yes [provider]  atorvastatin (LIPITOR) 80 MG tablet Take 80 mg by mouth at bedtime.    Yes [provider]  clopidogrel (PLAVIX) 75 MG tablet Take 75 mg by mouth daily.    Yes [provider]  ezetimibe (ZETIA) 10 MG tablet Take 10 mg by mouth daily.    Yes [provider]  gabapentin (NEURONTIN) 300 MG capsule Take 300 mg by mouth at bedtime.    Yes [provider]  hydrALAZINE (APRESOLINE) 25 MG tablet Take 25 mg by mouth 3 (three) times daily.    Yes [provider]  insulin glargine (LANTUS) 100 UNIT/ML injection Inject 35-40 Units into the skin See admin instructions. Inject 35 units into the skin after breakfast and 40 units at bedtime   Yes [provider]  insulin lispro (HUMALOG) 100 UNIT/ML injection Inject 30 Units into the skin 3 (three) times daily with meals.    Yes [provider]  lisinopril (PRINIVIL,ZESTRIL) 40 MG tablet Take 40 mg by mouth daily.    Yes [provider]  nitroGLYCERIN (NITROSTAT) 0.4 MG SL tablet Place 1 tablet under the tongue every 5 (five) minutes as needed for chest pain.    Yes [provider]  olmesartan (BENICAR) 40  MG tablet Take 40 mg by mouth daily.    Yes [provider]  rOPINIRole (REQUIP) 1 MG tablet Take 1 mg by mouth at bedtime.    Yes [provider]  torsemide (DEMADEX) 100 MG tablet Take 100 mg by mouth See admin instructions. Take 100 mg by mouth up to 3 times a week as needed or fluid or edema   Yes [provider]    Social History        Socioeconomic History  . Marital status: Married    Spouse name: Not on file  . Number of children: Not on file  . Years of education: Not on file  . Highest education level: Not on file  Occupational History  . Not on file  Social Needs  . Financial resource strain: Not on file  . Food insecurity    Worry: Not on file    Inability: Not on file  . Transportation needs    Medical: Not on file    Non-medical: Not on file  Tobacco Use  . Smoking status: Never Smoker  . Smokeless tobacco: Never Used  Substance and Sexual Activity  . Alcohol use: Not Currently  . Drug use: Never  . Sexual activity: Not on file  Lifestyle  . Physical activity    Days per week: Not on file    Minutes per session: Not on file  . Stress: Not on file  Relationships  . Social Musicianconnections    Talks on phone: Not on file    Gets together: Not on file    Attends religious service: Not on file    Active member of club or organization: Not on file    Attends meetings of clubs or organizations: Not on file    Relationship status: Not on file  . Intimate partner violence    Fear of current or ex partner: Not on file    Emotionally abused: Not on file    Physically abused: Not on file    Forced sexual activity: Not on file  Other Topics Concern  . Not on file  Social History Narrative  . Not on file          Family History  Problem Relation Age of Onset  . Stroke Mother   . Prostate cancer Father   . CAD Sister   . CAD Sister   . Valvular heart disease Sister     ROS: [x]   Positive   [ ]  Negative   [ ]  All sytems reviewed and are negative  Cardiovascular: []  chest pain/pressure []  palpitations []  SOB lying flat []  DOE []  pain in legs while walking []  pain in legs at rest []  pain in legs at night []  non-healing ulcers []  hx of DVT []  swelling in legs  Pulmonary: []  productive cough []   asthma/wheezing []  home O2  Neurologic: [x]  weakness in [x]  arms []  legs (left) [x]  numbness in [x]  arms []  legs (left) []  hx of CVA []  mini stroke [] difficulty speaking or slurred speech []  temporary loss of vision in one eye []  dizziness  Hematologic: []  hx of cancer []  bleeding problems []  problems with blood clotting easily  Endocrine:   []  diabetes []  thyroid disease  GI []  vomiting blood []  blood in stool  GU: []  CKD/renal failure []  HD--[]  M/W/F or []  T/T/S []  burning with urination []  blood in urine  Psychiatric: []  anxiety []  depression  Musculoskeletal: []  arthritis []  joint pain  Integumentary: []  rashes []  ulcers  Constitutional: []  fever []  chills   Physical Examination      Vitals:   11/18/18 0330 11/18/18 0817  BP: 128/71 129/82  Pulse: 63 71  Resp: 15 20  Temp: 97.7 F (36.5 C) 97.7 F (36.5 C)  SpO2: 96% 100%   Body mass index is 37.55 kg/m.  General:  WDWN in NAD Gait: Not observed HENT: WNL, normocephalic Pulmonary: normal non-labored breathing, without Rales, rhonchi,  wheezing Cardiac: regular, without  Murmurs, rubs or gallops Abdomen: soft, NT/ND, no masses Skin: without rashes Vascular Exam/Pulses:  Right Left  Radial 2+ (normal) 2+ (normal)  Ulnar    Femoral 2+ (normal) 2+ (normal)  Popliteal    DP 2+ (normal) 2+ (normal)  PT     Extremities: without ischemic changes, without Gangrene , without cellulitis; without open wounds;  Musculoskeletal: no muscle wasting or atrophy       Neurologic: A&O X 3; Appropriate Affect ; SENSATION: normal; MOTOR FUNCTION:  moving all  extremities equally. Speech is fluent/normal.  CN II-XII grossly intact.  CBC Labs (Brief)          Component Value Date/Time   WBC 9.0 11/18/2018 0432   RBC 4.03 11/18/2018 0432   HGB 11.9 (L) 11/18/2018 0432   HCT 35.7 (L) 11/18/2018 0432   PLT 178 11/18/2018 0432   MCV 88.6 11/18/2018 0432   MCH 29.5 11/18/2018 0432   MCHC 33.3 11/18/2018 0432   RDW 13.5 11/18/2018 0432      BMET Labs (Brief)          Component Value Date/Time   NA 141 11/18/2018 0432   K 3.7 11/18/2018 0432   CL 110 11/18/2018 0432   CO2 25 11/18/2018 0432   GLUCOSE 238 (H) 11/18/2018 0432   BUN 12 11/18/2018 0432   CREATININE 0.69 11/18/2018 0432   CALCIUM 8.5 (L) 11/18/2018 0432   GFRNONAA >60 11/18/2018 0432   GFRAA >60 11/18/2018 0432      COAGS: Recent Labs  No results found for: INR, PROTIME     Non-Invasive Vascular Imaging:    I [facility] and I agree she has at least a 70% circumferential calcified stenosis in the right ICA at the bifurcation.  The lesion appears fairly low well below the jawline.  No significant contralateral disease.  Left vert is dominant and is widely patent.   ASSESSMENT/PLAN: This is a 73 y.o. female with symptomatic approximately 70% right ICA stenosis in the setting of recent right basal ganglia infarct with left sided weakness and numbness in her arm and slurred speech.  Discussed with patient that suspect her recent stroke was related to her right carotid stenosis.  We have recommended right carotid endarterectomy for stroke reduction.  I discussed procedure with her in detail.  We will plan for Friday with me for right carotid endarterectomy.  Okay to continue Plavix, but I would hold Brilinta given plan for operative intervention this week.  Risks discussed including bleeding, cranial nerve injury, re-stenosis, risk of peri-operative stroke, etc.  Cephus Shellinghristopher J. Reilyn Nelson,  MD [facility] [place] Office: [phone] Pager: [phone]

## 2018-11-22 NOTE — Progress Notes (Signed)
Patient arrived to 4 E room 08 at this time. Telemery applied and CCMD notified. 2nd verifier present. CHG bath done. Vital signs and assessment complete. Aline zero'd. Site clean dry and intact. Patient oriented to room and how to use call bell.

## 2018-11-22 NOTE — Progress Notes (Signed)
   Subjective: [name] had her right carotid endarterectomy this morning. She is doing well. She reports some pain in her left knee. We discussed plans to perform an arthrocentesis and she wanted to proceed. She has no other complaints or concerns.   Objective:  Vital signs in last 24 hours: Vitals:   11/21/18 1837 11/21/18 2003 11/22/18 0100 11/22/18 0532  BP: (!) 179/84 138/72 (!) 145/69 128/62  Pulse: 89 94 84 84  Resp: 18 19 18 20   Temp: 97.9 F (36.6 C) 97.7 F (36.5 C) 98.9 F (37.2 C) 99.5 F (37.5 C)  TempSrc: Oral Oral Oral Oral  SpO2: 99% 98% 95% 93%  Weight:      Height:        Physical Exam  Constitutional: She is oriented to person, place, and time and well-developed, well-nourished, and in no distress.  HENT:  Head: Normocephalic and atraumatic.  Eyes: Pupils are equal, round, and reactive to light. EOM are normal.  Cardiovascular: Normal rate, regular rhythm, normal heart sounds and intact distal pulses.  No murmur heard. Pulmonary/Chest: Effort normal and breath sounds normal. No respiratory distress. She has no wheezes.  Abdominal: Soft. Bowel sounds are normal. She exhibits no distension.  Musculoskeletal: Normal range of motion.     Comments: Tenderness over left medial knee, warm to touch, non erythematous, palpable fluid; bruising on bilateral posterior forearms   Neurological: She is alert and oriented to person, place, and time. No cranial nerve deficit.  Skin: Skin is warm and dry. No rash noted. She is not diaphoretic. No erythema.  Psychiatric: Affect normal.   I/Os: 150 ml blood loss from surgery   Labs:  BMP Latest Ref Rng & Units 11/22/2018 11/21/2018 11/21/2018  Glucose 70 - 99 mg/dL 190(H) 65(L) 77  BUN 8 - 23 mg/dL 11 15 14   Creatinine 0.44 - 1.00 mg/dL 0.64 0.70 0.81  Sodium 135 - 145 mmol/L 139 141 140  Potassium 3.5 - 5.1 mmol/L 3.6 3.3(L) 3.3(L)  Chloride 98 - 111 mmol/L 107 102 103  CO2 22 - 32 mmol/L 25 - 26  Calcium 8.9 - 10.3  mg/dL 8.5(L) - 9.5   CBC Latest Ref Rng & Units 11/22/2018 11/21/2018 11/21/2018  WBC 4.0 - 10.5 K/uL 12.2(H) - 13.9(H)  Hemoglobin 12.0 - 15.0 g/dL 12.3 12.6 13.6  Hematocrit 36.0 - 46.0 % 36.6 37.0 41.2  Platelets 150 - 400 K/uL 173 - 198    Assessment/Plan:  Assessment: [name] is a 73 yo F with a hx of CHF, DMII, hyperlipidemia, hypertension and recent stroke for which she received TPA who presented with left knee pain after a mechanical fall 5 days after right basal ganglia infarct.   Plan: Active Problems:   Fall -pt with mechanical fall 5 days after right basal ganglia infarct, no new infarct or bleed on MRI, unremarkable imaging since falls -isolated pain and swelling on exam with warmth and tenderness but non erythematous; no concern for septic joint; traumatic vs hemorrhagic vs inflammatory -[place] and aspirate and send fluid to lab -PT OT -tylenol for pain   Carotid Endarterectomy -pt underwent right carotid endarterectomy with Dr. Carlis Abbott in Vascular Surgery this AM   Dispo: Anticipated discharge in approximately 1 day.   Al Decant, MD 11/22/2018, 5:42 AM Pager: [phone]

## 2018-11-22 NOTE — Op Note (Signed)
OPERATIVE NOTE  PROCEDURE:   1.  right carotid endarterectomy with bovine patch angioplasty 2.  right intraoperative carotid ultrasound  PRE-OPERATIVE DIAGNOSIS: right symptomatic high grade carotid stenosis  POST-OPERATIVE DIAGNOSIS: same as above   SURGEON: Cephus Shellinghristopher J. Micheline Markes, MD  ASSISTANT(S): Emilie RutterMatthew Eveland, PA  ANESTHESIA: general  ESTIMATED BLOOD LOSS: <50 cc  FINDING(S): 1.  Continuous Doppler audible flow signatures are appropriate for each carotid artery. 2.  No evidence of intimal flap visualized on transverse or longitudinal ultrasonography. 3.  High grade calcified right carotid plaque.  SPECIMEN(S):  Carotid plaque (sent to Pathology)  INDICATIONS:   [name] is a 73 y.o. female who presents with right symptomatic high grade carotid stenosis.  She presented with left sided weakness and had an MRI that showed right basal ganglia infarct with high grade right carotid stenosis.  I discussed with the patient the risks, benefits, and alternatives to carotid endarterectomy.  I discussed the procedural details of carotid endarterectomy with the patient.  The patient is aware that the risks of carotid endarterectomy include but are not limited to: bleeding, infection, stroke, myocardial infarction, death, cranial nerve injuries both temporary and permanent, neck hematoma, possible airway compromise, labile blood pressure post-operatively, cerebral hyperperfusion syndrome, and possible need for additional interventions in the future. The patient is aware of the risks and agrees to proceed forward with the procedure.  DESCRIPTION: After full informed written consent was obtained from the patient, the patient was brought back to the operating room and placed supine upon the operating table.  Prior to induction, the patient received IV antibiotics.  After obtaining adequate anesthesia, the patient was placed into semi-Fowler position with a shoulder roll in place and the  patient's neck slightly hyperextended and rotated away from the surgical site.  The patient was prepped in the standard fashion for a right carotid endarterectomy.  I made an incision anterior to the sternocleidomastoid muscle and dissected down through the subcutaneous tissue.  The platysma was opened with electrocautery.  I then used Bovie cautery and blunt dissection to dissect through the underlying platysma and to mobilize the anterior border of the sternocleidomastoid as well as the internal jugular vein laterally.  The facial vein was ligated with 3-0 silk and surgical clips and divided.  I also ligated another branch off the internal jugular vein and ligated it between 3-0 silk ties and divided it.  After identifying the carotid artery I used Metzenbaum scissors to bluntly dissect the common carotid artery and then controlled this with a umbilical tape.  At this point in time the patient was given 100 units/kg of IV heparin and we checked an ACT to ensure it was greater than 250.  I then carried my dissection cephalad and mobilized the external carotid artery and superior thyroid artery and controlled each of these with a vessel loop.  I then dissected out the internal carotid artery well past the distal plaque.  The plaque extended very high and I had to mobilize the right hypolossal nerve for added exposure.  The internal carotid artery was then controlled with a Vesseloop as well. I was careful to identify the vagus nerve between the internal jugular and common carotid and this was presereved.  I was also careful to identify and preserve the hypoglossal nerve and this was preserved.    Once our ACT was confirmed, I proceeded by clamping the internal carotid artery with a angled bulldog clamp first above the plaque.  The proximal common  carotid artery was controlled with a angled debakey clamp.  The external carotid was controlled with a vessel loop.  I subsequently opened the common carotid artery with an  11 blade scalpel in longitudinal fashion and extended the arteriotomy with Potts scissors onto the ICA past the distal plaque.  I then used a Garment/textile technologist and performed a endarterectomy starting in the common carotid artery.  The external carotid artery was endarterectomized with an eversion technique and I was careful to feather the distal ICA plaque.  The specimen was passed off the field.  At this point a 10 [place] shunt was brought to the field and then initially placed distally into the ICA after removing the clamp and the shunt was back bleed with good flow.  I then placed the proximal end of the shunt in the common carotid artery and controlled this with a Rummel tourniquet.  The endarterectomy site was then flushed with heparinized saline and I was careful to ensure there were no flaps in the endarterectomy site.  The distal flap in the ICA looked good, but I did tack this down with several 7-0 Prolenes.  I then brought a bovine carotid patch on the field and this was sewn in place with a running anastomosis using a 6-0 Prolene distally and a 5-0 proximal.  The bovine patch was trimmed accordingly.  The shunt was removed just before completion of the patch.  The artery was flushed antegrade and retrograde prior to completion of the patch.  Once the patch was complete, I flushed up the external carotid artery first prior to releasing the internal carotid artery clamp.  An intraoperative duplex was performed that showed no intimal flaps.  Doppler was also performed with good signals in the ICA and ECA.  Once I was happy with the intraoperative ultrasound the patient was given 50 mg protamine for reversal.  I used Surgicel Snow to get hemostasis around the patch.  Ultimately the platysma was closed in running fashion with 3-0 Vicryl.  The skin was closed with a running 4-0 Monocryl.  Dermabond was applied with a dry sterile dressing.  The patient was awakened from anesthesia with no new neurological  deficit and taken to PACU in stable condition.    COMPLICATIONS: None  CONDITION: Stable  Marty Heck, MD [facility] [place] Office: [phone] Pager: [phone]  11/22/2018, 9:50 AM

## 2018-11-22 NOTE — Anesthesia Procedure Notes (Signed)
Arterial Line Insertion Start/End7/17/2020 7:04 AM, 11/22/2018 7:10 AM Performed by: CRNA  Patient location: Pre-op. Preanesthetic checklist: patient identified, IV checked, site marked, risks and benefits discussed, surgical consent, monitors and equipment checked, pre-op evaluation, timeout performed and anesthesia consent Lidocaine 1% used for infiltration Right, radial was placed Hand hygiene performed  and maximum sterile barriers used   Attempts: 2 Procedure performed without using ultrasound guided technique. Following insertion, dressing applied and Biopatch. Post procedure assessment: normal  Patient tolerated the procedure well with no immediate complications.

## 2018-11-22 NOTE — Procedures (Signed)
After consent was obtained, using sterile technique the knee was prepped and plain Lidocaine 1% was used as local anesthetic. The joint was entered and 60 ml's of reddish brown colored fluid was withdrawn and sent for cell count + diff w/ crystals.  The procedure was well tolerated.  The patient is asked to continue to rest the joint for a few more days before resuming regular activities.  It may be more painful for the first 1-2 days.  Watch for fever, or increased swelling or persistent pain in the joint. Call or return to clinic prn if such symptoms occur or there is failure to improve as anticipated.  Al Decant, MD 11/22/2018, 2:24 PM Pager: [phone]

## 2018-11-22 NOTE — Progress Notes (Signed)
OT Cancellation Note  Patient Details Name: [name] MRN: [id number] DOB: [birth year]   Cancelled Treatment:    Reason Eval/Treat Not Completed: Patient at procedure or test/ unavailable. Pt currently in OR.  [place], OTR/L Acute Rehab Services Pager [phone] Office [phone]     Almon Register 11/22/2018, 8:22 AM

## 2018-11-22 NOTE — Progress Notes (Addendum)
Patient's BP 94/48, HR 78, O2 94% RA. Patient sat up on the side of the bed with PT a bit ago and says she got a "little dizzy". Patient states she still feels " a bit dizzy" but no other symptoms. Dagoberto Ligas, PA paged at this time. Will continue to monitor.   1707: Rapid response called. 500 ml NS bolus to be given. Started at this time.   1725: Spoke with Dr. Carlis Abbott. New orders received.   Spoke with pharmacy, order clarified.   Dopamine started 1818.  1848: BP 170/80 ART line 193/82 HR 134 O2 93% Dopamine stopped. Dr. Carlis Abbott notified.  Primary MD paged at this time.   Emelda Fear, RN

## 2018-11-22 NOTE — Progress Notes (Signed)
  Date: 11/22/2018  Patient name: [name]  Medical record number: [id number]  Date of birth: [birth year]   I have seen and evaluated Johny Blamer and discussed their care with the Residency Team. Ms Joo is a 73 year old community dwelling woman with a history of an ischemic right basal ganglia stroke treated with TPA at [facility] and admitted from July 10 to July 13 of this year. Two days prior to yesterday's admission, on July 15, her left leg gave out and she fell to the floor.  She was seen in the ED and had a CT of her head, neck, and knee and was discharged home.  These ER records are not available for review.  She was unable to walk due to left knee pain and swelling so she came to the ER yesterday on the 16th and was admitted.  Patient had an outpatient right carotid endarterectomy scheduled which was performed as an inpatient this morning.  The team saw her postop today and she denied any neck pain but continues to endorse pain of her left knee.  Vitals:   11/22/18 1116 11/22/18 1132  BP: 135/67   Pulse: 87   Resp: 19   Temp: 98.7 F (37.1 C)   SpO2: 93% 92%  General no acute distress lying in bed Neck surgical incision right neck without any bleeding or drainage HRRR no MRG LCTAB with good airflow L knee effusion noted, no erythema, she is quite tender to palpation over the left medial knee. Skin multiple ecchymosis right knee, left extremity, and upper extremities  Pertinent labs Synovial fluid was red, contained intracellular calcium pyrophosphate crystals, 6000 WBCs, Gram stain and culture pending WBC 12.2 today UA 6-10 squamous epithelial cells, large leukocyte Estrace, negative nitrite, greater than 50 WBCs, 6-10 RBCs  MRI on admission showed evolving subacute right basal ganglial infarct but no other changes  Assessment and Plan: I have seen and evaluated the patient as outlined above. I agree with the formulated Assessment and Plan as detailed in the residents' note,  with the following changes:  Ms Radick is a 73 year old community dwelling woman with a history of an ischemic right basal ganglia stroke treated with TPA at [facility] and admitted from July 10 to July 13 of this year.  She had a fall at home and was subsequently unable to ambulate due to swelling and pain.her MRI on admission r/o a CVA as the etiology of her fall.  She has now undergone an arthrocentesis that shows CPPD crystals.  This is likely asymptomatic as her pain and swelling in her left knee was sudden after the fall.  The synovial fluid also contained RBCs indicating a traumatic injury.  She will be treated symptomatically and PT and OT will be consulted.  Her right CEA was completed as an inpatient and she is recovering well.  1.  Inability to ambulate following a mechanical fall - PT/OT.   2.  Asymptomatic CPPD - nothing further needed  3.  Left knee effusion secondary to hemarthrosis secondary to a fall - PT/OT consult.  Tylenol for pain.  4.  Status post right CEA - follow vascular notes.  5.  Status post recent right basal ganglial stroke - cont aspirin, atorvastatin 80 mg, clopidogrel, and Zetia.  Dispo depends on PT and OT recommendations.  She lives with her husband but he is unable to assist her with safety.  Bartholomew Crews, MD 7/17/20204:27 PM

## 2018-11-23 LAB — CBC
HCT: 29.6 % — ABNORMAL LOW (ref 36.0–46.0)
Hemoglobin: 9.6 g/dL — ABNORMAL LOW (ref 12.0–15.0)
MCH: 29.4 pg (ref 26.0–34.0)
MCHC: 32.4 g/dL (ref 30.0–36.0)
MCV: 90.5 fL (ref 80.0–100.0)
Platelets: 154 10*3/uL (ref 150–400)
RBC: 3.27 MIL/uL — ABNORMAL LOW (ref 3.87–5.11)
RDW: 13.2 % (ref 11.5–15.5)
WBC: 10.3 10*3/uL (ref 4.0–10.5)
nRBC: 0 % (ref 0.0–0.2)

## 2018-11-23 LAB — BASIC METABOLIC PANEL
Anion gap: 7 (ref 5–15)
BUN: 18 mg/dL (ref 8–23)
CO2: 23 mmol/L (ref 22–32)
Calcium: 8.1 mg/dL — ABNORMAL LOW (ref 8.9–10.3)
Chloride: 107 mmol/L (ref 98–111)
Creatinine, Ser: 0.93 mg/dL (ref 0.44–1.00)
GFR calc Af Amer: >60 mL/min (ref >60)
GFR calc non Af Amer: >60 mL/min (ref >60)
Glucose, Bld: 263 mg/dL — ABNORMAL HIGH (ref 70–99)
Potassium: 4 mmol/L (ref 3.5–5.1)
Sodium: 137 mmol/L (ref 135–145)

## 2018-11-23 LAB — GLUCOSE, CAPILLARY
Glucose-Capillary: 206 mg/dL — ABNORMAL HIGH (ref 70–99)
Glucose-Capillary: 215 mg/dL — ABNORMAL HIGH (ref 70–99)
Glucose-Capillary: 215 mg/dL — ABNORMAL HIGH (ref 70–99)
Glucose-Capillary: 232 mg/dL — ABNORMAL HIGH (ref 70–99)
Glucose-Capillary: 245 mg/dL — ABNORMAL HIGH (ref 70–99)
Glucose-Capillary: 251 mg/dL — ABNORMAL HIGH (ref 70–99)

## 2018-11-23 MED ORDER — SODIUM CHLORIDE 0.9 % IV BOLUS
1000.0000 mL | Freq: Once | INTRAVENOUS | Status: AC
  Administered 2018-11-23: 17:00:00 1000 mL via INTRAVENOUS

## 2018-11-23 MED ORDER — HYDROCODONE-ACETAMINOPHEN 5-325 MG PO TABS: 1.0000 | ORAL_TABLET | Freq: Four times a day (QID) | ORAL | Status: DC | PRN

## 2018-11-23 MED ORDER — INSULIN GLARGINE 100 UNIT/ML ~~LOC~~ SOLN
20.0000 [IU] | Freq: Every day | SUBCUTANEOUS | Status: DC
  Administered 2018-11-23 – 2018-11-26 (×4): 20 [IU] via SUBCUTANEOUS
  Filled 2018-11-23 (×4): qty 0.2

## 2018-11-23 MED ORDER — INSULIN ASPART 100 UNIT/ML ~~LOC~~ SOLN
3.0000 [IU] | Freq: Three times a day (TID) | SUBCUTANEOUS | Status: DC
  Administered 2018-11-23 – 2018-11-26 (×8): 3 [IU] via SUBCUTANEOUS

## 2018-11-23 NOTE — Progress Notes (Signed)
  Date: 11/23/2018  Patient name: [name] record number: [id number]  Date of birth: May 27, 1945   This patient's plan of care was discussed with the house staff. Please see Dr. Frederico Hamman' note for complete details. I concur with her findings.   Sid Falcon, MD 11/23/2018, 8:18 PM

## 2018-11-23 NOTE — NC FL2 (Signed)
[place] MEDICAID FL2 LEVEL OF CARE SCREENING TOOL     IDENTIFICATION  Patient Name: [name] Birthdate: [birth year] Sex: female Admission Date (Current Location): 11/21/2018  [facility] and [place] Number:  [employer] and Address:  [facility], [place] [address], [place], [place] [address]      Provider Number: [id number]  Attending Physician Name and Address:  Bartholomew Crews, MD  Relative Name and Phone Number:  Noralee Space, son, [phone]    Current Level of Care: Hospital Recommended Level of Care: [place] Prior Approval Number:    Date Approved/Denied:   PASRR Number: [id number]  Discharge Plan: SNF    Current Diagnoses: Patient Active Problem List   Diagnosis Date Noted  . Acute pain of left knee   . Fall 11/21/2018  . Carotid stenosis, right 11/18/2018  . Diabetes mellitus type II, uncontrolled ([place]) 11/18/2018  . Family hx-stroke 11/18/2018  . Cerebral infarction due to stenosis of right carotid artery ([place]) s/p tPA 11/15/2018  . Hyperlipidemia 01/14/2018  . COPD (chronic obstructive pulmonary disease) ([place]) 01/14/2018  . Angina pectoris ([place]) 07/28/2016  . Chronic diastolic congestive heart failure ([place]) 07/28/2016  . Hypertensive heart disease with heart failure ([place]) 07/28/2016  . Diabetic polyneuropathy associated with type 2 diabetes mellitus ([place]) 11/30/2015    Orientation RESPIRATION BLADDER Height & Weight     Self, Time, Place, Situation  Normal Continent, External catheter Weight: 198 lb (89.8 kg) Height:  5\' 3"  (160 cm)  BEHAVIORAL SYMPTOMS/MOOD NEUROLOGICAL BOWEL NUTRITION STATUS      Continent Diet(Please see DC Summary)  AMBULATORY STATUS COMMUNICATION OF NEEDS Skin   Limited Assist Verbally Surgical wounds                       Personal Care Assistance Level of Assistance  Bathing, Feeding, Dressing Bathing Assistance: Limited assistance Feeding assistance: Independent Dressing  Assistance: Limited assistance     Functional Limitations Info  Sight, Hearing, Speech Sight Info: Adequate Hearing Info: Adequate Speech Info: Adequate    SPECIAL CARE FACTORS FREQUENCY  PT (By licensed PT), OT (By licensed OT)     PT Frequency: 5x/week OT Frequency: 5x/week            Contractures Contractures Info: Not present    Additional Factors Info  Code Status, Allergies, Isolation Precautions, Insulin Sliding Scale Code Status Info: Full Allergies Info: NKA   Insulin Sliding Scale Info: See dc summary for dose Isolation Precautions Info: MRSA in the nose     Current Medications (11/23/2018):  This is the current hospital active medication list Current Facility-Administered Medications  Medication Dose Route Frequency Provider Last Rate Last Dose  . 0.9 %  sodium chloride infusion  500 mL Intravenous Once PRN Dagoberto Ligas, PA-C      . acetaminophen (TYLENOL) tablet 650 mg  650 mg Oral Q6H Al Decant, MD   650 mg at 11/23/18 [phone]  . alum & mag hydroxide-simeth (MAALOX/MYLANTA) 200-200-20 MG/5ML suspension 15-30 mL  15-30 mL Oral Q2H PRN Dagoberto Ligas, PA-C      . aspirin EC tablet 81 mg  81 mg Oral Daily Dagoberto Ligas, PA-C   81 mg at 11/23/18 0924  . atorvastatin (LIPITOR) tablet 80 mg  80 mg Oral QHS Dagoberto Ligas, PA-C   80 mg at 11/22/18 2123  . Chlorhexidine Gluconate Cloth 2 % PADS 6 each  6 each Topical Q0600 Dagoberto Ligas, PA-C   6 each  at 11/22/18 0608  . clopidogrel (PLAVIX) tablet 75 mg  75 mg Oral Daily Emilie Rutterveland, Matthew, PA-C   75 mg at 11/23/18 [id number]  . docusate sodium (COLACE) capsule 100 mg  100 mg Oral Daily Emilie Rutterveland, Matthew, PA-C   100 mg at 11/23/18 [id number]  . DOPamine (INTROPIN) 800 mg in dextrose 5 % 250 mL (3.2 mg/mL) infusion  0-20 mcg/kg/min Intravenous Titrated Cephus Shellinglark, Christopher J, MD   Stopped at 11/22/18 1848  . enoxaparin (LOVENOX) injection 40 mg  40 mg Subcutaneous Q24H Emilie Rutterveland, Matthew, PA-C   40 mg at 11/23/18 [id number]  .  ezetimibe (ZETIA) tablet 10 mg  10 mg Oral Daily Emilie Rutterveland, Matthew, PA-C   10 mg at 11/23/18 [id number]  . gabapentin (NEURONTIN) capsule 300 mg  300 mg Oral QHS Emilie Rutterveland, Matthew, PA-C   300 mg at 11/22/18 2123  . HYDROcodone-acetaminophen (NORCO/VICODIN) 5-325 MG per tablet 1-2 tablet  1-2 tablet Oral Q6H PRN Burna CashSantos-Sanchez, Idalys, MD      . insulin aspart (novoLOG) injection 0-9 Units  0-9 Units Subcutaneous Q4H Emilie Rutterveland, Matthew, PA-C   3 Units at 11/23/18 0944  . insulin aspart (novoLOG) injection 3 Units  3 Units Subcutaneous TID WC Burna CashSantos-Sanchez, Idalys, MD   3 Units at 11/23/18 0944  . insulin glargine (LANTUS) injection 20 Units  20 Units Subcutaneous Daily Burna CashSantos-Sanchez, Idalys, MD   20 Units at 11/23/18 0945  . labetalol (NORMODYNE) injection 10 mg  10 mg Intravenous Q10 min PRN Emilie RutterEveland, Matthew, PA-C      . metoprolol tartrate (LOPRESSOR) injection 2-5 mg  2-5 mg Intravenous Q2H PRN Emilie RutterEveland, Matthew, PA-C      . mupirocin ointment (BACTROBAN) 2 % 1 application  1 application Nasal BID Emilie Rutterveland, Matthew, PA-C   1 application at 11/23/18 0948  . ondansetron (ZOFRAN) injection 4 mg  4 mg Intravenous Q6H PRN Emilie RutterEveland, Matthew, PA-C      . rOPINIRole (REQUIP) tablet 1 mg  1 mg Oral QHS Emilie RutterEveland, Matthew, PA-C   1 mg at 11/22/18 2123  . sodium chloride 0.9 % bolus 500 mL  500 mL Intravenous Once PRN Cephus Shellinglark, Christopher J, MD      . sodium chloride flush (NS) 0.9 % injection 3 mL  3 mL Intravenous Q12H Emilie RutterEveland, Matthew, PA-C   3 mL at 11/23/18 [id number]     Discharge Medications: Please see discharge summary for a list of discharge medications.  Relevant Imaging Results:  Relevant Lab Results:   Additional Information SSN: [id number]    COVID negative on 7/16  Renne CriglerNadia S Linnet Bottari, LCSW

## 2018-11-23 NOTE — Plan of Care (Signed)
  Problem: Education: Goal: Knowledge of General Education information will improve Description: Including pain rating scale, medication(s)/side effects and non-pharmacologic comfort measures Outcome: Progressing   Problem: Health Behavior/Discharge Planning: Goal: Ability to manage health-related needs will improve Outcome: Progressing   Problem: Clinical Measurements: Goal: Ability to maintain clinical measurements within normal limits will improve Outcome: Progressing Goal: Will remain free from infection Outcome: Progressing Goal: Diagnostic test results will improve Outcome: Progressing   Problem: Activity: Goal: Risk for activity intolerance will decrease Outcome: Progressing   Problem: Nutrition: Goal: Adequate nutrition will be maintained Outcome: Progressing   Problem: Coping: Goal: Level of anxiety will decrease Outcome: Progressing   Problem: Elimination: Goal: Will not experience complications related to bowel motility Outcome: Progressing   Problem: Pain Managment: Goal: General experience of comfort will improve Outcome: Progressing   Problem: Safety: Goal: Ability to remain free from injury will improve Outcome: Progressing   Problem: Skin Integrity: Goal: Risk for impaired skin integrity will decrease Outcome: Progressing   Problem: Education: Goal: Knowledge of disease or condition will improve Outcome: Progressing Goal: Knowledge of secondary prevention will improve Outcome: Progressing Goal: Knowledge of patient specific risk factors addressed and post discharge goals established will improve Outcome: Progressing Goal: Individualized Educational Video(s) Outcome: Progressing   Problem: Coping: Goal: Will verbalize positive feelings about self Outcome: Progressing Goal: Will identify appropriate support needs Outcome: Progressing   Problem: Health Behavior/Discharge Planning: Goal: Ability to manage health-related needs will  improve Outcome: Progressing   Problem: Self-Care: Goal: Ability to participate in self-care as condition permits will improve Outcome: Progressing Goal: Verbalization of feelings and concerns over difficulty with self-care will improve Outcome: Progressing Goal: Ability to communicate needs accurately will improve Outcome: Progressing   Problem: Nutrition: Goal: Risk of aspiration will decrease Outcome: Progressing Goal: Dietary intake will improve Outcome: Progressing   Problem: Intracerebral Hemorrhage Tissue Perfusion: Goal: Complications of Intracerebral Hemorrhage will be minimized Outcome: Progressing   Problem: Ischemic Stroke/TIA Tissue Perfusion: Goal: Complications of ischemic stroke/TIA will be minimized Outcome: Progressing   Problem: Spontaneous Subarachnoid Hemorrhage Tissue Perfusion: Goal: Complications of Spontaneous Subarachnoid Hemorrhage will be minimized Outcome: Progressing   

## 2018-11-23 NOTE — Progress Notes (Signed)
   Subjective:  Hypotensive overnight. VVS ordered IVF and short-term dopamine.  This morning patient is feeling well and has no acute complaints.  He is eating well and having good urine output as well as bowel movements.  Discussed likely need for SNF for further rehabilitation which she is agreeable to as she states she does not have support at home.  Objective:  Vital signs in last 24 hours: Vitals:   11/23/18 0115 11/23/18 0145 11/23/18 0200 11/23/18 0427  BP: (!) 96/56 (!) 100/58 (!) 92/52 (!) 96/51  Pulse: 70 69 66 70  Resp: 18 17 18 19   Temp:    98.2 F (36.8 C)  TempSrc:    Oral  SpO2: 90% 93% 93% 91%  Weight:      Height:       General: Tired appearing female in bed in no acute distress CV: regular rate and rhythm without murmurs Neuro: Unable to lift left lower extremity against gravity.  Decreased sensation left lower extremity. Ext: Warm and well-perfused without peripheral edema MSK: L knee appears less swollen and without signs of infection   Assessment/Plan:  Active Problems:   Fall   Acute pain of left knee  # Fall, mechanical: This is secondary to left-sided deficits from recent stroke as well as deconditioning.  PT and OT have recommended SNF for further rehabilitation which patient is agreeable to. Social work has been consulted and working on placement.  Appreciate assistance.   # Hypotension: Patient had an episode of symptomatic hypotension overnight requiring short-term dopamine drip ordered by VVS.  On review of chart it seems she did receive several medications for pain which could have been the cause of this transient hypotension. She is currently normotensive. Will continue to monitor  BP.   # L knee pain: Resolved after therapeutic arthrocentesis yesterday.   # Recent ischemic CVA s/p R endarterectomy: POD #1 and doing well. Per VVS, stable for discharge. Will discontinue arterial line.    Dispo: Anticipated discharge in approximately 1-2 day(s)  pending SNF placement.   Welford Roche, MD 11/23/2018, 7:28 AM Pager: [phone]

## 2018-11-23 NOTE — Progress Notes (Signed)
[facility]  Subjective  - No complaints.  Sitting up in bed.   Objective (!) 104/55 75 98.8 F (37.1 C) (Oral) 17 91%  Intake/Output Summary (Last 24 hours) at 11/23/2018 0928 Last data filed at 11/23/2018 0900 Gross per 24 hour  Intake 3163.24 ml  Output 875 ml  Net 2288.24 ml    Right neck incision with some bruising and small amount of fullness - all very soft - no hematoma - incision c/d/i Left grip strength weak as noted pre-op - 4/5 - no other appreciable deficits  Laboratory Lab Results: Recent Labs    11/22/18 0225 11/23/18 0421  WBC 12.2* 10.3  HGB 12.3 9.6*  HCT 36.6 29.6*  PLT 173 154   BMET Recent Labs    11/22/18 0225 11/23/18 0421  NA 139 137  K 3.6 4.0  CL 107 107  CO2 25 23  GLUCOSE 190* 263*  BUN 11 18  CREATININE 0.64 0.93  CALCIUM 8.5* 8.1*    COAG Lab Results  Component Value Date   INR 1.0 11/21/2018   No results found for: PTT  Assessment/Planning: POD #1 s/p right carotid endarterectomy.  Needs aspirin plavix for one month post-op.  OK for discharge from vascular standpoint.  Apprecaite PT/OT for recommendations.  She's had left sided weakness since her stroke and likely impacted her fall that prompted this admission.  Agree with SNF for ongoing recovery - states she has no help at home.  Can d/c aline.    Marty Heck 11/23/2018 9:28 AM --

## 2018-11-23 NOTE — Evaluation (Signed)
Occupational Therapy Evaluation Patient Details Name: [name] MRN: [id number] DOB: 1945/11/18 Today's Date: 11/23/2018    History of Present Illness Pt is a 73 y/o female presenting after fall with L knee pain. Pt is s/p aspiration of fluid of L knee. Pt also with recent CVA with L sided deficits. Pt is s/p R carotid endarterectomy. PMH includes CHF, DM, HTN, and CVA.    Clinical Impression   PTA, pt was living with her husband and required assistance for BADLs and used RW; pt reporting her husband unable to assist her with ADLs at home. Pt highly motivated to participate in therapy and go to rehab at dc. Pt requiring requiring Min A for UB ADLs, Max A for LB ADLs, and Min A for lateral scoot to drop arm recliner. Pt requiring significant time and effort to perform lateral scoot and pt very fearful of falling.  Pt would benefit from further acute OT to facilitate safe dc. Recommend dc to SNF for further OT to optimize safety, independence with ADLs, and return to PLOF.      Follow Up Recommendations  Supervision/Assistance - 24 hour;SNF ; requesting [facility] in [place]   Equipment Recommendations  None recommended by OT    Recommendations for Other Services PT consult     Precautions / Restrictions Precautions Precautions: Fall Precaution Comments: Pt reports multiple falls since being d/c'd from hospital.  Restrictions Weight Bearing Restrictions: No      Mobility Bed Mobility Overal bed mobility: Needs Assistance Bed Mobility: Supine to Sit     Supine to sit: Min assist;HOB elevated(51degrees)     General bed mobility comments: Pt using HOB to elevate trunk ang then pushing her hips towards the EOB. Requesting OT's hand at the end of pull trunk into upright posture  Transfers Overall transfer level: Needs assistance Equipment used: Rolling walker (2 wheeled) Transfers: Sit to/from Stand;Lateral/Scoot Transfers Sit to Stand: Total assist;From elevated surface         Lateral/Scoot Transfers: Min assist;From elevated surface General transfer comment: Pt attempting sit<>stand three times with RW. Requiring Total A and unable to bring hips off of EOB. Pt reporting she is afraid she will fall adn then her left leg isn't working for her. Pt motivated to perform lateral scoot to drop arm recliner and requiring significant time and effort. Min A to block bilateral knees for assisting with weight shift and force    Balance Overall balance assessment: Needs assistance Sitting-balance support: No upper extremity supported;Feet supported Sitting balance-Leahy Scale: Fair                                     ADL either performed or assessed with clinical judgement   ADL Overall ADL's : Needs assistance/impaired Eating/Feeding: Set up;Supervision/ safety;Sitting   Grooming: Set up;Supervision/safety;Sitting   Upper Body Bathing: Minimal assistance;Sitting   Lower Body Bathing: Maximal assistance;Sitting/lateral leans   Upper Body Dressing : Minimal assistance;Sitting   Lower Body Dressing: Maximal assistance;Sitting/lateral leans Lower Body Dressing Details (indicate cue type and reason): Uses AE at home and husband assists.      Toileting- Clothing Manipulation and Hygiene: Minimal assistance;Sit to/from stand         General ADL Comments: Pt presenting with decreased strength, balance, and activity tolerance. Pt highly motivated to perform OOB activity. Pt attempting sit<>stand three times but reporting she is afraid she might fall and her left leg will  give out.      Vision Baseline Vision/History: Wears glasses Wears Glasses: At all times Patient Visual Report: Peripheral vision impairment;Diplopia       Perception     Praxis      Pertinent Vitals/Pain Pain Assessment: Faces Faces Pain Scale: Hurts even more Pain Location: RLE and L knee  Pain Descriptors / Indicators: Aching;Grimacing;Guarding Pain Intervention(s):  Monitored during session;Limited activity within patient's tolerance;Repositioned     Hand Dominance Right   Extremity/Trunk Assessment Upper Extremity Assessment Upper Extremity Assessment: Generalized weakness;LUE deficits/detail LUE Deficits / Details: Poor grasp strength and FM skills. Reports numbness all along arm.  LUE Sensation: decreased light touch;history of peripheral neuropathy LUE Coordination: decreased fine motor   Lower Extremity Assessment Lower Extremity Assessment: Defer to PT evaluation   Cervical / Trunk Assessment Cervical / Trunk Assessment: Kyphotic   Communication Communication Communication: No difficulties   Cognition Arousal/Alertness: Awake/alert Behavior During Therapy: WFL for tasks assessed/performed Overall Cognitive Status: Within Functional Limits for tasks assessed                                     General Comments  VSS. BP stable.    Exercises     Shoulder Instructions      Home Living Family/patient expects to be discharged to:: Skilled nursing facility                                    Lives With: Spouse    Prior Functioning/Environment Level of Independence: Independent with assistive device(s)  Gait / Transfers Assistance Needed: Uses RW for ambulation. ADL's / Homemaking Assistance Needed: Does bird baths; needs occasional help with LB dressing from husband. Sister helps with cooking/cleaning.   Comments: Pt reports she has been using RW for ambulation, however, has had falls secondary to LLE buckling.         OT Problem List: Decreased strength;Decreased activity tolerance;Impaired balance (sitting and/or standing);Impaired vision/perception;Decreased coordination;Decreased cognition;Decreased knowledge of use of DME or AE;Impaired sensation;Impaired UE functional use;Pain      OT Treatment/Interventions: Self-care/ADL training;Neuromuscular education;DME and/or AE instruction;Therapeutic  activities;Cognitive remediation/compensation;Visual/perceptual remediation/compensation;Patient/family education;Balance training    OT Goals(Current goals can be found in the care plan section) Acute Rehab OT Goals Patient Stated Goal: to get stronger in rehab before going home OT Goal Formulation: With patient Time For Goal Achievement: 12/07/18 Potential to Achieve Goals: Good  OT Frequency: Min 2X/week   Barriers to D/C:    Reports her husband cannot assist       Co-evaluation              AM-PAC OT "6 Clicks" Daily Activity     Outcome Measure Help from another person eating meals?: None Help from another person taking care of personal grooming?: A Little Help from another person toileting, which includes using toliet, bedpan, or urinal?: A Little Help from another person bathing (including washing, rinsing, drying)?: A Lot Help from another person to put on and taking off regular upper body clothing?: A Little Help from another person to put on and taking off regular lower body clothing?: A Lot 6 Click Score: 17   End of Session Equipment Utilized During Treatment: Gait belt;Rolling walker Nurse Communication: Mobility status  Activity Tolerance: Patient tolerated treatment well Patient left: in chair;with call bell/phone within reach  OT Visit  Diagnosis: Pain;Unsteadiness on feet (R26.81);Muscle weakness (generalized) (M62.81) Pain - Right/Left: Right Pain - part of body: Hip                Time: 2883-3744 OT Time Calculation (min): 38 min Charges:  OT General Charges $OT Visit: 1 Visit OT Evaluation $OT Eval Moderate Complexity: 1 Mod OT Treatments $Self Care/Home Management : 23-37 mins  [place], OTR/L Acute Rehab Pager: [phone] Office: [place] 11/23/2018, 12:40 PM

## 2018-11-23 NOTE — Plan of Care (Signed)
POC initiated and progressing. 

## 2018-11-23 NOTE — TOC Initial Note (Signed)
Transition of Care [facility]) - Initial/Assessment Note    Patient Details  Name: [name] MRN: [id number] Date of Birth: [birth year]  Transition of Care [facility]) CM/SW Contact:    Benard Halsted, LCSW Phone Number: 11/23/2018, 12:17 PM  Clinical Narrative:                 CSW received consult for possible SNF placement at time of discharge. CSW spoke with patient regarding PT recommendation of SNF placement at time of discharge. Patient reported that patient's spouse is currently unable to care for patient at their home given patient's current physical needs and fall risk. Patient expressed understanding of PT recommendation and is agreeable to SNF placement at time of discharge. Patient reports preference for [facility]. She does not want [facility] due to [place]. CSW discussed insurance authorization process. Patient expressed being hopeful for rehab and to feel better soon. No further questions reported at this time. CSW to continue to follow and assist with discharge planning needs.   Expected Discharge Plan: Skilled Nursing Facility Barriers to Discharge: Continued Medical Work up   Patient Goals and CMS Choice Patient states their goals for this hospitalization and ongoing recovery are:: Get stronger to return home CMS Medicare.gov Compare Post Acute Care list provided to:: Patient Choice offered to / list presented to : Patient  Expected Discharge Plan and Services Expected Discharge Plan: [place] In-house Referral: Clinical Social Work Discharge Planning Services: NA Post Acute Care Choice: [place] Living arrangements for the past 2 months: Single Family Home                 DME Arranged: N/A         HH Arranged: NA          Prior Living Arrangements/Services Living arrangements for the past 2 months: Single Family Home Lives with:: Spouse Patient language and need for interpreter reviewed:: Yes Do you feel safe going back to the  place where you live?: Yes      Need for Family Participation in Patient Care: No (Comment) Care giver support system in place?: Yes (comment) Current home services: DME Criminal Activity/Legal Involvement Pertinent to Current Situation/Hospitalization: No - Comment as needed  Activities of Daily Living Home Assistive Devices/Equipment: Bedside commode/3-in-1, Cane (specify quad or straight), Walker (specify type), CBG Meter, Dentures (specify type), Electric scooter, Hand-held shower hose, Long-handled shoehorn, Scales, Shower chair with back, Wheelchair ADL Screening (condition at time of admission) Patient's cognitive ability adequate to safely complete daily activities?: Yes Is the patient deaf or have difficulty hearing?: No Does the patient have difficulty seeing, even when wearing glasses/contacts?: No Does the patient have difficulty concentrating, remembering, or making decisions?: No Patient able to express need for assistance with ADLs?: Yes Does the patient have difficulty dressing or bathing?: Yes(since stroke last week) Independently performs ADLs?: No Communication: Independent Dressing (OT): Needs assistance Is this a change from baseline?: Pre-admission baseline Grooming: Needs assistance Is this a change from baseline?: Pre-admission baseline Feeding: Independent Bathing: Needs assistance Is this a change from baseline?: Pre-admission baseline Toileting: Needs assistance Is this a change from baseline?: Pre-admission baseline In/Out Bed: Needs assistance Is this a change from baseline?: Pre-admission baseline Walks in Home: Independent with device (comment) Does the patient have difficulty walking or climbing stairs?: Yes(unable to climb stairs) Weakness of Legs: Both Weakness of Arms/Hands: Left(due to recent stroke)  Permission Sought/Granted Permission sought to share information with : Chartered certified accountant granted  to share information  with : Yes, Verbal Permission Granted     Permission granted to share info w AGENCY: SNFs        Emotional Assessment Appearance:: Appears stated age Attitude/Demeanor/Rapport: Engaged Affect (typically observed): Pleasant, Accepting Orientation: : Oriented to Self, Oriented to Place, Oriented to  Time, Oriented to Situation Alcohol / Substance Use: Not Applicable Psych Involvement: No (comment)  Admission diagnosis:  Acute pain of left knee [M25.562] Patient Active Problem List   Diagnosis Date Noted  . Acute pain of left knee   . Fall 11/21/2018  . Carotid stenosis, right 11/18/2018  . Diabetes mellitus type II, uncontrolled (HCC) 11/18/2018  . Family hx-stroke 11/18/2018  . Cerebral infarction due to stenosis of right carotid artery (HCC) s/p tPA 11/15/2018  . Hyperlipidemia 01/14/2018  . COPD (chronic obstructive pulmonary disease) (HCC) 01/14/2018  . Angina pectoris (HCC) 07/28/2016  . Chronic diastolic congestive heart failure (HCC) 07/28/2016  . Hypertensive heart disease with heart failure (HCC) 07/28/2016  . Diabetic polyneuropathy associated with type 2 diabetes mellitus (HCC) 11/30/2015   PCP:  Paulina FusiSchultz, Douglas E, MD [facility]   [facility] [place] [address] [place] [address] Phone: [phone] Fax: [phone]  [facility] [place] [address] [place] [id number] Phone: [phone] Fax: [phone]     Social Determinants of Health (SDOH) Interventions    Readmission Risk Interventions No flowsheet data found.

## 2018-11-24 ENCOUNTER — Encounter (HOSPITAL_COMMUNITY): Payer: Self-pay | Admitting: *Deleted

## 2018-11-24 LAB — BASIC METABOLIC PANEL
Anion gap: 10 (ref 5–15)
BUN: 21 mg/dL (ref 8–23)
CO2: 20 mmol/L — ABNORMAL LOW (ref 22–32)
Calcium: 8.4 mg/dL — ABNORMAL LOW (ref 8.9–10.3)
Chloride: 112 mmol/L — ABNORMAL HIGH (ref 98–111)
Creatinine, Ser: 0.7 mg/dL (ref 0.44–1.00)
GFR calc Af Amer: >60 mL/min (ref >60)
GFR calc non Af Amer: >60 mL/min (ref >60)
Glucose, Bld: 156 mg/dL — ABNORMAL HIGH (ref 70–99)
Potassium: 3.8 mmol/L (ref 3.5–5.1)
Sodium: 142 mmol/L (ref 135–145)

## 2018-11-24 LAB — CBC
HCT: 31 % — ABNORMAL LOW (ref 36.0–46.0)
Hemoglobin: 9.8 g/dL — ABNORMAL LOW (ref 12.0–15.0)
MCH: 29 pg (ref 26.0–34.0)
MCHC: 31.6 g/dL (ref 30.0–36.0)
MCV: 91.7 fL (ref 80.0–100.0)
Platelets: 170 10*3/uL (ref 150–400)
RBC: 3.38 MIL/uL — ABNORMAL LOW (ref 3.87–5.11)
RDW: 13.4 % (ref 11.5–15.5)
WBC: 7.6 10*3/uL (ref 4.0–10.5)
nRBC: 0 % (ref 0.0–0.2)

## 2018-11-24 LAB — GLUCOSE, CAPILLARY
Glucose-Capillary: 136 mg/dL — ABNORMAL HIGH (ref 70–99)
Glucose-Capillary: 139 mg/dL — ABNORMAL HIGH (ref 70–99)
Glucose-Capillary: 155 mg/dL — ABNORMAL HIGH (ref 70–99)
Glucose-Capillary: 185 mg/dL — ABNORMAL HIGH (ref 70–99)
Glucose-Capillary: 195 mg/dL — ABNORMAL HIGH (ref 70–99)
Glucose-Capillary: 206 mg/dL — ABNORMAL HIGH (ref 70–99)

## 2018-11-24 LAB — MAGNESIUM: Magnesium: 1.6 mg/dL — ABNORMAL LOW (ref 1.7–2.4)

## 2018-11-24 MED ORDER — INSULIN ASPART 100 UNIT/ML ~~LOC~~ SOLN
0.0000 [IU] | Freq: Three times a day (TID) | SUBCUTANEOUS | Status: DC
  Administered 2018-11-24: 18:00:00 2 [IU] via SUBCUTANEOUS
  Administered 2018-11-24: 13:00:00 1 [IU] via SUBCUTANEOUS
  Administered 2018-11-25: 2 [IU] via SUBCUTANEOUS
  Administered 2018-11-26: 07:00:00 1 [IU] via SUBCUTANEOUS
  Administered 2018-11-26: 2 [IU] via SUBCUTANEOUS

## 2018-11-24 MED ORDER — MAGNESIUM SULFATE 2 GM/50ML IV SOLN
2.0000 g | Freq: Once | INTRAVENOUS | Status: AC
  Administered 2018-11-24: 2 g via INTRAVENOUS
  Filled 2018-11-24: qty 50

## 2018-11-24 NOTE — Progress Notes (Signed)
Orthopedic Tech Progress Note Patient Details:  [name] 04-25-1946 [id number] RN CALLED REQUESTING A KNEE IMMOBILIZER FOR PATIENT. WE WENT IN THERE TOGETHER AND APPLIED IT TO PATIENT Ortho Devices Type of Ortho Device: Knee Immobilizer Ortho Device/Splint Location: LRE Ortho Device/Splint Interventions: Adjustment, Application, Ordered   Post Interventions Patient Tolerated: Well Instructions Provided: Care of device, Adjustment of device   [name] 11/24/2018, 1:02 PM

## 2018-11-24 NOTE — Plan of Care (Signed)
  Problem: Education: Goal: Knowledge of General Education information will improve Description: Including pain rating scale, medication(s)/side effects and non-pharmacologic comfort measures Outcome: Progressing   Problem: Health Behavior/Discharge Planning: Goal: Ability to manage health-related needs will improve Outcome: Progressing   Problem: Clinical Measurements: Goal: Ability to maintain clinical measurements within normal limits will improve Outcome: Progressing Goal: Will remain free from infection Outcome: Progressing Goal: Diagnostic test results will improve Outcome: Progressing   Problem: Activity: Goal: Risk for activity intolerance will decrease Outcome: Progressing   Problem: Nutrition: Goal: Adequate nutrition will be maintained Outcome: Progressing   Problem: Coping: Goal: Level of anxiety will decrease Outcome: Progressing   Problem: Elimination: Goal: Will not experience complications related to bowel motility Outcome: Progressing   Problem: Pain Managment: Goal: General experience of comfort will improve Outcome: Progressing   Problem: Safety: Goal: Ability to remain free from injury will improve Outcome: Progressing   Problem: Skin Integrity: Goal: Risk for impaired skin integrity will decrease Outcome: Progressing   Problem: Education: Goal: Knowledge of disease or condition will improve Outcome: Progressing Goal: Knowledge of secondary prevention will improve Outcome: Progressing Goal: Knowledge of patient specific risk factors addressed and post discharge goals established will improve Outcome: Progressing Goal: Individualized Educational Video(s) Outcome: Progressing   Problem: Coping: Goal: Will verbalize positive feelings about self Outcome: Progressing Goal: Will identify appropriate support needs Outcome: Progressing   Problem: Health Behavior/Discharge Planning: Goal: Ability to manage health-related needs will  improve Outcome: Progressing   Problem: Self-Care: Goal: Ability to participate in self-care as condition permits will improve Outcome: Progressing Goal: Verbalization of feelings and concerns over difficulty with self-care will improve Outcome: Progressing Goal: Ability to communicate needs accurately will improve Outcome: Progressing   Problem: Nutrition: Goal: Risk of aspiration will decrease Outcome: Progressing Goal: Dietary intake will improve Outcome: Progressing   Problem: Intracerebral Hemorrhage Tissue Perfusion: Goal: Complications of Intracerebral Hemorrhage will be minimized Outcome: Progressing   Problem: Ischemic Stroke/TIA Tissue Perfusion: Goal: Complications of ischemic stroke/TIA will be minimized Outcome: Progressing   Problem: Spontaneous Subarachnoid Hemorrhage Tissue Perfusion: Goal: Complications of Spontaneous Subarachnoid Hemorrhage will be minimized Outcome: Progressing

## 2018-11-24 NOTE — Plan of Care (Signed)
POC in place and progressing 

## 2018-11-24 NOTE — Progress Notes (Signed)
  Date: 11/24/2018  Patient name: [place] record number: [id number]  Date of birth: 1945-07-24   I have seen and evaluated this patient and I have discussed the plan of care with the house staff. Please see Dr. Frederico Hamman' note for complete details. I concur with her findings and plan.  Her knee did not show a great amount of fluid on exam.  Could consider repeating ultrasound with possible arthrocentesis on Monday.     Sid Falcon, MD 11/24/2018, 8:23 PM

## 2018-11-24 NOTE — Progress Notes (Signed)
[facility] [place]  Subjective  - No complaints.  Sitting up in bed.   Objective 121/62 71 98.3 F (36.8 C) (Oral) 20 90%  Intake/Output Summary (Last 24 hours) at 11/24/2018 1021 Last data filed at 11/24/2018 0930 Gross per 24 hour  Intake 744.72 ml  Output 375 ml  Net 369.72 ml    Right neck incision with some bruising and small amount of fullness - all very soft still - no hematoma - incision c/d/i Left grip strength weak as noted pre-op - 4/5 - able to raise left leg off bed today  Laboratory Lab Results: Recent Labs    11/23/18 0421 11/24/18 0821  WBC 10.3 7.6  HGB 9.6* 9.8*  HCT 29.6* 31.0*  PLT 154 170   BMET Recent Labs    11/22/18 0225 11/23/18 0421  NA 139 137  K 3.6 4.0  CL 107 107  CO2 25 23  GLUCOSE 190* 263*  BUN 11 18  CREATININE 0.64 0.93  CALCIUM 8.5* 8.1*    COAG Lab Results  Component Value Date   INR 1.0 11/21/2018   No results found for: PTT  Assessment/Planning: POD #2 s/p right carotid endarterectomy.  Needs aspirin plavix for one month post-op.  OK for discharge from vascular standpoint.  Will arrange follow-up in our clinic in 3 weeks for wound check.  Apprecaite PT/OT for recommendations.  She's had left sided weakness since her stroke and likely impacted her fall that prompted this admission.  Agree with SNF for ongoing recovery - states she has no help at home.  States no new neurologic symptoms since surgery - feels she is at her baseline.    Marty Heck 11/24/2018 10:21 AM --

## 2018-11-24 NOTE — Progress Notes (Signed)
   Subjective:  No acute events overnight.  [name] continues to complain of left knee pain that is now located in the medial aspect of the knee.  She is requesting another arthrocentesis since the first one helped greatly with the pain.  She is otherwise doing well and has no other acute complaints.  Objective:  Vital signs in last 24 hours: Vitals:   11/23/18 1300 11/23/18 1641 11/23/18 1932 11/24/18 0401  BP: (!) 118/55 107/69 107/65 121/62  Pulse: 85 77 80 71  Resp: 20 (!) 24 (!) 22 20  Temp:  98.3 F (36.8 C) 98 F (36.7 C) 98.3 F (36.8 C)  TempSrc:  Oral Oral Oral  SpO2: 93% 94% 96% 90%  Weight:      Height:       General: Elderly female, in bed, no acute distress CV: Regular rate and rhythm without murmurs Pulm: Clear to auscultation bilaterally Neuro: Remains unable to lift left lower extremity associated with numbness to touch MSK: No left knee effusion appreciated  Assessment/Plan:  Active Problems:   Fall   Acute pain of left knee   # Fall # Deconditioning   Knee immobilizer placed today by ortho tech to help with ambulation.  Otherwise doing well and waiting for placement at a SNF.  # Recent ischemic CVA s/p R endarterectomy: POD #2. Her neurological exam is unchanged. She continues to work with PT and OT for strengthening. Medically stable for discharge per VVS.  # L knee pain: Much improved after arthrocentesis, but now complaining of medial L knee pain. Fluid culture no growth to date.  She is requesting a repeat arthrocentesis.  Discussed with her that we will repeat ultrasound tomorrow to check if there is reaccumulation of fluid.  There is probably a chronic component to her pain also as imaging at OSH showed moderate arthritis. Continue PT/OT.   # Hypotension: Resolved.   Dispo: Pending SNF placement.   Welford Roche, MD 11/24/2018, 6:46 AM Pager: [phone]

## 2018-11-24 NOTE — Progress Notes (Signed)
Called placed to IM service.  Updated on the 2 runs of VT, one 12 beat and one 14 beat.  Will continue to monitor.  Will call for changes lasting > 30 seconds or changes in vital signs.  Discussed need to up date knee brace to knee immobilizer.  Order received.

## 2018-11-25 ENCOUNTER — Encounter (HOSPITAL_COMMUNITY): Payer: Self-pay | Admitting: *Deleted

## 2018-11-25 DIAGNOSIS — R531 Weakness: Secondary | ICD-10-CM

## 2018-11-25 LAB — CBC
HCT: 28.7 % — ABNORMAL LOW (ref 36.0–46.0)
Hemoglobin: 9.3 g/dL — ABNORMAL LOW (ref 12.0–15.0)
MCH: 29.3 pg (ref 26.0–34.0)
MCHC: 32.4 g/dL (ref 30.0–36.0)
MCV: 90.5 fL (ref 80.0–100.0)
Platelets: 171 10*3/uL (ref 150–400)
RBC: 3.17 MIL/uL — ABNORMAL LOW (ref 3.87–5.11)
RDW: 13.3 % (ref 11.5–15.5)
WBC: 7.3 10*3/uL (ref 4.0–10.5)
nRBC: 0 % (ref 0.0–0.2)

## 2018-11-25 LAB — BASIC METABOLIC PANEL
Anion gap: 7 (ref 5–15)
BUN: 16 mg/dL (ref 8–23)
CO2: 23 mmol/L (ref 22–32)
Calcium: 8.2 mg/dL — ABNORMAL LOW (ref 8.9–10.3)
Chloride: 111 mmol/L (ref 98–111)
Creatinine, Ser: 0.64 mg/dL (ref 0.44–1.00)
GFR calc Af Amer: >60 mL/min (ref >60)
GFR calc non Af Amer: >60 mL/min (ref >60)
Glucose, Bld: 133 mg/dL — ABNORMAL HIGH (ref 70–99)
Potassium: 3.5 mmol/L (ref 3.5–5.1)
Sodium: 141 mmol/L (ref 135–145)

## 2018-11-25 LAB — GLUCOSE, CAPILLARY
Glucose-Capillary: 102 mg/dL — ABNORMAL HIGH (ref 70–99)
Glucose-Capillary: 176 mg/dL — ABNORMAL HIGH (ref 70–99)
Glucose-Capillary: 162 mg/dL — ABNORMAL HIGH (ref 70–99)
Glucose-Capillary: 166 mg/dL — ABNORMAL HIGH (ref 70–99)

## 2018-11-25 LAB — SARS CORONAVIRUS 2 BY RT PCR (HOSPITAL ORDER, PERFORMED IN ~~LOC~~ HOSPITAL LAB): SARS Coronavirus 2: NEGATIVE

## 2018-11-25 LAB — ABO/RH: ABO/RH(D): A POS

## 2018-11-25 LAB — POCT ACTIVATED CLOTTING TIME: Activated Clotting Time: 257 seconds

## 2018-11-25 NOTE — TOC Progression Note (Signed)
Transition of Care [facility]) - Progression Note    Patient Details  Name: [name] MRN: [id number] Date of Birth: [birth year]  [facility]) CM/SW [place], [place] Phone Number: 11/25/2018, 5:01 PM  Clinical Narrative:     Patient has accepted bed offer with [facility]. CSW requested Covid test today. Discharge pending covid test results.  Thurmond Butts, MSW, [facility] Clinical Social Worker [phone]   Expected Discharge Plan: Skilled Nursing Facility Barriers to Discharge: Continued Medical Work up  Expected Discharge Plan and Services Expected Discharge Plan: [place] In-house Referral: Clinical Social Work Discharge Planning Services: NA Post Acute Care Choice: [place] Living arrangements for the past 2 months: Single Family Home                 DME Arranged: N/A         HH Arranged: NA           Social Determinants of Health (SDOH) Interventions    Readmission Risk Interventions No flowsheet data found.

## 2018-11-25 NOTE — Consult Note (Signed)
   [facility] CM Inpatient Consult   11/25/2018  [name] [birth year] [id number]    Patient was readmitted back to the hospital with a fall at home, after her discharge on 11/18/18. Patient reviewed for15% risk scoreof unplanned readmission, with 7 day and 30 day readmissions and 2hospitalizations in the past 6 months,as a benefit under her Medicare/ [employer] plan. Patient had been outreached by [facility] care management coordinators (telephonic and community) in the past. Our community based plan of care has focused on disease management and community resource support.  Medical record review and MD history and physical dated 11/22/18, reveal as follows: This is a61 y.o.femalewith history of diabetes, lower extremity neuropathy, and CHF thatvascular surgery hasbeen consulted for symptomatic right ICA stenosis. Patient states she presented to [facility] with slurred speech as well as left arm weakness and tingling. She states she did receive TPA prior to transfer. She states all of her symptoms resolved except for she does have some ongoing weakness in the left hand. She states she has had at least 7 strokes in the past, with last strokeat least 10 years ago and was related to clot in the base of her brain. [facility] at [facility] prior to transfer did show a greater than 70% stenosis of the right ICA with only 30% stenosis on the left.  Her left sided weakness since her stroke had likely impacted her fall that prompted this admission.    (right symptomatic high grade carotid stenosis, status post right carotid endarterectomy with bovine patch angioplasty)  Inpatient DM coordinator is following patient during this hospitalization (A1c-10.4)  Review of medical records reveal that she is being recommended for skilled rehab therapies (SNF) by PT/ OT. Transition of care SW note, states that patient has accepted bed offer with [facility] and agrees with SNF for ongoing recovery since she has  no help at home.  Pleaserefer to [facility] care management if there are changes indisposition/ needs forfollow-upas appropriate.  [facility] post acute RN coordinator made aware of patient's disposition to follow-up needs.    For questions and additional information, please call:  Johnattan Strassman A. Antonio Creswell, BSN, RN-BC [facility] Liaison Cell: [phone]

## 2018-11-25 NOTE — Care Management Important Message (Signed)
Important Message  Patient Details  Name: [name] MRN: [id number] Date of Birth: 15-Nov-1945   Medicare Important Message Given:  Yes     Shelda Altes 11/25/2018, 1:38 PM

## 2018-11-25 NOTE — Progress Notes (Signed)
  Date: 11/25/2018  Patient name: [place] record number: [id number]  Date of birth: May 01, 1946   I have seen and evaluated this patient and I have discussed the plan of care with the house staff. Please see their note for complete details. I concur with their findings with the following additions/corrections: Ms. Carradine was seen this morning on team rounds.  Her left knee pain and swelling are improving.  She last worked with physical therapy on the 17th and was dizzy when sitting on the side of the bed so ambulation was not attempted.  PT and OT have recommended SNF.  Her synovial fluid from the 17th showed a CPPD crystals which I thought represented asymptomatic CPPD as all of her symptoms started after a traumatic fall.  I am okay trying a steroid knee injection as mobility is recurrent limiting factor to independent living.  She is medically stable to be transferred to SNF when a bed is available.  Bartholomew Crews, MD 11/25/2018, 1:23 PM

## 2018-11-25 NOTE — Progress Notes (Signed)
Physical Therapy Treatment Patient Details Name: [name] MRN: [id number] DOB: [birth year] Today's Date: 11/25/2018    History of Present Illness Pt is a 73 y/o female presenting after fall with L knee pain. Pt is s/p aspiration of fluid of L knee. Pt also with recent CVA with L sided deficits. Pt is s/p R carotid endarterectomy. PMH includes CHF, DM, HTN, and CVA.     PT Comments    Pt progressing towards goals. Pt had just gotten to chair with RN, therefore refusing any practice with transfers. Was agreeable to performing seated HEP. Current recommendations appropriate. Will continue to follow acutely to maximize functional mobility independence and safety.     Follow Up Recommendations  SNF;Supervision/Assistance - 24 hour     Equipment Recommendations  None recommended by PT    Recommendations for Other Services       Precautions / Restrictions Precautions Precautions: Fall Precaution Comments: Pt reports multiple falls since being d/c'd from hospital.  Restrictions Weight Bearing Restrictions: No    Mobility  Bed Mobility               General bed mobility comments: Pt in recliner upon entry  Transfers                 General transfer comment: Pt had just gotten to recliner, and refusing to attempt sit to stands. Was agreeable to ther ex, however.   Ambulation/Gait                 Stairs             Wheelchair Mobility    Modified Rankin (Stroke Patients Only)       Balance                                            Cognition Arousal/Alertness: Awake/alert Behavior During Therapy: WFL for tasks assessed/performed Overall Cognitive Status: Within Functional Limits for tasks assessed                                        Exercises General Exercises - Lower Extremity Ankle Circles/Pumps: AROM;Both;20 reps Quad Sets: AROM;Both;10 reps Gluteal Sets: AROM;Both;10 reps Long Arc Quad:  AROM;Both;10 reps;Seated Hip Flexion/Marching: AROM;Both;10 reps;Seated    General Comments        Pertinent Vitals/Pain Pain Assessment: Faces Faces Pain Scale: Hurts little more Pain Location: L knee  Pain Descriptors / Indicators: Aching;Grimacing;Guarding Pain Intervention(s): Limited activity within patient's tolerance;Monitored during session;Repositioned    Home Living                      Prior Function            PT Goals (current goals can now be found in the care plan section) Acute Rehab PT Goals Patient Stated Goal: to get stronger in rehab before going home PT Goal Formulation: With patient Time For Goal Achievement: 12/06/18 Potential to Achieve Goals: Good Progress towards PT goals: Progressing toward goals    Frequency    Min 2X/week      PT Plan Current plan remains appropriate    Co-evaluation              AM-PAC PT "6 Clicks" Mobility   Outcome Measure  Help needed turning from your back to your side while in a flat bed without using bedrails?: A Lot Help needed moving from lying on your back to sitting on the side of a flat bed without using bedrails?: A Lot Help needed moving to and from a bed to a chair (including a wheelchair)?: A Lot Help needed standing up from a chair using your arms (e.g., wheelchair or bedside chair)?: A Lot Help needed to walk in hospital room?: A Lot Help needed climbing 3-5 steps with a railing? : Total 6 Click Score: 11    End of Session Equipment Utilized During Treatment: Gait belt Activity Tolerance: Patient tolerated treatment well Patient left: in chair;with call bell/phone within reach;with chair alarm set Nurse Communication: Mobility status PT Visit Diagnosis: Unsteadiness on feet (R26.81);Difficulty in walking, not elsewhere classified (R26.2);Muscle weakness (generalized) (M62.81);History of falling (Z91.81);Repeated falls (R29.6);Pain Pain - Right/Left: Left Pain - part of body:  Knee     Time: [id number] PT Time Calculation (min) (ACUTE ONLY): 22 min  Charges:  $Therapeutic Exercise: 8-22 mins                     Gladys DammeBrittany Johnwesley Lederman, PT, DPT  Acute Rehabilitation Services  Pager: [phone] Office: [phone]    Lehman PromBrittany S Anisah Kuck 11/25/2018, 6:04 PM

## 2018-11-25 NOTE — Discharge Summary (Signed)
Name: [name] MRN: [id number] DOB: June 25, 1945 73 y.o. PCP: Paulina FusiSchultz, Douglas E, MD  Date of Admission: 11/21/2018  1:14 PM Date of Discharge:  Attending Physician: Burns SpainButcher, Elizabeth A, MD  Discharge Diagnosis: 1. Fall/left knee pain 2. Recent ischemic CVA s/p R endarterectomy  Discharge Medications: Allergies as of 11/26/2018   No Known Allergies     Medication List    TAKE these medications   alendronate 70 MG tablet Commonly known as: FOSAMAX Take 70 mg by mouth every Wednesday. Take with a full glass of water on an empty stomach.   aspirin 81 MG EC tablet Take 1 tablet (81 mg total) by mouth daily for 21 days.   atorvastatin 80 MG tablet Commonly known as: LIPITOR Take 80 mg by mouth at bedtime.   clopidogrel 75 MG tablet Commonly known as: PLAVIX Take 75 mg by mouth daily.   clotrimazole 1 % vaginal cream Commonly known as: GYNE-LOTRIMIN Place 1 Applicatorful vaginally at bedtime.   ezetimibe 10 MG tablet Commonly known as: ZETIA Take 10 mg by mouth daily.   gabapentin 300 MG capsule Commonly known as: NEURONTIN Take 300 mg by mouth at bedtime.   glimepiride 4 MG tablet Commonly known as: AMARYL Take 1 tablet (4 mg total) by mouth 2 (two) times daily with a meal.   hydrALAZINE 25 MG tablet Commonly known as: APRESOLINE Take 25 mg by mouth 3 (three) times daily.   insulin glargine 100 UNIT/ML injection Commonly known as: Lantus Inject 0.2 mLs (20 Units total) into the skin daily. What changed:   how much to take  when to take this   insulin lispro 100 UNIT/ML injection Commonly known as: HUMALOG Inject 0.03 mLs (3 Units total) into the skin 3 (three) times daily with meals. What changed: how much to take   lisinopril 40 MG tablet Commonly known as: ZESTRIL Take 0.5 tablets (20 mg total) by mouth daily.   nitroGLYCERIN 0.4 MG SL tablet Commonly known as: NITROSTAT Place 0.4 mg under the tongue every 5 (five) minutes as needed for  chest pain.   rOPINIRole 1 MG tablet Commonly known as: REQUIP Take 1 mg by mouth at bedtime.   sitaGLIPtin 100 MG tablet Commonly known as: JANUVIA Take 100 mg by mouth daily.   torsemide 100 MG tablet Commonly known as: DEMADEX Take 50 mg by mouth daily as needed (fluid/hand and feet swelling).      Disposition and follow-up:   Ms.[name] [facility] Good condition.  At the hospital follow up visit please address:  1.  Please follow up pain knee pain and mobility.  2.  Labs / imaging needed at time of follow-up: na  3.  Pending labs/ test needing follow-up: na  Follow-up Appointments: Follow-up Information    Cephus Shellinglark, Christopher J, MD Follow up in 2 week(s).   Specialty: Vascular Surgery Why: sent Contact information: [address] [place] [address] [phone]        Paulina FusiSchultz, Douglas E, MD Follow up.   Specialty: Internal Medicine Why: Follow up with your primary care doctor in 1 week. Contact information: [address] [place] [address] [phone]           Hospital Course by problem list: 1. Fall/left knee pain -patient presented after a fall secondary to left sided weakness from a stroke 5 days prior to presentation -patient unable to bear weight since -imaging from osh unremarkable and shows  no acute fractures  -knee arthrocentesis performed and synovial fluid consistent with CPPD -Pt with improved pain and mobility since arthrocentesis, still unable to bear weight -received steroid injection in left knee day of discharge  2. Recent ischemic CVA s/p R endarterectomy -pt had previously scheduled R endarterectomy when she presented for fall -underwent surgery as planned with no complications -patient cleared for discharge by vascular surgery on aspirin plavix for one month post op  -will follow up with them in 2 weeks  Discharge Vitals:   BP 132/75 (BP Location: Left Arm)    Pulse 76   Temp 98.2 F (36.8 C) (Oral)   Resp 19   Ht 5\' 3"  (1.6 m)   Wt 89.8 kg   SpO2 99%   BMI 35.07 kg/m   Pertinent Labs, Studies, and Procedures:  BMP Latest Ref Rng & Units 11/25/2018 11/24/2018 11/23/2018  Glucose 70 - 99 mg/dL 133(H) 156(H) 263(H)  BUN 8 - 23 mg/dL 16 21 18   Creatinine 0.44 - 1.00 mg/dL 0.64 0.70 0.93  Sodium 135 - 145 mmol/L 141 142 137  Potassium 3.5 - 5.1 mmol/L 3.5 3.8 4.0  Chloride 98 - 111 mmol/L 111 112(H) 107  CO2 22 - 32 mmol/L 23 20(L) 23  Calcium 8.9 - 10.3 mg/dL 8.2(L) 8.4(L) 8.1(L)   CBC Latest Ref Rng & Units 11/26/2018 11/25/2018 11/24/2018  WBC 4.0 - 10.5 K/uL 7.7 7.3 7.6  Hemoglobin 12.0 - 15.0 g/dL 9.4(L) 9.3(L) 9.8(L)  Hematocrit 36.0 - 46.0 % 28.7(L) 28.7(L) 31.0(L)  Platelets 150 - 400 K/uL 169 171 170   Discharge Instructions: Discharge Instructions    Diet - low sodium heart healthy   Complete by: As directed    Increase activity slowly   Complete by: As directed      Signed: Al Decant, MD 11/26/2018, 1:34 PM   Pager: [phone]

## 2018-11-25 NOTE — Progress Notes (Signed)
Inpatient Diabetes Program Recommendations  AACE/ADA: New Consensus Statement on Inpatient Glycemic Control (2015)  Target Ranges:  Prepandial:   less than 140 mg/dL      Peak postprandial:   less than 180 mg/dL (1-2 hours)      Critically ill patients:  140 - 180 mg/dL   Lab Results  Component Value Date   GLUCAP 176 (H) 11/25/2018   HGBA1C 10.4 (H) 11/16/2018    Review of Glycemic Control Results for [name], [name] (MRN [id number]) as of 11/25/2018 14:49  Ref. Range 11/24/2018 17:23 11/24/2018 20:59 11/25/2018 06:04 11/25/2018 12:09  Glucose-Capillary Latest Ref Range: 70 - 99 mg/dL 195 (H) 206 (H) 102 (H) 176 (H)   Diabetes history: Type 2 DM Outpatient Diabetes medications: Januvia 100 mg QD, Humalog 30 units TID, Lantus 40 units QAM, 45 units QPM, Amaryl 4 mg BID Current orders for Inpatient glycemic control: Novolog 3 units TID, Lantus 20 units QD, Novolog 0-9 units TID  Inpatient Diabetes Program Recommendations:    Spoke with patient regarding outpatient diabetes management.  Reviewed patient's current A1c of 10.4%. Explained what a A1c is and what it measures. Also reviewed goal A1c with patient, importance of good glucose control @ home, and blood sugar goals. Reviewed patho of DM, need for insulin, role of pancreas, vascular changes and comorbidites. Patient check blood sugar 3-4 times per day. Reports occasional lows; patient states, "It happens often and I feel like a zombie." Encouraged to continue checking, and reviewed when to call MD. Did review inpatient insulin needs compared to outpatient and encourage patient to follow up with PCP and discuss with discharging physician. May need to make adjustments at discharge given inpatient requirements and patient reports of lows at home. Patient aware of what foods to avoid, but reviewed carb counting.  Will attach endo list to discharge summary and place consult for dietitian per patient request.  Patient has no further questions at  this time.   Thanks, Bronson Curb, MSN, RNC-OB Diabetes Coordinator [phone] (8a-5p)

## 2018-11-25 NOTE — Progress Notes (Signed)
   Subjective:  [name] continues to complain of left knee pain located in the medial aspect of the knee. Pt reports continued decreased sensation in left side but states that it is improving. She is otherwise doing well and has no other acute complaints.  Objective:  Vital signs in last 24 hours: Vitals:   11/24/18 1229 11/24/18 1800 11/24/18 1940 11/25/18 0334  BP: 125/71 (!) 113/57 (!) 112/58 114/62  Pulse: 65  65 68  Resp: 18 15 (!) 22 (!) 23  Temp: 97.9 F (36.6 C)  98.2 F (36.8 C) 98.2 F (36.8 C)  TempSrc: Oral  Oral Oral  SpO2: 99% 100% 98% 95%  Weight:      Height:       General: Elderly female, in bed, no acute distress CV: Regular rate and rhythm without murmurs Pulm: Crackles in bases, bilaterally Neuro: Strength 4/5 left side, 5/5 right side, able to lift left leg off of bed; decreased sensation on left side compared to right; EOMI MSK: No left knee effusion appreciated; left knee warm compared to right; pinpoint pain on right medial knee  Telemetry: 2 runs of vtach, one 12 beats, another 14 beats; no change in VS   Labs: BMP Latest Ref Rng & Units 11/25/2018 11/24/2018 11/23/2018  Glucose 70 - 99 mg/dL 133(H) 156(H) 263(H)  BUN 8 - 23 mg/dL 16 21 18   Creatinine 0.44 - 1.00 mg/dL 0.64 0.70 0.93  Sodium 135 - 145 mmol/L 141 142 137  Potassium 3.5 - 5.1 mmol/L 3.5 3.8 4.0  Chloride 98 - 111 mmol/L 111 112(H) 107  CO2 22 - 32 mmol/L 23 20(L) 23  Calcium 8.9 - 10.3 mg/dL 8.2(L) 8.4(L) 8.1(L)   CBC Latest Ref Rng & Units 11/25/2018 11/24/2018 11/23/2018  WBC 4.0 - 10.5 K/uL 7.3 7.6 10.3  Hemoglobin 12.0 - 15.0 g/dL 9.3(L) 9.8(L) 9.6(L)  Hematocrit 36.0 - 46.0 % 28.7(L) 31.0(L) 29.6(L)  Platelets 150 - 400 K/uL 171 170 154     Assessment/Plan:  Active Problems:   Fall   Acute pain of left knee   # Fall # Deconditioning   Knee immobilizer placed yesterdayby ortho tech to help with ambulation.  Otherwise doing well and waiting for placement at a SNF.  #  Recent ischemic CVA s/p R endarterectomy: Her neurological exam is unchanged but patient reports improvement in sensation. She continues to work with PT and OT for strengthening. Medically stable for discharge per [facility].  # L knee pain: Much improved after arthrocentesis, with continued pinpoint medial L knee pain. Fluid culture no growth to date.  [place] today to evaluate for ligamentous injury/effusion. There is probably a chronic component to her pain also as imaging at OSH showed moderate arthritis. Continue PT/OT.   # Hypotension: Resolved.   Dispo: Pending SNF placement.   Al Decant, MD 11/25/2018, 8:11 AM Pager: [phone]

## 2018-11-25 NOTE — Progress Notes (Signed)
[facility]  Subjective  - No complaints.  No new neurologic symptoms.   Objective 114/62 68 98.2 F (36.8 C) (Oral) (!) 23 95%  Intake/Output Summary (Last 24 hours) at 11/25/2018 0800 Last data filed at 11/25/2018 0013 Gross per 24 hour  Intake 760 ml  Output 660 ml  Net 100 ml    Right neck incision with some bruising and small amount of fullness - all very soft still - no change from weekend - incision c/d/i Left grip strength weak as noted pre-op - 4/5 - able to raise left leg off bed today - in knee immobilizer  Laboratory Lab Results: Recent Labs    11/24/18 0821 11/25/18 0232  WBC 7.6 7.3  HGB 9.8* 9.3*  HCT 31.0* 28.7*  PLT 170 171   BMET Recent Labs    11/24/18 0821 11/25/18 0232  NA 142 141  K 3.8 3.5  CL 112* 111  CO2 20* 23  GLUCOSE 156* 133*  BUN 21 16  CREATININE 0.70 0.64  CALCIUM 8.4* 8.2*    COAG Lab Results  Component Value Date   INR 1.0 11/21/2018   No results found for: PTT  Assessment/Planning: POD #3 s/p right carotid endarterectomy.  Needs aspirin plavix for one month post-op.  OK for discharge from vascular standpoint.  Will arrange follow-up in our clinic in 3 weeks for wound check.  She's had left sided weakness since her stroke and likely impacted her fall that prompted this admission.  No new neurologic symptoms since carotid intervention.  Agree with SNF for ongoing recovery - states she has no help at home.    Marty Heck 11/25/2018 8:00 AM --

## 2018-11-25 NOTE — Discharge Instructions (Signed)
You were admitted to the hospital for knee pain after a fall. Imaging did not show any fractures but did show some fluid accumulation for which we performed an arthrocentesis. The fluid cultured from the arthrocentesis did not show an infection. You are being sent to a skilled nursing facility to regain your strength before going home. While you were here you also had your previously scheduled carotid endarterectomy surgery. You are healing well from that and will follow up with your vascular surgeon in around 2 weeks.

## 2018-11-26 DIAGNOSIS — Z20828 Contact with and (suspected) exposure to other viral communicable diseases: Secondary | ICD-10-CM | POA: Diagnosis not present

## 2018-11-26 DIAGNOSIS — G8190 Hemiplegia, unspecified affecting unspecified side: Secondary | ICD-10-CM | POA: Diagnosis not present

## 2018-11-26 DIAGNOSIS — D649 Anemia, unspecified: Secondary | ICD-10-CM | POA: Diagnosis not present

## 2018-11-26 DIAGNOSIS — M25469 Effusion, unspecified knee: Secondary | ICD-10-CM | POA: Diagnosis not present

## 2018-11-26 DIAGNOSIS — W19XXXD Unspecified fall, subsequent encounter: Secondary | ICD-10-CM | POA: Diagnosis not present

## 2018-11-26 DIAGNOSIS — I639 Cerebral infarction, unspecified: Secondary | ICD-10-CM | POA: Diagnosis not present

## 2018-11-26 DIAGNOSIS — G5790 Unspecified mononeuropathy of unspecified lower limb: Secondary | ICD-10-CM | POA: Diagnosis not present

## 2018-11-26 DIAGNOSIS — M25462 Effusion, left knee: Secondary | ICD-10-CM | POA: Diagnosis not present

## 2018-11-26 DIAGNOSIS — E1151 Type 2 diabetes mellitus with diabetic peripheral angiopathy without gangrene: Secondary | ICD-10-CM | POA: Diagnosis not present

## 2018-11-26 DIAGNOSIS — M25562 Pain in left knee: Secondary | ICD-10-CM | POA: Diagnosis not present

## 2018-11-26 DIAGNOSIS — Z794 Long term (current) use of insulin: Secondary | ICD-10-CM | POA: Diagnosis not present

## 2018-11-26 DIAGNOSIS — G2581 Restless legs syndrome: Secondary | ICD-10-CM | POA: Diagnosis not present

## 2018-11-26 DIAGNOSIS — I509 Heart failure, unspecified: Secondary | ICD-10-CM | POA: Diagnosis not present

## 2018-11-26 DIAGNOSIS — Z7902 Long term (current) use of antithrombotics/antiplatelets: Secondary | ICD-10-CM | POA: Diagnosis not present

## 2018-11-26 DIAGNOSIS — M25862 Other specified joint disorders, left knee: Secondary | ICD-10-CM | POA: Diagnosis not present

## 2018-11-26 DIAGNOSIS — Z79899 Other long term (current) drug therapy: Secondary | ICD-10-CM | POA: Diagnosis not present

## 2018-11-26 DIAGNOSIS — M2550 Pain in unspecified joint: Secondary | ICD-10-CM | POA: Diagnosis not present

## 2018-11-26 DIAGNOSIS — I10 Essential (primary) hypertension: Secondary | ICD-10-CM | POA: Diagnosis not present

## 2018-11-26 DIAGNOSIS — I2510 Atherosclerotic heart disease of native coronary artery without angina pectoris: Secondary | ICD-10-CM | POA: Diagnosis not present

## 2018-11-26 DIAGNOSIS — R531 Weakness: Secondary | ICD-10-CM | POA: Diagnosis not present

## 2018-11-26 DIAGNOSIS — I739 Peripheral vascular disease, unspecified: Secondary | ICD-10-CM | POA: Diagnosis not present

## 2018-11-26 DIAGNOSIS — J449 Chronic obstructive pulmonary disease, unspecified: Secondary | ICD-10-CM | POA: Diagnosis not present

## 2018-11-26 DIAGNOSIS — Z8673 Personal history of transient ischemic attack (TIA), and cerebral infarction without residual deficits: Secondary | ICD-10-CM | POA: Diagnosis not present

## 2018-11-26 DIAGNOSIS — Z48812 Encounter for surgical aftercare following surgery on the circulatory system: Secondary | ICD-10-CM | POA: Diagnosis not present

## 2018-11-26 DIAGNOSIS — Z741 Need for assistance with personal care: Secondary | ICD-10-CM | POA: Diagnosis not present

## 2018-11-26 DIAGNOSIS — Z7982 Long term (current) use of aspirin: Secondary | ICD-10-CM | POA: Diagnosis not present

## 2018-11-26 DIAGNOSIS — I679 Cerebrovascular disease, unspecified: Secondary | ICD-10-CM | POA: Diagnosis not present

## 2018-11-26 DIAGNOSIS — Z7401 Bed confinement status: Secondary | ICD-10-CM | POA: Diagnosis not present

## 2018-11-26 DIAGNOSIS — Z9181 History of falling: Secondary | ICD-10-CM | POA: Diagnosis not present

## 2018-11-26 DIAGNOSIS — I6521 Occlusion and stenosis of right carotid artery: Secondary | ICD-10-CM | POA: Diagnosis not present

## 2018-11-26 DIAGNOSIS — E785 Hyperlipidemia, unspecified: Secondary | ICD-10-CM | POA: Diagnosis not present

## 2018-11-26 DIAGNOSIS — I63231 Cerebral infarction due to unspecified occlusion or stenosis of right carotid arteries: Secondary | ICD-10-CM | POA: Diagnosis not present

## 2018-11-26 DIAGNOSIS — M810 Age-related osteoporosis without current pathological fracture: Secondary | ICD-10-CM | POA: Diagnosis not present

## 2018-11-26 DIAGNOSIS — L080 Pyoderma: Secondary | ICD-10-CM | POA: Diagnosis not present

## 2018-11-26 DIAGNOSIS — R262 Difficulty in walking, not elsewhere classified: Secondary | ICD-10-CM | POA: Diagnosis not present

## 2018-11-26 DIAGNOSIS — E1140 Type 2 diabetes mellitus with diabetic neuropathy, unspecified: Secondary | ICD-10-CM | POA: Diagnosis not present

## 2018-11-26 DIAGNOSIS — Z9889 Other specified postprocedural states: Secondary | ICD-10-CM | POA: Diagnosis not present

## 2018-11-26 LAB — CBC
HCT: 28.7 % — ABNORMAL LOW (ref 36.0–46.0)
Hemoglobin: 9.4 g/dL — ABNORMAL LOW (ref 12.0–15.0)
MCH: 29.3 pg (ref 26.0–34.0)
MCHC: 32.8 g/dL (ref 30.0–36.0)
MCV: 89.4 fL (ref 80.0–100.0)
Platelets: 169 10*3/uL (ref 150–400)
RBC: 3.21 MIL/uL — ABNORMAL LOW (ref 3.87–5.11)
RDW: 13.1 % (ref 11.5–15.5)
WBC: 7.7 10*3/uL (ref 4.0–10.5)
nRBC: 0 % (ref 0.0–0.2)

## 2018-11-26 LAB — GLUCOSE, CAPILLARY
Glucose-Capillary: 124 mg/dL — ABNORMAL HIGH (ref 70–99)
Glucose-Capillary: 180 mg/dL — ABNORMAL HIGH (ref 70–99)
Glucose-Capillary: 158 mg/dL — ABNORMAL HIGH (ref 70–99)

## 2018-11-26 MED ORDER — INSULIN GLARGINE 100 UNIT/ML ~~LOC~~ SOLN: 27.0000 [IU] | Freq: Every day | SUBCUTANEOUS | 0 refills | Status: DC

## 2018-11-26 MED ORDER — GABAPENTIN 300 MG PO CAPS: 300.0000 mg | ORAL_CAPSULE | Freq: Every day | ORAL | 0 refills | Status: DC

## 2018-11-26 MED ORDER — INSULIN LISPRO 100 UNIT/ML ~~LOC~~ SOLN: 3.0000 [IU] | Freq: Three times a day (TID) | SUBCUTANEOUS | 0 refills | Status: DC

## 2018-11-26 MED ORDER — INSULIN GLARGINE 100 UNIT/ML ~~LOC~~ SOLN: 20.0000 [IU] | Freq: Every day | SUBCUTANEOUS | 0 refills | Status: DC

## 2018-11-26 NOTE — Progress Notes (Signed)
MD on call made aware that Patient has a bed ready at SNF. Will monitor patient . Wendelyn Kiesling, Bettina Gavia RN

## 2018-11-26 NOTE — Progress Notes (Signed)
   Subjective:  [name] continues to complain of left knee pain located in the medial aspect of the knee. She reports pain with internal rotation but no pain with external rotation. She reports that her knee buckles whenever she tries to bear weight. She would like a steroid shot before discharge. She is otherwise doing well and has no other acute complaints.  Objective:  Vital signs in last 24 hours: Vitals:   11/25/18 1245 11/25/18 2000 11/26/18 0350 11/26/18 0848  BP: 130/78 (!) 150/86 136/69 132/75  Pulse: 77 74 76   Resp: 18 (!) 21 20 19   Temp: 97.9 F (36.6 C) 98.9 F (37.2 C) 98.2 F (36.8 C)   TempSrc: Oral Oral Oral   SpO2: 100% 99% 95% 99%  Weight:      Height:       General: Elderly female, in bed, no acute distress CV: Regular rate and rhythm without murmurs Pulm: Clear to auscultation bilaterally Neuro: Strength 4/5 left side, 5/5 right side, able to lift left leg off of bed; decreased sensation on left side compared to right; EOMI MSK: No left knee effusion appreciated; left knee warm compared to right; pinpoint pain on right medial knee   Labs: BMP Latest Ref Rng & Units 11/25/2018 11/24/2018 11/23/2018  Glucose 70 - 99 mg/dL 133(H) 156(H) 263(H)  BUN 8 - 23 mg/dL 16 21 18   Creatinine 0.44 - 1.00 mg/dL 0.64 0.70 0.93  Sodium 135 - 145 mmol/L 141 142 137  Potassium 3.5 - 5.1 mmol/L 3.5 3.8 4.0  Chloride 98 - 111 mmol/L 111 112(H) 107  CO2 22 - 32 mmol/L 23 20(L) 23  Calcium 8.9 - 10.3 mg/dL 8.2(L) 8.4(L) 8.1(L)   CBC Latest Ref Rng & Units 11/26/2018 11/25/2018 11/24/2018  WBC 4.0 - 10.5 K/uL 7.7 7.3 7.6  Hemoglobin 12.0 - 15.0 g/dL 9.4(L) 9.3(L) 9.8(L)  Hematocrit 36.0 - 46.0 % 28.7(L) 28.7(L) 31.0(L)  Platelets 150 - 400 K/uL 169 171 170    Assessment/Plan:  Active Problems:   Fall   Acute pain of left knee   # Fall # Deconditioning   Sensation and strength decreased since stroke but improving. Going to SNF today.  # Recent ischemic CVA s/p R  endarterectomy: Patient reports improved sensation. She continues to work with PT and OT for strengthening. Medically stable for discharge per VVS. SNF today.  # L knee pain: Much improved after arthrocentesis, with continued pinpoint medial L knee pain. Steroid shot today for CPPD found on synovial fluid analysis. There is probably a chronic component to her pain also as imaging at OSH showed moderate arthritis. POC [place] does not show any obvious meniscal or ligamentous abnormalities but if pain continues and patient unable to bear weight, an MRI may be indicated outpatient. Continue PT/OT. SNF today.  Dispo: SNF today.   Al Decant, MD 11/26/2018, 10:39 AM Pager: [phone]

## 2018-11-26 NOTE — Progress Notes (Signed)
Physical Therapy Treatment Patient Details Name: Haley RouteBetty L Gray MRN: [id number] DOB: 03/25/1946 Today's Date: 11/26/2018    History of Present Illness Pt is a 73 y/o female presenting after fall with L knee pain. Pt is s/p aspiration of fluid of L knee. Pt also with recent CVA with L sided deficits. Pt is s/p R carotid endarterectomy. PMH includes CHF, DM, HTN, and CVA.     PT Comments    Pt had steroid shot and ready for DC SNF.  I did left quad exercises with pt supine. I could tell she got stronger contraction - the more she did her exercises.  Pt happy to learn exercises to help leg get stronger and keep her safer in standing.  She has written exercises to take with her -s he can do these exercises on her own.  No increased knee pain.  Pt ready for DC SNF   Follow Up Recommendations  SNF;Supervision/Assistance - 24 hour     Equipment Recommendations  None recommended by PT    Recommendations for Other Services       Precautions / Restrictions Precautions Precautions: Fall Precaution Comments: Pt reports multiple falls since being d/c'd from hospital.  Restrictions Weight Bearing Restrictions: No Other Position/Activity Restrictions: pt has left knee immobilizer for getting up    Mobility  Bed Mobility Overal bed mobility: Needs Assistance Bed Mobility: Sit to Supine Rolling: Min assist         General bed mobility comments: Pt sitting EOB upon entry  Transfers                    Ambulation/Gait                 Stairs             Wheelchair Mobility    Modified Rankin (Stroke Patients Only)       Balance Overall balance assessment: Needs assistance Sitting-balance support: No upper extremity supported;Feet supported Sitting balance-Leahy Scale: Fair Sitting balance - Comments: Pt declining to reach forward and attempt threading BLE through underwear                                    Cognition Arousal/Alertness:  Awake/alert Behavior During Therapy: WFL for tasks assessed/performed Overall Cognitive Status: Within Functional Limits for tasks assessed                                 General Comments: Pt just had steroid injection and ready for DC soon.  we did some left knee /quad strenghtening exercises.      Exercises Other Exercises Other Exercises: left knee - supine - knee presses x 2 sets of 10 reps Other Exercises: left knee - supine - short arc quads x 2 sets of 10 reps Other Exercises: left knee - supine - straight leg raise x 2 sets of 10 reps.    General Comments General comments (skin integrity, edema, etc.): Removed pt from tele per NT instruction for d/c       Pertinent Vitals/Pain Pain Assessment: 0-10 Pain Score: 4  Pain Location: L knee  Pain Descriptors / Indicators: Aching;Guarding Pain Intervention(s): Limited activity within patient's tolerance;Monitored during session    Home Living  Prior Function            PT Goals (current goals can now be found in the care plan section) Acute Rehab PT Goals Patient Stated Goal: Stand one day Progress towards PT goals: Progressing toward goals    Frequency    Min 2X/week      PT Plan Current plan remains appropriate    Co-evaluation              AM-PAC PT "6 Clicks" Mobility   Outcome Measure  Help needed turning from your back to your side while in a flat bed without using bedrails?: A Lot Help needed moving from lying on your back to sitting on the side of a flat bed without using bedrails?: A Lot Help needed moving to and from a bed to a chair (including a wheelchair)?: A Lot Help needed standing up from a chair using your arms (e.g., wheelchair or bedside chair)?: A Lot Help needed to walk in hospital room?: A Lot Help needed climbing 3-5 steps with a railing? : Total 6 Click Score: 11    End of Session   Activity Tolerance: Patient tolerated treatment  well;No increased pain Patient left: in bed;with call bell/phone within reach   PT Visit Diagnosis: Unsteadiness on feet (R26.81);Difficulty in walking, not elsewhere classified (R26.2);Muscle weakness (generalized) (M62.81);History of falling (Z91.81);Repeated falls (R29.6);Pain Pain - Right/Left: Left Pain - part of body: Knee     Time: 8916-9450 PT Time Calculation (min) (ACUTE ONLY): 15 min  Charges:  $Therapeutic Exercise: 8-22 mins                     11/26/2018   Rande Lawman, PT    Loyal Buba 11/26/2018, 4:09 PM

## 2018-11-26 NOTE — TOC Progression Note (Signed)
Transition of Care [facility]) - Progression Note    Patient Details  Name: [name] MRN: [id number] Date of Birth: July 18, 1945  Transition of Care [facility]) CM/SW [place], [place] Phone Number: 11/26/2018, 12:54 PM  Clinical Narrative:     Patient will DC to: [facility]  DC Date: 11/26/2018 Family Notified: called patient's son- unable to leave voice message- patient is alert and oriented Transport By: Corey Harold   RN, patient, and facility notified of DC. Discharge Summary sent to facility. RN given number for [phone], Room 706.  Ambulance transport requested for patient.   Clinical Social Worker signing off. Thurmond Butts, MSW, [facility] Clinical Social Worker [phone]    Expected Discharge Plan: Skilled Nursing Facility Barriers to Discharge: Barriers Resolved  Expected Discharge Plan and Services Expected Discharge Plan: [place] In-house Referral: Clinical Social Work Discharge Planning Services: NA Post Acute Care Choice: [place] Living arrangements for the past 2 months: Single Family Home Expected Discharge Date: 11/25/18               DME Arranged: N/A         HH Arranged: NA           Social Determinants of Health (SDOH) Interventions    Readmission Risk Interventions No flowsheet data found.

## 2018-11-26 NOTE — Plan of Care (Signed)
  RD consulted for nutrition education regarding diabetes.   Lab Results  Component Value Date   HGBA1C 10.4 (H) 11/16/2018    RD provided "Carbohydrate Counting for People with Diabetes" handout from the Academy of Nutrition and Dietetics. Discussed different food groups and their effects on blood sugar, emphasizing carbohydrate-containing foods. Provided list of carbohydrates and recommended serving sizes of common foods.  Discussed importance of controlled and consistent carbohydrate intake throughout the day. Provided examples of ways to balance meals/snacks and encouraged intake of high-fiber, whole grain complex carbohydrates. Teach back method used.  Spoke with pt at bedside. Reports having issues with blood sugar over the last year while staying in SNF. She thinks it's due to her insulin not being dosed appropriately. States she has had multiple educations in the past and is followed by a dietitian in outpatient setting. Pt seemed very knowledgeable about counting carbohydrates, which foods contain carbs, and how to read a nutrition label. She request to have a meal plan sample handout. RD to provide in d/c instructions.   Expect fair compliance.  Body mass index is 35.07 kg/m. Pt meets criteria for obese based on current BMI.  Current diet order is carb modified/low sodium , patient is consuming approximately 90-100% of meals at this time. Labs and medications reviewed. No further nutrition interventions warranted at this time. RD contact information provided. If additional nutrition issues arise, please re-consult RD.  Mariana Single RD, LDN Clinical Nutrition Pager # [phone]

## 2018-11-26 NOTE — Progress Notes (Signed)
[facility] [place]  Subjective  - No complaints.  No new neurologic symptoms.   Objective 136/69 76 98.2 F (36.8 C) (Oral) 20 95%  Intake/Output Summary (Last 24 hours) at 11/26/2018 0813 Last data filed at 11/26/2018 0350 Gross per 24 hour  Intake 955 ml  Output 500 ml  Net 455 ml    Right neck incision with some bruising and small amount of fullness - all very soft still  Left grip strength weak as noted pre-op - 4/5   Laboratory Lab Results: Recent Labs    11/25/18 0232 11/26/18 0304  WBC 7.3 7.7  HGB 9.3* 9.4*  HCT 28.7* 28.7*  PLT 171 169   BMET Recent Labs    11/24/18 0821 11/25/18 0232  NA 142 141  K 3.8 3.5  CL 112* 111  CO2 20* 23  GLUCOSE 156* 133*  BUN 21 16  CREATININE 0.70 0.64  CALCIUM 8.4* 8.2*    COAG Lab Results  Component Value Date   INR 1.0 11/21/2018   No results found for: PTT  Assessment/Planning: POD #4 s/p right carotid endarterectomy.  Needs aspirin plavix for one month post-op.  OK for discharge from vascular standpoint.  Will arrange follow-up in our clinic in 3 weeks for wound check.  She's had left sided weakness since her stroke and likely impacted her fall that prompted this admission.  No new neurologic symptoms since carotid intervention.  Agree with SNF for ongoing recovery.  Call vascular with questions or concerns.  Marty Heck 11/26/2018 8:13 AM --

## 2018-11-26 NOTE — Progress Notes (Signed)
Report given to [facility]. Bambi Fehnel, Bettina Gavia RN

## 2018-11-26 NOTE — Progress Notes (Signed)
  Date: 11/26/2018  Patient name: Ringgold record number: [id number]  Date of birth: Feb 21, 1946   I have seen and evaluated this patient and I have discussed the plan of care with the house staff. Please see their note for complete details. I concur with their findings with the following additions/corrections: Ms Panameno was seen this morning on team rounds.  I agree with Dr. Webb Silversmith plan for a steroid injection of the left knee.  If she is unable to progress with PT at her SNF, then I agree with her plan to pursue an MRI to investigate meniscal or ligament injury.  Bartholomew Crews, MD 11/26/2018, 2:17 PM

## 2018-11-26 NOTE — Procedures (Signed)
Knee Injection Procedure Note  Pre-operative Diagnosis: left knee effusion with CPPD  Indications: Crystal induced arthropathy  Anesthesia: Lidocaine 1% without epinephrine without added sodium bicarbonate  Procedure Details   Point of care ultrasound was used to identify the left lateral suprapatellar bursa. Consent was obtained for the procedure. The knee was prepped with iodine and topical numbing spray. Using a 21 gauge needle, the suprapatellar bursa was injected with 2 mL 1% lidocaine and 40 mg of triamcinolone (KENALOG). The needle was removed and a dressing was applied.  Complications:  None; patient tolerated the procedure well.  Al Decant, MD 11/26/2018, 11:41 AM Pager: [phone]

## 2018-11-26 NOTE — Progress Notes (Signed)
Occupational Therapy Treatment Patient Details Name: [name] MRN: [id number] DOB: [birth year] Today's Date: 11/26/2018    History of present illness Pt is a 73 y/o female presenting after fall with L knee pain. Pt is s/p aspiration of fluid of L knee. Pt also with recent CVA with L sided deficits. Pt is s/p R carotid endarterectomy. PMH includes CHF, DM, HTN, and CVA.    OT comments  Pt still presenting with anxiety that limits her ability to participate in therapy. Max encouragement and cueing to attempt a sit > stand but pt stating "I can't, I know I can't". Pt donned LB clothing with max A, min A to don UB clothing. SNF remains appropriate d/c.    Follow Up Recommendations  Supervision/Assistance - 24 hour;SNF    Equipment Recommendations  None recommended by OT       Precautions / Restrictions Precautions Precautions: Fall Precaution Comments: Pt reports multiple falls since being d/c'd from hospital.  Restrictions Weight Bearing Restrictions: No       Mobility Bed Mobility Overal bed mobility: Needs Assistance Bed Mobility: Sit to Supine Rolling: Min assist         General bed mobility comments: Pt sitting EOB upon entry  Transfers                      Balance Overall balance assessment: Needs assistance Sitting-balance support: No upper extremity supported;Feet supported Sitting balance-Leahy Scale: Fair Sitting balance - Comments: Pt declining to reach forward and attempt threading BLE through underwear                                   ADL either performed or assessed with clinical judgement   ADL Overall ADL's : Needs assistance/impaired                 Upper Body Dressing : Minimal assistance;Sitting   Lower Body Dressing: Maximal assistance;Sitting/lateral leans                                 Cognition Arousal/Alertness: Awake/alert Behavior During Therapy: WFL for tasks assessed/performed Overall  Cognitive Status: Within Functional Limits for tasks assessed                                 General Comments: Poor insight, very anxious re falling and unwilling to even attempt a stand              General Comments Removed pt from tele per NT instruction for d/c     Pertinent Vitals/ Pain       Pain Assessment: No/denies pain     Prior Functioning/Environment              Frequency  Min 2X/week        Progress Toward Goals  OT Goals(current goals can now be found in the care plan section)  Progress towards OT goals: Progressing toward goals  Acute Rehab OT Goals Patient Stated Goal: Stand one day OT Goal Formulation: With patient Time For Goal Achievement: 12/07/18 Potential to Achieve Goals: Good  Plan Discharge plan remains appropriate       AM-PAC OT "6 Clicks" Daily Activity     Outcome Measure   Help from another person eating meals?: None Help  from another person taking care of personal grooming?: A Little Help from another person toileting, which includes using toliet, bedpan, or urinal?: A Little Help from another person bathing (including washing, rinsing, drying)?: A Lot Help from another person to put on and taking off regular upper body clothing?: A Little Help from another person to put on and taking off regular lower body clothing?: A Lot 6 Click Score: 17    End of Session    OT Visit Diagnosis: Pain;Muscle weakness (generalized) (M62.81);Unsteadiness on feet (R26.81)   Activity Tolerance Patient tolerated treatment well   Patient Left in bed;with call bell/phone within reach   Nurse Communication          Time: 4469-5072 OT Time Calculation (min): 20 min  Charges: OT General Charges $OT Visit: 1 Visit OT Treatments $Self Care/Home Management : 8-22 mins  Curtis Sites OTR/L  11/26/2018, 2:57 PM

## 2018-11-27 LAB — CULTURE, BODY FLUID W GRAM STAIN -BOTTLE: Culture: NO GROWTH

## 2018-11-28 DIAGNOSIS — I739 Peripheral vascular disease, unspecified: Secondary | ICD-10-CM | POA: Diagnosis not present

## 2018-11-28 DIAGNOSIS — E1151 Type 2 diabetes mellitus with diabetic peripheral angiopathy without gangrene: Secondary | ICD-10-CM | POA: Diagnosis not present

## 2018-11-28 DIAGNOSIS — R262 Difficulty in walking, not elsewhere classified: Secondary | ICD-10-CM | POA: Diagnosis not present

## 2018-11-28 DIAGNOSIS — I2510 Atherosclerotic heart disease of native coronary artery without angina pectoris: Secondary | ICD-10-CM | POA: Diagnosis not present

## 2018-12-04 ENCOUNTER — Other Ambulatory Visit: Payer: Self-pay | Admitting: *Deleted

## 2018-12-04 NOTE — Patient Outreach (Signed)
Member assessed for potential [facility] Care Management needs as a benefit of  [place] Medicare.  Member is currently receiving rehab therapy at [facility].  Member discussed in weekly telephonic IDT meeting with facility staff, [facility] UM team, and writer.  Facility reports member has limited support at home. Not clear disposition plan at this time. Discussed that writer will plan on outreaching closer to SNF discharge to discuss [place] Management services..  Will continue to collaborate with [facility] UM team and facility staff on member.    [name], MSN-Ed, RN,BSN [place] Acute Care Coordinator [phone] [facility]) [phone]  (Toll free office)

## 2018-12-11 ENCOUNTER — Other Ambulatory Visit: Payer: Self-pay | Admitting: *Deleted

## 2018-12-11 NOTE — Patient Outreach (Signed)
Member assessed for potential [facility] Care Management needs as a benefit of  [place] Medicare.  Member is currently receiving rehab therapy at [facility].  Member discussed in weekly telephonic IDT meeting with facility staff, [facility] UM team, and writer.  Facility reports Mrs. Bruneau has requested to discharge home on this Saturday.   Writer will plan to outreach to Mrs. Mangione prior to SNF discharge to discuss [place] Management services.    [name], MSN-Ed, RN,BSN [place] Acute Care Coordinator [phone] [facility]) [phone]  (Toll free office)

## 2018-12-12 ENCOUNTER — Other Ambulatory Visit: Payer: Self-pay | Admitting: *Deleted

## 2018-12-12 NOTE — Patient Outreach (Signed)
Member assessed for potential [facility] Care Management needs as a benefit of [place] Medicare.  Mrs. Vastine remains at [facility] SNF receiving rehab therapy. Facility reported in telephonic IDT meeting that Mrs. Loconte will discharge home on Saturday, per her request.  Telephone call to telephone number on file (appeared to be mobile) at [phone]. However, it appears to be a home number in which her husband answered. Mr. Poland reports member will return home on Saturday. He states Mrs. Sweatt has a cell phone "that is off most of the time."  Writer will follow up at the beginning of the week to discuss [place] Management services with Mrs. Gause.  Notification sent to facility discharge planner of attempt to reach member. Asked if facility discharge planner could please make Mrs. Stebner aware that writer will be contacting her regarding [place] Management services. Member is a high risk for readmission.   [name], MSN-Ed, RN,BSN [place] Acute Care Coordinator [phone] [facility]) [phone]  (Toll free office)

## 2018-12-15 DIAGNOSIS — I110 Hypertensive heart disease with heart failure: Secondary | ICD-10-CM | POA: Diagnosis not present

## 2018-12-15 DIAGNOSIS — I6521 Occlusion and stenosis of right carotid artery: Secondary | ICD-10-CM | POA: Diagnosis not present

## 2018-12-15 DIAGNOSIS — I69354 Hemiplegia and hemiparesis following cerebral infarction affecting left non-dominant side: Secondary | ICD-10-CM | POA: Diagnosis not present

## 2018-12-15 DIAGNOSIS — I2510 Atherosclerotic heart disease of native coronary artery without angina pectoris: Secondary | ICD-10-CM | POA: Diagnosis not present

## 2018-12-15 DIAGNOSIS — M810 Age-related osteoporosis without current pathological fracture: Secondary | ICD-10-CM | POA: Diagnosis not present

## 2018-12-15 DIAGNOSIS — E1142 Type 2 diabetes mellitus with diabetic polyneuropathy: Secondary | ICD-10-CM | POA: Diagnosis not present

## 2018-12-16 ENCOUNTER — Other Ambulatory Visit: Payer: Self-pay | Admitting: *Deleted

## 2018-12-16 DIAGNOSIS — E1142 Type 2 diabetes mellitus with diabetic polyneuropathy: Secondary | ICD-10-CM | POA: Diagnosis not present

## 2018-12-16 DIAGNOSIS — I6521 Occlusion and stenosis of right carotid artery: Secondary | ICD-10-CM | POA: Diagnosis not present

## 2018-12-16 DIAGNOSIS — I110 Hypertensive heart disease with heart failure: Secondary | ICD-10-CM | POA: Diagnosis not present

## 2018-12-16 DIAGNOSIS — I2510 Atherosclerotic heart disease of native coronary artery without angina pectoris: Secondary | ICD-10-CM | POA: Diagnosis not present

## 2018-12-16 DIAGNOSIS — I69354 Hemiplegia and hemiparesis following cerebral infarction affecting left non-dominant side: Secondary | ICD-10-CM | POA: Diagnosis not present

## 2018-12-16 DIAGNOSIS — I10 Essential (primary) hypertension: Secondary | ICD-10-CM

## 2018-12-16 DIAGNOSIS — M810 Age-related osteoporosis without current pathological fracture: Secondary | ICD-10-CM | POA: Diagnosis not present

## 2018-12-16 NOTE — Patient Outreach (Signed)
[facility] Care Management  12/16/2018  [name] 05/07/1946 [id number]   Transition of care    Referral received : 8/10 [facility]  Referral reason . Transition of care post discharge from [facility] on 12/14/18   Patient is a 73 year old female with recent history of CVA , [facility], she still had some  left side weakness she had a  planned hospital return for Right CEA on 7/17. On 7/16 patient was readmitted after having a fall at home with her knee suddenly giving out  complaint of left knee pain, s/p aspiration of fluid left knee.   PMHx: includes , Diabetes , Hypertension , CVA , CHF.    Subjective  Successful initial outreach call to patient, HIPAA confirmed x2 identifiers;explained reason for the call and [facility] .  Patient discussed receiving a recent call from someone from my same agency, discussed role of post acute coordinator that recently spoke with. [facility] is [facility].  Conditions  CVA  Patient discussed feeling okay today , she discussed her history of having strokes in the past. She states that she has left sided weakness and continues to use a walker at home.  Reviewed signs symptoms of stroke and action plan  Diabetes She is monitoring her blood sugars at least twice since being at home  this morning blood sugar was 98 , unable to report reading from yesterday, she report taking Lantus daily. She discussed taking her meter when she sees Dr.Schultz about every 3 months , stating her levels has been higher lately,(Hgb A1c 10.4 on 7/11)  noted  Patient reports tolerating diet well, denies having any low blood sugar readings since being home, review of symptoms of low blood sugar and how to treat.She states she may need some help getting diabetes  under control  Heart failure Patient denies any increase in swelling , shortness of breath , she has not tried weighing  at home, due to balance , review of signs of worsening heart failure and notifying    Social  Patient reports not being able to sleep in the bed due to problems with her left hip states she needs a replacement but that will have to wait a while. She reports sleeping in recliner chair. She uses a rolling walker and able to walk to bathroom she uses bedside commode over there commode.  She reports living at home with her spouse that is able to assist her with some care, meal preparation. She reports being able to complete her own sponge bath, she reports not being able to get into tub/shower her spouse with help of family plans to build a walk in shower. Discussed other shower chair/bench options that may benefit her she reports she wants to hold off for now.  Patient discussed Home health agency , [facility] home health visited on Sunday and physical therapy to visit on today.    Medications Patient denies being able to review medication list as this time, she reports continuing to take medications as prescribed at discharged from  rehab using blister packaging she has left over at discharge and then she will, start taking her own supply,  she is unable to state at this time how much insulin she takes.    Appointments   Patient has post discharge visit Dr. Chestine Sporelark, Vascular surgeon on 8/11,  Dr. Tomasa BlaseSchultz on 8/20.   Plan Will send PCP barrier involvement  letter  Will send patient welcome letter,  [facility] calendar ,send EMMI on preventing stroke , send Diabetes Education book.  Will plan weekly transition of care outreaches , next call in a week for further education and assessment of care needs.  Will plan follow up call in the next day regarding how she takes her insulin and medication review.  Reviewed signs symptoms of stroke and seeking urgent medical help.   [facility] CM Care Plan Problem One     Most Recent Value  Care Plan Problem One  At risk for readmission related  recent hospital and rehab stay  due to CVA,fall and Right CEA   Role Documenting the Problem One  Care Management [place] for Problem One  Active  [facility]N Long Term Goal   Patient will not experince a hospital readmission over the next 31 days   [facility]N Long Term Goal Start Date  12/16/18  Interventions for Problem One Long Term Goal  Reviewed recent discharge, advised regarding taking medications as prescribed, notfifying MD of new symptoms/concerns related to stroke symptoms reviewed symptoms of stroke with patient. ,   [facility] CM Short Term Goal #1   Patient will attend all medical appointments over the next 14 days as evidenced by report   THN CM Short Term Goal #1 Start Date  12/16/18  Interventions for Short Term Goal #1  Reviewed upcoming appointments with PCP and vascular doctor, confirmed transportation by spouse and nephew .   THN CM Short Term Goal #2   Patient will be able to report no falls over the next 30 days    THN CM Short Term Goal #2 Start Date  12/16/18  Interventions for Short Term Goal #2  Reviewed with patient fall prevention measures, discussed , keeping frequent used items nearby, use walker at all times, participate in home health therapy sessions and exercise plans.       [name], RN, [place] Management Coordinator  [phone]- Mobile [phone]- Toll Free Main Office

## 2018-12-16 NOTE — Patient Outreach (Signed)
Member assessed for potential [facility] Care Management needs as a benefit of [place] Medicare.  Confirmed in Patient [name] that [name] discharged form [facility] on 12/14/18.  Telephone call made to [name] at [phone]. Patient identifiers confirmed. Explained [place] Management program. She is agreeable and verbal consent obtained.   Explained [facility] Care Management will not interfere or replace services provided by home health. She has [facility] Also explained [facility] services will be telephonic due to the current pandemic. [name] expressed understanding.  [name] confirms she lives with her husband who does most of the cooking. She denies concerns with transportation or with medication. States her blood sugars fluctuate. Discussed [facility] CM follow up will included DM education and management.  Confirms Primary Care appointment is next Tuesday with Dr. Carolanne Grumbling.   States she sometimes has memory issues but she states "it is getting better."  Confirmed best contact number is home number at [phone]. States her cell number is [phone]. However, she states her cell phone is hardly turned on.  [name] expressed appreciation of the call. Will make referral to [facility] for complex case management.   [name] has a medical history of DM (latest Hgb A1c 10.4), recent CVA, recent carotid endarterectomy, neuropathy, CHF, HTN, HLD. High risk for readmission.   [name], MSN-Ed, RN,BSN [place] Acute Care Coordinator [phone] [facility]) [phone]  (Toll free office)

## 2018-12-17 ENCOUNTER — Encounter: Payer: Self-pay | Admitting: Vascular Surgery

## 2018-12-17 ENCOUNTER — Ambulatory Visit: Payer: Medicare Other | Admitting: Vascular Surgery

## 2018-12-17 ENCOUNTER — Other Ambulatory Visit: Payer: Self-pay

## 2018-12-17 ENCOUNTER — Other Ambulatory Visit: Payer: Self-pay | Admitting: *Deleted

## 2018-12-17 ENCOUNTER — Ambulatory Visit (INDEPENDENT_AMBULATORY_CARE_PROVIDER_SITE_OTHER): Payer: Self-pay | Admitting: Vascular Surgery

## 2018-12-17 VITALS — BP 110/64 | HR 94 | Temp 97.5°F | Ht 63.0 in | Wt 189.0 lb

## 2018-12-17 DIAGNOSIS — I2510 Atherosclerotic heart disease of native coronary artery without angina pectoris: Secondary | ICD-10-CM | POA: Diagnosis not present

## 2018-12-17 DIAGNOSIS — I6521 Occlusion and stenosis of right carotid artery: Secondary | ICD-10-CM | POA: Diagnosis not present

## 2018-12-17 DIAGNOSIS — M810 Age-related osteoporosis without current pathological fracture: Secondary | ICD-10-CM | POA: Diagnosis not present

## 2018-12-17 DIAGNOSIS — I69354 Hemiplegia and hemiparesis following cerebral infarction affecting left non-dominant side: Secondary | ICD-10-CM | POA: Diagnosis not present

## 2018-12-17 DIAGNOSIS — E1142 Type 2 diabetes mellitus with diabetic polyneuropathy: Secondary | ICD-10-CM | POA: Diagnosis not present

## 2018-12-17 DIAGNOSIS — I110 Hypertensive heart disease with heart failure: Secondary | ICD-10-CM | POA: Diagnosis not present

## 2018-12-17 NOTE — Patient Outreach (Signed)
[facility]) Care Management  12/17/2018  [name] [birth year] [id number]    Telephone follow up call   Referral received : 8/10 Referral source: [facility] post acute coordinator  Referral reason . Transition of care post discharge from [facility] on 12/14/18   Patient is a 73 year old female with recent history of CVA , received TPA at [facility], she still had some  left side weakness she had a  planned hospital return for Right CEA on 7/17. On 7/16 patient was readmitted after having a fall at home with her knee suddenly giving out  complaint of left knee pain, s/p aspiration of fluid left knee.   PMHx: includes , Diabetes , Hypertension , CVA , CHF.    Successful outreach call to patient for follow up on medication review .  Discussed with patient medications she reports that she now using her medications from her pill box before discharge. Patient is unable to review each medication at this time.  She did discuss taking Lantus insulin 20 units this morning at 8:30 along with 15 units of novolog. She reports blood sugar being 102 this morning at breakfast.  Patient states that she has a bottle of insulin and draws it up, states that she never used the insulin pen systems.Patient denies difficulty with vision with drawing up insulin.   Discussed with patient prior insulin dosage of 3 units at meals at her recent hospital discharge, she states that she got mixed. She voiced not taking that much insulin on yesterday,and states that  she was taking 3 units as prior to discharge.  She reports eating cereal, toast with jelly this morning, requested that patient check her sugar, she reports reading of 58, and "states no wonder I feel a little funny", reports skin being warm dry, denies feeling shaky just funny. Advised patient to drink pepsi that she has at home and recheck blood sugar in 15 minutes. Patient states that she is not sure why she took 15 units she recall taking 3 units of  novolog while in hospital.  Patient discussed prior to last admission she was on higher doses of insulin, Lantus and novolog, discussed with patient importance of using most updated medication list. She discussed that she is not able to find a copy medication list .   1215 Return call to patient she reports that her blood sugar is 92 and she reports feeling better. Encouraged patient to eat her lunch at this time.  Placed call to [place] able to speak with nurse Nevin Bloodgood to report patient insulin dosages that she took on today, 20 Lantus, and 15 units of novolog. Reported low blood sugar reading of 58, up to 92 after treatment. Discussed patient being unsure of insulin dosages, unable to find her medication list, but she recalls taking 20 units of Lantus and 3 units of novolog at meals at her last discharge,  she is unsure why she took 15 units of novolog. Discussed differences in Lantus and novolgo and how long they work , she is understands Lantus of longer acting insulin and novolog works faster and she takes it at meals. Discussed [place] home health nurse  has visited since discharge and looked at her medications.     1240 Return call to patient to discuss communication with Dr.Schultz, patient insistent on attending appointment with Dr. Carlis Abbott on this afternoon, stating she has a ride and she is going , discussed concern regarding low blood sugar reading on today discussed rescheduling  to a different time, she still says that she is going, and will be fine. She reports eating a sandwich for lunch  and her blood sugar is 119 now she reports feeling better, denies being jittery, shaky , sweaty or weakness.  Patient reports having glucose tablets and will take them with her . Reminded patient of call to MD office and attempting   1500  Return call from [place] verifying insulin dose Lantus 20 units daily  and novolog 3 units 3 times day with meals. She will also discuss sooner visit with Dr.  Tomasa BlaseSchultz 1700 .  Attempted return call to patient mobile number , voice mail not set up unable to leave a message.    Plan Will plan return call to patient in the next business day for follow up and medication review and plans to arrange sooner visit to PCP.  Will place collaboration call to  home health regarding current medication list they have and planned home visit with patient.  Reinforced with patient , signs of hypoglycemia and how to treat.   Egbert GaribaldiKimberly , RN, [facility]CCN [facility] Care Management,Care Management Coordinator  [phone]- Mobile [phone] Toll Free Main Office

## 2018-12-17 NOTE — Progress Notes (Signed)
Patient name: [name] MRN: [id number] DOB: 1945-10-25 Sex: female  REASON FOR VISIT: Postop check status post right carotid endarterectomy  HPI: [name] is a 73 y.o. female the presents for postop check status post right carotid enterectomy after symptomatic high-grade carotid stenosis on 11/22/2018.  Overall her right neck incision is healed without issue.  She is swallowing without any issue.  She does have a little bit of numbness under her right chin.  She has been in nursing facility where she is recovering.  States she still has some weakness to her left arm and left leg as noted previously.  This morning apparently had an episode of slurred speech with some right arm weakness that resolved very quickly.  There was some concern that she overdosed on her Humalog at the nursing center.  Neuro intact on presentation to clinic.  Past Medical History:  Diagnosis Date  . CHF (congestive heart failure) ([place])   . Diabetes mellitus without complication ([place])   . Hyperlipidemia   . Hypertension   . Stroke [facility])     Past Surgical History:  Procedure Laterality Date  . ABDOMINAL HYSTERECTOMY    . APPENDECTOMY    . CARDIAC CATHETERIZATION    . ENDARTERECTOMY Right 11/22/2018   Procedure: ENDARTERECTOMY CAROTID RIGHT;  Surgeon: Marty Heck, MD;  Location: [place];  Service: Vascular;  Laterality: Right;  . KNEE ARTHROCENTESIS  11/22/2018      . PATCH ANGIOPLASTY Right 11/22/2018   Procedure: Patch Angioplasty Right Carotid Artery using Xenosure Biologic Patch;  Surgeon: Marty Heck, MD;  Location: [facility] OR;  Service: Vascular;  Laterality: Right;    Family History  Problem Relation Age of Onset  . Stroke Mother   . Prostate cancer Father   . CAD Sister   . CAD Sister   . Valvular heart disease Sister     SOCIAL HISTORY: Social History   Tobacco Use  . Smoking status: Never Smoker  . Smokeless tobacco: Never Used  Substance Use Topics  . Alcohol use: Not  Currently    No Known Allergies  Current Outpatient Medications  Medication Sig Dispense Refill  . alendronate (FOSAMAX) 70 MG tablet Take 70 mg by mouth every Wednesday. Take with a full glass of water on an empty stomach.    Marland Kitchen atorvastatin (LIPITOR) 80 MG tablet Take 80 mg by mouth at bedtime.     . clopidogrel (PLAVIX) 75 MG tablet Take 75 mg by mouth daily.     . clotrimazole (GYNE-LOTRIMIN) 1 % vaginal cream Place 1 Applicatorful vaginally at bedtime. 45 g 0  . ezetimibe (ZETIA) 10 MG tablet Take 10 mg by mouth daily.     Marland Kitchen gabapentin (NEURONTIN) 300 MG capsule Take 1 capsule (300 mg total) by mouth at bedtime. 30 capsule 0  . glimepiride (AMARYL) 4 MG tablet Take 1 tablet (4 mg total) by mouth 2 (two) times daily with a meal. 60 tablet 2  . hydrALAZINE (APRESOLINE) 25 MG tablet Take 25 mg by mouth 3 (three) times daily.    . insulin glargine (LANTUS) 100 UNIT/ML injection Inject 0.2 mLs (20 Units total) into the skin daily. 10 mL 0  . insulin lispro (HUMALOG) 100 UNIT/ML injection Inject 0.03 mLs (3 Units total) into the skin 3 (three) times daily with meals. 10 mL 0  . lisinopril (ZESTRIL) 40 MG tablet Take 0.5 tablets (20 mg total) by mouth daily.    . nitroGLYCERIN (NITROSTAT) 0.4 MG SL tablet  Place 0.4 mg under the tongue every 5 (five) minutes as needed for chest pain.     Marland Kitchen. rOPINIRole (REQUIP) 1 MG tablet Take 1 mg by mouth at bedtime.     . sitaGLIPtin (JANUVIA) 100 MG tablet Take 100 mg by mouth daily.    Marland Kitchen. torsemide (DEMADEX) 100 MG tablet Take 50 mg by mouth daily as needed (fluid/hand and feet swelling).     No current facility-administered medications for this visit.     REVIEW OF SYSTEMS:  [X]  denotes positive finding, [ ]  denotes negative finding Cardiac  Comments:  Chest pain or chest pressure:    Shortness of breath upon exertion:    Short of breath when lying flat:    Irregular heart rhythm:        Vascular    Pain in calf, thigh, or hip brought on by  ambulation:    Pain in feet at night that wakes you up from your sleep:     Blood clot in your veins:    Leg swelling:         Pulmonary    Oxygen at home:    Productive cough:     Wheezing:         Neurologic    Sudden weakness in arms or legs:     Sudden numbness in arms or legs:     Sudden onset of difficulty speaking or slurred speech:    Temporary loss of vision in one eye:     Problems with dizziness:         Gastrointestinal    Blood in stool:     Vomited blood:         Genitourinary    Burning when urinating:     Blood in urine:        Psychiatric    Major depression:         Hematologic    Bleeding problems:    Problems with blood clotting too easily:        Skin    Rashes or ulcers:        Constitutional    Fever or chills:      PHYSICAL EXAM: Vitals:   12/17/18 1543 12/17/18 1546  BP: 119/65 110/64  Pulse: 94 94  Temp: (!) 97.5 F (36.4 C)   TempSrc: Temporal   SpO2: 100%   Weight: 189 lb (85.7 kg)   Height: 5\' 3"  (1.6 m)     GENERAL: The patient is a well-nourished female, in no acute distress. The vital signs are documented above. CARDIAC: There is a regular rate and rhythm.  VASCULAR:  Right neck incision healing without issue Neuro: CN II-XII grossly intact Left arm and leg motor strength 4/5 - baseline to pre-op Right side arm and motor strength intact    DATA:   None  Assessment/Plan:  73 year old female presents for postop check status post right carotid endarterectomy.  Right neck incision has healed without issue and all of her cranial nerves appear to be intact.  She has left upper and lower strength of 4 out of 5 consistent with her preop exam.  I am not sure what to make of this episode from this morning where she reports an episode of slurred speech with some right arm weakness given this would be expected from a left carotid lesion which is opposite of where we operated.  I reviewed her old imaging and her CTA shows no  significant left carotid disease.  Her  previous MRI showed only right basal ganglia infarct.  She is completely neuro intact in clinic otherwise except for her left-sided weakness which is again at her pre-op baseline.  Discussed that if this happens again she would need to go to the ER for evaluation.  She is set up to see neurology soon.  I will see her in 9 months with carotid duplex for ongoing surveillance.   Cephus Shellinghristopher J. Mikenna Bunkley, MD [facility] [place] [place] Office: [phone] Pager: [phone]

## 2018-12-18 ENCOUNTER — Other Ambulatory Visit: Payer: Self-pay | Admitting: *Deleted

## 2018-12-18 ENCOUNTER — Encounter: Payer: Self-pay | Admitting: *Deleted

## 2018-12-18 DIAGNOSIS — I110 Hypertensive heart disease with heart failure: Secondary | ICD-10-CM | POA: Diagnosis not present

## 2018-12-18 DIAGNOSIS — E1142 Type 2 diabetes mellitus with diabetic polyneuropathy: Secondary | ICD-10-CM | POA: Diagnosis not present

## 2018-12-18 DIAGNOSIS — M810 Age-related osteoporosis without current pathological fracture: Secondary | ICD-10-CM | POA: Diagnosis not present

## 2018-12-18 DIAGNOSIS — I6521 Occlusion and stenosis of right carotid artery: Secondary | ICD-10-CM | POA: Diagnosis not present

## 2018-12-18 DIAGNOSIS — I2510 Atherosclerotic heart disease of native coronary artery without angina pectoris: Secondary | ICD-10-CM | POA: Diagnosis not present

## 2018-12-18 DIAGNOSIS — I69354 Hemiplegia and hemiparesis following cerebral infarction affecting left non-dominant side: Secondary | ICD-10-CM | POA: Diagnosis not present

## 2018-12-18 NOTE — Patient Outreach (Signed)
[facility]) Care Management  12/18/2018  [name] 1945-05-17 [id number]   Telephone follow up call     Subjective Successful outreach call to patient on today, she reports feeling much better than on yesterday. Discussed with patient follow up call from [place] office nurse yesterday, to continue Lantus 20 units once daily and Novolog 3 units, 3 times daily with meals, patient voiced understanding, able to repeat back.    Patient discussed her follow up visit to [facility] on yesterday and from surgery standpoint everything looked good her incision healing well.  Patient shared that yesterday on her way up to appointment, she started to feel funny again and had some numbness on her right arm, chin area, she checked her blood sugar and it was 48 she reports taking 3 glucose tablet and getting her sugar back to 68. She discussed making Dr.Clark aware to symptoms that she felt on yesterday, that resolved after getting her blood sugar back up.  Patient reports eating meal from [employer] after that blood sugar over 100. She states not taking any insulin on last night.  Discussed with patient if she had any difficulty vision and drawing up insulin since her stroke, she states not having difficulty with being able to see when drawing up insulin, she is able to see the lines on the syringe, she does state since stroke with foggy vision in corner of left eye, she insist that did not interfer with her drawing up insulin, stating she just took more novolog than she should have.   Patient states that she has scheduled follow up visit with Neurology, visit in next month.   Patient discussed planning order a blood pressure monitor to check her readings at home.  She reports continuing to weigh daily at home, 181 today, no sudden increases in weight, she is able state when notify MD of sudden weight gain action plan, taking her torsemide as needed for swelling and notifying MD>    Patient reports that her blood sugar was 97 on this morning and she did not take any insulin on this morning. I discussed calling Dr.Schultz office for follow up on insulin plan, patient states no she wants to wait until her home health nurse [place] visits with her and she will MD office.   Medications Patient was recently discharged from hospital and all medications have been reviewed. Patient reports not taking torsemide at this time as she does not have swelling, she uses as needed.  .   Care Coordination  Placed call to [place] home health to collaborate with [name] RN prior to her visit on today, agency transferred my call to her phone,  no answer to her phone able to leave a HIPAA compliant message for return call.  1430  Return call from [facility] explained concern regarding patient low blood sugar readings on yesterday and understanding of current insulin plan. Discussed that patient has not taken insulin yet today.  [name] discussing having being involved with patient several times after hospitalization and reviewed with [name] patient states  having a very trusting relationship with her .  She discussed plan to review insulin she is taking at home and contact MD for follow up on insulin plan, she will also observe patient drawing up insulin. She discussed patient history of diabetes blood sugar control .  Placed call to PCP office for sooner post hospital  appointment with Dr. Delena Bali, appointment available for 8/14 at 2 pm, instead of  8/21, confirmed with patient and she is agreeable and will have transportation .   Marland Kitchen.   Plan Will plan follow up call in the next week as part of transition of care .  Reinterated with patient symptoms of stroke and seeking emergency help. Reviewed monitoring blood sugar, Low blood sugar symptoms and treatment plan, including notifying MD of episode continued  Egbert GaribaldiKimberly Glover, RN, [facility] Care Management,Care Management  Coordinator  [phone]- Mobile [phone] Toll Free Main Office

## 2018-12-19 ENCOUNTER — Other Ambulatory Visit: Payer: Self-pay | Admitting: *Deleted

## 2018-12-19 DIAGNOSIS — E1165 Type 2 diabetes mellitus with hyperglycemia: Secondary | ICD-10-CM | POA: Diagnosis not present

## 2018-12-19 DIAGNOSIS — E1140 Type 2 diabetes mellitus with diabetic neuropathy, unspecified: Secondary | ICD-10-CM | POA: Diagnosis not present

## 2018-12-19 DIAGNOSIS — I6521 Occlusion and stenosis of right carotid artery: Secondary | ICD-10-CM | POA: Diagnosis not present

## 2018-12-19 DIAGNOSIS — I639 Cerebral infarction, unspecified: Secondary | ICD-10-CM | POA: Diagnosis not present

## 2018-12-19 NOTE — Patient Outreach (Signed)
[facility]) Care Management  12/19/2018  NAYDA RIESEN 1946-02-27 [id number]   EMMI red alert follow up  Question : Lost interest in thing? , Patient answer : Yes Question : Sad, hopeless, anxious, empty? Patient answer : Yes   Subjective Outreach call to patient , no answer able to leave a HIPAA compliant message for return call.   Plan Will await return call if no response will plan return call within the next 3 to 4  business days.   [name], RN, [place] Management Coordinator  [phone]- Mobile [phone]- Toll Free Main Office

## 2018-12-20 ENCOUNTER — Encounter: Payer: Self-pay | Admitting: *Deleted

## 2018-12-20 ENCOUNTER — Other Ambulatory Visit: Payer: Self-pay | Admitting: *Deleted

## 2018-12-20 DIAGNOSIS — E1142 Type 2 diabetes mellitus with diabetic polyneuropathy: Secondary | ICD-10-CM | POA: Diagnosis not present

## 2018-12-20 DIAGNOSIS — I69354 Hemiplegia and hemiparesis following cerebral infarction affecting left non-dominant side: Secondary | ICD-10-CM | POA: Diagnosis not present

## 2018-12-20 DIAGNOSIS — I2510 Atherosclerotic heart disease of native coronary artery without angina pectoris: Secondary | ICD-10-CM | POA: Diagnosis not present

## 2018-12-20 DIAGNOSIS — I110 Hypertensive heart disease with heart failure: Secondary | ICD-10-CM | POA: Diagnosis not present

## 2018-12-20 DIAGNOSIS — M810 Age-related osteoporosis without current pathological fracture: Secondary | ICD-10-CM | POA: Diagnosis not present

## 2018-12-20 DIAGNOSIS — I6521 Occlusion and stenosis of right carotid artery: Secondary | ICD-10-CM | POA: Diagnosis not present
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
## 2018-12-23 ENCOUNTER — Other Ambulatory Visit: Payer: Self-pay | Admitting: *Deleted

## 2018-12-23 ENCOUNTER — Ambulatory Visit: Payer: Self-pay | Admitting: *Deleted

## 2018-12-23 DIAGNOSIS — I6521 Occlusion and stenosis of right carotid artery: Secondary | ICD-10-CM | POA: Diagnosis not present

## 2018-12-23 DIAGNOSIS — I69354 Hemiplegia and hemiparesis following cerebral infarction affecting left non-dominant side: Secondary | ICD-10-CM | POA: Diagnosis not present

## 2018-12-23 DIAGNOSIS — E1142 Type 2 diabetes mellitus with diabetic polyneuropathy: Secondary | ICD-10-CM | POA: Diagnosis not present

## 2018-12-23 DIAGNOSIS — I110 Hypertensive heart disease with heart failure: Secondary | ICD-10-CM | POA: Diagnosis not present

## 2018-12-23 DIAGNOSIS — M810 Age-related osteoporosis without current pathological fracture: Secondary | ICD-10-CM | POA: Diagnosis not present

## 2018-12-23 DIAGNOSIS — I2510 Atherosclerotic heart disease of native coronary artery without angina pectoris: Secondary | ICD-10-CM | POA: Diagnosis not present
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
## 2018-12-24 DIAGNOSIS — Z7902 Long term (current) use of antithrombotics/antiplatelets: Secondary | ICD-10-CM | POA: Diagnosis not present

## 2018-12-24 DIAGNOSIS — I110 Hypertensive heart disease with heart failure: Secondary | ICD-10-CM | POA: Diagnosis not present

## 2018-12-24 DIAGNOSIS — I739 Peripheral vascular disease, unspecified: Secondary | ICD-10-CM | POA: Diagnosis not present

## 2018-12-24 DIAGNOSIS — Z794 Long term (current) use of insulin: Secondary | ICD-10-CM | POA: Diagnosis not present

## 2018-12-24 DIAGNOSIS — Z7982 Long term (current) use of aspirin: Secondary | ICD-10-CM | POA: Diagnosis not present

## 2018-12-24 DIAGNOSIS — D649 Anemia, unspecified: Secondary | ICD-10-CM | POA: Diagnosis not present

## 2018-12-24 DIAGNOSIS — E1142 Type 2 diabetes mellitus with diabetic polyneuropathy: Secondary | ICD-10-CM | POA: Diagnosis not present

## 2018-12-24 DIAGNOSIS — R262 Difficulty in walking, not elsewhere classified: Secondary | ICD-10-CM | POA: Diagnosis not present

## 2018-12-24 DIAGNOSIS — G2581 Restless legs syndrome: Secondary | ICD-10-CM | POA: Diagnosis not present

## 2018-12-24 DIAGNOSIS — E785 Hyperlipidemia, unspecified: Secondary | ICD-10-CM | POA: Diagnosis not present

## 2018-12-24 DIAGNOSIS — Z87440 Personal history of urinary (tract) infections: Secondary | ICD-10-CM | POA: Diagnosis not present

## 2018-12-24 DIAGNOSIS — I10 Essential (primary) hypertension: Secondary | ICD-10-CM | POA: Diagnosis not present

## 2018-12-24 DIAGNOSIS — I69354 Hemiplegia and hemiparesis following cerebral infarction affecting left non-dominant side: Secondary | ICD-10-CM | POA: Diagnosis not present

## 2018-12-24 DIAGNOSIS — M810 Age-related osteoporosis without current pathological fracture: Secondary | ICD-10-CM | POA: Diagnosis not present

## 2018-12-24 DIAGNOSIS — Z79899 Other long term (current) drug therapy: Secondary | ICD-10-CM | POA: Diagnosis not present

## 2018-12-24 DIAGNOSIS — I5032 Chronic diastolic (congestive) heart failure: Secondary | ICD-10-CM | POA: Diagnosis not present

## 2018-12-24 DIAGNOSIS — Z9181 History of falling: Secondary | ICD-10-CM | POA: Diagnosis not present

## 2018-12-24 DIAGNOSIS — Z8673 Personal history of transient ischemic attack (TIA), and cerebral infarction without residual deficits: Secondary | ICD-10-CM | POA: Diagnosis not present

## 2018-12-24 DIAGNOSIS — J449 Chronic obstructive pulmonary disease, unspecified: Secondary | ICD-10-CM | POA: Diagnosis not present

## 2018-12-24 DIAGNOSIS — I6521 Occlusion and stenosis of right carotid artery: Secondary | ICD-10-CM | POA: Diagnosis not present

## 2018-12-24 DIAGNOSIS — Z741 Need for assistance with personal care: Secondary | ICD-10-CM | POA: Diagnosis not present

## 2018-12-24 DIAGNOSIS — M25562 Pain in left knee: Secondary | ICD-10-CM | POA: Diagnosis not present

## 2018-12-24 DIAGNOSIS — Z7984 Long term (current) use of oral hypoglycemic drugs: Secondary | ICD-10-CM | POA: Diagnosis not present

## 2018-12-24 DIAGNOSIS — I2510 Atherosclerotic heart disease of native coronary artery without angina pectoris: Secondary | ICD-10-CM | POA: Diagnosis not present

## 2018-12-25 ENCOUNTER — Encounter: Payer: Self-pay | Admitting: *Deleted

## 2018-12-25 ENCOUNTER — Other Ambulatory Visit: Payer: Self-pay | Admitting: *Deleted

## 2018-12-25 DIAGNOSIS — I5032 Chronic diastolic (congestive) heart failure: Secondary | ICD-10-CM | POA: Diagnosis not present

## 2018-12-25 DIAGNOSIS — E1142 Type 2 diabetes mellitus with diabetic polyneuropathy: Secondary | ICD-10-CM | POA: Diagnosis not present

## 2018-12-25 DIAGNOSIS — I69354 Hemiplegia and hemiparesis following cerebral infarction affecting left non-dominant side: Secondary | ICD-10-CM | POA: Diagnosis not present

## 2018-12-25 DIAGNOSIS — I6521 Occlusion and stenosis of right carotid artery: Secondary | ICD-10-CM | POA: Diagnosis not present

## 2018-12-25 DIAGNOSIS — I2510 Atherosclerotic heart disease of native coronary artery without angina pectoris: Secondary | ICD-10-CM | POA: Diagnosis not present

## 2018-12-25 DIAGNOSIS — I110 Hypertensive heart disease with heart failure: Secondary | ICD-10-CM | POA: Diagnosis not present
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
## 2018-12-27 DIAGNOSIS — I6521 Occlusion and stenosis of right carotid artery: Secondary | ICD-10-CM | POA: Diagnosis not present

## 2018-12-27 DIAGNOSIS — I5032 Chronic diastolic (congestive) heart failure: Secondary | ICD-10-CM | POA: Diagnosis not present

## 2018-12-27 DIAGNOSIS — E1142 Type 2 diabetes mellitus with diabetic polyneuropathy: Secondary | ICD-10-CM | POA: Diagnosis not present

## 2018-12-27 DIAGNOSIS — I69354 Hemiplegia and hemiparesis following cerebral infarction affecting left non-dominant side: Secondary | ICD-10-CM | POA: Diagnosis not present

## 2018-12-27 DIAGNOSIS — I110 Hypertensive heart disease with heart failure: Secondary | ICD-10-CM | POA: Diagnosis not present

## 2018-12-27 DIAGNOSIS — I2510 Atherosclerotic heart disease of native coronary artery without angina pectoris: Secondary | ICD-10-CM | POA: Diagnosis not present

## 2018-12-30 ENCOUNTER — Other Ambulatory Visit: Payer: Self-pay | Admitting: *Deleted

## 2018-12-30 DIAGNOSIS — I2510 Atherosclerotic heart disease of native coronary artery without angina pectoris: Secondary | ICD-10-CM | POA: Diagnosis not present

## 2018-12-30 DIAGNOSIS — E1142 Type 2 diabetes mellitus with diabetic polyneuropathy: Secondary | ICD-10-CM | POA: Diagnosis not present

## 2018-12-30 DIAGNOSIS — I110 Hypertensive heart disease with heart failure: Secondary | ICD-10-CM | POA: Diagnosis not present

## 2018-12-30 DIAGNOSIS — I6521 Occlusion and stenosis of right carotid artery: Secondary | ICD-10-CM | POA: Diagnosis not present

## 2018-12-30 DIAGNOSIS — I5032 Chronic diastolic (congestive) heart failure: Secondary | ICD-10-CM | POA: Diagnosis not present

## 2018-12-30 DIAGNOSIS — I69354 Hemiplegia and hemiparesis following cerebral infarction affecting left non-dominant side: Secondary | ICD-10-CM | POA: Diagnosis not present
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
## 2019-01-01 ENCOUNTER — Other Ambulatory Visit: Payer: Self-pay | Admitting: *Deleted

## 2019-01-01 DIAGNOSIS — I69354 Hemiplegia and hemiparesis following cerebral infarction affecting left non-dominant side: Secondary | ICD-10-CM | POA: Diagnosis not present

## 2019-01-01 DIAGNOSIS — I2510 Atherosclerotic heart disease of native coronary artery without angina pectoris: Secondary | ICD-10-CM | POA: Diagnosis not present

## 2019-01-01 DIAGNOSIS — I5032 Chronic diastolic (congestive) heart failure: Secondary | ICD-10-CM | POA: Diagnosis not present

## 2019-01-01 DIAGNOSIS — E1142 Type 2 diabetes mellitus with diabetic polyneuropathy: Secondary | ICD-10-CM | POA: Diagnosis not present

## 2019-01-01 DIAGNOSIS — I110 Hypertensive heart disease with heart failure: Secondary | ICD-10-CM | POA: Diagnosis not present

## 2019-01-01 DIAGNOSIS — I6521 Occlusion and stenosis of right carotid artery: Secondary | ICD-10-CM | POA: Diagnosis not present
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
## 2019-01-01 NOTE — Patient Outreach (Signed)
[facility]) Care Management  01/01/2019  [name] 1945-05-15 [id number]   CSW made an attempt to try and contact patient today to follow-up regarding social work services and resources; however, patient was unavailable at the time of CSW's call.  CSW left a HIPAA compliant message on voicemail for patient and is currently awaiting a return call.  CSW will make a second outreach attempt within the next 3-4 business days, if a return call is not received from patient in the meantime.  Nat Christen, BSW, MSW, LCSW  Licensed Education officer, [facility]  Mailing [place] [address] [place], [place] [address] Physical Address-[address], [place], [place] [address] Toll Free Main # [phone] Fax # [phone] Cell # [phone]  Office # [phone] [email or web address]

## 2019-01-02 DIAGNOSIS — I69354 Hemiplegia and hemiparesis following cerebral infarction affecting left non-dominant side: Secondary | ICD-10-CM | POA: Diagnosis not present

## 2019-01-02 DIAGNOSIS — I6521 Occlusion and stenosis of right carotid artery: Secondary | ICD-10-CM | POA: Diagnosis not present

## 2019-01-02 DIAGNOSIS — E1142 Type 2 diabetes mellitus with diabetic polyneuropathy: Secondary | ICD-10-CM | POA: Diagnosis not present

## 2019-01-02 DIAGNOSIS — I2510 Atherosclerotic heart disease of native coronary artery without angina pectoris: Secondary | ICD-10-CM | POA: Diagnosis not present

## 2019-01-02 DIAGNOSIS — I5032 Chronic diastolic (congestive) heart failure: Secondary | ICD-10-CM | POA: Diagnosis not present

## 2019-01-02 DIAGNOSIS — I110 Hypertensive heart disease with heart failure: Secondary | ICD-10-CM | POA: Diagnosis not present

## 2019-01-03 DIAGNOSIS — I6521 Occlusion and stenosis of right carotid artery: Secondary | ICD-10-CM | POA: Diagnosis not present

## 2019-01-03 DIAGNOSIS — I2510 Atherosclerotic heart disease of native coronary artery without angina pectoris: Secondary | ICD-10-CM | POA: Diagnosis not present

## 2019-01-03 DIAGNOSIS — I5032 Chronic diastolic (congestive) heart failure: Secondary | ICD-10-CM | POA: Diagnosis not present

## 2019-01-03 DIAGNOSIS — I110 Hypertensive heart disease with heart failure: Secondary | ICD-10-CM | POA: Diagnosis not present

## 2019-01-03 DIAGNOSIS — E1142 Type 2 diabetes mellitus with diabetic polyneuropathy: Secondary | ICD-10-CM | POA: Diagnosis not present

## 2019-01-03 DIAGNOSIS — I69354 Hemiplegia and hemiparesis following cerebral infarction affecting left non-dominant side: Secondary | ICD-10-CM | POA: Diagnosis not present

## 2019-01-06 ENCOUNTER — Other Ambulatory Visit: Payer: Self-pay | Admitting: *Deleted

## 2019-01-06 ENCOUNTER — Ambulatory Visit: Payer: Self-pay | Admitting: *Deleted
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
## 2019-01-07 DIAGNOSIS — I69354 Hemiplegia and hemiparesis following cerebral infarction affecting left non-dominant side: Secondary | ICD-10-CM | POA: Diagnosis not present

## 2019-01-07 DIAGNOSIS — I110 Hypertensive heart disease with heart failure: Secondary | ICD-10-CM | POA: Diagnosis not present

## 2019-01-07 DIAGNOSIS — E1142 Type 2 diabetes mellitus with diabetic polyneuropathy: Secondary | ICD-10-CM | POA: Diagnosis not present

## 2019-01-07 DIAGNOSIS — I5032 Chronic diastolic (congestive) heart failure: Secondary | ICD-10-CM | POA: Diagnosis not present

## 2019-01-07 DIAGNOSIS — I2510 Atherosclerotic heart disease of native coronary artery without angina pectoris: Secondary | ICD-10-CM | POA: Diagnosis not present

## 2019-01-07 DIAGNOSIS — I6521 Occlusion and stenosis of right carotid artery: Secondary | ICD-10-CM | POA: Diagnosis not present

## 2019-01-08 ENCOUNTER — Other Ambulatory Visit: Payer: Self-pay | Admitting: *Deleted

## 2019-01-08 NOTE — Patient Outreach (Signed)
[facility]) Care Management  01/08/2019  [name] December 23, 1945 [id number]   CSW made several attempts to try and contact patient today, on both her home phone number, as well as her cell phone number, without success.  When calling patient's home phone number, CSW received the following automated recording, "Welcome to [employer], Your Call Can Not Be Completed As Dialed, Please Check the Number and Try Your Call Again Later".  CSW was unable to leave a HIPAA compliant message.  When calling patient's cell phone number, CSW received the following automated recording, "The Person You Are Trying To Reach Does Not Have Voicemail Set Up Yet, Please Try Your Call Again Later".  Again, CSW was unable to leave a HIPAA compliant message.  CSW will make a second attempt to try and outreach to patient again in the next 3-4 business days, if a return call is not received from patient in the meantime.  Nat Christen, BSW, MSW, LCSW  Licensed Education officer, [facility]  Mailing [place] [address], [place], [place] [address] Physical Address-[address], [place], [place] [address] Toll Free Main # [phone] Fax # [phone] Cell # [phone]  Office # [phone] [email or web address]

## 2019-01-09 DIAGNOSIS — I5032 Chronic diastolic (congestive) heart failure: Secondary | ICD-10-CM | POA: Diagnosis not present

## 2019-01-09 DIAGNOSIS — I6521 Occlusion and stenosis of right carotid artery: Secondary | ICD-10-CM | POA: Diagnosis not present

## 2019-01-09 DIAGNOSIS — I110 Hypertensive heart disease with heart failure: Secondary | ICD-10-CM | POA: Diagnosis not present

## 2019-01-09 DIAGNOSIS — I2510 Atherosclerotic heart disease of native coronary artery without angina pectoris: Secondary | ICD-10-CM | POA: Diagnosis not present

## 2019-01-09 DIAGNOSIS — I69354 Hemiplegia and hemiparesis following cerebral infarction affecting left non-dominant side: Secondary | ICD-10-CM | POA: Diagnosis not present

## 2019-01-09 DIAGNOSIS — E1142 Type 2 diabetes mellitus with diabetic polyneuropathy: Secondary | ICD-10-CM | POA: Diagnosis not present

## 2019-01-11 ENCOUNTER — Other Ambulatory Visit: Payer: Self-pay

## 2019-01-11 ENCOUNTER — Emergency Department (HOSPITAL_COMMUNITY): Payer: Medicare Other

## 2019-01-11 ENCOUNTER — Encounter (HOSPITAL_COMMUNITY): Payer: Self-pay

## 2019-01-11 ENCOUNTER — Emergency Department (HOSPITAL_COMMUNITY)
Admission: EM | Admit: 2019-01-11 | Discharge: 2019-01-13 | Disposition: A | Discharge status: Other Acute Inpatient Hospital | Payer: Medicare Other | Attending: Emergency Medicine | Admitting: Emergency Medicine

## 2019-01-11 DIAGNOSIS — E1142 Type 2 diabetes mellitus with diabetic polyneuropathy: Secondary | ICD-10-CM | POA: Diagnosis not present

## 2019-01-11 DIAGNOSIS — G459 Transient cerebral ischemic attack, unspecified: Secondary | ICD-10-CM | POA: Diagnosis not present

## 2019-01-11 DIAGNOSIS — Z79899 Other long term (current) drug therapy: Secondary | ICD-10-CM | POA: Diagnosis not present

## 2019-01-11 DIAGNOSIS — Z7902 Long term (current) use of antithrombotics/antiplatelets: Secondary | ICD-10-CM | POA: Diagnosis not present

## 2019-01-11 DIAGNOSIS — I10 Essential (primary) hypertension: Secondary | ICD-10-CM | POA: Diagnosis not present

## 2019-01-11 DIAGNOSIS — R52 Pain, unspecified: Secondary | ICD-10-CM | POA: Diagnosis not present

## 2019-01-11 DIAGNOSIS — R609 Edema, unspecified: Secondary | ICD-10-CM | POA: Diagnosis not present

## 2019-01-11 DIAGNOSIS — Z794 Long term (current) use of insulin: Secondary | ICD-10-CM | POA: Insufficient documentation

## 2019-01-11 DIAGNOSIS — M1611 Unilateral primary osteoarthritis, right hip: Secondary | ICD-10-CM | POA: Insufficient documentation

## 2019-01-11 DIAGNOSIS — M25551 Pain in right hip: Secondary | ICD-10-CM | POA: Diagnosis not present

## 2019-01-11 DIAGNOSIS — J449 Chronic obstructive pulmonary disease, unspecified: Secondary | ICD-10-CM | POA: Insufficient documentation

## 2019-01-11 DIAGNOSIS — I5032 Chronic diastolic (congestive) heart failure: Secondary | ICD-10-CM | POA: Insufficient documentation

## 2019-01-11 DIAGNOSIS — Z20828 Contact with and (suspected) exposure to other viral communicable diseases: Secondary | ICD-10-CM | POA: Diagnosis not present

## 2019-01-11 DIAGNOSIS — Z03818 Encounter for observation for suspected exposure to other biological agents ruled out: Secondary | ICD-10-CM | POA: Diagnosis not present

## 2019-01-11 DIAGNOSIS — I110 Hypertensive heart disease with heart failure: Secondary | ICD-10-CM | POA: Insufficient documentation

## 2019-01-11 LAB — CBG MONITORING, ED
Glucose-Capillary: 104 mg/dL — ABNORMAL HIGH (ref 70–99)
Glucose-Capillary: 222 mg/dL — ABNORMAL HIGH (ref 70–99)

## 2019-01-11 MED ORDER — OXYCODONE HCL 5 MG PO TABS
5.0000 mg | ORAL_TABLET | Freq: Once | ORAL | Status: AC
  Administered 2019-01-11: 5 mg via ORAL
  Filled 2019-01-11: qty 1

## 2019-01-11 MED ORDER — ACETAMINOPHEN 500 MG PO TABS
1000.0000 mg | ORAL_TABLET | Freq: Once | ORAL | Status: AC
  Administered 2019-01-11: 1000 mg via ORAL
  Filled 2019-01-11: qty 2

## 2019-01-11 MED ORDER — TRAMADOL HCL 50 MG PO TABS: 50.0000 mg | ORAL_TABLET | Freq: Four times a day (QID) | ORAL | 0 refills | Status: DC | PRN

## 2019-01-11 MED ORDER — INSULIN GLARGINE 100 UNIT/ML ~~LOC~~ SOLN
10.0000 [IU] | Freq: Two times a day (BID) | SUBCUTANEOUS | Status: DC
  Administered 2019-01-12 – 2019-01-13 (×4): 10 [IU] via SUBCUTANEOUS
  Filled 2019-01-11 (×6): qty 0.1

## 2019-01-11 MED ORDER — ROPINIROLE HCL 1 MG PO TABS
1.0000 mg | ORAL_TABLET | Freq: Every day | ORAL | Status: DC
  Administered 2019-01-11 – 2019-01-12 (×2): 1 mg via ORAL
  Filled 2019-01-11 (×4): qty 1

## 2019-01-11 MED ORDER — LORAZEPAM 2 MG/ML IJ SOLN: 1.0000 mg | Freq: Once | INTRAMUSCULAR | Status: DC

## 2019-01-11 MED ORDER — CLOPIDOGREL BISULFATE 75 MG PO TABS
75.0000 mg | ORAL_TABLET | Freq: Every morning | ORAL | Status: DC
  Administered 2019-01-12 – 2019-01-13 (×2): 75 mg via ORAL
  Filled 2019-01-11 (×2): qty 1

## 2019-01-11 MED ORDER — TORSEMIDE 20 MG PO TABS
100.0000 mg | ORAL_TABLET | Freq: Two times a day (BID) | ORAL | Status: DC
  Administered 2019-01-12 – 2019-01-13 (×3): 100 mg via ORAL
  Filled 2019-01-11 (×4): qty 5

## 2019-01-11 MED ORDER — OXYCODONE HCL 5 MG PO TABS
5.0000 mg | ORAL_TABLET | Freq: Once | ORAL | Status: DC
  Filled 2019-01-11: qty 1

## 2019-01-11 MED ORDER — LIDOCAINE 5 % EX PTCH
1.0000 | MEDICATED_PATCH | CUTANEOUS | Status: DC
  Administered 2019-01-11: 19:00:00 1 via TRANSDERMAL
  Filled 2019-01-11 (×2): qty 1

## 2019-01-11 MED ORDER — EZETIMIBE 10 MG PO TABS
10.0000 mg | ORAL_TABLET | Freq: Every morning | ORAL | Status: DC
  Administered 2019-01-12 – 2019-01-13 (×2): 10 mg via ORAL
  Filled 2019-01-11 (×3): qty 1

## 2019-01-11 MED ORDER — LIDOCAINE 5 % EX PTCH: 1.0000 | MEDICATED_PATCH | CUTANEOUS | 0 refills | Status: DC

## 2019-01-11 MED ORDER — TRAMADOL HCL 50 MG PO TABS
50.0000 mg | ORAL_TABLET | Freq: Two times a day (BID) | ORAL | Status: DC | PRN
  Administered 2019-01-12: 50 mg via ORAL
  Filled 2019-01-11: qty 1

## 2019-01-11 MED ORDER — HYDRALAZINE HCL 25 MG PO TABS
25.0000 mg | ORAL_TABLET | Freq: Three times a day (TID) | ORAL | Status: DC
  Administered 2019-01-11 – 2019-01-12 (×4): 25 mg via ORAL
  Filled 2019-01-11 (×5): qty 1

## 2019-01-11 MED ORDER — LORAZEPAM 1 MG PO TABS
0.5000 mg | ORAL_TABLET | Freq: Once | ORAL | Status: AC
  Administered 2019-01-11: 23:00:00 0.5 mg via ORAL
  Filled 2019-01-11: qty 1

## 2019-01-11 MED ORDER — DIPHENHYDRAMINE HCL 25 MG PO CAPS
25.0000 mg | ORAL_CAPSULE | Freq: Once | ORAL | Status: AC
  Administered 2019-01-11: 25 mg via ORAL
  Filled 2019-01-11: qty 1

## 2019-01-11 MED ORDER — ATORVASTATIN CALCIUM 80 MG PO TABS
80.0000 mg | ORAL_TABLET | Freq: Every day | ORAL | Status: DC
  Administered 2019-01-11 – 2019-01-12 (×2): 80 mg via ORAL
  Filled 2019-01-11 (×2): qty 1

## 2019-01-11 MED ORDER — GABAPENTIN 300 MG PO CAPS
300.0000 mg | ORAL_CAPSULE | Freq: Three times a day (TID) | ORAL | Status: DC
  Administered 2019-01-11 – 2019-01-13 (×5): 300 mg via ORAL
  Filled 2019-01-11 (×5): qty 1

## 2019-01-11 MED ORDER — IBUPROFEN 400 MG PO TABS
400.0000 mg | ORAL_TABLET | Freq: Four times a day (QID) | ORAL | Status: DC | PRN
  Administered 2019-01-12: 400 mg via ORAL
  Filled 2019-01-11: qty 1

## 2019-01-11 MED ORDER — LISINOPRIL 20 MG PO TABS
40.0000 mg | ORAL_TABLET | Freq: Every day | ORAL | Status: DC
  Administered 2019-01-11 – 2019-01-12 (×2): 40 mg via ORAL
  Filled 2019-01-11 (×2): qty 2

## 2019-01-11 MED ORDER — LINAGLIPTIN 5 MG PO TABS
5.0000 mg | ORAL_TABLET | Freq: Every day | ORAL | Status: DC
  Administered 2019-01-12 – 2019-01-13 (×2): 5 mg via ORAL
  Filled 2019-01-11 (×2): qty 1

## 2019-01-11 MED ORDER — INSULIN ASPART 100 UNIT/ML ~~LOC~~ SOLN
3.0000 [IU] | Freq: Three times a day (TID) | SUBCUTANEOUS | Status: DC
  Administered 2019-01-12 – 2019-01-13 (×5): 3 [IU] via SUBCUTANEOUS

## 2019-01-11 NOTE — ED Provider Notes (Signed)
If patient's work-up is unremarkable and she is okay to go home she would benefit from a wheelchair.  She is unable to ambulate safely with her current home devices.   Lennice Sites, DO 01/11/19 2244

## 2019-01-11 NOTE — ED Notes (Signed)
  Attempted to ambulate patient with walker but patient could not bear weight on R leg.  Patient stated it caused too much pain and was tearful.  Dr. Ronnald Nian notified.

## 2019-01-11 NOTE — ED Provider Notes (Signed)
Patient here with right hip pain.  She has history of osteoarthritis in his hip.  She had a recent stroke that has left her with left-sided weakness.  She has been able to ambulate with a walker since her stroke however over the last several days she has increasing right hip pain but denies any trauma.  X-ray was overall unremarkable.  Does show some severe arthritis.  She likely needs a hip replacement at this time.  However, patient was unable to ambulate safely while in the emergency department.  She does not have anybody at home that can help her get around.  She states that her house is not very well set up for wheelchair use.  Overall given her inability to walk and pain we will get an MRI to further evaluate for an occult right hip fracture.  Her home medications have been ordered.  Given the time a day she would likely need to spend the night in the emergency department to have case management, social work and physical therapy to evaluate her.  She thinks that she needs to be in a skilled nursing facility and this is likely true.  She has not taken much for pain medicine and will continue some pain management here with tramadol.  Patient will be signed out to oncoming ED staff.  MRI is pending.  If there is a fracture she will be admitted for likely surgery.  If MRI is unremarkable then patient will be evaluated in the morning by case management, social work, physical therapy and long-term plan will be made.  At this time she is not safe to go home.  This chart was dictated using voice recognition software.  Despite best efforts to proofread,  errors can occur which can change the documentation meaning.     Lennice Sites, DO 01/11/19 2243

## 2019-01-11 NOTE — ED Provider Notes (Addendum)
[facility] EMERGENCY DEPARTMENT Provider Note   CSN: [id number] Arrival date & time: 01/11/19  1741     History   Chief Complaint Chief Complaint  Patient presents with  . Hip Pain    right hip    HPI [name] is a 73 y.o. female.     HPI   73 year old female with a history of CHF, diabetes, hypertension, hyperlipidemia, CVA, who presents with concern for right hip pain.  Patient reports she has had approximately 6 years of hip pain since she had fallen on it, however have been worsening over the course of the last year.  She was supposed to have hip surgery this summer, however she suffered a CVA, and was unable to have surgery.  Over the last 3 days, her symptoms have been worsening.  Reports she feels that her hip has been popping for the last 3 days and that she has had severe pain.  Reports she has been unable to walk secondary to her pain.  She uses a walker at home but even using the walker and now is difficult to get around.  Reports she does not like to take pain medications, and has only taken ibuprofen for her pain.  Reports she did not take her Lasix for the last 2 days because she did not want to have to get up to go to the bathroom.  She lives at home with her husband.  Her desire is to have her hip replaced more emergently, reporting that she was told it was bone-on-bone bone.  She has an appointment with Dr. Loralie Champagne in [place] with orthopedics on September 18, but did not feel she could wait that long given her recent worsening of symptoms.  Denies fevers, cough, congestion, recent falls, chest pain, shortness of breath or other symptoms.  Her leg swelling has slightly worsened since she has not taken her Lasix for the last 2 days.  Past Medical History:  Diagnosis Date  . CHF (congestive heart failure) (HCC)   . Diabetes mellitus without complication (HCC)   . Hyperlipidemia   . Hypertension   . Stroke [facility])     Patient Active Problem List    Diagnosis Date Noted  . Acute pain of left knee   . Fall 11/21/2018  . Carotid stenosis, right 11/18/2018  . Diabetes mellitus type II, uncontrolled (HCC) 11/18/2018  . Family hx-stroke 11/18/2018  . Cerebral infarction due to stenosis of right carotid artery (HCC) s/p tPA 11/15/2018  . Hyperlipidemia 01/14/2018  . COPD (chronic obstructive pulmonary disease) (HCC) 01/14/2018  . Angina pectoris (HCC) 07/28/2016  . Chronic diastolic congestive heart failure (HCC) 07/28/2016  . Hypertensive heart disease with heart failure (HCC) 07/28/2016  . Diabetic polyneuropathy associated with type 2 diabetes mellitus (HCC) 11/30/2015    Past Surgical History:  Procedure Laterality Date  . ABDOMINAL HYSTERECTOMY    . APPENDECTOMY    . CARDIAC CATHETERIZATION    . ENDARTERECTOMY Right 11/22/2018   Procedure: ENDARTERECTOMY CAROTID RIGHT;  Surgeon: Cephus Shelling, MD;  Location: [facility] OR;  Service: Vascular;  Laterality: Right;  . KNEE ARTHROCENTESIS  11/22/2018      . PATCH ANGIOPLASTY Right 11/22/2018   Procedure: Patch Angioplasty Right Carotid Artery using Xenosure Biologic Patch;  Surgeon: Cephus Shelling, MD;  Location: [facility] OR;  Service: Vascular;  Laterality: Right;     OB History   No obstetric history on file.      Home Medications  Prior to Admission medications   Medication Sig Start Date End Date Taking? Authorizing Provider  atorvastatin (LIPITOR) 80 MG tablet Take 80 mg by mouth at bedtime.    Yes [provider]  Calcium Carb-Cholecalciferol (CALCIUM 500+D PO) Take 1 tablet by mouth every morning.   Yes [provider]  Cholecalciferol (VITAMIN D3 PO) Take 1 tablet by mouth every morning.   Yes [provider]  clopidogrel (PLAVIX) 75 MG tablet Take 75 mg by mouth every morning.    Yes [provider]  ezetimibe (ZETIA) 10 MG tablet Take 10 mg by mouth every morning.    Yes [provider]  gabapentin (NEURONTIN) 300 MG  capsule Take 1 capsule (300 mg total) by mouth at bedtime. Patient taking differently: Take 300 mg by mouth 3 (three) times daily.  11/26/18  Yes Al Decant, MD  hydrALAZINE (APRESOLINE) 25 MG tablet Take 25 mg by mouth 3 (three) times daily.   Yes [provider]  ibuprofen (ADVIL) 200 MG tablet Take 400 mg by mouth every 6 (six) hours as needed for headache (pain).   Yes [provider]  insulin glargine (LANTUS) 100 UNIT/ML injection Inject 0.2 mLs (20 Units total) into the skin daily. Patient taking differently: Inject 20 Units into the skin 2 (two) times daily.  11/26/18  Yes Al Decant, MD  insulin lispro (HUMALOG) 100 UNIT/ML injection Inject 0.03 mLs (3 Units total) into the skin 3 (three) times daily with meals. 11/26/18  Yes Al Decant, MD  lisinopril (ZESTRIL) 40 MG tablet Take 0.5 tablets (20 mg total) by mouth daily. Patient taking differently: Take 40 mg by mouth at bedtime.  11/18/18  Yes Donzetta Starch, NP  nitroGLYCERIN (NITROSTAT) 0.4 MG SL tablet Place 0.4 mg under the tongue every 5 (five) minutes as needed for chest pain.    Yes [provider]  olmesartan (BENICAR) 40 MG tablet Take 40 mg by mouth every morning.   Yes [provider]  rOPINIRole (REQUIP) 1 MG tablet Take 1 mg by mouth at bedtime.    Yes [provider]  sitaGLIPtin (JANUVIA) 100 MG tablet Take 100 mg by mouth every morning.    Yes [provider]  torsemide (DEMADEX) 100 MG tablet Take 100 mg by mouth 2 (two) times daily.    Yes [provider]  clotrimazole (GYNE-LOTRIMIN) 1 % vaginal cream Place 1 Applicatorful vaginally at bedtime. Patient not taking: Reported on 12/18/2018 11/18/18   Donzetta Starch, NP  glimepiride (AMARYL) 4 MG tablet Take 1 tablet (4 mg total) by mouth 2 (two) times daily with a meal. Patient not taking: Reported on 01/11/2019 11/18/18   Donzetta Starch, NP  lidocaine (LIDODERM) 5 % Place 1 patch onto the skin daily. Remove  & Discard patch within 12 hours or as directed by MD 01/11/19   Gareth Morgan, MD  traMADol (ULTRAM) 50 MG tablet Take 1 tablet (50 mg total) by mouth every 6 (six) hours as needed. 01/11/19   Gareth Morgan, MD    Family History Family History  Problem Relation Age of Onset  . Stroke Mother   . Prostate cancer Father   . CAD Sister   . CAD Sister   . Valvular heart disease Sister     Social History Social History   Tobacco Use  . Smoking status: Never Smoker  . Smokeless tobacco: Never Used  Substance Use Topics  . Alcohol use: Not Currently  . Drug use: Never  Allergies   Patient has no known allergies.   Review of Systems Review of Systems  Constitutional: Negative for fever.  HENT: Negative for sore throat.   Eyes: Negative for visual disturbance.  Respiratory: Negative for cough and shortness of breath.   Cardiovascular: Positive for leg swelling. Negative for chest pain.  Gastrointestinal: Negative for abdominal pain, nausea and vomiting.  Genitourinary: Negative for difficulty urinating.  Musculoskeletal: Positive for arthralgias and gait problem. Negative for back pain.  Skin: Negative for rash.  Neurological: Negative for syncope and headaches.     Physical Exam Updated Vital Signs BP (!) 159/83 (BP Location: Right Arm)   Pulse 90   Temp 97.7 F (36.5 C) (Oral)   Resp 16   Ht 5\' 3"  (1.6 m)   Wt 91.6 kg   SpO2 99%   BMI 35.78 kg/m   Physical Exam Vitals signs and nursing note reviewed.  Constitutional:      General: She is not in acute distress.    Appearance: She is well-developed. She is not diaphoretic.  HENT:     Head: Normocephalic and atraumatic.  Eyes:     Conjunctiva/sclera: Conjunctivae normal.  Neck:     Musculoskeletal: Normal range of motion.  Cardiovascular:     Rate and Rhythm: Normal rate and regular rhythm.     Heart sounds: Normal heart sounds.  Pulmonary:     Effort: Pulmonary effort is normal. No respiratory  distress.     Breath sounds: No wheezing.  Abdominal:     General: There is no distension.     Palpations: Abdomen is soft.     Tenderness: There is no abdominal tenderness. There is no guarding.  Musculoskeletal:     Right hip: She exhibits decreased range of motion (passive and active) and tenderness.     Comments: 1+ edema biteral LE  Skin:    General: Skin is warm and dry.     Findings: No erythema or rash.  Neurological:     Mental Status: She is alert and oriented to person, place, and time.      ED Treatments / Results  Labs (all labs ordered are listed, but only abnormal results are displayed) Labs Reviewed - No data to display  EKG None  Radiology Dg Hip Unilat W Or Wo Pelvis 2-3 Views Right  Result Date: 01/11/2019 CLINICAL DATA:  Pain, right hip EXAM: DG HIP (WITH OR WITHOUT PELVIS) 2-3V RIGHT COMPARISON:  November 20, 2018 FINDINGS: Advanced bilateral hip osteoarthritis is seen with superior joint space loss and subchondral sclerosis. At the superior right femoral head there appears to sclerosis and contour irregularity as on the prior exam. There is diffuse osteopenia. No definite fracture is seen. Degenerative changes in the lower lumbar spine. IMPRESSION: Advanced bilateral hip osteoarthritis, right greater than left. If there is high clinical suspicion for occult hip fracture or the patient refuses to weightbear, consider further evaluation with MRI. Although CT is expeditious, evidence is lacking regarding accuracy of CT over plain film radiography. Electronically Signed   By: Jonna ClarkBindu  Avutu M.D.   On: 01/11/2019 20:20    Procedures Procedures (including critical care time)  Medications Ordered in ED Medications  oxyCODONE (Oxy IR/ROXICODONE) immediate release tablet 5 mg (5 mg Oral Refused 01/11/19 1902)  lidocaine (LIDODERM) 5 % 1 patch (1 patch Transdermal Patch Applied 01/11/19 1902)  acetaminophen (TYLENOL) tablet 1,000 mg (1,000 mg Oral Given 01/11/19 1901)   diphenhydrAMINE (BENADRYL) capsule 25 mg (25 mg Oral Given 01/11/19  2040)  oxyCODONE (Oxy IR/ROXICODONE) immediate release tablet 5 mg (5 mg Oral Given 01/11/19 2040)     Initial Impression / Assessment and Plan / ED Course  I have reviewed the triage vital signs and the nursing notes.  Pertinent labs & imaging results that were available during my care of the patient were reviewed by me and considered in my medical decision making (see chart for details).  Clinical Course as of Jan 14 2006  Wynelle LinkSun Jan 12, 2019  1546 Platelets: 246 [BM]    Clinical Course User Index [BM] Elizabeth PalauMorelli, Brandon A, PA-C       73 year old female with a history of CHF, diabetes, hypertension, hyperlipidemia, CVA, who presents with concern for right hip pain which has been present for years, with hip surgery delayed this summer due to CVA, and worsening of pain within the last 3 days.  X-ray of the right hip obtained given worsening of pain, and no prior imaging in our system.  She is afebrile, have low suspicion for septic arthritis.  X-ray of the hip shows osteoarthritis.  Discussed with patient that on-call orthopedics is available for emergent surgeries, and while she is having severe limiting pain related to her arthritis, she does not meet criteria for emergent surgery or inpatient admission.  I did talk to Dr. August Saucerean to attempt to expedite her outpatient evaluation.  Placed consult to case management.  She has not been taking any pain medications at home, and feel that giving pain medications as indicated in the setting of her having limited mobility due to pain.  [facility] [place] [facility], with no prior prescriptions for narcotic medications.  Given a dose of oxycodone, Tylenol, lidocaine patch in the emergency department, prescription for lidocaine patch, tramadol with recommendation of use of Tylenol. Discussed risks of narcotic medications.   Attempted to discharge patient but she reports she is  unable to ambulate and care was transferred to Dr. Lockie Molauratolo.  Final Clinical Impressions(s) / ED Diagnoses   Final diagnoses:  Right hip pain  Primary osteoarthritis of right hip    ED Discharge Orders         Ordered    lidocaine (LIDODERM) 5 %  Every 24 hours     01/11/19 2118    traMADol (ULTRAM) 50 MG tablet  Every 6 hours PRN     01/11/19 2118           Alvira MondaySchlossman, Trenton Passow, MD 01/11/19 2142    Alvira MondaySchlossman, Hansford Hirt, MD 01/15/19 2008

## 2019-01-11 NOTE — ED Triage Notes (Signed)
Pt arrived via ems with c/o right hip pain. Per ems, patient has had ongoing right hip pain since she fell on it 6 years ago. Pt states she has a "cracked hip". Was supposed to have hip surgery this summer, but was postponed due to stroke in July.  Per ems patients hip has been popping in and out place for 3 days. Pain and discomfort has been worsening. Lower leg edema as well. Pt stopped taking her lasix x2 days since she became immobile due to pain.  10/10 pain

## 2019-01-12 ENCOUNTER — Emergency Department (HOSPITAL_COMMUNITY): Payer: Medicare Other

## 2019-01-12 DIAGNOSIS — M25551 Pain in right hip: Secondary | ICD-10-CM | POA: Diagnosis not present

## 2019-01-12 DIAGNOSIS — M1611 Unilateral primary osteoarthritis, right hip: Secondary | ICD-10-CM | POA: Diagnosis not present

## 2019-01-12 LAB — CBG MONITORING, ED
Glucose-Capillary: 156 mg/dL — ABNORMAL HIGH (ref 70–99)
Glucose-Capillary: 138 mg/dL — ABNORMAL HIGH (ref 70–99)
Glucose-Capillary: 146 mg/dL — ABNORMAL HIGH (ref 70–99)
Glucose-Capillary: 169 mg/dL — ABNORMAL HIGH (ref 70–99)

## 2019-01-12 LAB — BASIC METABOLIC PANEL
Anion gap: 9 (ref 5–15)
BUN: 7 mg/dL — ABNORMAL LOW (ref 8–23)
CO2: 27 mmol/L (ref 22–32)
Calcium: 8.8 mg/dL — ABNORMAL LOW (ref 8.9–10.3)
Chloride: 104 mmol/L (ref 98–111)
Creatinine, Ser: 0.81 mg/dL (ref 0.44–1.00)
GFR calc Af Amer: >60 mL/min (ref >60)
GFR calc non Af Amer: >60 mL/min (ref >60)
Glucose, Bld: 226 mg/dL — ABNORMAL HIGH (ref 70–99)
Potassium: 3.4 mmol/L — ABNORMAL LOW (ref 3.5–5.1)
Sodium: 140 mmol/L (ref 135–145)

## 2019-01-12 LAB — CBC WITH DIFFERENTIAL/PLATELET
Abs Immature Granulocytes: 0.03 10*3/uL (ref 0.00–0.07)
Basophils Absolute: 0 10*3/uL (ref 0.0–0.1)
Basophils Relative: 0 %
Eosinophils Absolute: 0.2 10*3/uL (ref 0.0–0.5)
Eosinophils Relative: 3 %
HCT: 36.7 % (ref 36.0–46.0)
Hemoglobin: 11.8 g/dL — ABNORMAL LOW (ref 12.0–15.0)
Immature Granulocytes: 0 %
Lymphocytes Relative: 27 %
Lymphs Abs: 2.5 10*3/uL (ref 0.7–4.0)
MCH: 29.3 pg (ref 26.0–34.0)
MCHC: 32.2 g/dL (ref 30.0–36.0)
MCV: 91.1 fL (ref 80.0–100.0)
Monocytes Absolute: 0.8 10*3/uL (ref 0.1–1.0)
Monocytes Relative: 9 %
Neutro Abs: 5.6 10*3/uL (ref 1.7–7.7)
Neutrophils Relative %: 61 %
Platelets: 246 10*3/uL (ref 150–400)
RBC: 4.03 MIL/uL (ref 3.87–5.11)
RDW: 14.1 % (ref 11.5–15.5)
WBC: 9.3 10*3/uL (ref 4.0–10.5)
nRBC: 0 % (ref 0.0–0.2)

## 2019-01-12 MED ORDER — LORAZEPAM 1 MG PO TABS
1.0000 mg | ORAL_TABLET | Freq: Once | ORAL | Status: AC
  Administered 2019-01-12: 1 mg via ORAL
  Filled 2019-01-12: qty 1

## 2019-01-12 MED ORDER — OXYCODONE HCL 5 MG PO TABS
5.0000 mg | ORAL_TABLET | Freq: Once | ORAL | Status: AC
  Administered 2019-01-12: 5 mg via ORAL
  Filled 2019-01-12: qty 1

## 2019-01-12 NOTE — ED Notes (Signed)
Patient is resting comfortably. Returned to room.

## 2019-01-12 NOTE — ED Notes (Addendum)
Pt on phone w/Shirley, her sister - [phone].

## 2019-01-12 NOTE — ED Notes (Signed)
Patient transported to MRI 

## 2019-01-12 NOTE — ED Notes (Signed)
Breakfast ordered 

## 2019-01-12 NOTE — ED Notes (Signed)
Ebony Hail, Pharmacist, to review Lidocaine Patch order - patch removed from pt.

## 2019-01-12 NOTE — [place] FL2 (Signed)
[place] MEDICAID FL2 LEVEL OF CARE SCREENING TOOL     IDENTIFICATION  Patient Name: [name] Birthdate: 07-15-1945 Sex: female Admission Date (Current Location): 01/11/2019  [place]   Facility and Address:  [facility], [address], [place], [place] [address]      Provider Number: [id number]  Attending Physician Name and Address:  Default, Provider, MD  Relative Name and Phone Number:  Cyndia BentSteve Garner, nephew, [phone]    Current Level of Care: Hospital Recommended Level of Care: Skilled Nursing Facility Prior Approval Number:    Date Approved/Denied:   PASRR Number: [id number]  Discharge Plan: Home    Current Diagnoses: Patient Active Problem List   Diagnosis Date Noted  . Acute pain of left knee   . Fall 11/21/2018  . Carotid stenosis, right 11/18/2018  . Diabetes mellitus type II, uncontrolled (HCC) 11/18/2018  . Family hx-stroke 11/18/2018  . Cerebral infarction due to stenosis of right carotid artery (HCC) s/p tPA 11/15/2018  . Hyperlipidemia 01/14/2018  . COPD (chronic obstructive pulmonary disease) (HCC) 01/14/2018  . Angina pectoris (HCC) 07/28/2016  . Chronic diastolic congestive heart failure (HCC) 07/28/2016  . Hypertensive heart disease with heart failure (HCC) 07/28/2016  . Diabetic polyneuropathy associated with type 2 diabetes mellitus (HCC) 11/30/2015    Orientation RESPIRATION BLADDER Height & Weight     Self, Time, Situation, Place  Normal, Other (Comment)(Pt. reports being on 2L of O2 in the past and "took self off of it.") Continent Weight: 202 lb (91.6 kg) Height:  5\' 3"  (160 cm)  BEHAVIORAL SYMPTOMS/MOOD NEUROLOGICAL BOWEL NUTRITION STATUS  Dangerous to self, others or property   Continent    AMBULATORY STATUS COMMUNICATION OF NEEDS Skin   Limited Assist Verbally Normal                       Personal Care Assistance Level of Assistance  Bathing, Feeding, Dressing  Bathing Assistance: Limited assistance Feeding assistance: Limited assistance Dressing Assistance: Limited assistance     Functional Limitations Info             SPECIAL CARE FACTORS FREQUENCY  PT (By licensed PT), OT (By licensed OT)     PT Frequency: 5x weekly OT Frequency: 5x weekly            Contractures Contractures Info: Not present    Additional Factors Info  Code Status Code Status Info: Prior             Current Medications (01/12/2019):  This is the current hospital active medication list Current Facility-Administered Medications  Medication Dose Route Frequency Provider Last Rate Last Dose  . atorvastatin (LIPITOR) tablet 80 mg  80 mg Oral QHS Curatolo, Adam, DO   80 mg at 01/11/19 2301  . clopidogrel (PLAVIX) tablet 75 mg  75 mg Oral q morning - 10a Curatolo, Adam, DO   75 mg at 01/12/19 0814  . ezetimibe (ZETIA) tablet 10 mg  10 mg Oral q morning - 10a Curatolo, Adam, DO      . gabapentin (NEURONTIN) capsule 300 mg  300 mg Oral TID Curatolo, Adam, DO   300 mg at 01/12/19 [id number]  . hydrALAZINE (APRESOLINE) tablet 25 mg  25 mg Oral TID Virgina Norfolkuratolo, Adam, DO   25 mg at 01/12/19 0813  . ibuprofen (ADVIL) tablet 400 mg  400 mg Oral Q6H PRN Curatolo, Adam, DO      . insulin aspart (novoLOG)  injection 3 Units  3 Units Subcutaneous TID WC Curatolo, Adam, DO   3 Units at 01/12/19 0815  . insulin glargine (LANTUS) injection 10 Units  10 Units Subcutaneous BID Virgina Norfolk, DO   10 Units at 01/12/19 0816  . lidocaine (LIDODERM) 5 % 1 patch  1 patch Transdermal Q24H Alvira Monday, MD   1 patch at 01/11/19 1902  . linagliptin (TRADJENTA) tablet 5 mg  5 mg Oral Daily Curatolo, Adam, DO   5 mg at 01/12/19 0816  . lisinopril (ZESTRIL) tablet 40 mg  40 mg Oral QHS Curatolo, Adam, DO   40 mg at 01/11/19 2302  . rOPINIRole (REQUIP) tablet 1 mg  1 mg Oral QHS Curatolo, Adam, DO   1 mg at 01/11/19 2300  . torsemide (DEMADEX) tablet 100 mg  100 mg Oral BID Curatolo, Adam, DO    100 mg at 01/12/19 0814  . traMADol (ULTRAM) tablet 50 mg  50 mg Oral Q12H PRN Curatolo, Adam, DO   50 mg at 01/12/19 3716   Current Outpatient Medications  Medication Sig Dispense Refill  . atorvastatin (LIPITOR) 80 MG tablet Take 80 mg by mouth at bedtime.     . Calcium Carb-Cholecalciferol (CALCIUM 500+D PO) Take 1 tablet by mouth every morning.    . Cholecalciferol (VITAMIN D3 PO) Take 1 tablet by mouth every morning.    . clopidogrel (PLAVIX) 75 MG tablet Take 75 mg by mouth every morning.     . ezetimibe (ZETIA) 10 MG tablet Take 10 mg by mouth every morning.     . gabapentin (NEURONTIN) 300 MG capsule Take 1 capsule (300 mg total) by mouth at bedtime. (Patient taking differently: Take 300 mg by mouth 3 (three) times daily. ) 30 capsule 0  . hydrALAZINE (APRESOLINE) 25 MG tablet Take 25 mg by mouth 3 (three) times daily.    Marland Kitchen ibuprofen (ADVIL) 200 MG tablet Take 400 mg by mouth every 6 (six) hours as needed for headache (pain).    . insulin glargine (LANTUS) 100 UNIT/ML injection Inject 0.2 mLs (20 Units total) into the skin daily. (Patient taking differently: Inject 20 Units into the skin 2 (two) times daily. ) 10 mL 0  . insulin lispro (HUMALOG) 100 UNIT/ML injection Inject 0.03 mLs (3 Units total) into the skin 3 (three) times daily with meals. 10 mL 0  . lisinopril (ZESTRIL) 40 MG tablet Take 0.5 tablets (20 mg total) by mouth daily. (Patient taking differently: Take 40 mg by mouth at bedtime. )    . nitroGLYCERIN (NITROSTAT) 0.4 MG SL tablet Place 0.4 mg under the tongue every 5 (five) minutes as needed for chest pain.     Marland Kitchen olmesartan (BENICAR) 40 MG tablet Take 40 mg by mouth every morning.    Marland Kitchen rOPINIRole (REQUIP) 1 MG tablet Take 1 mg by mouth at bedtime.     . sitaGLIPtin (JANUVIA) 100 MG tablet Take 100 mg by mouth every morning.     . torsemide (DEMADEX) 100 MG tablet Take 100 mg by mouth 2 (two) times daily.     . clotrimazole (GYNE-LOTRIMIN) 1 % vaginal cream Place 1  Applicatorful vaginally at bedtime. (Patient not taking: Reported on 12/18/2018) 45 g 0  . glimepiride (AMARYL) 4 MG tablet Take 1 tablet (4 mg total) by mouth 2 (two) times daily with a meal. (Patient not taking: Reported on 01/11/2019) 60 tablet 2  . lidocaine (LIDODERM) 5 % Place 1 patch onto the skin daily. Remove & Discard patch  within 12 hours or as directed by MD 30 patch 0  . traMADol (ULTRAM) 50 MG tablet Take 1 tablet (50 mg total) by mouth every 6 (six) hours as needed. 15 tablet 0     Discharge Medications: Please see discharge summary for a list of discharge medications.  Relevant Imaging Results:  Relevant Lab Results:   Additional [place], LCSW

## 2019-01-12 NOTE — ED Provider Notes (Signed)
ED observation holding note  73 year old female who is been in this department for greater than 22 hours at this time.  Originally presented for hip pain on 01/11/2019.  Medically cleared by previous team currently awaiting skilled nursing facility placement.  CBG 138 BMP nonacute CBC nonacute DG Pelvis:  IMPRESSION:  Advanced bilateral hip osteoarthritis, right greater than left.    If there is high clinical suspicion for occult hip fracture or the  patient refuses to weightbear, consider further evaluation with MRI.  Although CT is expeditious, evidence is lacking regarding accuracy  of CT over plain film radiography.     MRI Right Hip:  IMPRESSION:  1. Advanced right hip osteoarthritis with significant reactive  marrow changes in the femoral head and femoral neck. No definite  osseous fracture.  2. Moderate right hip joint effusion.  3. Right hip trochanteric bursitis  4. Bilateral fatty atrophy of the musculature.   Physical Exam  BP 126/84 (BP Location: Left Arm)   Pulse 84   Temp 97.9 F (36.6 C) (Oral)   Resp 18   Ht 5\' 3"  (1.6 m)   Wt 91.6 kg   SpO2 99%   BMI 35.78 kg/m   Physical Exam Constitutional:      General: She is not in acute distress.    Appearance: Normal appearance. She is well-developed. She is not ill-appearing or diaphoretic.  HENT:     Head: Normocephalic and atraumatic.     Right Ear: External ear normal.     Left Ear: External ear normal.     Nose: Nose normal.  Eyes:     General: Vision grossly intact. Gaze aligned appropriately.     Pupils: Pupils are equal, round, and reactive to light.  Neck:     Musculoskeletal: Normal range of motion.     Trachea: Trachea and phonation normal. No tracheal deviation.  Pulmonary:     Effort: Pulmonary effort is normal. No respiratory distress.  Musculoskeletal: Normal range of motion.  Skin:    General: Skin is warm and dry.  Neurological:     Mental Status: She is alert.     GCS: GCS eye  subscore is 4. GCS verbal subscore is 5. GCS motor subscore is 6.     Comments: Speech is clear and goal oriented, follows commands Major Cranial nerves without deficit, no facial droop Moves extremities without ataxia, coordination intact  Psychiatric:        Behavior: Behavior normal.    ED Course/Procedures   Clinical Course as of Jan 11 1546  Sun Jan 12, 2019  1546 Platelets: 246 [BM]    Clinical Course User Index [BM] Deliah Boston, PA-C    Procedures  MDM  3:45 PM: Patient assessed while lying in bed resting comfortably and in no acute distress.  She is talking with family members on the phone.  She reports that she is feeling well but has some increase in her right hip pain, she reports last pain medication given to her oxycodone was 8:40 PM last night she is requesting repeat dose today.  Feel this is reasonable at this time will order 1 dose of oxycodone for patient's right hip pain.  Patient remains medically cleared at this time.  Patient continues to await skilled nursing facility placement.   Note: Portions of this report may have been transcribed using voice recognition software. Every effort was made to ensure accuracy; however, inadvertent computerized transcription errors may still be present.   Deliah Boston,  PA-C 01/12/19 1550    Gerhard MunchLockwood, Robert, MD 01/13/19 1424

## 2019-01-12 NOTE — Progress Notes (Signed)
CSW acknowledges consult for patient home environment safety issues. CSW will follow pending PT evaluation to determine appropriate follow-up. CSW will consult with case management regarding wheelchair.   Lamonte Richer, LCSW, [place] Worker II [phone]

## 2019-01-12 NOTE — Progress Notes (Signed)
Pt was seen for mobility assessment and note her willingness to work is very good.  Pt reports she is waiting for R THA and is in severe pain, and is interested in rehab stay.  Given her lack of independence and lack of home help, should be considered for rehab stay if this is possible.  Follow acutely for progressing gait and standing balance as pt is now demonstrating a higher fall risk with a recent visit to hosp for fall onto L knee.    01/12/19 1100  PT Visit Information  Last PT Received On 01/12/19  Assistance Needed +1 (2 helpful for longer walk)  History of Present Illness 73 y/o female with history of falls was presented to ED for management of hip pain and poor tolerance for gait.  Pt has reported being in a waiting status for her THA, had consult Sept 19.  PMHx:  OA, L hemiparesis from CVA, falls, L knee pain, CHF, DM, HTN   Precautions  Precautions Fall  Precaution Comments has limited help at home  Restrictions  Weight Bearing Restrictions No  Home Living  Family/patient expects to be discharged to: Private residence  Living Arrangements Spouse/significant other  Available Help at Discharge Family;Available 24 hours/day  Type of Home House  Home Access Stairs to enter  Entrance Stairs-Number of Steps 3 in front; 1 in from the car port; 1 on the patio  Entrance Stairs-Rails Right  Home Layout One level  Bathroom Shower/Tub Tub/shower unit  [employer] Toilet Handicapped height  Bathroom Accessibility No  [place] - 2 wheels;Walker - 4 wheels;Shower seat;Adaptive equipment  Additional Comments pt has accessibility issues of BR opening as her walker will not fit in the door  Prior Function  Level of Independence Independent with assistive device(s)  Comments has lost balance over R hip and L knee  Communication  Communication No difficulties  Pain Assessment  Pain Assessment 0-10  Pain Score 9  Pain Location R hip   Pain Descriptors / Indicators Grimacing   Pain Intervention(s) Limited activity within patient's tolerance;Monitored during session;Patient requesting pain meds-RN notified;Repositioned  Cognition  Arousal/Alertness Awake/alert  Behavior During Therapy WFL for tasks assessed/performed  Overall Cognitive Status Within Functional Limits for tasks assessed  Upper Extremity Assessment  Upper Extremity Assessment Generalized weakness  Lower Extremity Assessment  Lower Extremity Assessment Generalized weakness;RLE deficits/detail  RLE Deficits / Details restricted mobility and pain to the groin wth WB on RLE  RLE Unable to fully assess due to pain  RLE Coordination decreased fine motor;decreased gross motor  Cervical / Trunk Assessment  Cervical / Trunk Assessment Kyphotic  Bed Mobility  Overal bed mobility Needs Assistance  Bed Mobility Supine to Sit;Sit to Supine  Supine to sit Mod assist  Sit to supine Max assist  General bed mobility comments mod to assist trunk up and max to lift legs and reposition upper body  Transfers  Overall transfer level Needs assistance  Equipment used Rolling walker (2 wheeled);1 person hand held assist  Transfers Sit to/from Stand  Sit to Stand Mod assist;From elevated surface  General transfer comment pt is asking to use walker itself to stand and finally agreed to use bed to push but was more successful in that way  Ambulation/Gait  Ambulation/Gait assistance Mod assist  Gait Distance (Feet) 2 Feet  Assistive device Rolling walker (2 wheeled);1 person hand held assist  Gait Pattern/deviations Step-to pattern;Decreased stride length;Wide base of support;Trunk flexed;Decreased weight shift to right  General Gait Details  pt cannot tolerate RLE WB and slides the foot on the floor alongside the bed  Gait velocity reduced  Gait velocity interpretation <1.8 ft/sec, indicate of risk for recurrent falls  Stairs  (not feasible)  Modified Rankin (Stroke Patients Only)  Pre-Morbid Rankin Score 3   Modified Rankin 4  Balance  Overall balance assessment History of Falls;Needs assistance  Sitting-balance support Feet supported;Bilateral upper extremity supported  Sitting balance-Leahy Scale Fair  Postural control Posterior lean  Standing balance support Bilateral upper extremity supported;During functional activity  Standing balance-Leahy Scale Poor  General Comments  General comments (skin integrity, edema, etc.) pt is in need of pain meds when PT arrived and asked her to move but is also deconditioned for use of core and to maneuver her legs to assist bed mob and standing, although very willing to try  PT - End of Session  Equipment Utilized During Treatment Gait belt  Activity Tolerance Patient limited by fatigue;Patient limited by pain  Patient left in bed;with call bell/phone within reach  Nurse Communication Mobility status;Other (comment) (need for follow up rehab and difficulty with her wait for sx)  PT Assessment  PT Recommendation/Assessment Patient needs continued PT services  PT Visit Diagnosis Unsteadiness on feet (R26.81);Muscle weakness (generalized) (M62.81);Repeated falls (R29.6);Pain;Hemiplegia and hemiparesis  Hemiplegia - Right/Left Left  Hemiplegia - dominant/non-dominant Non-dominant  Hemiplegia - caused by Unspecified  Pain - Right/Left Right  Pain - part of body Hip  PT Problem List Decreased strength;Decreased range of motion;Decreased activity tolerance;Decreased balance;Decreased mobility;Decreased coordination;Decreased knowledge of use of DME;Decreased safety awareness;Cardiopulmonary status limiting activity;Obesity;Pain  Barriers to Discharge Inaccessible home environment;Decreased caregiver support  Barriers to Discharge Comments home with stairs and husband is disabled  PT Plan  PT Frequency (ACUTE ONLY) Min 2X/week  PT Treatment/Interventions (ACUTE ONLY) DME instruction;Gait training;Stair training;Functional mobility training;Therapeutic  activities;Therapeutic exercise;Balance training;Neuromuscular re-education;Patient/family education  AM-PAC PT "6 Clicks" Mobility Outcome Measure (Version 2)  Help needed turning from your back to your side while in a flat bed without using bedrails? 2  Help needed moving from lying on your back to sitting on the side of a flat bed without using bedrails? 2  Help needed moving to and from a bed to a chair (including a wheelchair)? 2  Help needed standing up from a chair using your arms (e.g., wheelchair or bedside chair)? 2  Help needed to walk in hospital room? 2  Help needed climbing 3-5 steps with a railing?  1  6 Click Score 11  Consider Recommendation of Discharge To: CIR/SNF/LTACH  PT Recommendation  Follow Up Recommendations SNF  PT equipment None recommended by PT  Individuals Consulted  Consulted and Agree with Results and Recommendations Patient  Acute Rehab PT Goals  Patient Stated Goal to get R hip pain managed  PT Goal Formulation With patient  Time For Goal Achievement 01/26/19  Potential to Achieve Goals Good  PT Time Calculation  PT Start Time (ACUTE ONLY) 1006  PT Stop Time (ACUTE ONLY) 1033  PT Time Calculation (min) (ACUTE ONLY) 27 min  PT General Charges  $$ ACUTE PT VISIT 1 Visit  PT Evaluation  $PT Eval Moderate Complexity 1 Mod  PT Treatments  $Therapeutic Activity 8-22 mins  Written Expression  Dominant Hand Right    Samul Dadauth Payson Crumby, PT MS Acute Rehab Dept. Number: [facility] [phone] and [facility] [phone]

## 2019-01-12 NOTE — ED Notes (Signed)
Gloris Ham, PA, aware pt requesting "Oxy" to be given as she had received prior as this helped her w/her pain the most. Order received.

## 2019-01-12 NOTE — ED Notes (Signed)
Pt stating she wants to go to [facility] pt as SW had advised pt, SW will inquire w/[facility] to see if beds available, however, if no bed available, will seek other facilities and that she may possibly need to seek SNF placement from home if ED is unable to find facility. Voiced understanding.

## 2019-01-12 NOTE — ED Notes (Signed)
Pt now awake - on phone w/family member.

## 2019-01-12 NOTE — TOC Initial Note (Signed)
Transition of Care [facility](TOC) - Initial/Assessment Note    Patient Details  Name: [name] MRN: [id number] Date of Birth: [birth year]  Transition of [facility](TOC) CM/SW Contact:    Annalee GentaJoey R Dashaun Onstott, LCSW Phone Number: 01/12/2019, 10:09 AM  Clinical Narrative:  CSW spoke with patient regarding transitioning to community care. CSW noted patient reported she has been physically declining and has been unable to take care of herself and husband at home. CSW noted per patient's report she had an electric scooter with no barriers, a wheelchair, and a walker at home but her residence is too narrow for her to navigate in a wheelchair. CSW noted she owned another property which is wheelchair accessible and that she is in the process of evicting the residents as they refuse to leave and their is no lease.  CSW notes patient is open to SNF placement and would prefer to stay around [place] near her home. CSW notes patient reports she is open to SNF and was at [facility] in the past and would prefer if they were accepting patients. CSW will fax patient out for referral.    Expected Discharge Plan: Skilled Nursing Facility Barriers to Discharge: Insurance Authorization   Patient Goals and CMS Choice Patient states their goals for this hospitalization and ongoing recovery are:: "I eventually want to be able to go home, but I need to get stronger again with my hip." CMS Medicare.gov Compare Post Acute Care list provided to:: Patient Choice offered to / list presented to : Patient  Expected Discharge Plan and Services Expected Discharge Plan: Skilled Nursing Facility   Discharge Planning Services: CM Consult(Patient may need new wheelchair, has wheelchair from 1998 but may be too small or worn out.) Post Acute Care Choice: Skilled Nursing Facility Living arrangements for the past 2 months: Single Family Home                                      Prior Living Arrangements/Services Living arrangements  for the past 2 months: Single Family Home Lives with:: Spouse Patient language and need for interpreter reviewed:: No Do you feel safe going back to the place where you live?: Yes      Need for Family Participation in Patient Care: No (Comment) Care giver support system in place?: No (comment) Current home services: DME Criminal Activity/Legal Involvement Pertinent to Current Situation/Hospitalization: No - Comment as needed  Activities of Daily Living      Permission Sought/Granted Permission sought to share information with : Facility Medical sales representativeContact Representative, Family Supports Permission granted to share information with : Yes, Verbal Permission Granted  Share Information with NAME: Cyndia BentSteve Garner  Permission granted to share info w AGENCY: SNF facility contacts  Permission granted to share info w Relationship: Nephew     Emotional Assessment Appearance:: Appears stated age Attitude/Demeanor/Rapport: Charismatic Affect (typically observed): Accepting, Adaptable, Appropriate Orientation: : Oriented to Self, Oriented to Place, Oriented to  Time, Oriented to Situation Alcohol / Substance Use: Not Applicable Psych Involvement: No (comment)  Admission diagnosis:  Hip Pain Patient Active Problem List   Diagnosis Date Noted  . Acute pain of left knee   . Fall 11/21/2018  . Carotid stenosis, right 11/18/2018  . Diabetes mellitus type II, uncontrolled (HCC) 11/18/2018  . Family hx-stroke 11/18/2018  . Cerebral infarction due to stenosis of right carotid artery (HCC) s/p tPA 11/15/2018  . Hyperlipidemia 01/14/2018  . COPD (  chronic obstructive pulmonary disease) ([place]) 01/14/2018  . Angina pectoris ([place]) 07/28/2016  . Chronic diastolic congestive heart failure ([place]) 07/28/2016  . Hypertensive heart disease with heart failure ([place]) 07/28/2016  . Diabetic polyneuropathy associated with type 2 diabetes mellitus ([place]) 11/30/2015   PCP:  Nicoletta Dress, MD Pharmacy:   [facility], [place] [address] Phone: [phone] Fax: [phone]  [facility] [place], [place] [address] [place] [address] Phone: [phone] Fax: [phone]     Social Determinants of Health (SDOH) Interventions    Readmission Risk Interventions No flowsheet data found.

## 2019-01-12 NOTE — ED Notes (Signed)
PT in w/pt.  

## 2019-01-12 NOTE — ED Provider Notes (Signed)
Care assumed from Dr. Ronnald Nian, patient being sent to MRI of her right hip to rule out occult fracture.  MRI shows evidence of osteoarthritis but no fracture seen.  She will be held in the ED overnight for care management and social work to evaluate her.   Delora Fuel, MD 95/09/32 [phone]

## 2019-01-12 NOTE — ED Notes (Signed)
PureWick changed. Pt tolerated well.

## 2019-01-12 NOTE — TOC Progression Note (Addendum)
Transition of Care [facility]) - Progression Note    Patient Details  Name: [name] MRN: [id number] Date of Birth: 1946-03-21  Transition of Care [facility]) CM/SW Contact  Erenest Rasher, RN Phone Number: [phone] 01/12/2019, 10:42 AM  Clinical Narrative:     Received consult for possible dc back home with HH/wheelchair. Per [facility] CSW notes, pt has wheelchair and power scooter at home. Per [facility] note, pt is currently active with [facility]. CSW will continue to follow for dc needs, SNF vs HH. Waiting PT recommendations for home.   Expected Discharge Plan: Skilled Nursing Facility Barriers to Discharge: Insurance Authorization  Expected Discharge Plan and Services Expected Discharge Plan: [place]   Discharge Planning Services: CM Consult(Patient may need new wheelchair, has wheelchair from [place] but may be too small or worn out.) Post Acute Care Choice: [place] arrangements for the past 2 months: Single Family Home                                       Social Determinants of Health (SDOH) Interventions    Readmission Risk Interventions No flowsheet data found.

## 2019-01-13 DIAGNOSIS — Z7902 Long term (current) use of antithrombotics/antiplatelets: Secondary | ICD-10-CM | POA: Diagnosis not present

## 2019-01-13 DIAGNOSIS — R52 Pain, unspecified: Secondary | ICD-10-CM | POA: Diagnosis not present

## 2019-01-13 DIAGNOSIS — E1142 Type 2 diabetes mellitus with diabetic polyneuropathy: Secondary | ICD-10-CM | POA: Diagnosis not present

## 2019-01-13 DIAGNOSIS — W19XXXD Unspecified fall, subsequent encounter: Secondary | ICD-10-CM | POA: Diagnosis not present

## 2019-01-13 DIAGNOSIS — E785 Hyperlipidemia, unspecified: Secondary | ICD-10-CM | POA: Diagnosis not present

## 2019-01-13 DIAGNOSIS — I63231 Cerebral infarction due to unspecified occlusion or stenosis of right carotid arteries: Secondary | ICD-10-CM | POA: Diagnosis not present

## 2019-01-13 DIAGNOSIS — I639 Cerebral infarction, unspecified: Secondary | ICD-10-CM | POA: Diagnosis not present

## 2019-01-13 DIAGNOSIS — M810 Age-related osteoporosis without current pathological fracture: Secondary | ICD-10-CM | POA: Diagnosis not present

## 2019-01-13 DIAGNOSIS — Z79899 Other long term (current) drug therapy: Secondary | ICD-10-CM | POA: Diagnosis not present

## 2019-01-13 DIAGNOSIS — Z7984 Long term (current) use of oral hypoglycemic drugs: Secondary | ICD-10-CM | POA: Diagnosis not present

## 2019-01-13 DIAGNOSIS — I959 Hypotension, unspecified: Secondary | ICD-10-CM | POA: Diagnosis not present

## 2019-01-13 DIAGNOSIS — E1152 Type 2 diabetes mellitus with diabetic peripheral angiopathy with gangrene: Secondary | ICD-10-CM | POA: Diagnosis not present

## 2019-01-13 DIAGNOSIS — I6521 Occlusion and stenosis of right carotid artery: Secondary | ICD-10-CM | POA: Diagnosis not present

## 2019-01-13 DIAGNOSIS — M2550 Pain in unspecified joint: Secondary | ICD-10-CM | POA: Diagnosis not present

## 2019-01-13 DIAGNOSIS — I5032 Chronic diastolic (congestive) heart failure: Secondary | ICD-10-CM | POA: Diagnosis not present

## 2019-01-13 DIAGNOSIS — M25551 Pain in right hip: Secondary | ICD-10-CM | POA: Diagnosis not present

## 2019-01-13 DIAGNOSIS — Z9181 History of falling: Secondary | ICD-10-CM | POA: Diagnosis not present

## 2019-01-13 DIAGNOSIS — I2510 Atherosclerotic heart disease of native coronary artery without angina pectoris: Secondary | ICD-10-CM | POA: Diagnosis not present

## 2019-01-13 DIAGNOSIS — I110 Hypertensive heart disease with heart failure: Secondary | ICD-10-CM | POA: Diagnosis not present

## 2019-01-13 DIAGNOSIS — J449 Chronic obstructive pulmonary disease, unspecified: Secondary | ICD-10-CM | POA: Diagnosis not present

## 2019-01-13 DIAGNOSIS — R262 Difficulty in walking, not elsewhere classified: Secondary | ICD-10-CM | POA: Diagnosis not present

## 2019-01-13 DIAGNOSIS — E1140 Type 2 diabetes mellitus with diabetic neuropathy, unspecified: Secondary | ICD-10-CM | POA: Diagnosis not present

## 2019-01-13 DIAGNOSIS — Z741 Need for assistance with personal care: Secondary | ICD-10-CM | POA: Diagnosis not present

## 2019-01-13 DIAGNOSIS — D649 Anemia, unspecified: Secondary | ICD-10-CM | POA: Diagnosis not present

## 2019-01-13 DIAGNOSIS — Z8673 Personal history of transient ischemic attack (TIA), and cerebral infarction without residual deficits: Secondary | ICD-10-CM | POA: Diagnosis not present

## 2019-01-13 DIAGNOSIS — Z794 Long term (current) use of insulin: Secondary | ICD-10-CM | POA: Diagnosis not present

## 2019-01-13 DIAGNOSIS — M25562 Pain in left knee: Secondary | ICD-10-CM | POA: Diagnosis not present

## 2019-01-13 DIAGNOSIS — M1611 Unilateral primary osteoarthritis, right hip: Secondary | ICD-10-CM | POA: Diagnosis not present

## 2019-01-13 DIAGNOSIS — I739 Peripheral vascular disease, unspecified: Secondary | ICD-10-CM | POA: Diagnosis not present

## 2019-01-13 DIAGNOSIS — I69954 Hemiplegia and hemiparesis following unspecified cerebrovascular disease affecting left non-dominant side: Secondary | ICD-10-CM | POA: Diagnosis not present

## 2019-01-13 DIAGNOSIS — Z87440 Personal history of urinary (tract) infections: Secondary | ICD-10-CM | POA: Diagnosis not present

## 2019-01-13 DIAGNOSIS — Z20828 Contact with and (suspected) exposure to other viral communicable diseases: Secondary | ICD-10-CM | POA: Diagnosis not present

## 2019-01-13 DIAGNOSIS — G2581 Restless legs syndrome: Secondary | ICD-10-CM | POA: Diagnosis not present

## 2019-01-13 DIAGNOSIS — I10 Essential (primary) hypertension: Secondary | ICD-10-CM | POA: Diagnosis not present

## 2019-01-13 DIAGNOSIS — I69354 Hemiplegia and hemiparesis following cerebral infarction affecting left non-dominant side: Secondary | ICD-10-CM | POA: Diagnosis not present

## 2019-01-13 DIAGNOSIS — Z7982 Long term (current) use of aspirin: Secondary | ICD-10-CM | POA: Diagnosis not present

## 2019-01-13 DIAGNOSIS — Z7401 Bed confinement status: Secondary | ICD-10-CM | POA: Diagnosis not present

## 2019-01-13 LAB — CBG MONITORING, ED
Glucose-Capillary: 187 mg/dL — ABNORMAL HIGH (ref 70–99)
Glucose-Capillary: 218 mg/dL — ABNORMAL HIGH (ref 70–99)

## 2019-01-13 LAB — SARS CORONAVIRUS 2 BY RT PCR (HOSPITAL ORDER, PERFORMED IN ~~LOC~~ HOSPITAL LAB): SARS Coronavirus 2: NEGATIVE

## 2019-01-13 NOTE — ED Notes (Addendum)
Regular Diet was ordered for Breakfast. 

## 2019-01-13 NOTE — ED Provider Notes (Signed)
Patient awaits SNIF placement.  Resting comfortably no apparent distress, no overnight events.   Renita Papa, PA-C 01/13/19 1353    Tegeler, Gwenyth Allegra, MD 01/13/19 [phone]

## 2019-01-13 NOTE — TOC Transition Note (Signed)
Transition of Care [facility]) - CM/SW Discharge Note   Patient Details  Name: ALEISHA PAONE MRN: [id number] Date of Birth: [birth year]  Transition of Care [facility]) CM/SW Contact:  Archie Endo, LCSW Phone Number: 01/13/2019, 10:48 AM   Clinical Narrative:    CSW spoke with Olivia Mackie at [facility] to request a review of this patient. Olivia Mackie stated the patient was accepted and could be admitted to the facility once a COVID test was obtained. CSW notified Emmy, RN and test was completed. Patient is COVID negative.  Patient will go to room 710. Patient will be transported via PT[facility] to [facility] in [place] at 1:30pm. The number to call for report is [phone] ext. [phone].   Final next level of care: Skilled Nursing Facility Barriers to Discharge: No Barriers Identified   Patient Goals and CMS Choice Patient states their goals for this hospitalization and ongoing recovery are:: "I eventually want to be able to go home, but I need to get stronger again with my hip" CMS Medicare.gov Compare Post Acute Care list provided to:: Patient Choice offered to / list presented to : Patient  Discharge Placement              Patient chooses bed at: [facility], [place] Patient to be transferred to facility by: [place] Name of family member notified: None Patient and family notified of of transfer: 01/13/19  Discharge Plan and Services   Discharge Planning Services: CM Consult(Patient may need new wheelchair, has wheelchair from [place] but may be too small or worn out.) Post Acute Care Choice: [place]                               Social Determinants of Health (SDOH) Interventions     Readmission Risk Interventions No flowsheet data found.

## 2019-01-13 NOTE — Progress Notes (Signed)
CSW placed call to [place] in [place] to request a review of this patient as that facility is the top choice for her SNF placement. CSW left voicemail and will wait for a return call.  Madilyn Fireman, MSW, LCSW-A Clinical Social Worker Transitions [facility] [phone]

## 2019-01-14 ENCOUNTER — Telehealth: Payer: Self-pay | Admitting: Adult Health

## 2019-01-14 ENCOUNTER — Other Ambulatory Visit: Payer: Self-pay | Admitting: *Deleted

## 2019-01-14 DIAGNOSIS — M25551 Pain in right hip: Secondary | ICD-10-CM | POA: Diagnosis not present

## 2019-01-14 DIAGNOSIS — E1152 Type 2 diabetes mellitus with diabetic peripheral angiopathy with gangrene: Secondary | ICD-10-CM | POA: Diagnosis not present

## 2019-01-14 DIAGNOSIS — I639 Cerebral infarction, unspecified: Secondary | ICD-10-CM | POA: Diagnosis not present

## 2019-01-14 DIAGNOSIS — R262 Difficulty in walking, not elsewhere classified: Secondary | ICD-10-CM | POA: Diagnosis not present

## 2019-01-14 NOTE — Patient Outreach (Signed)
[facility]) Care Management  01/14/2019  [name] April 24, 1946 [id number]   Care Coordination   Noted patient ED visit at [facility] 9/5 Right hip pain, Advanced right hip osteoarthritis, no definite fracture.  Patient was discharged to [facility] SNF in [place].   Plan Will close [facility] care managements programs, patient care will be managed by [facility].  Will sent in basket to Deitra Mayo , LCSW of patient disposition.    [name], RN, [place] Management Coordinator  [phone]- Mobile [phone]- Toll Free Main Office

## 2019-01-14 NOTE — Telephone Encounter (Signed)
Debbie Mayness(Director of Transportation @ [employer]) has called for [email or web address] vv for pt, she gave verbal consent to file insurance for vv  email sent to [email or web address]  With Debbie on line the email was sent to charge RN Nira Conn

## 2019-01-15 ENCOUNTER — Other Ambulatory Visit: Payer: Self-pay | Admitting: *Deleted

## 2019-01-15 ENCOUNTER — Ambulatory Visit: Payer: Self-pay | Admitting: *Deleted
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
## 2019-01-15 NOTE — Patient Outreach (Signed)
[facility]) Care Management  01/15/2019  [name] 10/25/1945 [id number]   CSW received an In basket message from [name], [facility] with [place] Management, indicating that patient was discharged from [facility] and transferred to [facility] in [place], for short-term rehabilitative services.  After thorough review of patient's EMR (Electronic Medical Record) in [place], [place] noted that patient presented to [facility] on Saturday, January 11, 2019, via EMS (Emergency Medical Service), due to right hip pain.  On Monday, January 13, 2019, patient was approved for admission into [facility] in [place] and was transported by [employer] [facility] to the skilled nursing facility from [place] will continue to follow patient while at [facility] in [place] and then resume community case management services once patient is discharged back home.  Nat Christen, BSW, MSW, LCSW  Licensed Education officer, [facility] Health System  Mailing [place] [address] [place], [place] [address] Physical Address-[address], [place], [place] [address] Toll Free Main # [phone] Fax # [phone] Cell # [phone]  Office # [phone] [email or web address]

## 2019-01-16 ENCOUNTER — Ambulatory Visit: Payer: Medicare Other | Admitting: *Deleted

## 2019-01-20 ENCOUNTER — Other Ambulatory Visit: Payer: Self-pay | Admitting: *Deleted

## 2019-01-20 NOTE — Patient Outreach (Signed)
[facility] PAC RN follow up.  [name] is currently receiving rehab therapy at [facility] in [place].  Telephone call made to [name] to discuss disposition plans at [phone]. However, there was no answer. Unable to leave voicemail message as "voicemail box not set up yet." Attempted to call on [phone] (just in case numbers were opposite of each other in system). No answer. HIPAA compliant voicemail message left.   Will attempt to call again at a later time. Will continue to collaborate with [facility] UM/CM teams and facility staff while [name] resides in SNF.    [name], MSN-Ed, RN,BSN [place] Acute Care Coordinator [phone] [facility]) [phone]  (Toll free office)

## 2019-01-21 ENCOUNTER — Inpatient Hospital Stay: Payer: Self-pay | Admitting: Adult Health

## 2019-01-21 ENCOUNTER — Other Ambulatory Visit: Payer: Self-pay | Admitting: *Deleted

## 2019-01-21 NOTE — Patient Outreach (Signed)
[facility] PAC follow up.  Collaboration with facility Geographical information systems officer. Mrs. Bartoletti remains in [facility] receiving rehab therapy.  Made dc planner aware of writer's attempted outreach on yesterday. Dc planner indicates she is trying to get Mrs. Hiltner into ALF pending what she is able to afford. Writer shared with Geographical information systems officer conversation notes with member on 01/06/19 with [facility] RNCM. Shared that son has offered for member to move with him but Mrs. Beyersdorf "did not want to be a bother." Asked dc planner to explore this option with member again since writer unable to reach at this time and in case ALF does not pan out. Member previously indicated with [facility] CM team that she was unable to afford ALF. Writer shared this info with [facility] dc planner.   Will continue to follow and collaborate with facility and [facility] CM/UM teams.    [name], MSN-Ed, RN,BSN [place] Acute Care Coordinator [phone] [facility]) [phone]  (Toll free office)

## 2019-01-23 ENCOUNTER — Other Ambulatory Visit: Payer: Self-pay | Admitting: *Deleted

## 2019-01-23 NOTE — Patient Outreach (Signed)
Late entry from 01/22/19 at 2 pm  Member assessed for potential [facility] Care Management needs as a benefit of  [place] Medicare.  Member is currently receiving rehab therapy at [facility].  Member discussed in weekly telephonic IDT meeting with facility staff, [facility] UM team, and writer.  Facility reports they are diligently trying to get Mrs. Coor into ALF post SNF stay. States they have spoken to Mrs. Plotz's son and he is also on board with ALF placement. However, finances may be an issue. Discussed whether moving in with the son could be a back up plan. Facility states the son works during the day. They remain hopeful that the plan for ALF will work out.   Also noted that Mrs. Macaluso will need neurology clearance for hip surgery.   Writer will follow up with dc planner by  Monday to confirm disposition plans. Will update [facility] CM team accordingly.   [name], MSN-Ed, RN,BSN [place] Acute Care Coordinator [phone] [facility]) [phone]  (Toll free office)

## 2019-01-24 DIAGNOSIS — M1611 Unilateral primary osteoarthritis, right hip: Secondary | ICD-10-CM | POA: Diagnosis not present

## 2019-01-27 ENCOUNTER — Encounter: Payer: Self-pay | Admitting: Adult Health

## 2019-01-27 ENCOUNTER — Inpatient Hospital Stay: Payer: Self-pay | Admitting: Adult Health

## 2019-01-27 ENCOUNTER — Telehealth: Payer: Self-pay

## 2019-01-27 NOTE — Telephone Encounter (Signed)
Patient was a no call/no show for their appointment today.   
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
## 2019-01-28 ENCOUNTER — Other Ambulatory Visit: Payer: Self-pay | Admitting: *Deleted

## 2019-01-28 ENCOUNTER — Ambulatory Visit: Payer: Self-pay | Admitting: Adult Health

## 2019-01-28 NOTE — Patient Outreach (Signed)
[facility] PAC follow up.  Received update from [place] planner who indicated Mrs. Worland will return home with husband and  home health services instead of going to ALF.   Member slated to dc from [facility] 01/28/19. Updated [place] Management team of the latest developments.   [name], MSN-Ed, RN,BSN [place] Acute Care Coordinator [phone] [facility]) [phone]  (Toll free office)

## 2019-01-29 ENCOUNTER — Other Ambulatory Visit: Payer: Self-pay | Admitting: *Deleted

## 2019-01-29 DIAGNOSIS — R4182 Altered mental status, unspecified: Secondary | ICD-10-CM | POA: Diagnosis not present

## 2019-01-29 DIAGNOSIS — Z209 Contact with and (suspected) exposure to unspecified communicable disease: Secondary | ICD-10-CM | POA: Diagnosis not present

## 2019-01-29 DIAGNOSIS — I499 Cardiac arrhythmia, unspecified: Secondary | ICD-10-CM | POA: Diagnosis not present

## 2019-01-29 DIAGNOSIS — N179 Acute kidney failure, unspecified: Secondary | ICD-10-CM | POA: Diagnosis not present

## 2019-01-29 DIAGNOSIS — Z8673 Personal history of transient ischemic attack (TIA), and cerebral infarction without residual deficits: Secondary | ICD-10-CM | POA: Diagnosis not present

## 2019-01-29 DIAGNOSIS — R8290 Unspecified abnormal findings in urine: Secondary | ICD-10-CM | POA: Diagnosis not present

## 2019-01-29 DIAGNOSIS — B9689 Other specified bacterial agents as the cause of diseases classified elsewhere: Secondary | ICD-10-CM | POA: Diagnosis not present

## 2019-01-29 DIAGNOSIS — R404 Transient alteration of awareness: Secondary | ICD-10-CM | POA: Diagnosis not present

## 2019-01-29 DIAGNOSIS — I959 Hypotension, unspecified: Secondary | ICD-10-CM | POA: Diagnosis not present

## 2019-01-29 DIAGNOSIS — R55 Syncope and collapse: Secondary | ICD-10-CM | POA: Diagnosis not present

## 2019-01-29 DIAGNOSIS — R4020 Unspecified coma: Secondary | ICD-10-CM | POA: Diagnosis not present

## 2019-01-29 DIAGNOSIS — I2510 Atherosclerotic heart disease of native coronary artery without angina pectoris: Secondary | ICD-10-CM | POA: Diagnosis not present

## 2019-01-29 DIAGNOSIS — N3000 Acute cystitis without hematuria: Secondary | ICD-10-CM | POA: Diagnosis not present

## 2019-01-29 DIAGNOSIS — I5032 Chronic diastolic (congestive) heart failure: Secondary | ICD-10-CM | POA: Diagnosis not present

## 2019-01-29 DIAGNOSIS — Z20828 Contact with and (suspected) exposure to other viral communicable diseases: Secondary | ICD-10-CM | POA: Diagnosis not present

## 2019-01-29 DIAGNOSIS — I951 Orthostatic hypotension: Secondary | ICD-10-CM | POA: Diagnosis not present

## 2019-01-29 DIAGNOSIS — I639 Cerebral infarction, unspecified: Secondary | ICD-10-CM | POA: Diagnosis not present
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
## 2019-01-30 ENCOUNTER — Other Ambulatory Visit: Payer: Self-pay | Admitting: *Deleted

## 2019-01-30 DIAGNOSIS — R531 Weakness: Secondary | ICD-10-CM | POA: Diagnosis not present

## 2019-01-30 DIAGNOSIS — Z7902 Long term (current) use of antithrombotics/antiplatelets: Secondary | ICD-10-CM | POA: Diagnosis not present

## 2019-01-30 DIAGNOSIS — R29700 NIHSS score 0: Secondary | ICD-10-CM | POA: Diagnosis present

## 2019-01-30 DIAGNOSIS — Z794 Long term (current) use of insulin: Secondary | ICD-10-CM | POA: Diagnosis not present

## 2019-01-30 DIAGNOSIS — K5900 Constipation, unspecified: Secondary | ICD-10-CM | POA: Diagnosis present

## 2019-01-30 DIAGNOSIS — Z8673 Personal history of transient ischemic attack (TIA), and cerebral infarction without residual deficits: Secondary | ICD-10-CM | POA: Diagnosis not present

## 2019-01-30 DIAGNOSIS — M1611 Unilateral primary osteoarthritis, right hip: Secondary | ICD-10-CM | POA: Diagnosis present

## 2019-01-30 DIAGNOSIS — R4182 Altered mental status, unspecified: Secondary | ICD-10-CM | POA: Diagnosis not present

## 2019-01-30 DIAGNOSIS — N3000 Acute cystitis without hematuria: Secondary | ICD-10-CM | POA: Diagnosis not present

## 2019-01-30 DIAGNOSIS — R8290 Unspecified abnormal findings in urine: Secondary | ICD-10-CM | POA: Diagnosis not present

## 2019-01-30 DIAGNOSIS — D649 Anemia, unspecified: Secondary | ICD-10-CM | POA: Diagnosis present

## 2019-01-30 DIAGNOSIS — I2510 Atherosclerotic heart disease of native coronary artery without angina pectoris: Secondary | ICD-10-CM | POA: Diagnosis not present

## 2019-01-30 DIAGNOSIS — I110 Hypertensive heart disease with heart failure: Secondary | ICD-10-CM | POA: Diagnosis present

## 2019-01-30 DIAGNOSIS — Z79899 Other long term (current) drug therapy: Secondary | ICD-10-CM | POA: Diagnosis not present

## 2019-01-30 DIAGNOSIS — E1165 Type 2 diabetes mellitus with hyperglycemia: Secondary | ICD-10-CM | POA: Diagnosis present

## 2019-01-30 DIAGNOSIS — R55 Syncope and collapse: Secondary | ICD-10-CM | POA: Diagnosis not present

## 2019-01-30 DIAGNOSIS — G2581 Restless legs syndrome: Secondary | ICD-10-CM | POA: Diagnosis present

## 2019-01-30 DIAGNOSIS — I951 Orthostatic hypotension: Secondary | ICD-10-CM | POA: Diagnosis present

## 2019-01-30 DIAGNOSIS — N179 Acute kidney failure, unspecified: Secondary | ICD-10-CM | POA: Diagnosis present

## 2019-01-30 DIAGNOSIS — G629 Polyneuropathy, unspecified: Secondary | ICD-10-CM | POA: Diagnosis present

## 2019-01-30 DIAGNOSIS — I959 Hypotension, unspecified: Secondary | ICD-10-CM | POA: Diagnosis not present

## 2019-01-30 DIAGNOSIS — I5032 Chronic diastolic (congestive) heart failure: Secondary | ICD-10-CM | POA: Diagnosis present

## 2019-01-30 DIAGNOSIS — I69354 Hemiplegia and hemiparesis following cerebral infarction affecting left non-dominant side: Secondary | ICD-10-CM | POA: Diagnosis not present

## 2019-01-30 DIAGNOSIS — I6523 Occlusion and stenosis of bilateral carotid arteries: Secondary | ICD-10-CM | POA: Diagnosis not present

## 2019-01-30 DIAGNOSIS — R4701 Aphasia: Secondary | ICD-10-CM | POA: Diagnosis not present

## 2019-01-30 DIAGNOSIS — Z2821 Immunization not carried out because of patient refusal: Secondary | ICD-10-CM | POA: Diagnosis not present

## 2019-01-30 DIAGNOSIS — B9689 Other specified bacterial agents as the cause of diseases classified elsewhere: Secondary | ICD-10-CM | POA: Diagnosis not present

## 2019-01-30 DIAGNOSIS — I639 Cerebral infarction, unspecified: Secondary | ICD-10-CM | POA: Diagnosis not present

## 2019-01-30 DIAGNOSIS — E785 Hyperlipidemia, unspecified: Secondary | ICD-10-CM | POA: Diagnosis present

## 2019-01-30 NOTE — Patient Outreach (Signed)
[place] [facility]) Care Management  01/30/2019  JENIKA CHIEM 10-06-1945 [id number]  Care coordination    Patient with readmission to [facility] on 01/29/19, Dx: Stroke- like symptoms  vs orthostatic symptoms she  remains admitted on 9/24.    Plan Will follow disposition and anticipated plan for discharge to ALF. Will update team members following patient .    [name], RN, [place] Management Coordinator  [phone]- Mobile [phone]- Toll Free Main Office

## 2019-01-31 ENCOUNTER — Other Ambulatory Visit: Payer: Self-pay | Admitting: *Deleted

## 2019-01-31 DIAGNOSIS — R8290 Unspecified abnormal findings in urine: Secondary | ICD-10-CM | POA: Diagnosis not present

## 2019-01-31 DIAGNOSIS — Z8673 Personal history of transient ischemic attack (TIA), and cerebral infarction without residual deficits: Secondary | ICD-10-CM | POA: Diagnosis not present

## 2019-01-31 DIAGNOSIS — I5032 Chronic diastolic (congestive) heart failure: Secondary | ICD-10-CM | POA: Diagnosis not present

## 2019-01-31 DIAGNOSIS — I951 Orthostatic hypotension: Secondary | ICD-10-CM | POA: Diagnosis not present

## 2019-01-31 NOTE — Patient Outreach (Signed)
[place]) Care Management  01/31/2019  Haley GRASSEL Oct 13, 1945 [id number]   CSW received an in-basket message from patient's RNCM with [place] Management, [name], indicating that patient was readmitted into [facility] on Wednesday, January 29, 2019, due to experiencing stroke-like symptoms.  Patient continues to reside in the hospital, with the plan of being placed into a long-term care assisted living facility, once medically cleared and ready for discharge.  CSW will continue to follow patient while hospitalized, then resume social work/community case management services, once patient is discharged to an assisted living facility.  Nat Christen, BSW, MSW, LCSW  Licensed Education officer, [facility]  [address] [place] [address] [place], [place] [address] Physical Address[address], [place], [place] [address] Toll Free Main # [phone] Fax # [phone] Cell # [phone]  Office # [phone] [email or web address]

## 2019-02-03 ENCOUNTER — Other Ambulatory Visit: Payer: Self-pay | Admitting: *Deleted

## 2019-02-03 ENCOUNTER — Encounter: Payer: Self-pay | Admitting: *Deleted

## 2019-02-03 ENCOUNTER — Other Ambulatory Visit: Payer: Self-pay
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
## 2019-02-03 NOTE — Patient Outreach (Signed)
[facility] ([facility] Care Management  02/03/2019  [name] 03/18/1946 [id number]   CSW received an In-Basket Message from Amanda Cook, RNCM covering for Kimberly Glover, both with [facility] Care Management, indicating that patient was discharged from [facility] on Friday, January 31, 2019, and placed at [facility] for long-term care services.  CSW was able to converse with the Social Worker/Admissions Coordinator at the [facility] to ensure that patient plans to stay at their facility long-term.  CSW will perform a case closure on patient, as all goals of treatment have been met from social work standpoint and no additional social work needs have been identified at this time.  CSW will notify patient's RNCM with [facility], Kimberly Glover of CSW's plans to close patient's case.  CSW will fax an update to patient's Primary Care Physician, Dr. Douglas Schultz to ensure that they are aware of CSW's involvement with patient's plan of care.     , BSW, MSW, LCSW  Licensed Clinical Social Worker  [facility] Care Management [place] [facility]  Mailing Address-[address], [place], [place] [address] Physical Address-[address], [place], [place] [address] Toll Free Main # [phone] Fax # [phone] Cell # [phone]  Office # [phone] [email or web address]        

## 2019-02-05 ENCOUNTER — Ambulatory Visit: Payer: Medicare Other | Admitting: *Deleted

## 2019-02-07 ENCOUNTER — Ambulatory Visit: Payer: Medicare Other | Admitting: *Deleted

## 2019-02-07 DIAGNOSIS — J449 Chronic obstructive pulmonary disease, unspecified: Secondary | ICD-10-CM | POA: Diagnosis not present

## 2019-02-07 DIAGNOSIS — I5032 Chronic diastolic (congestive) heart failure: Secondary | ICD-10-CM | POA: Diagnosis not present

## 2019-02-07 DIAGNOSIS — I2510 Atherosclerotic heart disease of native coronary artery without angina pectoris: Secondary | ICD-10-CM | POA: Diagnosis not present

## 2019-02-07 DIAGNOSIS — M810 Age-related osteoporosis without current pathological fracture: Secondary | ICD-10-CM | POA: Diagnosis not present

## 2019-02-07 DIAGNOSIS — I69354 Hemiplegia and hemiparesis following cerebral infarction affecting left non-dominant side: Secondary | ICD-10-CM | POA: Diagnosis not present

## 2019-02-07 DIAGNOSIS — I6521 Occlusion and stenosis of right carotid artery: Secondary | ICD-10-CM | POA: Diagnosis not present

## 2019-02-10 DIAGNOSIS — Z8673 Personal history of transient ischemic attack (TIA), and cerebral infarction without residual deficits: Secondary | ICD-10-CM | POA: Diagnosis not present

## 2019-02-10 DIAGNOSIS — I5032 Chronic diastolic (congestive) heart failure: Secondary | ICD-10-CM | POA: Diagnosis not present

## 2019-02-10 DIAGNOSIS — I951 Orthostatic hypotension: Secondary | ICD-10-CM | POA: Diagnosis not present

## 2019-02-10 DIAGNOSIS — E1142 Type 2 diabetes mellitus with diabetic polyneuropathy: Secondary | ICD-10-CM | POA: Diagnosis not present

## 2019-02-12 DIAGNOSIS — I2510 Atherosclerotic heart disease of native coronary artery without angina pectoris: Secondary | ICD-10-CM | POA: Diagnosis not present

## 2019-02-12 DIAGNOSIS — J449 Chronic obstructive pulmonary disease, unspecified: Secondary | ICD-10-CM | POA: Diagnosis not present

## 2019-02-12 DIAGNOSIS — I6521 Occlusion and stenosis of right carotid artery: Secondary | ICD-10-CM | POA: Diagnosis not present

## 2019-02-12 DIAGNOSIS — M810 Age-related osteoporosis without current pathological fracture: Secondary | ICD-10-CM | POA: Diagnosis not present

## 2019-02-12 DIAGNOSIS — I5032 Chronic diastolic (congestive) heart failure: Secondary | ICD-10-CM | POA: Diagnosis not present

## 2019-02-12 DIAGNOSIS — I69354 Hemiplegia and hemiparesis following cerebral infarction affecting left non-dominant side: Secondary | ICD-10-CM | POA: Diagnosis not present

## 2019-02-13 DIAGNOSIS — M1611 Unilateral primary osteoarthritis, right hip: Secondary | ICD-10-CM | POA: Diagnosis not present

## 2019-02-17 DIAGNOSIS — J449 Chronic obstructive pulmonary disease, unspecified: Secondary | ICD-10-CM | POA: Diagnosis not present

## 2019-02-17 DIAGNOSIS — I6521 Occlusion and stenosis of right carotid artery: Secondary | ICD-10-CM | POA: Diagnosis not present

## 2019-02-17 DIAGNOSIS — I69354 Hemiplegia and hemiparesis following cerebral infarction affecting left non-dominant side: Secondary | ICD-10-CM | POA: Diagnosis not present

## 2019-02-17 DIAGNOSIS — I5032 Chronic diastolic (congestive) heart failure: Secondary | ICD-10-CM | POA: Diagnosis not present

## 2019-02-17 DIAGNOSIS — I2510 Atherosclerotic heart disease of native coronary artery without angina pectoris: Secondary | ICD-10-CM | POA: Diagnosis not present

## 2019-02-17 DIAGNOSIS — M810 Age-related osteoporosis without current pathological fracture: Secondary | ICD-10-CM | POA: Diagnosis not present

## 2019-02-18 ENCOUNTER — Ambulatory Visit: Payer: Medicare Other | Admitting: Cardiology

## 2019-02-19 ENCOUNTER — Other Ambulatory Visit: Payer: Self-pay

## 2019-02-19 NOTE — Patient Outreach (Signed)
First attempt to obtain mRs. No answer. Left message for return call.  

## 2019-02-20 DIAGNOSIS — I5032 Chronic diastolic (congestive) heart failure: Secondary | ICD-10-CM | POA: Diagnosis not present

## 2019-02-20 DIAGNOSIS — M810 Age-related osteoporosis without current pathological fracture: Secondary | ICD-10-CM | POA: Diagnosis not present

## 2019-02-20 DIAGNOSIS — I69354 Hemiplegia and hemiparesis following cerebral infarction affecting left non-dominant side: Secondary | ICD-10-CM | POA: Diagnosis not present

## 2019-02-20 DIAGNOSIS — I6521 Occlusion and stenosis of right carotid artery: Secondary | ICD-10-CM | POA: Diagnosis not present

## 2019-02-20 DIAGNOSIS — J449 Chronic obstructive pulmonary disease, unspecified: Secondary | ICD-10-CM | POA: Diagnosis not present

## 2019-02-20 DIAGNOSIS — I2510 Atherosclerotic heart disease of native coronary artery without angina pectoris: Secondary | ICD-10-CM | POA: Diagnosis not present

## 2019-02-21 ENCOUNTER — Other Ambulatory Visit: Payer: Self-pay

## 2019-02-21 ENCOUNTER — Ambulatory Visit (INDEPENDENT_AMBULATORY_CARE_PROVIDER_SITE_OTHER): Payer: Medicare Other | Admitting: Cardiology

## 2019-02-21 ENCOUNTER — Encounter: Payer: Self-pay | Admitting: *Deleted

## 2019-02-21 VITALS — BP 130/80 | HR 82 | Ht 63.0 in | Wt 194.0 lb

## 2019-02-21 DIAGNOSIS — M479 Spondylosis, unspecified: Secondary | ICD-10-CM | POA: Insufficient documentation

## 2019-02-21 DIAGNOSIS — M5136 Other intervertebral disc degeneration, lumbar region: Secondary | ICD-10-CM | POA: Insufficient documentation

## 2019-02-21 DIAGNOSIS — I10 Essential (primary) hypertension: Secondary | ICD-10-CM

## 2019-02-21 DIAGNOSIS — Z79899 Other long term (current) drug therapy: Secondary | ICD-10-CM | POA: Diagnosis not present

## 2019-02-21 DIAGNOSIS — I5032 Chronic diastolic (congestive) heart failure: Secondary | ICD-10-CM | POA: Diagnosis not present

## 2019-02-21 DIAGNOSIS — I639 Cerebral infarction, unspecified: Secondary | ICD-10-CM | POA: Diagnosis not present

## 2019-02-21 DIAGNOSIS — E559 Vitamin D deficiency, unspecified: Secondary | ICD-10-CM | POA: Diagnosis not present

## 2019-02-21 DIAGNOSIS — R52 Pain, unspecified: Secondary | ICD-10-CM | POA: Diagnosis not present

## 2019-02-21 DIAGNOSIS — E119 Type 2 diabetes mellitus without complications: Secondary | ICD-10-CM | POA: Insufficient documentation

## 2019-02-21 DIAGNOSIS — Z01818 Encounter for other preprocedural examination: Secondary | ICD-10-CM | POA: Diagnosis not present

## 2019-02-21 DIAGNOSIS — I700 Atherosclerosis of aorta: Secondary | ICD-10-CM | POA: Diagnosis not present

## 2019-02-21 DIAGNOSIS — Z01810 Encounter for preprocedural cardiovascular examination: Secondary | ICD-10-CM

## 2019-02-21 DIAGNOSIS — M810 Age-related osteoporosis without current pathological fracture: Secondary | ICD-10-CM | POA: Insufficient documentation

## 2019-02-21 DIAGNOSIS — I679 Cerebrovascular disease, unspecified: Secondary | ICD-10-CM | POA: Insufficient documentation

## 2019-02-21 DIAGNOSIS — M51369 Other intervertebral disc degeneration, lumbar region without mention of lumbar back pain or lower extremity pain: Secondary | ICD-10-CM | POA: Insufficient documentation

## 2019-02-21 DIAGNOSIS — I110 Hypertensive heart disease with heart failure: Secondary | ICD-10-CM

## 2019-02-21 DIAGNOSIS — M79609 Pain in unspecified limb: Secondary | ICD-10-CM | POA: Diagnosis not present

## 2019-02-21 DIAGNOSIS — I509 Heart failure, unspecified: Secondary | ICD-10-CM | POA: Insufficient documentation

## 2019-02-21 DIAGNOSIS — Z593 Problems related to living in residential institution: Secondary | ICD-10-CM | POA: Insufficient documentation
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
## 2019-02-21 NOTE — Patient Instructions (Signed)
Medication Instructions:  Your physician recommends that you continue on your current medications as directed. Please refer to the Current Medication list given to you today.  *If you need a refill on your cardiac medications before your next appointment, please call your pharmacy*  Lab Work: None  If you have labs (blood work) drawn today and your tests are completely normal, you will receive your results only by: . MyChart Message (if you have MyChart) OR . A paper copy in the mail If you have any lab test that is abnormal or we need to change your treatment, we will call you to review the results.  Testing/Procedures: You had an EKG today.   Follow-Up: At [facility], you and your health needs are our priority.  As part of our continuing mission to provide you with exceptional heart care, we have created designated Provider Care Teams.  These Care Teams include your primary Cardiologist (physician) and Advanced Practice Providers (APPs -  Physician Assistants and Nurse Practitioners) who all work together to provide you with the care you need, when you need it.  Your next appointment:   3 months  The format for your next appointment:   In Person  Provider:   Brian Munley, MD  

## 2019-02-22 DIAGNOSIS — M1611 Unilateral primary osteoarthritis, right hip: Secondary | ICD-10-CM | POA: Diagnosis not present

## 2019-02-22 DIAGNOSIS — G2581 Restless legs syndrome: Secondary | ICD-10-CM | POA: Diagnosis not present

## 2019-02-22 DIAGNOSIS — I2510 Atherosclerotic heart disease of native coronary artery without angina pectoris: Secondary | ICD-10-CM | POA: Diagnosis not present

## 2019-02-22 DIAGNOSIS — R262 Difficulty in walking, not elsewhere classified: Secondary | ICD-10-CM | POA: Diagnosis not present

## 2019-02-22 DIAGNOSIS — M810 Age-related osteoporosis without current pathological fracture: Secondary | ICD-10-CM | POA: Diagnosis not present

## 2019-02-22 DIAGNOSIS — I739 Peripheral vascular disease, unspecified: Secondary | ICD-10-CM | POA: Diagnosis not present

## 2019-02-22 DIAGNOSIS — N189 Chronic kidney disease, unspecified: Secondary | ICD-10-CM | POA: Diagnosis not present

## 2019-02-22 DIAGNOSIS — R2689 Other abnormalities of gait and mobility: Secondary | ICD-10-CM | POA: Diagnosis not present

## 2019-02-22 DIAGNOSIS — E1140 Type 2 diabetes mellitus with diabetic neuropathy, unspecified: Secondary | ICD-10-CM | POA: Diagnosis not present

## 2019-02-22 DIAGNOSIS — I6521 Occlusion and stenosis of right carotid artery: Secondary | ICD-10-CM | POA: Diagnosis not present

## 2019-02-22 DIAGNOSIS — M25562 Pain in left knee: Secondary | ICD-10-CM | POA: Diagnosis not present

## 2019-02-22 DIAGNOSIS — Z7982 Long term (current) use of aspirin: Secondary | ICD-10-CM | POA: Diagnosis not present

## 2019-02-22 DIAGNOSIS — Z7902 Long term (current) use of antithrombotics/antiplatelets: Secondary | ICD-10-CM | POA: Diagnosis not present

## 2019-02-22 DIAGNOSIS — D631 Anemia in chronic kidney disease: Secondary | ICD-10-CM | POA: Diagnosis not present

## 2019-02-22 DIAGNOSIS — I130 Hypertensive heart and chronic kidney disease with heart failure and stage 1 through stage 4 chronic kidney disease, or unspecified chronic kidney disease: Secondary | ICD-10-CM | POA: Diagnosis not present

## 2019-02-22 DIAGNOSIS — E1122 Type 2 diabetes mellitus with diabetic chronic kidney disease: Secondary | ICD-10-CM | POA: Diagnosis not present

## 2019-02-22 DIAGNOSIS — I5032 Chronic diastolic (congestive) heart failure: Secondary | ICD-10-CM | POA: Diagnosis not present

## 2019-02-22 DIAGNOSIS — Z741 Need for assistance with personal care: Secondary | ICD-10-CM | POA: Diagnosis not present

## 2019-02-22 DIAGNOSIS — E785 Hyperlipidemia, unspecified: Secondary | ICD-10-CM | POA: Diagnosis not present

## 2019-02-22 DIAGNOSIS — I69354 Hemiplegia and hemiparesis following cerebral infarction affecting left non-dominant side: Secondary | ICD-10-CM | POA: Diagnosis not present

## 2019-02-22 DIAGNOSIS — Z87440 Personal history of urinary (tract) infections: Secondary | ICD-10-CM | POA: Diagnosis not present

## 2019-02-22 DIAGNOSIS — Z7984 Long term (current) use of oral hypoglycemic drugs: Secondary | ICD-10-CM | POA: Diagnosis not present

## 2019-02-22 DIAGNOSIS — J449 Chronic obstructive pulmonary disease, unspecified: Secondary | ICD-10-CM | POA: Diagnosis not present

## 2019-02-22 DIAGNOSIS — Z8673 Personal history of transient ischemic attack (TIA), and cerebral infarction without residual deficits: Secondary | ICD-10-CM | POA: Diagnosis not present

## 2019-02-22 DIAGNOSIS — Z794 Long term (current) use of insulin: Secondary | ICD-10-CM | POA: Diagnosis not present

## 2019-02-24 DIAGNOSIS — I69354 Hemiplegia and hemiparesis following cerebral infarction affecting left non-dominant side: Secondary | ICD-10-CM | POA: Diagnosis not present

## 2019-02-24 DIAGNOSIS — I5032 Chronic diastolic (congestive) heart failure: Secondary | ICD-10-CM | POA: Diagnosis not present

## 2019-02-24 DIAGNOSIS — E1140 Type 2 diabetes mellitus with diabetic neuropathy, unspecified: Secondary | ICD-10-CM | POA: Diagnosis not present

## 2019-02-24 DIAGNOSIS — J449 Chronic obstructive pulmonary disease, unspecified: Secondary | ICD-10-CM | POA: Diagnosis not present

## 2019-02-24 DIAGNOSIS — I6521 Occlusion and stenosis of right carotid artery: Secondary | ICD-10-CM | POA: Diagnosis not present

## 2019-02-24 DIAGNOSIS — I130 Hypertensive heart and chronic kidney disease with heart failure and stage 1 through stage 4 chronic kidney disease, or unspecified chronic kidney disease: Secondary | ICD-10-CM | POA: Diagnosis not present

## 2019-02-26 ENCOUNTER — Other Ambulatory Visit: Payer: Self-pay

## 2019-02-26 DIAGNOSIS — I130 Hypertensive heart and chronic kidney disease with heart failure and stage 1 through stage 4 chronic kidney disease, or unspecified chronic kidney disease: Secondary | ICD-10-CM | POA: Diagnosis not present

## 2019-02-26 DIAGNOSIS — J449 Chronic obstructive pulmonary disease, unspecified: Secondary | ICD-10-CM | POA: Diagnosis not present

## 2019-02-26 DIAGNOSIS — I6521 Occlusion and stenosis of right carotid artery: Secondary | ICD-10-CM | POA: Diagnosis not present

## 2019-02-26 DIAGNOSIS — I69354 Hemiplegia and hemiparesis following cerebral infarction affecting left non-dominant side: Secondary | ICD-10-CM | POA: Diagnosis not present

## 2019-02-26 DIAGNOSIS — E1140 Type 2 diabetes mellitus with diabetic neuropathy, unspecified: Secondary | ICD-10-CM | POA: Diagnosis not present

## 2019-02-26 DIAGNOSIS — I5032 Chronic diastolic (congestive) heart failure: Secondary | ICD-10-CM | POA: Diagnosis not present

## 2019-02-26 NOTE — Patient Outreach (Signed)
Second attempt to obtain mRs. No answer. Left message for return call.  

## 2019-03-03 ENCOUNTER — Other Ambulatory Visit: Payer: Self-pay

## 2019-03-03 DIAGNOSIS — I130 Hypertensive heart and chronic kidney disease with heart failure and stage 1 through stage 4 chronic kidney disease, or unspecified chronic kidney disease: Secondary | ICD-10-CM | POA: Diagnosis not present

## 2019-03-03 DIAGNOSIS — I6521 Occlusion and stenosis of right carotid artery: Secondary | ICD-10-CM | POA: Diagnosis not present

## 2019-03-03 DIAGNOSIS — J449 Chronic obstructive pulmonary disease, unspecified: Secondary | ICD-10-CM | POA: Diagnosis not present

## 2019-03-03 DIAGNOSIS — I5032 Chronic diastolic (congestive) heart failure: Secondary | ICD-10-CM | POA: Diagnosis not present

## 2019-03-03 DIAGNOSIS — I69354 Hemiplegia and hemiparesis following cerebral infarction affecting left non-dominant side: Secondary | ICD-10-CM | POA: Diagnosis not present

## 2019-03-03 DIAGNOSIS — E1140 Type 2 diabetes mellitus with diabetic neuropathy, unspecified: Secondary | ICD-10-CM | POA: Diagnosis not present

## 2019-03-03 NOTE — Patient Outreach (Signed)
3 outreach attempts were completed to obtain mRs. mRs could not be obtained because patient never returned my calls. mRs=7 

## 2019-03-07 DIAGNOSIS — I69354 Hemiplegia and hemiparesis following cerebral infarction affecting left non-dominant side: Secondary | ICD-10-CM | POA: Diagnosis not present

## 2019-03-07 DIAGNOSIS — I130 Hypertensive heart and chronic kidney disease with heart failure and stage 1 through stage 4 chronic kidney disease, or unspecified chronic kidney disease: Secondary | ICD-10-CM | POA: Diagnosis not present

## 2019-03-07 DIAGNOSIS — J449 Chronic obstructive pulmonary disease, unspecified: Secondary | ICD-10-CM | POA: Diagnosis not present

## 2019-03-07 DIAGNOSIS — I5032 Chronic diastolic (congestive) heart failure: Secondary | ICD-10-CM | POA: Diagnosis not present

## 2019-03-07 DIAGNOSIS — E1140 Type 2 diabetes mellitus with diabetic neuropathy, unspecified: Secondary | ICD-10-CM | POA: Diagnosis not present

## 2019-03-07 DIAGNOSIS — I6521 Occlusion and stenosis of right carotid artery: Secondary | ICD-10-CM | POA: Diagnosis not present

## 2019-03-11 DIAGNOSIS — I5032 Chronic diastolic (congestive) heart failure: Secondary | ICD-10-CM | POA: Diagnosis not present

## 2019-03-11 DIAGNOSIS — J449 Chronic obstructive pulmonary disease, unspecified: Secondary | ICD-10-CM | POA: Diagnosis not present

## 2019-03-11 DIAGNOSIS — I6521 Occlusion and stenosis of right carotid artery: Secondary | ICD-10-CM | POA: Diagnosis not present

## 2019-03-11 DIAGNOSIS — E1140 Type 2 diabetes mellitus with diabetic neuropathy, unspecified: Secondary | ICD-10-CM | POA: Diagnosis not present

## 2019-03-11 DIAGNOSIS — I130 Hypertensive heart and chronic kidney disease with heart failure and stage 1 through stage 4 chronic kidney disease, or unspecified chronic kidney disease: Secondary | ICD-10-CM | POA: Diagnosis not present

## 2019-03-11 DIAGNOSIS — I69354 Hemiplegia and hemiparesis following cerebral infarction affecting left non-dominant side: Secondary | ICD-10-CM | POA: Diagnosis not present

## 2019-03-18 DIAGNOSIS — E1142 Type 2 diabetes mellitus with diabetic polyneuropathy: Secondary | ICD-10-CM | POA: Diagnosis not present

## 2019-03-18 DIAGNOSIS — E1165 Type 2 diabetes mellitus with hyperglycemia: Secondary | ICD-10-CM | POA: Diagnosis not present

## 2019-03-18 DIAGNOSIS — I5032 Chronic diastolic (congestive) heart failure: Secondary | ICD-10-CM | POA: Diagnosis not present

## 2019-03-18 DIAGNOSIS — I6990 Unspecified sequelae of unspecified cerebrovascular disease: Secondary | ICD-10-CM | POA: Diagnosis not present

## 2019-03-18 DIAGNOSIS — D519 Vitamin B12 deficiency anemia, unspecified: Secondary | ICD-10-CM | POA: Diagnosis not present

## 2019-03-18 DIAGNOSIS — E785 Hyperlipidemia, unspecified: Secondary | ICD-10-CM | POA: Diagnosis not present

## 2019-03-19 DIAGNOSIS — I69354 Hemiplegia and hemiparesis following cerebral infarction affecting left non-dominant side: Secondary | ICD-10-CM | POA: Diagnosis not present

## 2019-03-19 DIAGNOSIS — I5032 Chronic diastolic (congestive) heart failure: Secondary | ICD-10-CM | POA: Diagnosis not present

## 2019-03-19 DIAGNOSIS — E1140 Type 2 diabetes mellitus with diabetic neuropathy, unspecified: Secondary | ICD-10-CM | POA: Diagnosis not present

## 2019-03-19 DIAGNOSIS — I130 Hypertensive heart and chronic kidney disease with heart failure and stage 1 through stage 4 chronic kidney disease, or unspecified chronic kidney disease: Secondary | ICD-10-CM | POA: Diagnosis not present

## 2019-03-19 DIAGNOSIS — J449 Chronic obstructive pulmonary disease, unspecified: Secondary | ICD-10-CM | POA: Diagnosis not present

## 2019-03-19 DIAGNOSIS — I6521 Occlusion and stenosis of right carotid artery: Secondary | ICD-10-CM | POA: Diagnosis not present

## 2019-03-23 DIAGNOSIS — R1030 Lower abdominal pain, unspecified: Secondary | ICD-10-CM | POA: Diagnosis not present

## 2019-03-23 DIAGNOSIS — R102 Pelvic and perineal pain: Secondary | ICD-10-CM | POA: Diagnosis not present

## 2019-03-23 DIAGNOSIS — E119 Type 2 diabetes mellitus without complications: Secondary | ICD-10-CM | POA: Diagnosis not present

## 2019-03-23 DIAGNOSIS — R1032 Left lower quadrant pain: Secondary | ICD-10-CM | POA: Diagnosis not present

## 2019-03-24 DIAGNOSIS — Z8673 Personal history of transient ischemic attack (TIA), and cerebral infarction without residual deficits: Secondary | ICD-10-CM | POA: Diagnosis not present

## 2019-03-24 DIAGNOSIS — D631 Anemia in chronic kidney disease: Secondary | ICD-10-CM | POA: Diagnosis not present

## 2019-03-24 DIAGNOSIS — J449 Chronic obstructive pulmonary disease, unspecified: Secondary | ICD-10-CM | POA: Diagnosis not present

## 2019-03-24 DIAGNOSIS — R2689 Other abnormalities of gait and mobility: Secondary | ICD-10-CM | POA: Diagnosis not present

## 2019-03-24 DIAGNOSIS — Z7982 Long term (current) use of aspirin: Secondary | ICD-10-CM | POA: Diagnosis not present

## 2019-03-24 DIAGNOSIS — M1611 Unilateral primary osteoarthritis, right hip: Secondary | ICD-10-CM | POA: Diagnosis not present

## 2019-03-24 DIAGNOSIS — G2581 Restless legs syndrome: Secondary | ICD-10-CM | POA: Diagnosis not present

## 2019-03-24 DIAGNOSIS — M810 Age-related osteoporosis without current pathological fracture: Secondary | ICD-10-CM | POA: Diagnosis not present

## 2019-03-24 DIAGNOSIS — I5032 Chronic diastolic (congestive) heart failure: Secondary | ICD-10-CM | POA: Diagnosis not present

## 2019-03-24 DIAGNOSIS — E785 Hyperlipidemia, unspecified: Secondary | ICD-10-CM | POA: Diagnosis not present

## 2019-03-24 DIAGNOSIS — Z7984 Long term (current) use of oral hypoglycemic drugs: Secondary | ICD-10-CM | POA: Diagnosis not present

## 2019-03-24 DIAGNOSIS — Z7902 Long term (current) use of antithrombotics/antiplatelets: Secondary | ICD-10-CM | POA: Diagnosis not present

## 2019-03-24 DIAGNOSIS — E1122 Type 2 diabetes mellitus with diabetic chronic kidney disease: Secondary | ICD-10-CM | POA: Diagnosis not present

## 2019-03-24 DIAGNOSIS — Z87440 Personal history of urinary (tract) infections: Secondary | ICD-10-CM | POA: Diagnosis not present

## 2019-03-24 DIAGNOSIS — I130 Hypertensive heart and chronic kidney disease with heart failure and stage 1 through stage 4 chronic kidney disease, or unspecified chronic kidney disease: Secondary | ICD-10-CM | POA: Diagnosis not present

## 2019-03-24 DIAGNOSIS — Z794 Long term (current) use of insulin: Secondary | ICD-10-CM | POA: Diagnosis not present

## 2019-03-24 DIAGNOSIS — I6521 Occlusion and stenosis of right carotid artery: Secondary | ICD-10-CM | POA: Diagnosis not present

## 2019-03-24 DIAGNOSIS — I2510 Atherosclerotic heart disease of native coronary artery without angina pectoris: Secondary | ICD-10-CM | POA: Diagnosis not present

## 2019-03-24 DIAGNOSIS — E1140 Type 2 diabetes mellitus with diabetic neuropathy, unspecified: Secondary | ICD-10-CM | POA: Diagnosis not present

## 2019-03-24 DIAGNOSIS — I739 Peripheral vascular disease, unspecified: Secondary | ICD-10-CM | POA: Diagnosis not present

## 2019-03-24 DIAGNOSIS — M25562 Pain in left knee: Secondary | ICD-10-CM | POA: Diagnosis not present

## 2019-03-24 DIAGNOSIS — Z741 Need for assistance with personal care: Secondary | ICD-10-CM | POA: Diagnosis not present

## 2019-03-24 DIAGNOSIS — R262 Difficulty in walking, not elsewhere classified: Secondary | ICD-10-CM | POA: Diagnosis not present

## 2019-03-24 DIAGNOSIS — I69354 Hemiplegia and hemiparesis following cerebral infarction affecting left non-dominant side: Secondary | ICD-10-CM | POA: Diagnosis not present

## 2019-03-24 DIAGNOSIS — N189 Chronic kidney disease, unspecified: Secondary | ICD-10-CM | POA: Diagnosis not present

## 2019-04-15 DIAGNOSIS — E1142 Type 2 diabetes mellitus with diabetic polyneuropathy: Secondary | ICD-10-CM | POA: Diagnosis not present

## 2019-04-23 DIAGNOSIS — G2581 Restless legs syndrome: Secondary | ICD-10-CM | POA: Diagnosis not present

## 2019-04-23 DIAGNOSIS — E1122 Type 2 diabetes mellitus with diabetic chronic kidney disease: Secondary | ICD-10-CM | POA: Diagnosis not present

## 2019-04-23 DIAGNOSIS — Z7984 Long term (current) use of oral hypoglycemic drugs: Secondary | ICD-10-CM | POA: Diagnosis not present

## 2019-04-23 DIAGNOSIS — E1140 Type 2 diabetes mellitus with diabetic neuropathy, unspecified: Secondary | ICD-10-CM | POA: Diagnosis not present

## 2019-04-23 DIAGNOSIS — Z7902 Long term (current) use of antithrombotics/antiplatelets: Secondary | ICD-10-CM | POA: Diagnosis not present

## 2019-04-23 DIAGNOSIS — Z8673 Personal history of transient ischemic attack (TIA), and cerebral infarction without residual deficits: Secondary | ICD-10-CM | POA: Diagnosis not present

## 2019-04-23 DIAGNOSIS — Z7982 Long term (current) use of aspirin: Secondary | ICD-10-CM | POA: Diagnosis not present

## 2019-04-23 DIAGNOSIS — I5032 Chronic diastolic (congestive) heart failure: Secondary | ICD-10-CM | POA: Diagnosis not present

## 2019-04-23 DIAGNOSIS — I69354 Hemiplegia and hemiparesis following cerebral infarction affecting left non-dominant side: Secondary | ICD-10-CM | POA: Diagnosis not present

## 2019-04-23 DIAGNOSIS — I2510 Atherosclerotic heart disease of native coronary artery without angina pectoris: Secondary | ICD-10-CM | POA: Diagnosis not present

## 2019-04-23 DIAGNOSIS — M1611 Unilateral primary osteoarthritis, right hip: Secondary | ICD-10-CM | POA: Diagnosis not present

## 2019-04-23 DIAGNOSIS — E785 Hyperlipidemia, unspecified: Secondary | ICD-10-CM | POA: Diagnosis not present

## 2019-04-23 DIAGNOSIS — Z794 Long term (current) use of insulin: Secondary | ICD-10-CM | POA: Diagnosis not present

## 2019-04-23 DIAGNOSIS — I739 Peripheral vascular disease, unspecified: Secondary | ICD-10-CM | POA: Diagnosis not present

## 2019-04-23 DIAGNOSIS — J449 Chronic obstructive pulmonary disease, unspecified: Secondary | ICD-10-CM | POA: Diagnosis not present

## 2019-04-23 DIAGNOSIS — M810 Age-related osteoporosis without current pathological fracture: Secondary | ICD-10-CM | POA: Diagnosis not present

## 2019-04-23 DIAGNOSIS — N189 Chronic kidney disease, unspecified: Secondary | ICD-10-CM | POA: Diagnosis not present

## 2019-04-23 DIAGNOSIS — R2689 Other abnormalities of gait and mobility: Secondary | ICD-10-CM | POA: Diagnosis not present

## 2019-04-23 DIAGNOSIS — I6521 Occlusion and stenosis of right carotid artery: Secondary | ICD-10-CM | POA: Diagnosis not present

## 2019-04-23 DIAGNOSIS — M25562 Pain in left knee: Secondary | ICD-10-CM | POA: Diagnosis not present

## 2019-04-23 DIAGNOSIS — I130 Hypertensive heart and chronic kidney disease with heart failure and stage 1 through stage 4 chronic kidney disease, or unspecified chronic kidney disease: Secondary | ICD-10-CM | POA: Diagnosis not present

## 2019-04-23 DIAGNOSIS — Z741 Need for assistance with personal care: Secondary | ICD-10-CM | POA: Diagnosis not present

## 2019-04-23 DIAGNOSIS — D631 Anemia in chronic kidney disease: Secondary | ICD-10-CM | POA: Diagnosis not present

## 2019-04-23 DIAGNOSIS — R262 Difficulty in walking, not elsewhere classified: Secondary | ICD-10-CM | POA: Diagnosis not present

## 2019-04-23 DIAGNOSIS — Z87440 Personal history of urinary (tract) infections: Secondary | ICD-10-CM | POA: Diagnosis not present

## 2019-05-05 DIAGNOSIS — I6521 Occlusion and stenosis of right carotid artery: Secondary | ICD-10-CM | POA: Diagnosis not present

## 2019-05-05 DIAGNOSIS — I5032 Chronic diastolic (congestive) heart failure: Secondary | ICD-10-CM | POA: Diagnosis not present

## 2019-05-05 DIAGNOSIS — E1140 Type 2 diabetes mellitus with diabetic neuropathy, unspecified: Secondary | ICD-10-CM | POA: Diagnosis not present

## 2019-05-05 DIAGNOSIS — I130 Hypertensive heart and chronic kidney disease with heart failure and stage 1 through stage 4 chronic kidney disease, or unspecified chronic kidney disease: Secondary | ICD-10-CM | POA: Diagnosis not present

## 2019-05-05 DIAGNOSIS — I69354 Hemiplegia and hemiparesis following cerebral infarction affecting left non-dominant side: Secondary | ICD-10-CM | POA: Diagnosis not present

## 2019-05-05 DIAGNOSIS — J449 Chronic obstructive pulmonary disease, unspecified: Secondary | ICD-10-CM | POA: Diagnosis not present

## 2019-05-08 DIAGNOSIS — J449 Chronic obstructive pulmonary disease, unspecified: Secondary | ICD-10-CM | POA: Diagnosis not present

## 2019-05-08 DIAGNOSIS — I5032 Chronic diastolic (congestive) heart failure: Secondary | ICD-10-CM | POA: Diagnosis not present

## 2019-05-08 DIAGNOSIS — I130 Hypertensive heart and chronic kidney disease with heart failure and stage 1 through stage 4 chronic kidney disease, or unspecified chronic kidney disease: Secondary | ICD-10-CM | POA: Diagnosis not present

## 2019-05-08 DIAGNOSIS — E1140 Type 2 diabetes mellitus with diabetic neuropathy, unspecified: Secondary | ICD-10-CM | POA: Diagnosis not present

## 2019-05-08 DIAGNOSIS — I6521 Occlusion and stenosis of right carotid artery: Secondary | ICD-10-CM | POA: Diagnosis not present

## 2019-05-08 DIAGNOSIS — I69354 Hemiplegia and hemiparesis following cerebral infarction affecting left non-dominant side: Secondary | ICD-10-CM | POA: Diagnosis not present

## 2019-05-23 DIAGNOSIS — Z7984 Long term (current) use of oral hypoglycemic drugs: Secondary | ICD-10-CM | POA: Diagnosis not present

## 2019-05-23 DIAGNOSIS — I6521 Occlusion and stenosis of right carotid artery: Secondary | ICD-10-CM | POA: Diagnosis not present

## 2019-05-23 DIAGNOSIS — I5032 Chronic diastolic (congestive) heart failure: Secondary | ICD-10-CM | POA: Diagnosis not present

## 2019-05-23 DIAGNOSIS — D631 Anemia in chronic kidney disease: Secondary | ICD-10-CM | POA: Diagnosis not present

## 2019-05-23 DIAGNOSIS — E785 Hyperlipidemia, unspecified: Secondary | ICD-10-CM | POA: Diagnosis not present

## 2019-05-23 DIAGNOSIS — I130 Hypertensive heart and chronic kidney disease with heart failure and stage 1 through stage 4 chronic kidney disease, or unspecified chronic kidney disease: Secondary | ICD-10-CM | POA: Diagnosis not present

## 2019-05-23 DIAGNOSIS — Z7982 Long term (current) use of aspirin: Secondary | ICD-10-CM | POA: Diagnosis not present

## 2019-05-23 DIAGNOSIS — I2510 Atherosclerotic heart disease of native coronary artery without angina pectoris: Secondary | ICD-10-CM | POA: Diagnosis not present

## 2019-05-23 DIAGNOSIS — E1122 Type 2 diabetes mellitus with diabetic chronic kidney disease: Secondary | ICD-10-CM | POA: Diagnosis not present

## 2019-05-23 DIAGNOSIS — Z87440 Personal history of urinary (tract) infections: Secondary | ICD-10-CM | POA: Diagnosis not present

## 2019-05-23 DIAGNOSIS — M1611 Unilateral primary osteoarthritis, right hip: Secondary | ICD-10-CM | POA: Diagnosis not present

## 2019-05-23 DIAGNOSIS — M25562 Pain in left knee: Secondary | ICD-10-CM | POA: Diagnosis not present

## 2019-05-23 DIAGNOSIS — E1140 Type 2 diabetes mellitus with diabetic neuropathy, unspecified: Secondary | ICD-10-CM | POA: Diagnosis not present

## 2019-05-23 DIAGNOSIS — J449 Chronic obstructive pulmonary disease, unspecified: Secondary | ICD-10-CM | POA: Diagnosis not present

## 2019-05-23 DIAGNOSIS — R2689 Other abnormalities of gait and mobility: Secondary | ICD-10-CM | POA: Diagnosis not present

## 2019-05-23 DIAGNOSIS — I69354 Hemiplegia and hemiparesis following cerebral infarction affecting left non-dominant side: Secondary | ICD-10-CM | POA: Diagnosis not present

## 2019-05-23 DIAGNOSIS — Z7902 Long term (current) use of antithrombotics/antiplatelets: Secondary | ICD-10-CM | POA: Diagnosis not present

## 2019-05-23 DIAGNOSIS — Z8673 Personal history of transient ischemic attack (TIA), and cerebral infarction without residual deficits: Secondary | ICD-10-CM | POA: Diagnosis not present

## 2019-05-23 DIAGNOSIS — G2581 Restless legs syndrome: Secondary | ICD-10-CM | POA: Diagnosis not present

## 2019-05-23 DIAGNOSIS — Z794 Long term (current) use of insulin: Secondary | ICD-10-CM | POA: Diagnosis not present

## 2019-05-23 DIAGNOSIS — R262 Difficulty in walking, not elsewhere classified: Secondary | ICD-10-CM | POA: Diagnosis not present

## 2019-05-23 DIAGNOSIS — I739 Peripheral vascular disease, unspecified: Secondary | ICD-10-CM | POA: Diagnosis not present

## 2019-05-23 DIAGNOSIS — M810 Age-related osteoporosis without current pathological fracture: Secondary | ICD-10-CM | POA: Diagnosis not present

## 2019-05-23 DIAGNOSIS — Z741 Need for assistance with personal care: Secondary | ICD-10-CM | POA: Diagnosis not present

## 2019-05-23 DIAGNOSIS — N189 Chronic kidney disease, unspecified: Secondary | ICD-10-CM | POA: Diagnosis not present

## 2019-06-06 DIAGNOSIS — B9689 Other specified bacterial agents as the cause of diseases classified elsewhere: Secondary | ICD-10-CM | POA: Diagnosis not present

## 2019-06-06 DIAGNOSIS — Z794 Long term (current) use of insulin: Secondary | ICD-10-CM | POA: Diagnosis not present

## 2019-06-06 DIAGNOSIS — M1611 Unilateral primary osteoarthritis, right hip: Secondary | ICD-10-CM | POA: Diagnosis not present

## 2019-06-06 DIAGNOSIS — R52 Pain, unspecified: Secondary | ICD-10-CM | POA: Diagnosis not present

## 2019-06-06 DIAGNOSIS — G8929 Other chronic pain: Secondary | ICD-10-CM | POA: Diagnosis not present

## 2019-06-06 DIAGNOSIS — M79603 Pain in arm, unspecified: Secondary | ICD-10-CM | POA: Diagnosis not present

## 2019-06-06 DIAGNOSIS — N3000 Acute cystitis without hematuria: Secondary | ICD-10-CM | POA: Diagnosis not present

## 2019-06-06 DIAGNOSIS — Z7902 Long term (current) use of antithrombotics/antiplatelets: Secondary | ICD-10-CM | POA: Diagnosis not present

## 2019-06-06 DIAGNOSIS — Z79899 Other long term (current) drug therapy: Secondary | ICD-10-CM | POA: Diagnosis not present

## 2019-06-13 DIAGNOSIS — I5032 Chronic diastolic (congestive) heart failure: Secondary | ICD-10-CM | POA: Diagnosis not present

## 2019-06-13 DIAGNOSIS — J449 Chronic obstructive pulmonary disease, unspecified: Secondary | ICD-10-CM | POA: Diagnosis not present

## 2019-06-13 DIAGNOSIS — I69354 Hemiplegia and hemiparesis following cerebral infarction affecting left non-dominant side: Secondary | ICD-10-CM | POA: Diagnosis not present

## 2019-06-13 DIAGNOSIS — I130 Hypertensive heart and chronic kidney disease with heart failure and stage 1 through stage 4 chronic kidney disease, or unspecified chronic kidney disease: Secondary | ICD-10-CM | POA: Diagnosis not present

## 2019-06-13 DIAGNOSIS — E1140 Type 2 diabetes mellitus with diabetic neuropathy, unspecified: Secondary | ICD-10-CM | POA: Diagnosis not present

## 2019-06-13 DIAGNOSIS — I6521 Occlusion and stenosis of right carotid artery: Secondary | ICD-10-CM | POA: Diagnosis not present

## 2019-06-17 DIAGNOSIS — D519 Vitamin B12 deficiency anemia, unspecified: Secondary | ICD-10-CM | POA: Diagnosis not present

## 2019-06-17 DIAGNOSIS — N390 Urinary tract infection, site not specified: Secondary | ICD-10-CM | POA: Diagnosis not present

## 2019-06-17 DIAGNOSIS — E1165 Type 2 diabetes mellitus with hyperglycemia: Secondary | ICD-10-CM | POA: Diagnosis not present

## 2019-06-17 DIAGNOSIS — E785 Hyperlipidemia, unspecified: Secondary | ICD-10-CM | POA: Diagnosis not present

## 2019-06-17 DIAGNOSIS — I6990 Unspecified sequelae of unspecified cerebrovascular disease: Secondary | ICD-10-CM | POA: Diagnosis not present

## 2019-06-17 DIAGNOSIS — E1142 Type 2 diabetes mellitus with diabetic polyneuropathy: Secondary | ICD-10-CM | POA: Diagnosis not present

## 2019-06-19 DIAGNOSIS — I69354 Hemiplegia and hemiparesis following cerebral infarction affecting left non-dominant side: Secondary | ICD-10-CM | POA: Diagnosis not present

## 2019-06-19 DIAGNOSIS — E1140 Type 2 diabetes mellitus with diabetic neuropathy, unspecified: Secondary | ICD-10-CM | POA: Diagnosis not present

## 2019-06-19 DIAGNOSIS — J449 Chronic obstructive pulmonary disease, unspecified: Secondary | ICD-10-CM | POA: Diagnosis not present

## 2019-06-19 DIAGNOSIS — I130 Hypertensive heart and chronic kidney disease with heart failure and stage 1 through stage 4 chronic kidney disease, or unspecified chronic kidney disease: Secondary | ICD-10-CM | POA: Diagnosis not present

## 2019-06-19 DIAGNOSIS — I5032 Chronic diastolic (congestive) heart failure: Secondary | ICD-10-CM | POA: Diagnosis not present

## 2019-06-19 DIAGNOSIS — I6521 Occlusion and stenosis of right carotid artery: Secondary | ICD-10-CM | POA: Diagnosis not present

## 2019-06-23 DIAGNOSIS — R1030 Lower abdominal pain, unspecified: Secondary | ICD-10-CM | POA: Diagnosis not present

## 2019-06-23 DIAGNOSIS — R52 Pain, unspecified: Secondary | ICD-10-CM | POA: Diagnosis not present

## 2019-06-23 DIAGNOSIS — I10 Essential (primary) hypertension: Secondary | ICD-10-CM | POA: Diagnosis not present

## 2019-06-23 DIAGNOSIS — K5900 Constipation, unspecified: Secondary | ICD-10-CM | POA: Diagnosis not present

## 2019-07-10 DIAGNOSIS — Z1159 Encounter for screening for other viral diseases: Secondary | ICD-10-CM | POA: Diagnosis not present

## 2019-07-11 DIAGNOSIS — Z01818 Encounter for other preprocedural examination: Secondary | ICD-10-CM | POA: Diagnosis not present

## 2019-07-11 DIAGNOSIS — E559 Vitamin D deficiency, unspecified: Secondary | ICD-10-CM | POA: Diagnosis not present

## 2019-07-11 DIAGNOSIS — M79609 Pain in unspecified limb: Secondary | ICD-10-CM | POA: Diagnosis not present

## 2019-07-11 DIAGNOSIS — Z79899 Other long term (current) drug therapy: Secondary | ICD-10-CM | POA: Diagnosis not present

## 2019-07-11 DIAGNOSIS — R52 Pain, unspecified: Secondary | ICD-10-CM | POA: Diagnosis not present

## 2019-07-16 DIAGNOSIS — E119 Type 2 diabetes mellitus without complications: Secondary | ICD-10-CM | POA: Diagnosis not present

## 2019-07-16 DIAGNOSIS — Z96641 Presence of right artificial hip joint: Secondary | ICD-10-CM | POA: Diagnosis not present

## 2019-07-16 DIAGNOSIS — M1611 Unilateral primary osteoarthritis, right hip: Secondary | ICD-10-CM | POA: Diagnosis not present

## 2019-07-16 DIAGNOSIS — I10 Essential (primary) hypertension: Secondary | ICD-10-CM | POA: Diagnosis not present

## 2019-07-16 DIAGNOSIS — Z471 Aftercare following joint replacement surgery: Secondary | ICD-10-CM | POA: Diagnosis not present

## 2019-07-16 HISTORY — PX: HIP SURGERY: SHX245

## 2019-07-17 DIAGNOSIS — Z96641 Presence of right artificial hip joint: Secondary | ICD-10-CM | POA: Diagnosis not present

## 2019-07-17 DIAGNOSIS — Z7902 Long term (current) use of antithrombotics/antiplatelets: Secondary | ICD-10-CM | POA: Diagnosis not present

## 2019-07-17 DIAGNOSIS — J45909 Unspecified asthma, uncomplicated: Secondary | ICD-10-CM | POA: Diagnosis not present

## 2019-07-17 DIAGNOSIS — Z471 Aftercare following joint replacement surgery: Secondary | ICD-10-CM | POA: Diagnosis not present

## 2019-07-17 DIAGNOSIS — J449 Chronic obstructive pulmonary disease, unspecified: Secondary | ICD-10-CM | POA: Diagnosis present

## 2019-07-17 DIAGNOSIS — I10 Essential (primary) hypertension: Secondary | ICD-10-CM | POA: Diagnosis not present

## 2019-07-17 DIAGNOSIS — I509 Heart failure, unspecified: Secondary | ICD-10-CM | POA: Diagnosis not present

## 2019-07-17 DIAGNOSIS — Z794 Long term (current) use of insulin: Secondary | ICD-10-CM | POA: Diagnosis not present

## 2019-07-17 DIAGNOSIS — M2550 Pain in unspecified joint: Secondary | ICD-10-CM | POA: Diagnosis not present

## 2019-07-17 DIAGNOSIS — Z79899 Other long term (current) drug therapy: Secondary | ICD-10-CM | POA: Diagnosis not present

## 2019-07-17 DIAGNOSIS — M6259 Muscle wasting and atrophy, not elsewhere classified, multiple sites: Secondary | ICD-10-CM | POA: Diagnosis not present

## 2019-07-17 DIAGNOSIS — I2510 Atherosclerotic heart disease of native coronary artery without angina pectoris: Secondary | ICD-10-CM | POA: Diagnosis present

## 2019-07-17 DIAGNOSIS — M1611 Unilateral primary osteoarthritis, right hip: Secondary | ICD-10-CM | POA: Diagnosis not present

## 2019-07-17 DIAGNOSIS — R262 Difficulty in walking, not elsewhere classified: Secondary | ICD-10-CM | POA: Diagnosis not present

## 2019-07-17 DIAGNOSIS — R52 Pain, unspecified: Secondary | ICD-10-CM | POA: Diagnosis not present

## 2019-07-17 DIAGNOSIS — E119 Type 2 diabetes mellitus without complications: Secondary | ICD-10-CM | POA: Diagnosis not present

## 2019-07-17 DIAGNOSIS — Z7401 Bed confinement status: Secondary | ICD-10-CM | POA: Diagnosis not present

## 2019-07-17 DIAGNOSIS — I5032 Chronic diastolic (congestive) heart failure: Secondary | ICD-10-CM | POA: Diagnosis present

## 2019-07-17 DIAGNOSIS — Z8673 Personal history of transient ischemic attack (TIA), and cerebral infarction without residual deficits: Secondary | ICD-10-CM | POA: Diagnosis not present

## 2019-07-17 DIAGNOSIS — I252 Old myocardial infarction: Secondary | ICD-10-CM | POA: Diagnosis not present

## 2019-07-17 DIAGNOSIS — E785 Hyperlipidemia, unspecified: Secondary | ICD-10-CM | POA: Diagnosis present

## 2019-07-17 DIAGNOSIS — I110 Hypertensive heart disease with heart failure: Secondary | ICD-10-CM | POA: Diagnosis present

## 2019-07-17 DIAGNOSIS — Z742 Need for assistance at home and no other household member able to render care: Secondary | ICD-10-CM | POA: Diagnosis not present

## 2019-07-17 DIAGNOSIS — R5381 Other malaise: Secondary | ICD-10-CM | POA: Diagnosis not present

## 2019-07-17 DIAGNOSIS — Z8614 Personal history of Methicillin resistant Staphylococcus aureus infection: Secondary | ICD-10-CM | POA: Diagnosis not present

## 2019-07-19 DIAGNOSIS — M6259 Muscle wasting and atrophy, not elsewhere classified, multiple sites: Secondary | ICD-10-CM | POA: Diagnosis not present

## 2019-07-19 DIAGNOSIS — Z7401 Bed confinement status: Secondary | ICD-10-CM | POA: Diagnosis not present

## 2019-07-19 DIAGNOSIS — D649 Anemia, unspecified: Secondary | ICD-10-CM | POA: Diagnosis not present

## 2019-07-19 DIAGNOSIS — N1831 Chronic kidney disease, stage 3a: Secondary | ICD-10-CM | POA: Diagnosis not present

## 2019-07-19 DIAGNOSIS — R5381 Other malaise: Secondary | ICD-10-CM | POA: Diagnosis not present

## 2019-07-19 DIAGNOSIS — R262 Difficulty in walking, not elsewhere classified: Secondary | ICD-10-CM | POA: Diagnosis not present

## 2019-07-19 DIAGNOSIS — M1611 Unilateral primary osteoarthritis, right hip: Secondary | ICD-10-CM | POA: Diagnosis not present

## 2019-07-19 DIAGNOSIS — G8918 Other acute postprocedural pain: Secondary | ICD-10-CM | POA: Diagnosis not present

## 2019-07-19 DIAGNOSIS — J45909 Unspecified asthma, uncomplicated: Secondary | ICD-10-CM | POA: Diagnosis not present

## 2019-07-19 DIAGNOSIS — E785 Hyperlipidemia, unspecified: Secondary | ICD-10-CM | POA: Diagnosis not present

## 2019-07-19 DIAGNOSIS — E119 Type 2 diabetes mellitus without complications: Secondary | ICD-10-CM | POA: Diagnosis not present

## 2019-07-19 DIAGNOSIS — Z471 Aftercare following joint replacement surgery: Secondary | ICD-10-CM | POA: Diagnosis not present

## 2019-07-19 DIAGNOSIS — Z96641 Presence of right artificial hip joint: Secondary | ICD-10-CM | POA: Diagnosis not present

## 2019-07-19 DIAGNOSIS — R52 Pain, unspecified: Secondary | ICD-10-CM | POA: Diagnosis not present

## 2019-07-19 DIAGNOSIS — I119 Hypertensive heart disease without heart failure: Secondary | ICD-10-CM | POA: Diagnosis not present

## 2019-07-19 DIAGNOSIS — M2550 Pain in unspecified joint: Secondary | ICD-10-CM | POA: Diagnosis not present

## 2019-07-19 DIAGNOSIS — E1151 Type 2 diabetes mellitus with diabetic peripheral angiopathy without gangrene: Secondary | ICD-10-CM | POA: Diagnosis not present

## 2019-07-19 DIAGNOSIS — I10 Essential (primary) hypertension: Secondary | ICD-10-CM | POA: Diagnosis not present

## 2019-07-19 DIAGNOSIS — Z742 Need for assistance at home and no other household member able to render care: Secondary | ICD-10-CM | POA: Diagnosis not present

## 2019-07-19 DIAGNOSIS — I509 Heart failure, unspecified: Secondary | ICD-10-CM | POA: Diagnosis not present

## 2019-07-22 DIAGNOSIS — R262 Difficulty in walking, not elsewhere classified: Secondary | ICD-10-CM | POA: Diagnosis not present

## 2019-07-22 DIAGNOSIS — Z96641 Presence of right artificial hip joint: Secondary | ICD-10-CM | POA: Diagnosis not present

## 2019-07-22 DIAGNOSIS — D649 Anemia, unspecified: Secondary | ICD-10-CM | POA: Diagnosis not present

## 2019-07-22 DIAGNOSIS — G8918 Other acute postprocedural pain: Secondary | ICD-10-CM | POA: Diagnosis not present

## 2019-07-30 ENCOUNTER — Other Ambulatory Visit: Payer: Self-pay | Admitting: *Deleted

## 2019-07-30 NOTE — Patient Outreach (Signed)
Screened for potential [facility] Care Management needs as a benefit of  [employer].  [name] is currently receiving skilled therapy at [facility].   Writer attended telephonic interdisciplinary team meeting to assess for disposition needs and transition plan for resident.   Facility reports member is slow to progress with therapy. She was at ALF prior but facility states member is adamant that she wants to return home with husband. She was active with [facility] Care Management in the past.   Will plan outreach to member and will continue to follow for transition plans while member remains in SNF.   Raiford Noble, MSN-Ed, RN,BSN [facility] Post Acute Care Coordinator [phone] [facility]) [phone]  (Toll free office)

## 2019-08-04 ENCOUNTER — Other Ambulatory Visit: Payer: Self-pay | Admitting: *Deleted

## 2019-08-04 NOTE — Patient Outreach (Signed)
[facility] Post-Acute Care Coordinator follow up.  [name] is currently receiving skilled therapy at [facility].  Telephone call made to [name] [phone]. Patient identifiers confirmed.  [name] states she is walking more with therapy. She is very motivated. States she does not want to have to return to ALF but states she will if she must. States she had to pay 2400 dollars a month out of pocket. States "that can be very costly."   States her husband has been in the hospital with health issues himself. She also affirms that her husband has had a drinking problem due to PTSD. States he plans to stop drinking.  Encouraged [name] to strongly consider returning to ALF since it is possible she will not have the support at home that will be needed.   [name] reports she will has a scheduled ortho appointment this Thursday.  Discussed that writer will continue to follow her while she resides in SNF. Will plan outreach again as appropriate.   Raiford Noble, MSN-Ed, RN,BSN [facility] Post Acute Care Coordinator [phone] [facility]) [phone]  (Toll free office)

## 2019-08-28 DIAGNOSIS — I119 Hypertensive heart disease without heart failure: Secondary | ICD-10-CM | POA: Diagnosis not present

## 2019-08-28 DIAGNOSIS — E785 Hyperlipidemia, unspecified: Secondary | ICD-10-CM | POA: Diagnosis not present

## 2019-08-28 DIAGNOSIS — E1151 Type 2 diabetes mellitus with diabetic peripheral angiopathy without gangrene: Secondary | ICD-10-CM | POA: Diagnosis not present

## 2019-08-28 DIAGNOSIS — N1831 Chronic kidney disease, stage 3a: Secondary | ICD-10-CM | POA: Diagnosis not present

## 2019-09-03 ENCOUNTER — Other Ambulatory Visit: Payer: Self-pay | Admitting: *Deleted

## 2019-09-03 NOTE — Patient Outreach (Signed)
[facility] Post-Acute Care Coordinator follow up  Mrs. [name] remains at [facility]. Transition plan is to return home with husband. Facility previously had home evaluation visit. Recommendations were made. Ramp to be built for member's entry to home.   Telephone call made to Mrs. Schembri [phone]. No answer. Unable to leave message since voicemail box not set up yet.   Will try back at later time to discuss [facility] follow up.  Raiford Noble, MSN-Ed, RN,BSN [facility] Post Acute Care Coordinator [phone] [facility]) [phone]  (Toll free office)

## 2019-09-04 ENCOUNTER — Other Ambulatory Visit: Payer: Self-pay | Admitting: *Deleted

## 2019-09-04 DIAGNOSIS — I10 Essential (primary) hypertension: Secondary | ICD-10-CM

## 2019-09-04 NOTE — Patient Outreach (Addendum)
[facility] Post- Acute Care Coordinator follow up  Telephone call to Mrs. Oestreich [phone]. Patient identifiers confirmed. Mrs. Essner endorses that she will transition to home on tomorrow 09/05/19. She will have home health with [facility]. She will return home with her husband. States her husband sounds good every time she speaks with him. States she thinks he has cut back on his drinking a lot. States her sister will assist her at home as needed. Also states a ramp was built to accommodate her. Note facility home assessment/eval previously performed.  Agreeable to [facility] Care Management services. Will make referral to [facility] Care Management RNCM for complex case management. Mrs. Heinzelman denies needs for meals and transportation assistance.  Mrs. Sult states the facility will make her f/u appointment with Dr. Tomasa Blase.   Member has history of diabetes, lower extremity neuropathy, and CHF, HTN, CVA, HLD.  Mrs. Bevill to transition to home on tomorrow 09/05/19.   Raiford Noble, MSN-Ed, RN,BSN [facility] Post Acute Care Coordinator [phone] [facility]) [phone]  (Toll free office)

## 2019-09-07 DIAGNOSIS — Z8673 Personal history of transient ischemic attack (TIA), and cerebral infarction without residual deficits: Secondary | ICD-10-CM | POA: Diagnosis not present

## 2019-09-07 DIAGNOSIS — Z96641 Presence of right artificial hip joint: Secondary | ICD-10-CM | POA: Diagnosis not present

## 2019-09-07 DIAGNOSIS — I509 Heart failure, unspecified: Secondary | ICD-10-CM | POA: Diagnosis not present

## 2019-09-07 DIAGNOSIS — E1122 Type 2 diabetes mellitus with diabetic chronic kidney disease: Secondary | ICD-10-CM | POA: Diagnosis not present

## 2019-09-07 DIAGNOSIS — D631 Anemia in chronic kidney disease: Secondary | ICD-10-CM | POA: Diagnosis not present

## 2019-09-07 DIAGNOSIS — J45909 Unspecified asthma, uncomplicated: Secondary | ICD-10-CM | POA: Diagnosis not present

## 2019-09-07 DIAGNOSIS — I130 Hypertensive heart and chronic kidney disease with heart failure and stage 1 through stage 4 chronic kidney disease, or unspecified chronic kidney disease: Secondary | ICD-10-CM | POA: Diagnosis not present

## 2019-09-07 DIAGNOSIS — K219 Gastro-esophageal reflux disease without esophagitis: Secondary | ICD-10-CM | POA: Diagnosis not present

## 2019-09-07 DIAGNOSIS — H9190 Unspecified hearing loss, unspecified ear: Secondary | ICD-10-CM | POA: Diagnosis not present

## 2019-09-07 DIAGNOSIS — E785 Hyperlipidemia, unspecified: Secondary | ICD-10-CM | POA: Diagnosis not present

## 2019-09-07 DIAGNOSIS — Z471 Aftercare following joint replacement surgery: Secondary | ICD-10-CM | POA: Diagnosis not present

## 2019-09-07 DIAGNOSIS — E1140 Type 2 diabetes mellitus with diabetic neuropathy, unspecified: Secondary | ICD-10-CM | POA: Diagnosis not present

## 2019-09-07 DIAGNOSIS — Z7901 Long term (current) use of anticoagulants: Secondary | ICD-10-CM | POA: Diagnosis not present

## 2019-09-07 DIAGNOSIS — M810 Age-related osteoporosis without current pathological fracture: Secondary | ICD-10-CM | POA: Diagnosis not present

## 2019-09-07 DIAGNOSIS — N189 Chronic kidney disease, unspecified: Secondary | ICD-10-CM | POA: Diagnosis not present

## 2019-09-07 DIAGNOSIS — K5904 Chronic idiopathic constipation: Secondary | ICD-10-CM | POA: Diagnosis not present

## 2019-09-08 ENCOUNTER — Other Ambulatory Visit: Payer: Self-pay

## 2019-09-08 NOTE — Patient Outreach (Signed)
[facility]) Care Management  09/08/2019  [name] 04/25/1946 [id number]   Transition of care: Discharged from SNF on 09/05/3019  Primary MD: Dr. Tomasa Blase  Placed call to patient with no answer. Voicemail not set up.  PLAN: Will Higher education careers adviser and attempt again in 3 days.  Rowe Pavy, RN, BSN, CEN [facility] [employer] [phone]

## 2019-09-10 DIAGNOSIS — I5030 Unspecified diastolic (congestive) heart failure: Secondary | ICD-10-CM | POA: Diagnosis not present

## 2019-09-10 DIAGNOSIS — K589 Irritable bowel syndrome without diarrhea: Secondary | ICD-10-CM | POA: Diagnosis not present

## 2019-09-11 ENCOUNTER — Other Ambulatory Visit: Payer: Self-pay

## 2019-09-11 DIAGNOSIS — E1140 Type 2 diabetes mellitus with diabetic neuropathy, unspecified: Secondary | ICD-10-CM | POA: Diagnosis not present

## 2019-09-11 DIAGNOSIS — I130 Hypertensive heart and chronic kidney disease with heart failure and stage 1 through stage 4 chronic kidney disease, or unspecified chronic kidney disease: Secondary | ICD-10-CM | POA: Diagnosis not present

## 2019-09-11 DIAGNOSIS — N189 Chronic kidney disease, unspecified: Secondary | ICD-10-CM | POA: Diagnosis not present

## 2019-09-11 DIAGNOSIS — E1122 Type 2 diabetes mellitus with diabetic chronic kidney disease: Secondary | ICD-10-CM | POA: Diagnosis not present

## 2019-09-11 DIAGNOSIS — Z471 Aftercare following joint replacement surgery: Secondary | ICD-10-CM | POA: Diagnosis not present

## 2019-09-11 DIAGNOSIS — I509 Heart failure, unspecified: Secondary | ICD-10-CM | POA: Diagnosis not present

## 2019-09-11 NOTE — Patient Outreach (Signed)
[facility]) Care Management  09/11/2019  [name] 1945/07/14 [id number]   Telephone assessment:  Outreach attempt to patient which was unsuccessful.   PLAN: Letter already mailed. Will attempt 3rd outreach in 3 days.  Rowe Pavy, RN, BSN, CEN [facility] [employer] [phone]

## 2019-09-12 DIAGNOSIS — M1610 Unilateral primary osteoarthritis, unspecified hip: Secondary | ICD-10-CM | POA: Diagnosis not present

## 2019-09-12 DIAGNOSIS — I130 Hypertensive heart and chronic kidney disease with heart failure and stage 1 through stage 4 chronic kidney disease, or unspecified chronic kidney disease: Secondary | ICD-10-CM | POA: Diagnosis not present

## 2019-09-12 DIAGNOSIS — Z96641 Presence of right artificial hip joint: Secondary | ICD-10-CM | POA: Diagnosis not present

## 2019-09-12 DIAGNOSIS — Z471 Aftercare following joint replacement surgery: Secondary | ICD-10-CM | POA: Diagnosis not present

## 2019-09-12 DIAGNOSIS — E1122 Type 2 diabetes mellitus with diabetic chronic kidney disease: Secondary | ICD-10-CM | POA: Diagnosis not present

## 2019-09-12 DIAGNOSIS — E1140 Type 2 diabetes mellitus with diabetic neuropathy, unspecified: Secondary | ICD-10-CM | POA: Diagnosis not present

## 2019-09-12 DIAGNOSIS — N189 Chronic kidney disease, unspecified: Secondary | ICD-10-CM | POA: Diagnosis not present

## 2019-09-12 DIAGNOSIS — I509 Heart failure, unspecified: Secondary | ICD-10-CM | POA: Diagnosis not present

## 2019-09-15 DIAGNOSIS — I130 Hypertensive heart and chronic kidney disease with heart failure and stage 1 through stage 4 chronic kidney disease, or unspecified chronic kidney disease: Secondary | ICD-10-CM | POA: Diagnosis not present

## 2019-09-15 DIAGNOSIS — I509 Heart failure, unspecified: Secondary | ICD-10-CM | POA: Diagnosis not present

## 2019-09-15 DIAGNOSIS — E1122 Type 2 diabetes mellitus with diabetic chronic kidney disease: Secondary | ICD-10-CM | POA: Diagnosis not present

## 2019-09-15 DIAGNOSIS — N189 Chronic kidney disease, unspecified: Secondary | ICD-10-CM | POA: Diagnosis not present

## 2019-09-15 DIAGNOSIS — Z471 Aftercare following joint replacement surgery: Secondary | ICD-10-CM | POA: Diagnosis not present

## 2019-09-15 DIAGNOSIS — E1140 Type 2 diabetes mellitus with diabetic neuropathy, unspecified: Secondary | ICD-10-CM | POA: Diagnosis not present

## 2019-09-16 ENCOUNTER — Other Ambulatory Visit: Payer: Self-pay

## 2019-09-16 DIAGNOSIS — E1122 Type 2 diabetes mellitus with diabetic chronic kidney disease: Secondary | ICD-10-CM | POA: Diagnosis not present

## 2019-09-16 DIAGNOSIS — Z471 Aftercare following joint replacement surgery: Secondary | ICD-10-CM | POA: Diagnosis not present

## 2019-09-16 DIAGNOSIS — N189 Chronic kidney disease, unspecified: Secondary | ICD-10-CM | POA: Diagnosis not present

## 2019-09-16 DIAGNOSIS — E1140 Type 2 diabetes mellitus with diabetic neuropathy, unspecified: Secondary | ICD-10-CM | POA: Diagnosis not present

## 2019-09-16 DIAGNOSIS — I509 Heart failure, unspecified: Secondary | ICD-10-CM | POA: Diagnosis not present

## 2019-09-16 DIAGNOSIS — I130 Hypertensive heart and chronic kidney disease with heart failure and stage 1 through stage 4 chronic kidney disease, or unspecified chronic kidney disease: Secondary | ICD-10-CM | POA: Diagnosis not present

## 2019-09-17 NOTE — Patient Outreach (Signed)
[facility]) Care Management  09/17/2019  ALIHA DIEDRICH [birth year] [id number]   Transition of care/ case closure: 09/16/2019  Late entry Placed call to patient, who identified herself. Reports she is doing well. Reports she had difficulty getting in to her home because she needed to step up. Reports now that she has a ramp and everything is going well. Reports she does her leg exercises as directed. Reports she is taking her medications as prescribed.  States she is walking with a walker, Reports her leg is 1.5 inches shorter. Reports wearing a heel insert in her shoes.   Reports Dm is under good control with an A1c of 6.0.   States her sister are assisting with her needs and so is her husband. Reports being active with home health. Denies any needs today but will allow me to mail a letter and she will call me in the future if she needs any assistance. She did ask about housekeeping services and I reviewed regional consolidated services.  Offered to make a Child psychotherapist referral at this time and she declined.  PLAN: refused to participated. Denies needs at this time. Agreement to letter to be mailed to her house. Encouraged patient to call me for needs.   Rowe Pavy, RN, BSN, CEN [facility] [employer] [phone]

## 2019-09-18 DIAGNOSIS — E1140 Type 2 diabetes mellitus with diabetic neuropathy, unspecified: Secondary | ICD-10-CM | POA: Diagnosis not present

## 2019-09-18 DIAGNOSIS — I509 Heart failure, unspecified: Secondary | ICD-10-CM | POA: Diagnosis not present

## 2019-09-18 DIAGNOSIS — N189 Chronic kidney disease, unspecified: Secondary | ICD-10-CM | POA: Diagnosis not present

## 2019-09-18 DIAGNOSIS — I130 Hypertensive heart and chronic kidney disease with heart failure and stage 1 through stage 4 chronic kidney disease, or unspecified chronic kidney disease: Secondary | ICD-10-CM | POA: Diagnosis not present

## 2019-09-18 DIAGNOSIS — E1122 Type 2 diabetes mellitus with diabetic chronic kidney disease: Secondary | ICD-10-CM | POA: Diagnosis not present

## 2019-09-18 DIAGNOSIS — Z471 Aftercare following joint replacement surgery: Secondary | ICD-10-CM | POA: Diagnosis not present

## 2019-09-22 DIAGNOSIS — I509 Heart failure, unspecified: Secondary | ICD-10-CM | POA: Diagnosis not present

## 2019-09-22 DIAGNOSIS — E1122 Type 2 diabetes mellitus with diabetic chronic kidney disease: Secondary | ICD-10-CM | POA: Diagnosis not present

## 2019-09-22 DIAGNOSIS — Z471 Aftercare following joint replacement surgery: Secondary | ICD-10-CM | POA: Diagnosis not present

## 2019-09-22 DIAGNOSIS — E1140 Type 2 diabetes mellitus with diabetic neuropathy, unspecified: Secondary | ICD-10-CM | POA: Diagnosis not present

## 2019-09-22 DIAGNOSIS — N189 Chronic kidney disease, unspecified: Secondary | ICD-10-CM | POA: Diagnosis not present

## 2019-09-22 DIAGNOSIS — I130 Hypertensive heart and chronic kidney disease with heart failure and stage 1 through stage 4 chronic kidney disease, or unspecified chronic kidney disease: Secondary | ICD-10-CM | POA: Diagnosis not present

## 2019-09-23 DIAGNOSIS — E1122 Type 2 diabetes mellitus with diabetic chronic kidney disease: Secondary | ICD-10-CM | POA: Diagnosis not present

## 2019-09-23 DIAGNOSIS — E1140 Type 2 diabetes mellitus with diabetic neuropathy, unspecified: Secondary | ICD-10-CM | POA: Diagnosis not present

## 2019-09-23 DIAGNOSIS — I509 Heart failure, unspecified: Secondary | ICD-10-CM | POA: Diagnosis not present

## 2019-09-23 DIAGNOSIS — Z471 Aftercare following joint replacement surgery: Secondary | ICD-10-CM | POA: Diagnosis not present

## 2019-09-23 DIAGNOSIS — I130 Hypertensive heart and chronic kidney disease with heart failure and stage 1 through stage 4 chronic kidney disease, or unspecified chronic kidney disease: Secondary | ICD-10-CM | POA: Diagnosis not present

## 2019-09-23 DIAGNOSIS — N189 Chronic kidney disease, unspecified: Secondary | ICD-10-CM | POA: Diagnosis not present

## 2019-09-25 DIAGNOSIS — E1122 Type 2 diabetes mellitus with diabetic chronic kidney disease: Secondary | ICD-10-CM | POA: Diagnosis not present

## 2019-09-25 DIAGNOSIS — E1140 Type 2 diabetes mellitus with diabetic neuropathy, unspecified: Secondary | ICD-10-CM | POA: Diagnosis not present

## 2019-09-25 DIAGNOSIS — I509 Heart failure, unspecified: Secondary | ICD-10-CM | POA: Diagnosis not present

## 2019-09-25 DIAGNOSIS — N189 Chronic kidney disease, unspecified: Secondary | ICD-10-CM | POA: Diagnosis not present

## 2019-09-25 DIAGNOSIS — Z471 Aftercare following joint replacement surgery: Secondary | ICD-10-CM | POA: Diagnosis not present

## 2019-09-25 DIAGNOSIS — I130 Hypertensive heart and chronic kidney disease with heart failure and stage 1 through stage 4 chronic kidney disease, or unspecified chronic kidney disease: Secondary | ICD-10-CM | POA: Diagnosis not present

## 2019-09-29 DIAGNOSIS — N189 Chronic kidney disease, unspecified: Secondary | ICD-10-CM | POA: Diagnosis not present

## 2019-09-29 DIAGNOSIS — I509 Heart failure, unspecified: Secondary | ICD-10-CM | POA: Diagnosis not present

## 2019-09-29 DIAGNOSIS — Z471 Aftercare following joint replacement surgery: Secondary | ICD-10-CM | POA: Diagnosis not present

## 2019-09-29 DIAGNOSIS — I130 Hypertensive heart and chronic kidney disease with heart failure and stage 1 through stage 4 chronic kidney disease, or unspecified chronic kidney disease: Secondary | ICD-10-CM | POA: Diagnosis not present

## 2019-09-29 DIAGNOSIS — E1122 Type 2 diabetes mellitus with diabetic chronic kidney disease: Secondary | ICD-10-CM | POA: Diagnosis not present

## 2019-09-29 DIAGNOSIS — E1140 Type 2 diabetes mellitus with diabetic neuropathy, unspecified: Secondary | ICD-10-CM | POA: Diagnosis not present

## 2019-10-03 DIAGNOSIS — I509 Heart failure, unspecified: Secondary | ICD-10-CM | POA: Diagnosis not present

## 2019-10-03 DIAGNOSIS — I130 Hypertensive heart and chronic kidney disease with heart failure and stage 1 through stage 4 chronic kidney disease, or unspecified chronic kidney disease: Secondary | ICD-10-CM | POA: Diagnosis not present

## 2019-10-03 DIAGNOSIS — Z471 Aftercare following joint replacement surgery: Secondary | ICD-10-CM | POA: Diagnosis not present

## 2019-10-03 DIAGNOSIS — E1122 Type 2 diabetes mellitus with diabetic chronic kidney disease: Secondary | ICD-10-CM | POA: Diagnosis not present

## 2019-10-03 DIAGNOSIS — E1140 Type 2 diabetes mellitus with diabetic neuropathy, unspecified: Secondary | ICD-10-CM | POA: Diagnosis not present

## 2019-10-03 DIAGNOSIS — N189 Chronic kidney disease, unspecified: Secondary | ICD-10-CM | POA: Diagnosis not present

## 2019-10-07 DIAGNOSIS — E1122 Type 2 diabetes mellitus with diabetic chronic kidney disease: Secondary | ICD-10-CM | POA: Diagnosis not present

## 2019-10-07 DIAGNOSIS — M810 Age-related osteoporosis without current pathological fracture: Secondary | ICD-10-CM | POA: Diagnosis not present

## 2019-10-07 DIAGNOSIS — Z8673 Personal history of transient ischemic attack (TIA), and cerebral infarction without residual deficits: Secondary | ICD-10-CM | POA: Diagnosis not present

## 2019-10-07 DIAGNOSIS — K219 Gastro-esophageal reflux disease without esophagitis: Secondary | ICD-10-CM | POA: Diagnosis not present

## 2019-10-07 DIAGNOSIS — Z7901 Long term (current) use of anticoagulants: Secondary | ICD-10-CM | POA: Diagnosis not present

## 2019-10-07 DIAGNOSIS — Z96641 Presence of right artificial hip joint: Secondary | ICD-10-CM | POA: Diagnosis not present

## 2019-10-07 DIAGNOSIS — D631 Anemia in chronic kidney disease: Secondary | ICD-10-CM | POA: Diagnosis not present

## 2019-10-07 DIAGNOSIS — H9190 Unspecified hearing loss, unspecified ear: Secondary | ICD-10-CM | POA: Diagnosis not present

## 2019-10-07 DIAGNOSIS — N189 Chronic kidney disease, unspecified: Secondary | ICD-10-CM | POA: Diagnosis not present

## 2019-10-07 DIAGNOSIS — I509 Heart failure, unspecified: Secondary | ICD-10-CM | POA: Diagnosis not present

## 2019-10-07 DIAGNOSIS — I130 Hypertensive heart and chronic kidney disease with heart failure and stage 1 through stage 4 chronic kidney disease, or unspecified chronic kidney disease: Secondary | ICD-10-CM | POA: Diagnosis not present

## 2019-10-07 DIAGNOSIS — E785 Hyperlipidemia, unspecified: Secondary | ICD-10-CM | POA: Diagnosis not present

## 2019-10-07 DIAGNOSIS — K5904 Chronic idiopathic constipation: Secondary | ICD-10-CM | POA: Diagnosis not present

## 2019-10-07 DIAGNOSIS — Z471 Aftercare following joint replacement surgery: Secondary | ICD-10-CM | POA: Diagnosis not present

## 2019-10-07 DIAGNOSIS — J45909 Unspecified asthma, uncomplicated: Secondary | ICD-10-CM | POA: Diagnosis not present

## 2019-10-07 DIAGNOSIS — E1140 Type 2 diabetes mellitus with diabetic neuropathy, unspecified: Secondary | ICD-10-CM | POA: Diagnosis not present

## 2019-11-06 DIAGNOSIS — M810 Age-related osteoporosis without current pathological fracture: Secondary | ICD-10-CM | POA: Diagnosis not present

## 2019-11-06 DIAGNOSIS — Z96641 Presence of right artificial hip joint: Secondary | ICD-10-CM | POA: Diagnosis not present

## 2019-11-06 DIAGNOSIS — J45909 Unspecified asthma, uncomplicated: Secondary | ICD-10-CM | POA: Diagnosis not present

## 2019-11-06 DIAGNOSIS — Z471 Aftercare following joint replacement surgery: Secondary | ICD-10-CM | POA: Diagnosis not present

## 2019-11-06 DIAGNOSIS — H9190 Unspecified hearing loss, unspecified ear: Secondary | ICD-10-CM | POA: Diagnosis not present

## 2019-11-06 DIAGNOSIS — R2243 Localized swelling, mass and lump, lower limb, bilateral: Secondary | ICD-10-CM | POA: Diagnosis not present

## 2019-11-06 DIAGNOSIS — I509 Heart failure, unspecified: Secondary | ICD-10-CM | POA: Diagnosis not present

## 2019-11-06 DIAGNOSIS — E1122 Type 2 diabetes mellitus with diabetic chronic kidney disease: Secondary | ICD-10-CM | POA: Diagnosis not present

## 2019-11-06 DIAGNOSIS — K5904 Chronic idiopathic constipation: Secondary | ICD-10-CM | POA: Diagnosis not present

## 2019-11-06 DIAGNOSIS — I130 Hypertensive heart and chronic kidney disease with heart failure and stage 1 through stage 4 chronic kidney disease, or unspecified chronic kidney disease: Secondary | ICD-10-CM | POA: Diagnosis not present

## 2019-11-06 DIAGNOSIS — K219 Gastro-esophageal reflux disease without esophagitis: Secondary | ICD-10-CM | POA: Diagnosis not present

## 2019-11-06 DIAGNOSIS — E1140 Type 2 diabetes mellitus with diabetic neuropathy, unspecified: Secondary | ICD-10-CM | POA: Diagnosis not present

## 2019-11-06 DIAGNOSIS — Z7901 Long term (current) use of anticoagulants: Secondary | ICD-10-CM | POA: Diagnosis not present

## 2019-11-06 DIAGNOSIS — N189 Chronic kidney disease, unspecified: Secondary | ICD-10-CM | POA: Diagnosis not present

## 2019-11-06 DIAGNOSIS — Z8673 Personal history of transient ischemic attack (TIA), and cerebral infarction without residual deficits: Secondary | ICD-10-CM | POA: Diagnosis not present

## 2019-11-06 DIAGNOSIS — D631 Anemia in chronic kidney disease: Secondary | ICD-10-CM | POA: Diagnosis not present

## 2019-11-06 DIAGNOSIS — E785 Hyperlipidemia, unspecified: Secondary | ICD-10-CM | POA: Diagnosis not present

## 2019-11-12 DIAGNOSIS — Z471 Aftercare following joint replacement surgery: Secondary | ICD-10-CM | POA: Diagnosis not present

## 2019-11-12 DIAGNOSIS — I509 Heart failure, unspecified: Secondary | ICD-10-CM | POA: Diagnosis not present

## 2019-11-12 DIAGNOSIS — D631 Anemia in chronic kidney disease: Secondary | ICD-10-CM | POA: Diagnosis not present

## 2019-11-12 DIAGNOSIS — M810 Age-related osteoporosis without current pathological fracture: Secondary | ICD-10-CM | POA: Diagnosis not present

## 2019-11-12 DIAGNOSIS — N189 Chronic kidney disease, unspecified: Secondary | ICD-10-CM | POA: Diagnosis not present

## 2019-11-12 DIAGNOSIS — E1122 Type 2 diabetes mellitus with diabetic chronic kidney disease: Secondary | ICD-10-CM | POA: Diagnosis not present

## 2019-11-13 DIAGNOSIS — I509 Heart failure, unspecified: Secondary | ICD-10-CM | POA: Diagnosis not present

## 2019-11-13 DIAGNOSIS — D631 Anemia in chronic kidney disease: Secondary | ICD-10-CM | POA: Diagnosis not present

## 2019-11-13 DIAGNOSIS — Z471 Aftercare following joint replacement surgery: Secondary | ICD-10-CM | POA: Diagnosis not present

## 2019-11-13 DIAGNOSIS — E1122 Type 2 diabetes mellitus with diabetic chronic kidney disease: Secondary | ICD-10-CM | POA: Diagnosis not present

## 2019-11-13 DIAGNOSIS — M810 Age-related osteoporosis without current pathological fracture: Secondary | ICD-10-CM | POA: Diagnosis not present

## 2019-11-13 DIAGNOSIS — N189 Chronic kidney disease, unspecified: Secondary | ICD-10-CM | POA: Diagnosis not present

## 2019-11-18 DIAGNOSIS — I509 Heart failure, unspecified: Secondary | ICD-10-CM | POA: Diagnosis not present

## 2019-11-18 DIAGNOSIS — N189 Chronic kidney disease, unspecified: Secondary | ICD-10-CM | POA: Diagnosis not present

## 2019-11-18 DIAGNOSIS — E1122 Type 2 diabetes mellitus with diabetic chronic kidney disease: Secondary | ICD-10-CM | POA: Diagnosis not present

## 2019-11-18 DIAGNOSIS — M810 Age-related osteoporosis without current pathological fracture: Secondary | ICD-10-CM | POA: Diagnosis not present

## 2019-11-18 DIAGNOSIS — Z471 Aftercare following joint replacement surgery: Secondary | ICD-10-CM | POA: Diagnosis not present

## 2019-11-18 DIAGNOSIS — D631 Anemia in chronic kidney disease: Secondary | ICD-10-CM | POA: Diagnosis not present

## 2019-11-20 DIAGNOSIS — I509 Heart failure, unspecified: Secondary | ICD-10-CM | POA: Diagnosis not present

## 2019-11-20 DIAGNOSIS — M810 Age-related osteoporosis without current pathological fracture: Secondary | ICD-10-CM | POA: Diagnosis not present

## 2019-11-20 DIAGNOSIS — D631 Anemia in chronic kidney disease: Secondary | ICD-10-CM | POA: Diagnosis not present

## 2019-11-20 DIAGNOSIS — E1122 Type 2 diabetes mellitus with diabetic chronic kidney disease: Secondary | ICD-10-CM | POA: Diagnosis not present

## 2019-11-20 DIAGNOSIS — Z471 Aftercare following joint replacement surgery: Secondary | ICD-10-CM | POA: Diagnosis not present

## 2019-11-20 DIAGNOSIS — N189 Chronic kidney disease, unspecified: Secondary | ICD-10-CM | POA: Diagnosis not present

## 2019-11-25 DIAGNOSIS — D631 Anemia in chronic kidney disease: Secondary | ICD-10-CM | POA: Diagnosis not present

## 2019-11-25 DIAGNOSIS — I509 Heart failure, unspecified: Secondary | ICD-10-CM | POA: Diagnosis not present

## 2019-11-25 DIAGNOSIS — Z471 Aftercare following joint replacement surgery: Secondary | ICD-10-CM | POA: Diagnosis not present

## 2019-11-25 DIAGNOSIS — N189 Chronic kidney disease, unspecified: Secondary | ICD-10-CM | POA: Diagnosis not present

## 2019-11-25 DIAGNOSIS — M810 Age-related osteoporosis without current pathological fracture: Secondary | ICD-10-CM | POA: Diagnosis not present

## 2019-11-25 DIAGNOSIS — E1122 Type 2 diabetes mellitus with diabetic chronic kidney disease: Secondary | ICD-10-CM | POA: Diagnosis not present

## 2019-11-26 DIAGNOSIS — I509 Heart failure, unspecified: Secondary | ICD-10-CM | POA: Diagnosis not present

## 2019-11-26 DIAGNOSIS — N189 Chronic kidney disease, unspecified: Secondary | ICD-10-CM | POA: Diagnosis not present

## 2019-11-26 DIAGNOSIS — Z471 Aftercare following joint replacement surgery: Secondary | ICD-10-CM | POA: Diagnosis not present

## 2019-11-26 DIAGNOSIS — M810 Age-related osteoporosis without current pathological fracture: Secondary | ICD-10-CM | POA: Diagnosis not present

## 2019-11-26 DIAGNOSIS — E1122 Type 2 diabetes mellitus with diabetic chronic kidney disease: Secondary | ICD-10-CM | POA: Diagnosis not present

## 2019-11-26 DIAGNOSIS — D631 Anemia in chronic kidney disease: Secondary | ICD-10-CM | POA: Diagnosis not present

## 2019-12-02 DIAGNOSIS — E119 Type 2 diabetes mellitus without complications: Secondary | ICD-10-CM | POA: Diagnosis not present

## 2019-12-02 DIAGNOSIS — M79674 Pain in right toe(s): Secondary | ICD-10-CM | POA: Diagnosis not present

## 2019-12-02 DIAGNOSIS — B351 Tinea unguium: Secondary | ICD-10-CM | POA: Diagnosis not present

## 2019-12-03 DIAGNOSIS — M810 Age-related osteoporosis without current pathological fracture: Secondary | ICD-10-CM | POA: Diagnosis not present

## 2019-12-03 DIAGNOSIS — D631 Anemia in chronic kidney disease: Secondary | ICD-10-CM | POA: Diagnosis not present

## 2019-12-03 DIAGNOSIS — N189 Chronic kidney disease, unspecified: Secondary | ICD-10-CM | POA: Diagnosis not present

## 2019-12-03 DIAGNOSIS — E1122 Type 2 diabetes mellitus with diabetic chronic kidney disease: Secondary | ICD-10-CM | POA: Diagnosis not present

## 2019-12-03 DIAGNOSIS — Z471 Aftercare following joint replacement surgery: Secondary | ICD-10-CM | POA: Diagnosis not present

## 2019-12-03 DIAGNOSIS — I509 Heart failure, unspecified: Secondary | ICD-10-CM | POA: Diagnosis not present

## 2019-12-06 DIAGNOSIS — E1122 Type 2 diabetes mellitus with diabetic chronic kidney disease: Secondary | ICD-10-CM | POA: Diagnosis not present

## 2019-12-06 DIAGNOSIS — J45909 Unspecified asthma, uncomplicated: Secondary | ICD-10-CM | POA: Diagnosis not present

## 2019-12-06 DIAGNOSIS — I509 Heart failure, unspecified: Secondary | ICD-10-CM | POA: Diagnosis not present

## 2019-12-06 DIAGNOSIS — Z8673 Personal history of transient ischemic attack (TIA), and cerebral infarction without residual deficits: Secondary | ICD-10-CM | POA: Diagnosis not present

## 2019-12-06 DIAGNOSIS — Z471 Aftercare following joint replacement surgery: Secondary | ICD-10-CM | POA: Diagnosis not present

## 2019-12-06 DIAGNOSIS — K219 Gastro-esophageal reflux disease without esophagitis: Secondary | ICD-10-CM | POA: Diagnosis not present

## 2019-12-06 DIAGNOSIS — E785 Hyperlipidemia, unspecified: Secondary | ICD-10-CM | POA: Diagnosis not present

## 2019-12-06 DIAGNOSIS — Z96641 Presence of right artificial hip joint: Secondary | ICD-10-CM | POA: Diagnosis not present

## 2019-12-06 DIAGNOSIS — E1140 Type 2 diabetes mellitus with diabetic neuropathy, unspecified: Secondary | ICD-10-CM | POA: Diagnosis not present

## 2019-12-06 DIAGNOSIS — D631 Anemia in chronic kidney disease: Secondary | ICD-10-CM | POA: Diagnosis not present

## 2019-12-06 DIAGNOSIS — Z7901 Long term (current) use of anticoagulants: Secondary | ICD-10-CM | POA: Diagnosis not present

## 2019-12-06 DIAGNOSIS — H9190 Unspecified hearing loss, unspecified ear: Secondary | ICD-10-CM | POA: Diagnosis not present

## 2019-12-06 DIAGNOSIS — N189 Chronic kidney disease, unspecified: Secondary | ICD-10-CM | POA: Diagnosis not present

## 2019-12-06 DIAGNOSIS — M810 Age-related osteoporosis without current pathological fracture: Secondary | ICD-10-CM | POA: Diagnosis not present

## 2019-12-06 DIAGNOSIS — K5904 Chronic idiopathic constipation: Secondary | ICD-10-CM | POA: Diagnosis not present

## 2019-12-06 DIAGNOSIS — R2243 Localized swelling, mass and lump, lower limb, bilateral: Secondary | ICD-10-CM | POA: Diagnosis not present

## 2019-12-06 DIAGNOSIS — I130 Hypertensive heart and chronic kidney disease with heart failure and stage 1 through stage 4 chronic kidney disease, or unspecified chronic kidney disease: Secondary | ICD-10-CM | POA: Diagnosis not present

## 2019-12-08 DIAGNOSIS — I509 Heart failure, unspecified: Secondary | ICD-10-CM | POA: Diagnosis not present

## 2019-12-08 DIAGNOSIS — N189 Chronic kidney disease, unspecified: Secondary | ICD-10-CM | POA: Diagnosis not present

## 2019-12-08 DIAGNOSIS — R2243 Localized swelling, mass and lump, lower limb, bilateral: Secondary | ICD-10-CM | POA: Diagnosis not present

## 2019-12-08 DIAGNOSIS — Z471 Aftercare following joint replacement surgery: Secondary | ICD-10-CM | POA: Diagnosis not present

## 2019-12-08 DIAGNOSIS — E1122 Type 2 diabetes mellitus with diabetic chronic kidney disease: Secondary | ICD-10-CM | POA: Diagnosis not present

## 2019-12-08 DIAGNOSIS — M810 Age-related osteoporosis without current pathological fracture: Secondary | ICD-10-CM | POA: Diagnosis not present

## 2019-12-10 DIAGNOSIS — R2243 Localized swelling, mass and lump, lower limb, bilateral: Secondary | ICD-10-CM | POA: Diagnosis not present

## 2019-12-10 DIAGNOSIS — I509 Heart failure, unspecified: Secondary | ICD-10-CM | POA: Diagnosis not present

## 2019-12-10 DIAGNOSIS — Z471 Aftercare following joint replacement surgery: Secondary | ICD-10-CM | POA: Diagnosis not present

## 2019-12-10 DIAGNOSIS — E1122 Type 2 diabetes mellitus with diabetic chronic kidney disease: Secondary | ICD-10-CM | POA: Diagnosis not present

## 2019-12-10 DIAGNOSIS — M810 Age-related osteoporosis without current pathological fracture: Secondary | ICD-10-CM | POA: Diagnosis not present

## 2019-12-10 DIAGNOSIS — N189 Chronic kidney disease, unspecified: Secondary | ICD-10-CM | POA: Diagnosis not present

## 2019-12-15 DIAGNOSIS — E1165 Type 2 diabetes mellitus with hyperglycemia: Secondary | ICD-10-CM | POA: Diagnosis not present

## 2019-12-15 DIAGNOSIS — I5030 Unspecified diastolic (congestive) heart failure: Secondary | ICD-10-CM | POA: Diagnosis not present

## 2019-12-15 DIAGNOSIS — E785 Hyperlipidemia, unspecified: Secondary | ICD-10-CM | POA: Diagnosis not present

## 2019-12-15 DIAGNOSIS — I110 Hypertensive heart disease with heart failure: Secondary | ICD-10-CM | POA: Diagnosis not present

## 2019-12-15 DIAGNOSIS — E1142 Type 2 diabetes mellitus with diabetic polyneuropathy: Secondary | ICD-10-CM | POA: Diagnosis not present

## 2019-12-15 DIAGNOSIS — I5032 Chronic diastolic (congestive) heart failure: Secondary | ICD-10-CM | POA: Diagnosis not present

## 2019-12-15 DIAGNOSIS — D519 Vitamin B12 deficiency anemia, unspecified: Secondary | ICD-10-CM | POA: Diagnosis not present

## 2019-12-15 DIAGNOSIS — I6990 Unspecified sequelae of unspecified cerebrovascular disease: Secondary | ICD-10-CM | POA: Diagnosis not present

## 2019-12-16 DIAGNOSIS — Z471 Aftercare following joint replacement surgery: Secondary | ICD-10-CM | POA: Diagnosis not present

## 2019-12-16 DIAGNOSIS — M810 Age-related osteoporosis without current pathological fracture: Secondary | ICD-10-CM | POA: Diagnosis not present

## 2019-12-16 DIAGNOSIS — R2243 Localized swelling, mass and lump, lower limb, bilateral: Secondary | ICD-10-CM | POA: Diagnosis not present

## 2019-12-16 DIAGNOSIS — I509 Heart failure, unspecified: Secondary | ICD-10-CM | POA: Diagnosis not present

## 2019-12-16 DIAGNOSIS — N189 Chronic kidney disease, unspecified: Secondary | ICD-10-CM | POA: Diagnosis not present

## 2019-12-16 DIAGNOSIS — E1122 Type 2 diabetes mellitus with diabetic chronic kidney disease: Secondary | ICD-10-CM | POA: Diagnosis not present

## 2019-12-23 DIAGNOSIS — E1122 Type 2 diabetes mellitus with diabetic chronic kidney disease: Secondary | ICD-10-CM | POA: Diagnosis not present

## 2019-12-23 DIAGNOSIS — I509 Heart failure, unspecified: Secondary | ICD-10-CM | POA: Diagnosis not present

## 2019-12-23 DIAGNOSIS — Z471 Aftercare following joint replacement surgery: Secondary | ICD-10-CM | POA: Diagnosis not present

## 2019-12-23 DIAGNOSIS — R2243 Localized swelling, mass and lump, lower limb, bilateral: Secondary | ICD-10-CM | POA: Diagnosis not present

## 2019-12-23 DIAGNOSIS — N189 Chronic kidney disease, unspecified: Secondary | ICD-10-CM | POA: Diagnosis not present

## 2019-12-23 DIAGNOSIS — M810 Age-related osteoporosis without current pathological fracture: Secondary | ICD-10-CM | POA: Diagnosis not present

## 2019-12-25 DIAGNOSIS — Z471 Aftercare following joint replacement surgery: Secondary | ICD-10-CM | POA: Diagnosis not present

## 2019-12-25 DIAGNOSIS — M1611 Unilateral primary osteoarthritis, right hip: Secondary | ICD-10-CM | POA: Diagnosis not present

## 2019-12-25 DIAGNOSIS — I509 Heart failure, unspecified: Secondary | ICD-10-CM | POA: Diagnosis not present

## 2019-12-25 DIAGNOSIS — Z96641 Presence of right artificial hip joint: Secondary | ICD-10-CM | POA: Diagnosis not present

## 2019-12-25 DIAGNOSIS — N189 Chronic kidney disease, unspecified: Secondary | ICD-10-CM | POA: Diagnosis not present

## 2019-12-25 DIAGNOSIS — R2243 Localized swelling, mass and lump, lower limb, bilateral: Secondary | ICD-10-CM | POA: Diagnosis not present

## 2019-12-25 DIAGNOSIS — M810 Age-related osteoporosis without current pathological fracture: Secondary | ICD-10-CM | POA: Diagnosis not present

## 2019-12-25 DIAGNOSIS — E1122 Type 2 diabetes mellitus with diabetic chronic kidney disease: Secondary | ICD-10-CM | POA: Diagnosis not present

## 2019-12-31 DIAGNOSIS — Z471 Aftercare following joint replacement surgery: Secondary | ICD-10-CM | POA: Diagnosis not present

## 2019-12-31 DIAGNOSIS — M810 Age-related osteoporosis without current pathological fracture: Secondary | ICD-10-CM | POA: Diagnosis not present

## 2019-12-31 DIAGNOSIS — R2243 Localized swelling, mass and lump, lower limb, bilateral: Secondary | ICD-10-CM | POA: Diagnosis not present

## 2019-12-31 DIAGNOSIS — E1122 Type 2 diabetes mellitus with diabetic chronic kidney disease: Secondary | ICD-10-CM | POA: Diagnosis not present

## 2019-12-31 DIAGNOSIS — I509 Heart failure, unspecified: Secondary | ICD-10-CM | POA: Diagnosis not present

## 2019-12-31 DIAGNOSIS — N189 Chronic kidney disease, unspecified: Secondary | ICD-10-CM | POA: Diagnosis not present

## 2020-01-01 DIAGNOSIS — E1122 Type 2 diabetes mellitus with diabetic chronic kidney disease: Secondary | ICD-10-CM | POA: Diagnosis not present

## 2020-01-01 DIAGNOSIS — I509 Heart failure, unspecified: Secondary | ICD-10-CM | POA: Diagnosis not present

## 2020-01-01 DIAGNOSIS — Z471 Aftercare following joint replacement surgery: Secondary | ICD-10-CM | POA: Diagnosis not present

## 2020-01-01 DIAGNOSIS — M810 Age-related osteoporosis without current pathological fracture: Secondary | ICD-10-CM | POA: Diagnosis not present

## 2020-01-01 DIAGNOSIS — N189 Chronic kidney disease, unspecified: Secondary | ICD-10-CM | POA: Diagnosis not present

## 2020-01-01 DIAGNOSIS — R2243 Localized swelling, mass and lump, lower limb, bilateral: Secondary | ICD-10-CM | POA: Diagnosis not present

## 2020-01-05 DIAGNOSIS — Z8673 Personal history of transient ischemic attack (TIA), and cerebral infarction without residual deficits: Secondary | ICD-10-CM | POA: Diagnosis not present

## 2020-01-05 DIAGNOSIS — D631 Anemia in chronic kidney disease: Secondary | ICD-10-CM | POA: Diagnosis not present

## 2020-01-05 DIAGNOSIS — E1140 Type 2 diabetes mellitus with diabetic neuropathy, unspecified: Secondary | ICD-10-CM | POA: Diagnosis not present

## 2020-01-05 DIAGNOSIS — K219 Gastro-esophageal reflux disease without esophagitis: Secondary | ICD-10-CM | POA: Diagnosis not present

## 2020-01-05 DIAGNOSIS — N189 Chronic kidney disease, unspecified: Secondary | ICD-10-CM | POA: Diagnosis not present

## 2020-01-05 DIAGNOSIS — E1122 Type 2 diabetes mellitus with diabetic chronic kidney disease: Secondary | ICD-10-CM | POA: Diagnosis not present

## 2020-01-05 DIAGNOSIS — M810 Age-related osteoporosis without current pathological fracture: Secondary | ICD-10-CM | POA: Diagnosis not present

## 2020-01-05 DIAGNOSIS — I509 Heart failure, unspecified: Secondary | ICD-10-CM | POA: Diagnosis not present

## 2020-01-05 DIAGNOSIS — R2243 Localized swelling, mass and lump, lower limb, bilateral: Secondary | ICD-10-CM | POA: Diagnosis not present

## 2020-01-05 DIAGNOSIS — E785 Hyperlipidemia, unspecified: Secondary | ICD-10-CM | POA: Diagnosis not present

## 2020-01-05 DIAGNOSIS — K5904 Chronic idiopathic constipation: Secondary | ICD-10-CM | POA: Diagnosis not present

## 2020-01-05 DIAGNOSIS — Z7901 Long term (current) use of anticoagulants: Secondary | ICD-10-CM | POA: Diagnosis not present

## 2020-01-05 DIAGNOSIS — Z96641 Presence of right artificial hip joint: Secondary | ICD-10-CM | POA: Diagnosis not present

## 2020-01-05 DIAGNOSIS — J45909 Unspecified asthma, uncomplicated: Secondary | ICD-10-CM | POA: Diagnosis not present

## 2020-01-05 DIAGNOSIS — H9190 Unspecified hearing loss, unspecified ear: Secondary | ICD-10-CM | POA: Diagnosis not present

## 2020-01-05 DIAGNOSIS — I130 Hypertensive heart and chronic kidney disease with heart failure and stage 1 through stage 4 chronic kidney disease, or unspecified chronic kidney disease: Secondary | ICD-10-CM | POA: Diagnosis not present

## 2020-01-08 DIAGNOSIS — E1122 Type 2 diabetes mellitus with diabetic chronic kidney disease: Secondary | ICD-10-CM | POA: Diagnosis not present

## 2020-01-08 DIAGNOSIS — D631 Anemia in chronic kidney disease: Secondary | ICD-10-CM | POA: Diagnosis not present

## 2020-01-08 DIAGNOSIS — I509 Heart failure, unspecified: Secondary | ICD-10-CM | POA: Diagnosis not present

## 2020-01-08 DIAGNOSIS — N189 Chronic kidney disease, unspecified: Secondary | ICD-10-CM | POA: Diagnosis not present

## 2020-01-08 DIAGNOSIS — R2243 Localized swelling, mass and lump, lower limb, bilateral: Secondary | ICD-10-CM | POA: Diagnosis not present

## 2020-01-08 DIAGNOSIS — M810 Age-related osteoporosis without current pathological fracture: Secondary | ICD-10-CM | POA: Diagnosis not present

## 2020-01-14 DIAGNOSIS — N189 Chronic kidney disease, unspecified: Secondary | ICD-10-CM | POA: Diagnosis not present

## 2020-01-14 DIAGNOSIS — M810 Age-related osteoporosis without current pathological fracture: Secondary | ICD-10-CM | POA: Diagnosis not present

## 2020-01-14 DIAGNOSIS — R2243 Localized swelling, mass and lump, lower limb, bilateral: Secondary | ICD-10-CM | POA: Diagnosis not present

## 2020-01-14 DIAGNOSIS — E1122 Type 2 diabetes mellitus with diabetic chronic kidney disease: Secondary | ICD-10-CM | POA: Diagnosis not present

## 2020-01-14 DIAGNOSIS — D631 Anemia in chronic kidney disease: Secondary | ICD-10-CM | POA: Diagnosis not present

## 2020-01-14 DIAGNOSIS — I509 Heart failure, unspecified: Secondary | ICD-10-CM | POA: Diagnosis not present

## 2020-01-15 DIAGNOSIS — D519 Vitamin B12 deficiency anemia, unspecified: Secondary | ICD-10-CM | POA: Diagnosis not present

## 2020-01-20 DIAGNOSIS — N189 Chronic kidney disease, unspecified: Secondary | ICD-10-CM | POA: Diagnosis not present

## 2020-01-20 DIAGNOSIS — D631 Anemia in chronic kidney disease: Secondary | ICD-10-CM | POA: Diagnosis not present

## 2020-01-20 DIAGNOSIS — M810 Age-related osteoporosis without current pathological fracture: Secondary | ICD-10-CM | POA: Diagnosis not present

## 2020-01-20 DIAGNOSIS — R2243 Localized swelling, mass and lump, lower limb, bilateral: Secondary | ICD-10-CM | POA: Diagnosis not present

## 2020-01-20 DIAGNOSIS — E1122 Type 2 diabetes mellitus with diabetic chronic kidney disease: Secondary | ICD-10-CM | POA: Diagnosis not present

## 2020-01-20 DIAGNOSIS — I509 Heart failure, unspecified: Secondary | ICD-10-CM | POA: Diagnosis not present

## 2020-01-27 DIAGNOSIS — D631 Anemia in chronic kidney disease: Secondary | ICD-10-CM | POA: Diagnosis not present

## 2020-01-27 DIAGNOSIS — I509 Heart failure, unspecified: Secondary | ICD-10-CM | POA: Diagnosis not present

## 2020-01-27 DIAGNOSIS — R2243 Localized swelling, mass and lump, lower limb, bilateral: Secondary | ICD-10-CM | POA: Diagnosis not present

## 2020-01-27 DIAGNOSIS — E1122 Type 2 diabetes mellitus with diabetic chronic kidney disease: Secondary | ICD-10-CM | POA: Diagnosis not present

## 2020-01-27 DIAGNOSIS — M810 Age-related osteoporosis without current pathological fracture: Secondary | ICD-10-CM | POA: Diagnosis not present

## 2020-01-27 DIAGNOSIS — N189 Chronic kidney disease, unspecified: Secondary | ICD-10-CM | POA: Diagnosis not present

## 2020-01-30 ENCOUNTER — Ambulatory Visit (INDEPENDENT_AMBULATORY_CARE_PROVIDER_SITE_OTHER): Payer: Medicare Other | Admitting: Cardiology

## 2020-01-30 ENCOUNTER — Encounter: Payer: Self-pay | Admitting: Cardiology

## 2020-01-30 ENCOUNTER — Other Ambulatory Visit: Payer: Self-pay

## 2020-01-30 VITALS — BP 120/62 | HR 103 | Ht 63.0 in | Wt 210.0 lb

## 2020-01-30 DIAGNOSIS — M810 Age-related osteoporosis without current pathological fracture: Secondary | ICD-10-CM | POA: Diagnosis not present

## 2020-01-30 DIAGNOSIS — I209 Angina pectoris, unspecified: Secondary | ICD-10-CM | POA: Diagnosis not present

## 2020-01-30 DIAGNOSIS — I110 Hypertensive heart disease with heart failure: Secondary | ICD-10-CM | POA: Diagnosis not present

## 2020-01-30 DIAGNOSIS — J449 Chronic obstructive pulmonary disease, unspecified: Secondary | ICD-10-CM

## 2020-01-30 DIAGNOSIS — I509 Heart failure, unspecified: Secondary | ICD-10-CM | POA: Diagnosis not present

## 2020-01-30 DIAGNOSIS — E1122 Type 2 diabetes mellitus with diabetic chronic kidney disease: Secondary | ICD-10-CM | POA: Diagnosis not present

## 2020-01-30 DIAGNOSIS — R2243 Localized swelling, mass and lump, lower limb, bilateral: Secondary | ICD-10-CM | POA: Diagnosis not present

## 2020-01-30 DIAGNOSIS — E785 Hyperlipidemia, unspecified: Secondary | ICD-10-CM | POA: Diagnosis not present

## 2020-01-30 DIAGNOSIS — I5032 Chronic diastolic (congestive) heart failure: Secondary | ICD-10-CM | POA: Diagnosis not present

## 2020-01-30 DIAGNOSIS — N189 Chronic kidney disease, unspecified: Secondary | ICD-10-CM | POA: Diagnosis not present

## 2020-01-30 DIAGNOSIS — D631 Anemia in chronic kidney disease: Secondary | ICD-10-CM | POA: Diagnosis not present
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
## 2020-01-30 NOTE — Patient Instructions (Signed)
Medication Instructions:  Your physician has recommended you make the following change in your medication:  DECREASE: Gabapentin to 300 mg twice daily   *If you need a refill on your cardiac medications before your next appointment, please call your pharmacy*   Lab Work: None If you have labs (blood work) drawn today and your tests are completely normal, you will receive your results only by: Marland Kitchen MyChart Message (if you have MyChart) OR . A paper copy in the mail If you have any lab test that is abnormal or we need to change your treatment, we will call you to review the results.   Testing/Procedures: None   Follow-Up: At [facility], you and your health needs are our priority.  As part of our continuing mission to provide you with exceptional heart care, we have created designated Provider Care Teams.  These Care Teams include your primary Cardiologist (physician) and Advanced Practice Providers (APPs -  Physician Assistants and Nurse Practitioners) who all work together to provide you with the care you need, when you need it.  We recommend signing up for the patient portal called "MyChart".  Sign up information is provided on this After Visit Summary.  MyChart is used to connect with patients for Virtual Visits (Telemedicine).  Patients are able to view lab/test results, encounter notes, upcoming appointments, etc.  Non-urgent messages can be sent to your provider as well.   To learn more about what you can do with MyChart, go to [email or web address].    Your next appointment:   6 month(s)  The format for your next appointment:   In Person  Provider:   Norman Herrlich, MD   Other Instructions

## 2020-02-03 DIAGNOSIS — I509 Heart failure, unspecified: Secondary | ICD-10-CM | POA: Diagnosis not present

## 2020-02-03 DIAGNOSIS — D631 Anemia in chronic kidney disease: Secondary | ICD-10-CM | POA: Diagnosis not present

## 2020-02-03 DIAGNOSIS — R2243 Localized swelling, mass and lump, lower limb, bilateral: Secondary | ICD-10-CM | POA: Diagnosis not present

## 2020-02-03 DIAGNOSIS — M810 Age-related osteoporosis without current pathological fracture: Secondary | ICD-10-CM | POA: Diagnosis not present

## 2020-02-03 DIAGNOSIS — N189 Chronic kidney disease, unspecified: Secondary | ICD-10-CM | POA: Diagnosis not present

## 2020-02-03 DIAGNOSIS — E1122 Type 2 diabetes mellitus with diabetic chronic kidney disease: Secondary | ICD-10-CM | POA: Diagnosis not present

## 2020-02-04 DIAGNOSIS — R2243 Localized swelling, mass and lump, lower limb, bilateral: Secondary | ICD-10-CM | POA: Diagnosis not present

## 2020-02-04 DIAGNOSIS — D631 Anemia in chronic kidney disease: Secondary | ICD-10-CM | POA: Diagnosis not present

## 2020-02-04 DIAGNOSIS — I509 Heart failure, unspecified: Secondary | ICD-10-CM | POA: Diagnosis not present

## 2020-02-04 DIAGNOSIS — M810 Age-related osteoporosis without current pathological fracture: Secondary | ICD-10-CM | POA: Diagnosis not present

## 2020-02-04 DIAGNOSIS — N189 Chronic kidney disease, unspecified: Secondary | ICD-10-CM | POA: Diagnosis not present

## 2020-02-04 DIAGNOSIS — Z96641 Presence of right artificial hip joint: Secondary | ICD-10-CM | POA: Diagnosis not present

## 2020-02-04 DIAGNOSIS — H9190 Unspecified hearing loss, unspecified ear: Secondary | ICD-10-CM | POA: Diagnosis not present

## 2020-02-04 DIAGNOSIS — E1122 Type 2 diabetes mellitus with diabetic chronic kidney disease: Secondary | ICD-10-CM | POA: Diagnosis not present

## 2020-02-04 DIAGNOSIS — J45909 Unspecified asthma, uncomplicated: Secondary | ICD-10-CM | POA: Diagnosis not present

## 2020-02-04 DIAGNOSIS — I130 Hypertensive heart and chronic kidney disease with heart failure and stage 1 through stage 4 chronic kidney disease, or unspecified chronic kidney disease: Secondary | ICD-10-CM | POA: Diagnosis not present

## 2020-02-04 DIAGNOSIS — Z8673 Personal history of transient ischemic attack (TIA), and cerebral infarction without residual deficits: Secondary | ICD-10-CM | POA: Diagnosis not present

## 2020-02-04 DIAGNOSIS — Z7901 Long term (current) use of anticoagulants: Secondary | ICD-10-CM | POA: Diagnosis not present

## 2020-02-04 DIAGNOSIS — E1140 Type 2 diabetes mellitus with diabetic neuropathy, unspecified: Secondary | ICD-10-CM | POA: Diagnosis not present

## 2020-02-04 DIAGNOSIS — K5904 Chronic idiopathic constipation: Secondary | ICD-10-CM | POA: Diagnosis not present

## 2020-02-04 DIAGNOSIS — E785 Hyperlipidemia, unspecified: Secondary | ICD-10-CM | POA: Diagnosis not present

## 2020-02-04 DIAGNOSIS — K219 Gastro-esophageal reflux disease without esophagitis: Secondary | ICD-10-CM | POA: Diagnosis not present

## 2020-02-09 DIAGNOSIS — N189 Chronic kidney disease, unspecified: Secondary | ICD-10-CM | POA: Diagnosis not present

## 2020-02-09 DIAGNOSIS — I509 Heart failure, unspecified: Secondary | ICD-10-CM | POA: Diagnosis not present

## 2020-02-09 DIAGNOSIS — E1122 Type 2 diabetes mellitus with diabetic chronic kidney disease: Secondary | ICD-10-CM | POA: Diagnosis not present

## 2020-02-09 DIAGNOSIS — D631 Anemia in chronic kidney disease: Secondary | ICD-10-CM | POA: Diagnosis not present

## 2020-02-09 DIAGNOSIS — M810 Age-related osteoporosis without current pathological fracture: Secondary | ICD-10-CM | POA: Diagnosis not present

## 2020-02-09 DIAGNOSIS — R2243 Localized swelling, mass and lump, lower limb, bilateral: Secondary | ICD-10-CM | POA: Diagnosis not present

## 2020-02-10 DIAGNOSIS — Z794 Long term (current) use of insulin: Secondary | ICD-10-CM | POA: Diagnosis not present

## 2020-02-10 DIAGNOSIS — H35351 Cystoid macular degeneration, right eye: Secondary | ICD-10-CM | POA: Diagnosis not present

## 2020-02-10 DIAGNOSIS — Z7984 Long term (current) use of oral hypoglycemic drugs: Secondary | ICD-10-CM | POA: Diagnosis not present

## 2020-02-10 DIAGNOSIS — H25813 Combined forms of age-related cataract, bilateral: Secondary | ICD-10-CM | POA: Diagnosis not present

## 2020-02-10 DIAGNOSIS — H5203 Hypermetropia, bilateral: Secondary | ICD-10-CM | POA: Diagnosis not present

## 2020-02-10 DIAGNOSIS — H52223 Regular astigmatism, bilateral: Secondary | ICD-10-CM | POA: Diagnosis not present

## 2020-02-10 DIAGNOSIS — H524 Presbyopia: Secondary | ICD-10-CM | POA: Diagnosis not present

## 2020-02-10 DIAGNOSIS — E119 Type 2 diabetes mellitus without complications: Secondary | ICD-10-CM | POA: Diagnosis not present

## 2020-02-11 DIAGNOSIS — E1122 Type 2 diabetes mellitus with diabetic chronic kidney disease: Secondary | ICD-10-CM | POA: Diagnosis not present

## 2020-02-11 DIAGNOSIS — I509 Heart failure, unspecified: Secondary | ICD-10-CM | POA: Diagnosis not present

## 2020-02-11 DIAGNOSIS — M810 Age-related osteoporosis without current pathological fracture: Secondary | ICD-10-CM | POA: Diagnosis not present

## 2020-02-11 DIAGNOSIS — N189 Chronic kidney disease, unspecified: Secondary | ICD-10-CM | POA: Diagnosis not present

## 2020-02-11 DIAGNOSIS — R2243 Localized swelling, mass and lump, lower limb, bilateral: Secondary | ICD-10-CM | POA: Diagnosis not present

## 2020-02-11 DIAGNOSIS — D631 Anemia in chronic kidney disease: Secondary | ICD-10-CM | POA: Diagnosis not present

## 2020-02-16 DIAGNOSIS — D519 Vitamin B12 deficiency anemia, unspecified: Secondary | ICD-10-CM | POA: Diagnosis not present

## 2020-02-17 DIAGNOSIS — R2243 Localized swelling, mass and lump, lower limb, bilateral: Secondary | ICD-10-CM | POA: Diagnosis not present

## 2020-02-17 DIAGNOSIS — D631 Anemia in chronic kidney disease: Secondary | ICD-10-CM | POA: Diagnosis not present

## 2020-02-17 DIAGNOSIS — M810 Age-related osteoporosis without current pathological fracture: Secondary | ICD-10-CM | POA: Diagnosis not present

## 2020-02-17 DIAGNOSIS — N189 Chronic kidney disease, unspecified: Secondary | ICD-10-CM | POA: Diagnosis not present

## 2020-02-17 DIAGNOSIS — E1122 Type 2 diabetes mellitus with diabetic chronic kidney disease: Secondary | ICD-10-CM | POA: Diagnosis not present

## 2020-02-17 DIAGNOSIS — I509 Heart failure, unspecified: Secondary | ICD-10-CM | POA: Diagnosis not present

## 2020-02-26 DIAGNOSIS — R2243 Localized swelling, mass and lump, lower limb, bilateral: Secondary | ICD-10-CM | POA: Diagnosis not present

## 2020-02-26 DIAGNOSIS — N189 Chronic kidney disease, unspecified: Secondary | ICD-10-CM | POA: Diagnosis not present

## 2020-02-26 DIAGNOSIS — M810 Age-related osteoporosis without current pathological fracture: Secondary | ICD-10-CM | POA: Diagnosis not present

## 2020-02-26 DIAGNOSIS — I509 Heart failure, unspecified: Secondary | ICD-10-CM | POA: Diagnosis not present

## 2020-02-26 DIAGNOSIS — D631 Anemia in chronic kidney disease: Secondary | ICD-10-CM | POA: Diagnosis not present

## 2020-02-26 DIAGNOSIS — E1122 Type 2 diabetes mellitus with diabetic chronic kidney disease: Secondary | ICD-10-CM | POA: Diagnosis not present

## 2020-03-02 DIAGNOSIS — I509 Heart failure, unspecified: Secondary | ICD-10-CM | POA: Diagnosis not present

## 2020-03-02 DIAGNOSIS — R2243 Localized swelling, mass and lump, lower limb, bilateral: Secondary | ICD-10-CM | POA: Diagnosis not present

## 2020-03-02 DIAGNOSIS — N189 Chronic kidney disease, unspecified: Secondary | ICD-10-CM | POA: Diagnosis not present

## 2020-03-02 DIAGNOSIS — D631 Anemia in chronic kidney disease: Secondary | ICD-10-CM | POA: Diagnosis not present

## 2020-03-02 DIAGNOSIS — E1122 Type 2 diabetes mellitus with diabetic chronic kidney disease: Secondary | ICD-10-CM | POA: Diagnosis not present

## 2020-03-02 DIAGNOSIS — M810 Age-related osteoporosis without current pathological fracture: Secondary | ICD-10-CM | POA: Diagnosis not present

## 2020-03-05 DIAGNOSIS — K219 Gastro-esophageal reflux disease without esophagitis: Secondary | ICD-10-CM | POA: Diagnosis not present

## 2020-03-05 DIAGNOSIS — H9190 Unspecified hearing loss, unspecified ear: Secondary | ICD-10-CM | POA: Diagnosis not present

## 2020-03-05 DIAGNOSIS — E1122 Type 2 diabetes mellitus with diabetic chronic kidney disease: Secondary | ICD-10-CM | POA: Diagnosis not present

## 2020-03-05 DIAGNOSIS — R2243 Localized swelling, mass and lump, lower limb, bilateral: Secondary | ICD-10-CM | POA: Diagnosis not present

## 2020-03-05 DIAGNOSIS — E1140 Type 2 diabetes mellitus with diabetic neuropathy, unspecified: Secondary | ICD-10-CM | POA: Diagnosis not present

## 2020-03-05 DIAGNOSIS — Z96641 Presence of right artificial hip joint: Secondary | ICD-10-CM | POA: Diagnosis not present

## 2020-03-05 DIAGNOSIS — K5904 Chronic idiopathic constipation: Secondary | ICD-10-CM | POA: Diagnosis not present

## 2020-03-05 DIAGNOSIS — E785 Hyperlipidemia, unspecified: Secondary | ICD-10-CM | POA: Diagnosis not present

## 2020-03-05 DIAGNOSIS — N189 Chronic kidney disease, unspecified: Secondary | ICD-10-CM | POA: Diagnosis not present

## 2020-03-05 DIAGNOSIS — Z8673 Personal history of transient ischemic attack (TIA), and cerebral infarction without residual deficits: Secondary | ICD-10-CM | POA: Diagnosis not present

## 2020-03-05 DIAGNOSIS — I130 Hypertensive heart and chronic kidney disease with heart failure and stage 1 through stage 4 chronic kidney disease, or unspecified chronic kidney disease: Secondary | ICD-10-CM | POA: Diagnosis not present

## 2020-03-05 DIAGNOSIS — Z7901 Long term (current) use of anticoagulants: Secondary | ICD-10-CM | POA: Diagnosis not present

## 2020-03-05 DIAGNOSIS — I509 Heart failure, unspecified: Secondary | ICD-10-CM | POA: Diagnosis not present

## 2020-03-05 DIAGNOSIS — J45909 Unspecified asthma, uncomplicated: Secondary | ICD-10-CM | POA: Diagnosis not present

## 2020-03-05 DIAGNOSIS — M810 Age-related osteoporosis without current pathological fracture: Secondary | ICD-10-CM | POA: Diagnosis not present

## 2020-03-05 DIAGNOSIS — D631 Anemia in chronic kidney disease: Secondary | ICD-10-CM | POA: Diagnosis not present

## 2020-03-09 DIAGNOSIS — M79674 Pain in right toe(s): Secondary | ICD-10-CM | POA: Diagnosis not present

## 2020-03-09 DIAGNOSIS — B351 Tinea unguium: Secondary | ICD-10-CM | POA: Diagnosis not present

## 2020-03-16 DIAGNOSIS — I110 Hypertensive heart disease with heart failure: Secondary | ICD-10-CM | POA: Diagnosis not present

## 2020-03-16 DIAGNOSIS — D519 Vitamin B12 deficiency anemia, unspecified: Secondary | ICD-10-CM | POA: Diagnosis not present

## 2020-03-16 DIAGNOSIS — E1142 Type 2 diabetes mellitus with diabetic polyneuropathy: Secondary | ICD-10-CM | POA: Diagnosis not present

## 2020-03-16 DIAGNOSIS — Z713 Dietary counseling and surveillance: Secondary | ICD-10-CM | POA: Diagnosis not present

## 2020-03-16 DIAGNOSIS — E1165 Type 2 diabetes mellitus with hyperglycemia: Secondary | ICD-10-CM | POA: Diagnosis not present

## 2020-03-16 DIAGNOSIS — I5032 Chronic diastolic (congestive) heart failure: Secondary | ICD-10-CM | POA: Diagnosis not present

## 2020-03-16 DIAGNOSIS — I5030 Unspecified diastolic (congestive) heart failure: Secondary | ICD-10-CM | POA: Diagnosis not present

## 2020-03-16 DIAGNOSIS — E785 Hyperlipidemia, unspecified: Secondary | ICD-10-CM | POA: Diagnosis not present

## 2020-03-16 DIAGNOSIS — I6990 Unspecified sequelae of unspecified cerebrovascular disease: Secondary | ICD-10-CM | POA: Diagnosis not present

## 2020-03-18 DIAGNOSIS — D519 Vitamin B12 deficiency anemia, unspecified: Secondary | ICD-10-CM | POA: Diagnosis not present

## 2020-03-19 DIAGNOSIS — D631 Anemia in chronic kidney disease: Secondary | ICD-10-CM | POA: Diagnosis not present

## 2020-03-19 DIAGNOSIS — I509 Heart failure, unspecified: Secondary | ICD-10-CM | POA: Diagnosis not present

## 2020-03-19 DIAGNOSIS — N189 Chronic kidney disease, unspecified: Secondary | ICD-10-CM | POA: Diagnosis not present

## 2020-03-19 DIAGNOSIS — E1122 Type 2 diabetes mellitus with diabetic chronic kidney disease: Secondary | ICD-10-CM | POA: Diagnosis not present

## 2020-03-19 DIAGNOSIS — R2243 Localized swelling, mass and lump, lower limb, bilateral: Secondary | ICD-10-CM | POA: Diagnosis not present

## 2020-03-19 DIAGNOSIS — M810 Age-related osteoporosis without current pathological fracture: Secondary | ICD-10-CM | POA: Diagnosis not present

## 2020-03-23 DIAGNOSIS — I509 Heart failure, unspecified: Secondary | ICD-10-CM | POA: Diagnosis not present

## 2020-03-23 DIAGNOSIS — N189 Chronic kidney disease, unspecified: Secondary | ICD-10-CM | POA: Diagnosis not present

## 2020-03-23 DIAGNOSIS — E1122 Type 2 diabetes mellitus with diabetic chronic kidney disease: Secondary | ICD-10-CM | POA: Diagnosis not present

## 2020-03-23 DIAGNOSIS — R2243 Localized swelling, mass and lump, lower limb, bilateral: Secondary | ICD-10-CM | POA: Diagnosis not present

## 2020-03-23 DIAGNOSIS — M810 Age-related osteoporosis without current pathological fracture: Secondary | ICD-10-CM | POA: Diagnosis not present

## 2020-03-23 DIAGNOSIS — D631 Anemia in chronic kidney disease: Secondary | ICD-10-CM | POA: Diagnosis not present

## 2020-03-29 DIAGNOSIS — E1165 Type 2 diabetes mellitus with hyperglycemia: Secondary | ICD-10-CM | POA: Diagnosis not present

## 2020-03-29 DIAGNOSIS — E1142 Type 2 diabetes mellitus with diabetic polyneuropathy: Secondary | ICD-10-CM | POA: Diagnosis not present

## 2020-03-29 DIAGNOSIS — Z713 Dietary counseling and surveillance: Secondary | ICD-10-CM | POA: Diagnosis not present

## 2020-03-31 DIAGNOSIS — N189 Chronic kidney disease, unspecified: Secondary | ICD-10-CM | POA: Diagnosis not present

## 2020-03-31 DIAGNOSIS — M810 Age-related osteoporosis without current pathological fracture: Secondary | ICD-10-CM | POA: Diagnosis not present

## 2020-03-31 DIAGNOSIS — D631 Anemia in chronic kidney disease: Secondary | ICD-10-CM | POA: Diagnosis not present

## 2020-03-31 DIAGNOSIS — R2243 Localized swelling, mass and lump, lower limb, bilateral: Secondary | ICD-10-CM | POA: Diagnosis not present

## 2020-03-31 DIAGNOSIS — E1122 Type 2 diabetes mellitus with diabetic chronic kidney disease: Secondary | ICD-10-CM | POA: Diagnosis not present

## 2020-03-31 DIAGNOSIS — I509 Heart failure, unspecified: Secondary | ICD-10-CM | POA: Diagnosis not present

## 2020-04-04 DIAGNOSIS — Z96641 Presence of right artificial hip joint: Secondary | ICD-10-CM | POA: Diagnosis not present

## 2020-04-04 DIAGNOSIS — R2243 Localized swelling, mass and lump, lower limb, bilateral: Secondary | ICD-10-CM | POA: Diagnosis not present

## 2020-04-04 DIAGNOSIS — K219 Gastro-esophageal reflux disease without esophagitis: Secondary | ICD-10-CM | POA: Diagnosis not present

## 2020-04-04 DIAGNOSIS — Z7901 Long term (current) use of anticoagulants: Secondary | ICD-10-CM | POA: Diagnosis not present

## 2020-04-04 DIAGNOSIS — K5904 Chronic idiopathic constipation: Secondary | ICD-10-CM | POA: Diagnosis not present

## 2020-04-04 DIAGNOSIS — Z8673 Personal history of transient ischemic attack (TIA), and cerebral infarction without residual deficits: Secondary | ICD-10-CM | POA: Diagnosis not present

## 2020-04-04 DIAGNOSIS — N189 Chronic kidney disease, unspecified: Secondary | ICD-10-CM | POA: Diagnosis not present

## 2020-04-04 DIAGNOSIS — E785 Hyperlipidemia, unspecified: Secondary | ICD-10-CM | POA: Diagnosis not present

## 2020-04-04 DIAGNOSIS — J45909 Unspecified asthma, uncomplicated: Secondary | ICD-10-CM | POA: Diagnosis not present

## 2020-04-04 DIAGNOSIS — E1122 Type 2 diabetes mellitus with diabetic chronic kidney disease: Secondary | ICD-10-CM | POA: Diagnosis not present

## 2020-04-04 DIAGNOSIS — M810 Age-related osteoporosis without current pathological fracture: Secondary | ICD-10-CM | POA: Diagnosis not present

## 2020-04-04 DIAGNOSIS — D631 Anemia in chronic kidney disease: Secondary | ICD-10-CM | POA: Diagnosis not present

## 2020-04-04 DIAGNOSIS — I130 Hypertensive heart and chronic kidney disease with heart failure and stage 1 through stage 4 chronic kidney disease, or unspecified chronic kidney disease: Secondary | ICD-10-CM | POA: Diagnosis not present

## 2020-04-04 DIAGNOSIS — H9190 Unspecified hearing loss, unspecified ear: Secondary | ICD-10-CM | POA: Diagnosis not present

## 2020-04-04 DIAGNOSIS — E1140 Type 2 diabetes mellitus with diabetic neuropathy, unspecified: Secondary | ICD-10-CM | POA: Diagnosis not present

## 2020-04-04 DIAGNOSIS — I509 Heart failure, unspecified: Secondary | ICD-10-CM | POA: Diagnosis not present

## 2020-04-06 DIAGNOSIS — N189 Chronic kidney disease, unspecified: Secondary | ICD-10-CM | POA: Diagnosis not present

## 2020-04-06 DIAGNOSIS — M810 Age-related osteoporosis without current pathological fracture: Secondary | ICD-10-CM | POA: Diagnosis not present

## 2020-04-06 DIAGNOSIS — I509 Heart failure, unspecified: Secondary | ICD-10-CM | POA: Diagnosis not present

## 2020-04-06 DIAGNOSIS — D631 Anemia in chronic kidney disease: Secondary | ICD-10-CM | POA: Diagnosis not present

## 2020-04-06 DIAGNOSIS — R2243 Localized swelling, mass and lump, lower limb, bilateral: Secondary | ICD-10-CM | POA: Diagnosis not present

## 2020-04-06 DIAGNOSIS — E1122 Type 2 diabetes mellitus with diabetic chronic kidney disease: Secondary | ICD-10-CM | POA: Diagnosis not present

## 2020-04-14 DIAGNOSIS — I509 Heart failure, unspecified: Secondary | ICD-10-CM | POA: Diagnosis not present

## 2020-04-14 DIAGNOSIS — N189 Chronic kidney disease, unspecified: Secondary | ICD-10-CM | POA: Diagnosis not present

## 2020-04-14 DIAGNOSIS — D631 Anemia in chronic kidney disease: Secondary | ICD-10-CM | POA: Diagnosis not present

## 2020-04-14 DIAGNOSIS — M810 Age-related osteoporosis without current pathological fracture: Secondary | ICD-10-CM | POA: Diagnosis not present

## 2020-04-14 DIAGNOSIS — R2243 Localized swelling, mass and lump, lower limb, bilateral: Secondary | ICD-10-CM | POA: Diagnosis not present

## 2020-04-14 DIAGNOSIS — E1122 Type 2 diabetes mellitus with diabetic chronic kidney disease: Secondary | ICD-10-CM | POA: Diagnosis not present

## 2020-04-16 DIAGNOSIS — M810 Age-related osteoporosis without current pathological fracture: Secondary | ICD-10-CM | POA: Diagnosis not present

## 2020-04-16 DIAGNOSIS — R2243 Localized swelling, mass and lump, lower limb, bilateral: Secondary | ICD-10-CM | POA: Diagnosis not present

## 2020-04-16 DIAGNOSIS — I509 Heart failure, unspecified: Secondary | ICD-10-CM | POA: Diagnosis not present

## 2020-04-16 DIAGNOSIS — D631 Anemia in chronic kidney disease: Secondary | ICD-10-CM | POA: Diagnosis not present

## 2020-04-16 DIAGNOSIS — N189 Chronic kidney disease, unspecified: Secondary | ICD-10-CM | POA: Diagnosis not present

## 2020-04-16 DIAGNOSIS — E1122 Type 2 diabetes mellitus with diabetic chronic kidney disease: Secondary | ICD-10-CM | POA: Diagnosis not present

## 2020-04-19 DIAGNOSIS — D519 Vitamin B12 deficiency anemia, unspecified: Secondary | ICD-10-CM | POA: Diagnosis not present

## 2020-04-20 DIAGNOSIS — R2243 Localized swelling, mass and lump, lower limb, bilateral: Secondary | ICD-10-CM | POA: Diagnosis not present

## 2020-04-20 DIAGNOSIS — D631 Anemia in chronic kidney disease: Secondary | ICD-10-CM | POA: Diagnosis not present

## 2020-04-20 DIAGNOSIS — E1122 Type 2 diabetes mellitus with diabetic chronic kidney disease: Secondary | ICD-10-CM | POA: Diagnosis not present

## 2020-04-20 DIAGNOSIS — M810 Age-related osteoporosis without current pathological fracture: Secondary | ICD-10-CM | POA: Diagnosis not present

## 2020-04-20 DIAGNOSIS — I509 Heart failure, unspecified: Secondary | ICD-10-CM | POA: Diagnosis not present

## 2020-04-20 DIAGNOSIS — N189 Chronic kidney disease, unspecified: Secondary | ICD-10-CM | POA: Diagnosis not present

## 2020-04-27 DIAGNOSIS — N189 Chronic kidney disease, unspecified: Secondary | ICD-10-CM | POA: Diagnosis not present

## 2020-04-27 DIAGNOSIS — I509 Heart failure, unspecified: Secondary | ICD-10-CM | POA: Diagnosis not present

## 2020-04-27 DIAGNOSIS — R2243 Localized swelling, mass and lump, lower limb, bilateral: Secondary | ICD-10-CM | POA: Diagnosis not present

## 2020-04-27 DIAGNOSIS — E1122 Type 2 diabetes mellitus with diabetic chronic kidney disease: Secondary | ICD-10-CM | POA: Diagnosis not present

## 2020-04-27 DIAGNOSIS — D631 Anemia in chronic kidney disease: Secondary | ICD-10-CM | POA: Diagnosis not present

## 2020-04-27 DIAGNOSIS — M810 Age-related osteoporosis without current pathological fracture: Secondary | ICD-10-CM | POA: Diagnosis not present

## 2020-05-03 DIAGNOSIS — E1122 Type 2 diabetes mellitus with diabetic chronic kidney disease: Secondary | ICD-10-CM | POA: Diagnosis not present

## 2020-05-03 DIAGNOSIS — M810 Age-related osteoporosis without current pathological fracture: Secondary | ICD-10-CM | POA: Diagnosis not present

## 2020-05-03 DIAGNOSIS — N189 Chronic kidney disease, unspecified: Secondary | ICD-10-CM | POA: Diagnosis not present

## 2020-05-03 DIAGNOSIS — R2243 Localized swelling, mass and lump, lower limb, bilateral: Secondary | ICD-10-CM | POA: Diagnosis not present

## 2020-05-03 DIAGNOSIS — I509 Heart failure, unspecified: Secondary | ICD-10-CM | POA: Diagnosis not present

## 2020-05-03 DIAGNOSIS — D631 Anemia in chronic kidney disease: Secondary | ICD-10-CM | POA: Diagnosis not present

## 2020-05-04 DIAGNOSIS — K219 Gastro-esophageal reflux disease without esophagitis: Secondary | ICD-10-CM | POA: Diagnosis not present

## 2020-05-04 DIAGNOSIS — M810 Age-related osteoporosis without current pathological fracture: Secondary | ICD-10-CM | POA: Diagnosis not present

## 2020-05-04 DIAGNOSIS — N189 Chronic kidney disease, unspecified: Secondary | ICD-10-CM | POA: Diagnosis not present

## 2020-05-04 DIAGNOSIS — E785 Hyperlipidemia, unspecified: Secondary | ICD-10-CM | POA: Diagnosis not present

## 2020-05-04 DIAGNOSIS — E1140 Type 2 diabetes mellitus with diabetic neuropathy, unspecified: Secondary | ICD-10-CM | POA: Diagnosis not present

## 2020-05-04 DIAGNOSIS — R2243 Localized swelling, mass and lump, lower limb, bilateral: Secondary | ICD-10-CM | POA: Diagnosis not present

## 2020-05-04 DIAGNOSIS — D631 Anemia in chronic kidney disease: Secondary | ICD-10-CM | POA: Diagnosis not present

## 2020-05-04 DIAGNOSIS — I509 Heart failure, unspecified: Secondary | ICD-10-CM | POA: Diagnosis not present

## 2020-05-04 DIAGNOSIS — Z96641 Presence of right artificial hip joint: Secondary | ICD-10-CM | POA: Diagnosis not present

## 2020-05-04 DIAGNOSIS — Z7901 Long term (current) use of anticoagulants: Secondary | ICD-10-CM | POA: Diagnosis not present

## 2020-05-04 DIAGNOSIS — E1122 Type 2 diabetes mellitus with diabetic chronic kidney disease: Secondary | ICD-10-CM | POA: Diagnosis not present

## 2020-05-04 DIAGNOSIS — J45909 Unspecified asthma, uncomplicated: Secondary | ICD-10-CM | POA: Diagnosis not present

## 2020-05-04 DIAGNOSIS — Z8673 Personal history of transient ischemic attack (TIA), and cerebral infarction without residual deficits: Secondary | ICD-10-CM | POA: Diagnosis not present

## 2020-05-04 DIAGNOSIS — H9190 Unspecified hearing loss, unspecified ear: Secondary | ICD-10-CM | POA: Diagnosis not present

## 2020-05-04 DIAGNOSIS — K5904 Chronic idiopathic constipation: Secondary | ICD-10-CM | POA: Diagnosis not present

## 2020-05-04 DIAGNOSIS — I130 Hypertensive heart and chronic kidney disease with heart failure and stage 1 through stage 4 chronic kidney disease, or unspecified chronic kidney disease: Secondary | ICD-10-CM | POA: Diagnosis not present

## 2020-05-10 DIAGNOSIS — N189 Chronic kidney disease, unspecified: Secondary | ICD-10-CM | POA: Diagnosis not present

## 2020-05-10 DIAGNOSIS — I509 Heart failure, unspecified: Secondary | ICD-10-CM | POA: Diagnosis not present

## 2020-05-10 DIAGNOSIS — R2243 Localized swelling, mass and lump, lower limb, bilateral: Secondary | ICD-10-CM | POA: Diagnosis not present

## 2020-05-10 DIAGNOSIS — D631 Anemia in chronic kidney disease: Secondary | ICD-10-CM | POA: Diagnosis not present

## 2020-05-10 DIAGNOSIS — M810 Age-related osteoporosis without current pathological fracture: Secondary | ICD-10-CM | POA: Diagnosis not present

## 2020-05-10 DIAGNOSIS — E1122 Type 2 diabetes mellitus with diabetic chronic kidney disease: Secondary | ICD-10-CM | POA: Diagnosis not present

## 2020-05-13 IMAGING — CT CT HEAD WITHOUT CONTRAST
4 series · 16 of 47 positions shown, 18 images · non-contrast
Comparison: Prior MRI from 11/16/2018.

CLINICAL DATA: Follow-up examination for acute stroke.

EXAM:
CT HEAD WITHOUT CONTRAST
TECHNIQUE: Contiguous axial images were obtained from the base of the skull
through the vertex without intravenous contrast.

[Series 3: head wo · axial · 0.42mm/px · z∈[-188,-68]mm · 7 of 33 slices shown, 9 images]
[im 5/33  brain]
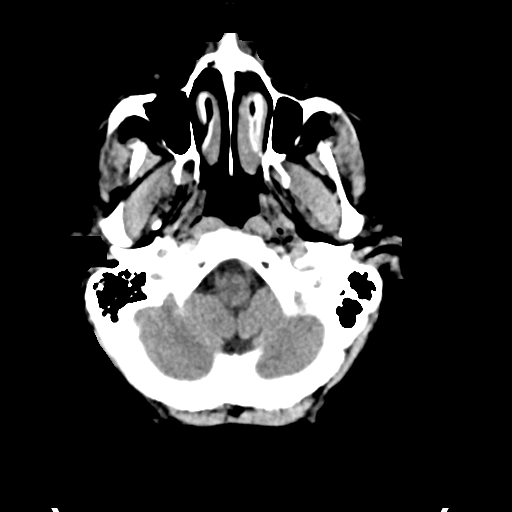
[im 5/33  bone]
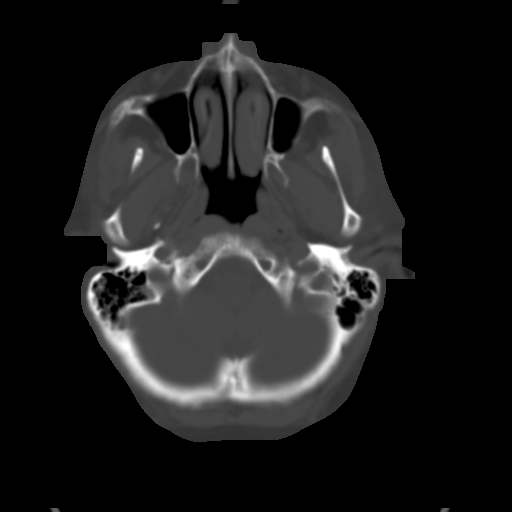
[im 9/33  brain]
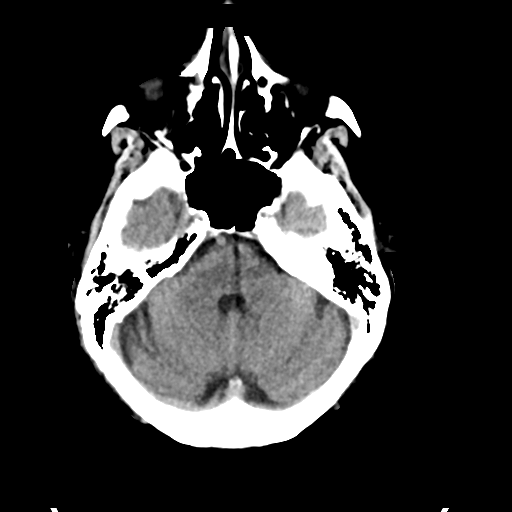
[im 13/33  brain]
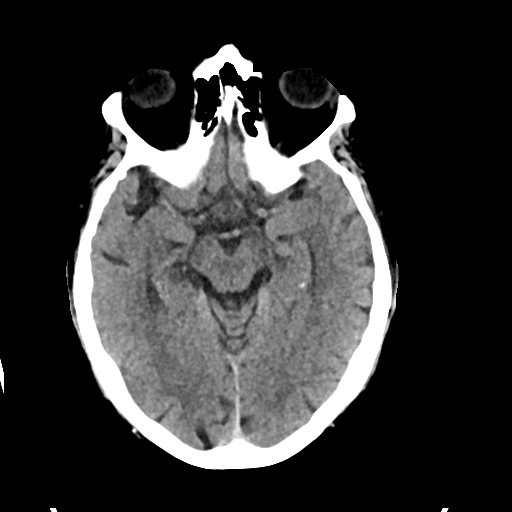
[im 17/33  brain]
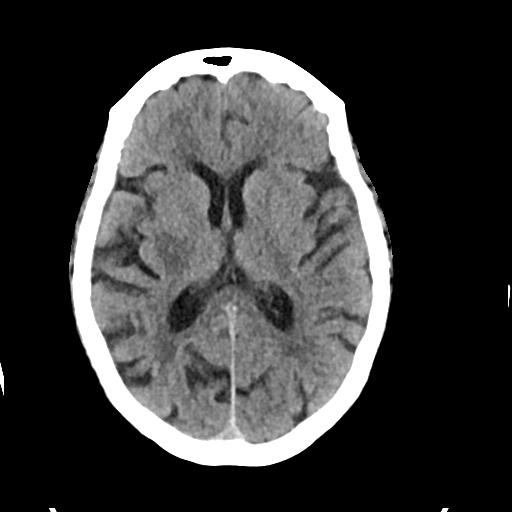
[im 21/33  brain]
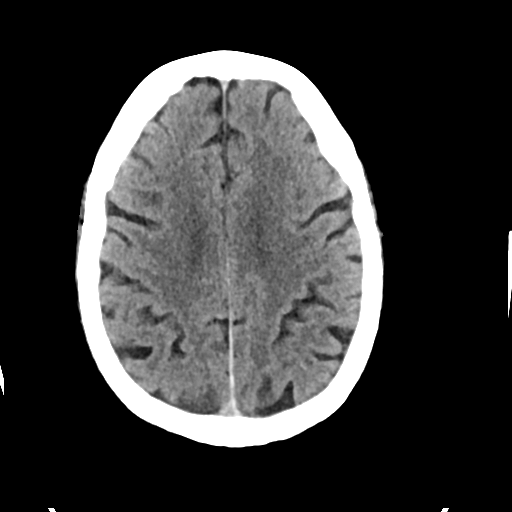
[im 21/33  bone]
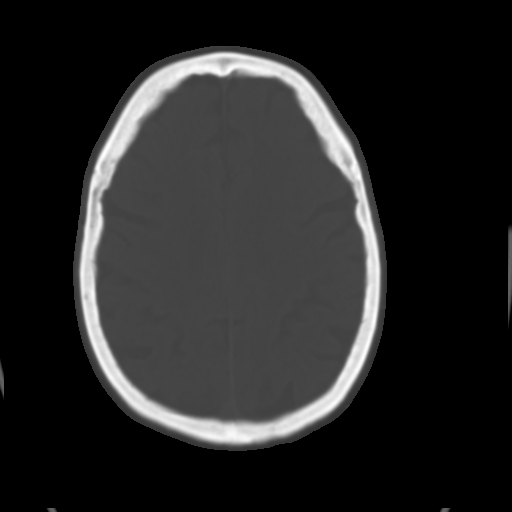
[im 25/33  brain]
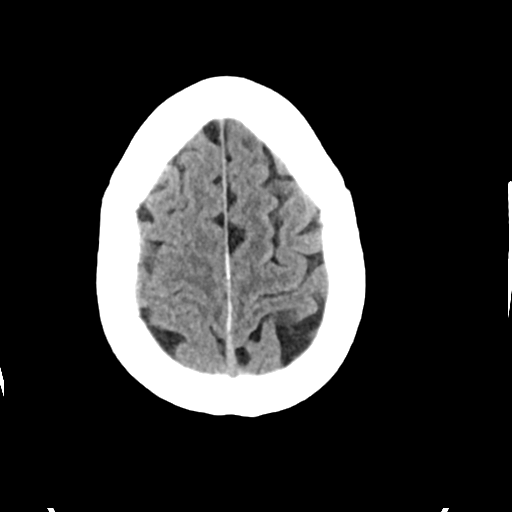
[im 29/33  brain]
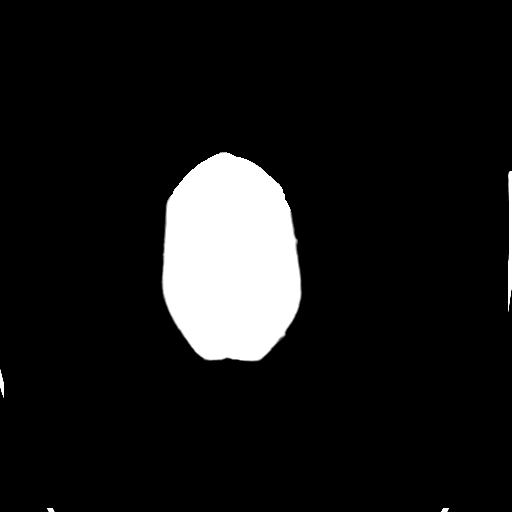

[Series 4: head bone · axial · 0.42mm/px · z∈[-192,-160]mm · 3 of 82 slices shown]
[im 9/82  bone]
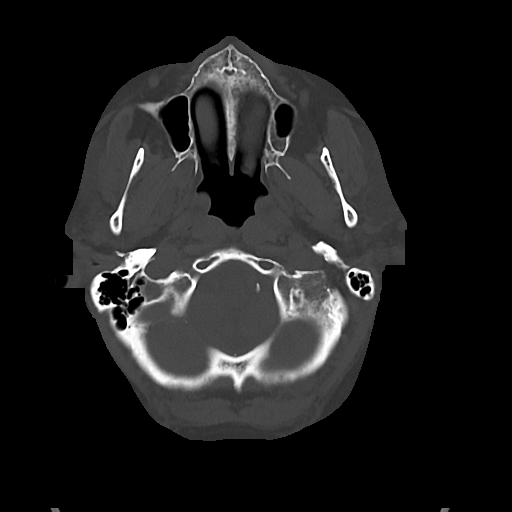
[im 17/82  bone]
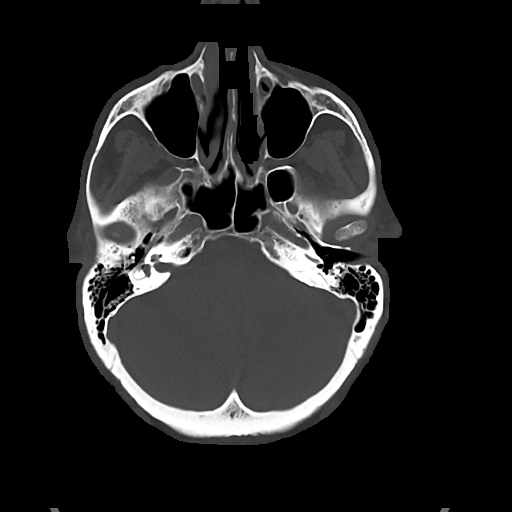
[im 25/82  bone]
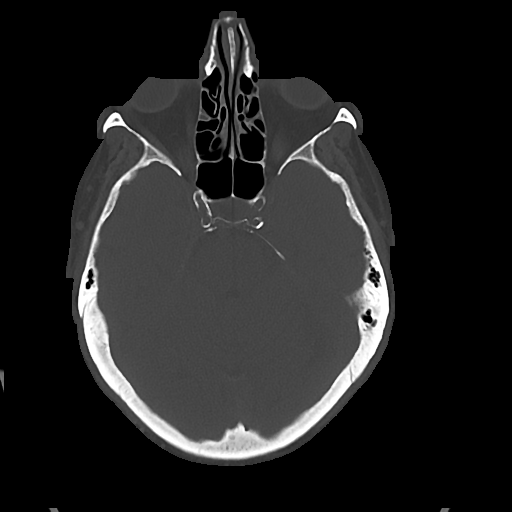

[Series 5: cor soft · coronal · 0.32mm/px · 3 of 67 slices shown]
[im 23/67  brain]
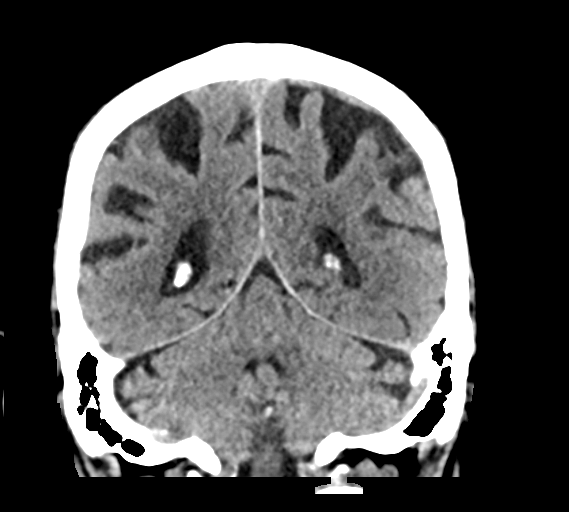
[im 30/67  brain]
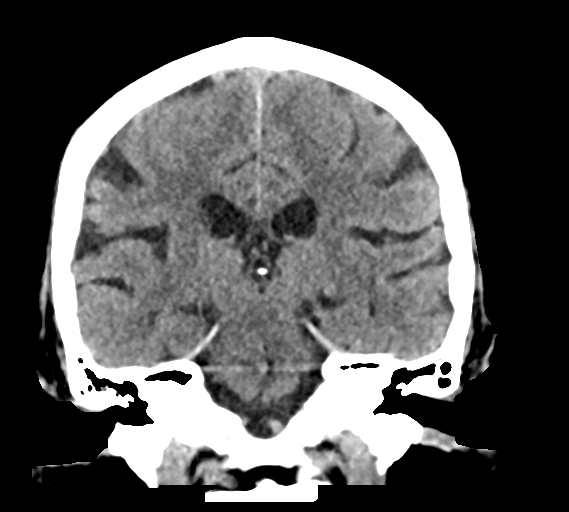
[im 37/67  brain]
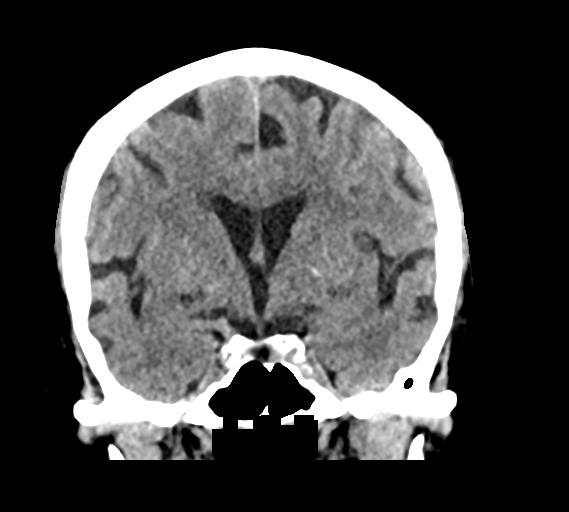

[Series 6: sag soft · sagittal · 0.32mm/px · 3 of 58 slices shown]
[im 20/58  brain]
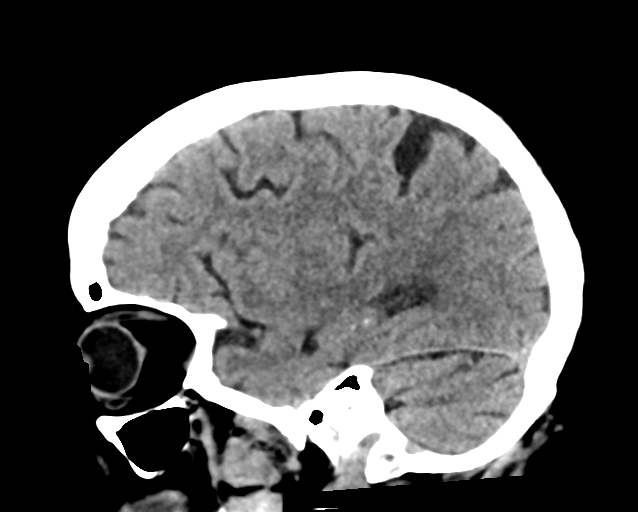
[im 29/58  brain]
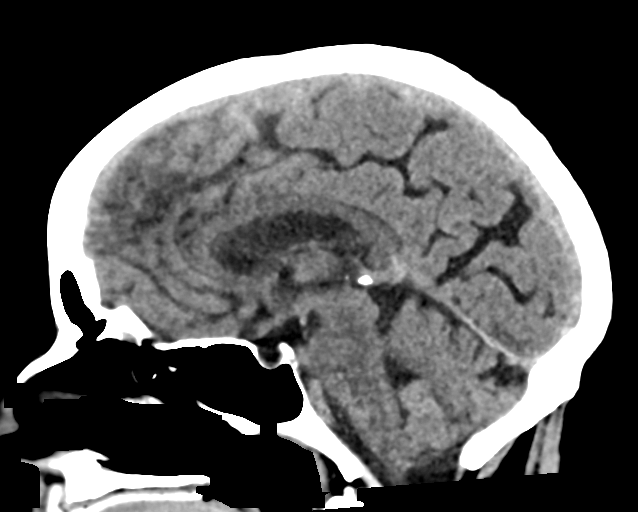
[im 39/58  brain]
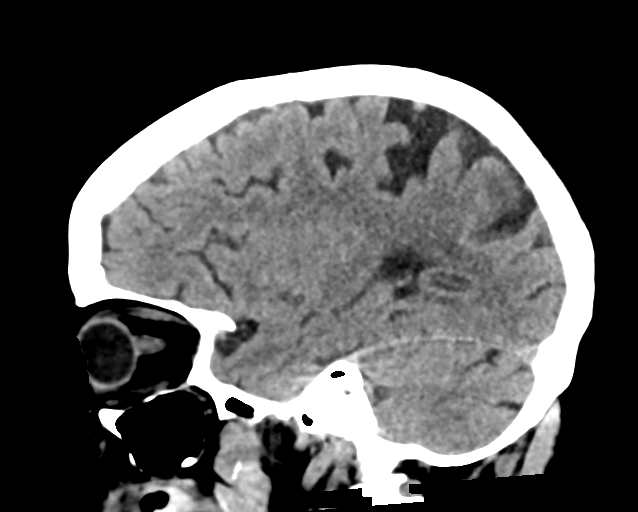

[16 of 47 positions shown; findings below may reference images not displayed]

FINDINGS: Brain: Continued normal interval evolution of previously identified
right basal ganglia perforator type infarct. Overall, infarct is
stable in size and morphology from previous. Faint hyperdensity
within the area of infarction consistent with previously identified
petechial hemorrhage. No frank hemorrhagic transformation by CT.

No other new acute large vessel territory infarct. No other
hemorrhage. No mass lesion, midline shift or mass effect. No
hydrocephalus. No extra-axial fluid collection. Atrophy with chronic
microvascular ischemic disease again noted.

Vascular: No hyperdense vessel. Calcified atherosclerosis at the
skull base.

Skull: Scalp soft tissues and calvarium within normal limits.

Sinuses/Orbits: Globes and orbital soft tissues normal. Paranasal
sinuses are clear. No mastoid effusion.

Other: None.
IMPRESSION: 1. Continued normal interval evolution of acute right basal ganglia
infarct, stable in size morphology from previous. Associated faint
petechial hemorrhage without evidence for hemorrhagic
transformation, stable.
2. No other new acute intracranial abnormality.

## 2020-05-14 DIAGNOSIS — R2243 Localized swelling, mass and lump, lower limb, bilateral: Secondary | ICD-10-CM | POA: Diagnosis not present

## 2020-05-14 DIAGNOSIS — E1122 Type 2 diabetes mellitus with diabetic chronic kidney disease: Secondary | ICD-10-CM | POA: Diagnosis not present

## 2020-05-14 DIAGNOSIS — I509 Heart failure, unspecified: Secondary | ICD-10-CM | POA: Diagnosis not present

## 2020-05-14 DIAGNOSIS — N189 Chronic kidney disease, unspecified: Secondary | ICD-10-CM | POA: Diagnosis not present

## 2020-05-14 DIAGNOSIS — D631 Anemia in chronic kidney disease: Secondary | ICD-10-CM | POA: Diagnosis not present

## 2020-05-14 DIAGNOSIS — M810 Age-related osteoporosis without current pathological fracture: Secondary | ICD-10-CM | POA: Diagnosis not present

## 2020-05-17 ENCOUNTER — Other Ambulatory Visit: Payer: Self-pay

## 2020-05-17 ENCOUNTER — Emergency Department (HOSPITAL_COMMUNITY)
Admission: EM | Admit: 2020-05-17 | Discharge: 2020-05-18 | Disposition: A | Discharge status: Home / Self Care | Payer: Medicare Other | Attending: Emergency Medicine | Admitting: Emergency Medicine

## 2020-05-17 ENCOUNTER — Encounter (HOSPITAL_COMMUNITY): Payer: Self-pay

## 2020-05-17 ENCOUNTER — Emergency Department (HOSPITAL_COMMUNITY): Payer: Medicare Other

## 2020-05-17 DIAGNOSIS — I491 Atrial premature depolarization: Secondary | ICD-10-CM | POA: Diagnosis not present

## 2020-05-17 DIAGNOSIS — R519 Headache, unspecified: Secondary | ICD-10-CM | POA: Diagnosis not present

## 2020-05-17 DIAGNOSIS — I110 Hypertensive heart disease with heart failure: Secondary | ICD-10-CM | POA: Insufficient documentation

## 2020-05-17 DIAGNOSIS — I509 Heart failure, unspecified: Secondary | ICD-10-CM | POA: Diagnosis not present

## 2020-05-17 DIAGNOSIS — M810 Age-related osteoporosis without current pathological fracture: Secondary | ICD-10-CM | POA: Diagnosis not present

## 2020-05-17 DIAGNOSIS — J449 Chronic obstructive pulmonary disease, unspecified: Secondary | ICD-10-CM | POA: Diagnosis not present

## 2020-05-17 DIAGNOSIS — Z79899 Other long term (current) drug therapy: Secondary | ICD-10-CM | POA: Insufficient documentation

## 2020-05-17 DIAGNOSIS — R471 Dysarthria and anarthria: Secondary | ICD-10-CM | POA: Insufficient documentation

## 2020-05-17 DIAGNOSIS — R202 Paresthesia of skin: Secondary | ICD-10-CM | POA: Diagnosis not present

## 2020-05-17 DIAGNOSIS — Z8673 Personal history of transient ischemic attack (TIA), and cerebral infarction without residual deficits: Secondary | ICD-10-CM | POA: Diagnosis not present

## 2020-05-17 DIAGNOSIS — I619 Nontraumatic intracerebral hemorrhage, unspecified: Secondary | ICD-10-CM | POA: Diagnosis not present

## 2020-05-17 DIAGNOSIS — E1165 Type 2 diabetes mellitus with hyperglycemia: Secondary | ICD-10-CM | POA: Diagnosis not present

## 2020-05-17 DIAGNOSIS — G454 Transient global amnesia: Secondary | ICD-10-CM | POA: Diagnosis not present

## 2020-05-17 DIAGNOSIS — R4781 Slurred speech: Secondary | ICD-10-CM | POA: Diagnosis not present

## 2020-05-17 DIAGNOSIS — I10 Essential (primary) hypertension: Secondary | ICD-10-CM | POA: Diagnosis not present

## 2020-05-17 DIAGNOSIS — Z794 Long term (current) use of insulin: Secondary | ICD-10-CM | POA: Insufficient documentation

## 2020-05-17 DIAGNOSIS — R2243 Localized swelling, mass and lump, lower limb, bilateral: Secondary | ICD-10-CM | POA: Diagnosis not present

## 2020-05-17 DIAGNOSIS — E1142 Type 2 diabetes mellitus with diabetic polyneuropathy: Secondary | ICD-10-CM | POA: Insufficient documentation

## 2020-05-17 DIAGNOSIS — I959 Hypotension, unspecified: Secondary | ICD-10-CM | POA: Diagnosis not present

## 2020-05-17 DIAGNOSIS — Z7984 Long term (current) use of oral hypoglycemic drugs: Secondary | ICD-10-CM | POA: Insufficient documentation

## 2020-05-17 DIAGNOSIS — R2990 Unspecified symptoms and signs involving the nervous system: Secondary | ICD-10-CM | POA: Diagnosis not present

## 2020-05-17 DIAGNOSIS — N189 Chronic kidney disease, unspecified: Secondary | ICD-10-CM | POA: Diagnosis not present

## 2020-05-17 DIAGNOSIS — E1122 Type 2 diabetes mellitus with diabetic chronic kidney disease: Secondary | ICD-10-CM | POA: Diagnosis not present

## 2020-05-17 DIAGNOSIS — G459 Transient cerebral ischemic attack, unspecified: Secondary | ICD-10-CM | POA: Diagnosis not present

## 2020-05-17 DIAGNOSIS — D631 Anemia in chronic kidney disease: Secondary | ICD-10-CM | POA: Diagnosis not present

## 2020-05-17 DIAGNOSIS — R4702 Dysphasia: Secondary | ICD-10-CM | POA: Diagnosis not present

## 2020-05-17 DIAGNOSIS — R52 Pain, unspecified: Secondary | ICD-10-CM | POA: Diagnosis not present

## 2020-05-17 LAB — COMPREHENSIVE METABOLIC PANEL
ALT: 17 U/L (ref 0–44)
AST: 28 U/L (ref 15–41)
Albumin: 3.7 g/dL (ref 3.5–5.0)
Alkaline Phosphatase: 99 U/L (ref 38–126)
Anion gap: 13 (ref 5–15)
BUN: 16 mg/dL (ref 8–23)
CO2: 23 mmol/L (ref 22–32)
Calcium: 9.1 mg/dL (ref 8.9–10.3)
Chloride: 100 mmol/L (ref 98–111)
Creatinine, Ser: 0.85 mg/dL (ref 0.44–1.00)
GFR, Estimated: >60 mL/min (ref >60)
Glucose, Bld: 169 mg/dL — ABNORMAL HIGH (ref 70–99)
Potassium: 3.3 mmol/L — ABNORMAL LOW (ref 3.5–5.1)
Sodium: 136 mmol/L (ref 135–145)
Total Bilirubin: 0.7 mg/dL (ref 0.3–1.2)
Total Protein: 6.9 g/dL (ref 6.5–8.1)

## 2020-05-17 LAB — I-STAT CHEM 8, ED
BUN: 21 mg/dL (ref 8–23)
Calcium, Ion: 1.17 mmol/L (ref 1.15–1.40)
Chloride: 102 mmol/L (ref 98–111)
Creatinine, Ser: 0.7 mg/dL (ref 0.44–1.00)
Glucose, Bld: 173 mg/dL — ABNORMAL HIGH (ref 70–99)
HCT: 41 % (ref 36.0–46.0)
Hemoglobin: 13.9 g/dL (ref 12.0–15.0)
Potassium: 3.4 mmol/L — ABNORMAL LOW (ref 3.5–5.1)
Sodium: 140 mmol/L (ref 135–145)
TCO2: 28 mmol/L (ref 22–32)

## 2020-05-17 LAB — CBC
HCT: 40.7 % (ref 36.0–46.0)
Hemoglobin: 13 g/dL (ref 12.0–15.0)
MCH: 26.9 pg (ref 26.0–34.0)
MCHC: 31.9 g/dL (ref 30.0–36.0)
MCV: 84.3 fL (ref 80.0–100.0)
Platelets: 269 10*3/uL (ref 150–400)
RBC: 4.83 MIL/uL (ref 3.87–5.11)
RDW: 14.5 % (ref 11.5–15.5)
WBC: 13.2 10*3/uL — ABNORMAL HIGH (ref 4.0–10.5)
nRBC: 0 % (ref 0.0–0.2)

## 2020-05-17 LAB — DIFFERENTIAL
Abs Immature Granulocytes: 0.06 10*3/uL (ref 0.00–0.07)
Basophils Absolute: 0 10*3/uL (ref 0.0–0.1)
Basophils Relative: 0 %
Eosinophils Absolute: 0.1 10*3/uL (ref 0.0–0.5)
Eosinophils Relative: 1 %
Immature Granulocytes: 1 %
Lymphocytes Relative: 14 %
Lymphs Abs: 1.8 10*3/uL (ref 0.7–4.0)
Monocytes Absolute: 1 10*3/uL (ref 0.1–1.0)
Monocytes Relative: 7 %
Neutro Abs: 10.2 10*3/uL — ABNORMAL HIGH (ref 1.7–7.7)
Neutrophils Relative %: 77 %

## 2020-05-17 LAB — PROTIME-INR
INR: 1 (ref 0.8–1.2)
Prothrombin Time: 12.7 seconds (ref 11.4–15.2)

## 2020-05-17 LAB — APTT: aPTT: 25 seconds (ref 24–36)

## 2020-05-17 LAB — CBG MONITORING, ED: Glucose-Capillary: 177 mg/dL — ABNORMAL HIGH (ref 70–99)

## 2020-05-17 MED ORDER — SODIUM CHLORIDE 0.9% FLUSH
3.0000 mL | Freq: Once | INTRAVENOUS | Status: AC
  Administered 2020-05-17: 3 mL via INTRAVENOUS

## 2020-05-17 MED ORDER — LORAZEPAM 2 MG/ML IJ SOLN
1.0000 mg | Freq: Once | INTRAMUSCULAR | Status: AC
  Administered 2020-05-18: 1 mg via INTRAVENOUS
  Filled 2020-05-17: qty 1

## 2020-05-17 NOTE — ED Provider Notes (Addendum)
[facility] Note   CSN: [id number] Arrival date & time: 05/17/20  1532     History Chief Complaint  Patient presents with  . Numbness    [name] is a 75 y.o. female.  The history is provided by the patient.  Neurologic Problem This is a new problem. The current episode started 2 days ago. The problem has been gradually improving. Associated symptoms include headaches. Pertinent negatives include no chest pain, no abdominal pain and no shortness of breath. Nothing aggravates the symptoms. Nothing relieves the symptoms. She has tried nothing for the symptoms.  Headache Pain location:  Occipital Quality:  Sharp Radiates to: top of head. Onset quality:  Sudden Duration:  2 days Progression:  Waxing and waning Chronicity:  Recurrent Similar to prior headaches: states she has this headache when she has her strokes.   Associated symptoms: numbness   Associated symptoms: no abdominal pain, no back pain, no cough, no ear pain, no eye pain, no fever, no seizures, no sore throat and no vomiting      Past Medical History:  Diagnosis Date  . Acute pain of left knee   . Angina pectoris (HCC) 07/28/2016   With normal coronary arteriography  . Carotid stenosis, right 11/18/2018  . Cerebral infarction due to stenosis of right carotid artery (HCC) s/p tPA 11/15/2018  . CHF (congestive heart failure) (HCC)   . Chronic diastolic congestive heart failure (HCC) 07/28/2016  . COPD (chronic obstructive pulmonary disease) (HCC) 01/14/2018  . Diabetes mellitus type II, uncontrolled (HCC) 11/18/2018  . Diabetes mellitus without complication (HCC)   . Diabetic polyneuropathy associated with type 2 diabetes mellitus (HCC) 11/30/2015  . Fall 11/21/2018  . Family hx-stroke 11/18/2018  . Hyperlipidemia   . Hypertension   . Hypertensive heart disease with heart failure (HCC) 07/28/2016  . Living in nursing home   . Lumbar disc narrowing   . Osteoporosis    . Pustules determined by examination 04/11/2018  . Spondylosis    lumbosacral  . Stroke [facility](HCC)     Patient Active Problem List   Diagnosis Date Noted  . Stroke (HCC)   . Spondylosis   . Osteoporosis   . Lumbar disc narrowing   . Living in nursing home   . Hypertension   . Diabetes mellitus without complication (HCC)   . CHF (congestive heart failure) (HCC)   . Acute pain of left knee   . Fall 11/21/2018  . Carotid stenosis, right 11/18/2018  . Diabetes mellitus type II, uncontrolled (HCC) 11/18/2018  . Family hx-stroke 11/18/2018  . Cerebral infarction due to stenosis of right carotid artery (HCC) s/p tPA 11/15/2018  . Pustules determined by examination 04/11/2018  . Hyperlipidemia 01/14/2018  . COPD (chronic obstructive pulmonary disease) (HCC) 01/14/2018  . Angina pectoris (HCC) 07/28/2016  . Chronic diastolic congestive heart failure (HCC) 07/28/2016  . Hypertensive heart disease with heart failure (HCC) 07/28/2016  . Diabetic polyneuropathy associated with type 2 diabetes mellitus (HCC) 11/30/2015    Past Surgical History:  Procedure Laterality Date  . ABDOMINAL HYSTERECTOMY    . APPENDECTOMY    . CARDIAC CATHETERIZATION    . CHOLECYSTECTOMY    . ENDARTERECTOMY Right 11/22/2018   Procedure: ENDARTERECTOMY CAROTID RIGHT;  Surgeon: Cephus Shellinglark, Christopher J, MD;  [facility];  Service: Vascular;  Laterality: Right;  . HIP SURGERY  07/16/2019   Had to repair a lot of damage  . KNEE ARTHROCENTESIS  11/22/2018      .  PATCH ANGIOPLASTY Right 11/22/2018   Procedure: Patch Angioplasty Right Carotid Artery using Xenosure Biologic Patch;  Surgeon: Cephus Shelling, MD;  Location: [facility] OR;  Service: Vascular;  Laterality: Right;     OB History   No obstetric history on file.     Family History  Problem Relation Age of Onset  . Stroke Mother   . Hypertension Mother   . Diabetes Mother   . Heart disease Mother   . Prostate cancer Father   . Alcohol abuse Father   .  CAD Sister   . CAD Sister   . Valvular heart disease Sister     Social History   Tobacco Use  . Smoking status: Never Smoker  . Smokeless tobacco: Never Used  Vaping Use  . Vaping Use: Never used  Substance Use Topics  . Alcohol use: Not Currently  . Drug use: Never    Home Medications Prior to Admission medications   Medication Sig Start Date End Date Taking? Authorizing Provider  alendronate (FOSAMAX) 70 MG tablet Take 70 mg by mouth once a week. Take with a full glass of water on an empty stomach.    [provider]  atorvastatin (LIPITOR) 80 MG tablet Take 80 mg by mouth at bedtime.     [provider]  clopidogrel (PLAVIX) 75 MG tablet Take 75 mg by mouth every morning.     [provider]  ezetimibe (ZETIA) 10 MG tablet Take 10 mg by mouth every morning.     [provider]  gabapentin (NEURONTIN) 300 MG capsule Take 300 mg by mouth in the morning and at bedtime.    [provider]  glimepiride (AMARYL) 4 MG tablet Take 1 tablet (4 mg total) by mouth 2 (two) times daily with a meal. 11/18/18   Layne Benton, NP  hydrALAZINE (APRESOLINE) 25 MG tablet 25 mg in the morning, at noon, and at bedtime. 12/23/19   [provider]  insulin glargine (LANTUS) 100 UNIT/ML injection Inject 26 Units into the skin daily.    [provider]  insulin lispro (HUMALOG) 100 UNIT/ML injection Inject 0.03 mLs (3 Units total) into the skin 3 (three) times daily with meals. 11/26/18   Jenell Milliner, MD  JANUVIA 100 MG tablet Take 100 mg by mouth daily. 12/23/19   [provider]  lisinopril (ZESTRIL) 20 MG tablet Take 20 mg by mouth daily. 12/23/19   [provider]  nitroGLYCERIN (NITROSTAT) 0.4 MG SL tablet Place 0.4 mg under the tongue every 5 (five) minutes as needed for chest pain.     [provider]  rOPINIRole (REQUIP) 1 MG tablet Take 1 mg by mouth at bedtime.     [provider]  torsemide  (DEMADEX) 100 MG tablet Take 100 mg by mouth 2 (two) times daily.     [provider]  valsartan (DIOVAN) 320 MG tablet Take 320 mg by mouth daily. 12/23/19   [provider]    Allergies    Patient has no known allergies.  Review of Systems   Review of Systems  Constitutional: Negative for chills and fever.  HENT: Negative for ear pain and sore throat.   Eyes: Negative for pain and visual disturbance.  Respiratory: Negative for cough and shortness of breath.   Cardiovascular: Negative for chest pain and palpitations.  Gastrointestinal: Negative for abdominal pain and vomiting.  Genitourinary: Negative for dysuria and hematuria.  Musculoskeletal: Negative for arthralgias and back pain.  Skin: Negative  for color change and rash.  Neurological: Positive for numbness and headaches. Negative for seizures and syncope.  All other systems reviewed and are negative.   Physical Exam Updated Vital Signs BP (!) 151/74   Pulse 84   Temp 98.4 F (36.9 C) (Oral)   Resp 15   SpO2 98%   Physical Exam Vitals and nursing note reviewed.  Constitutional:      General: She is not in acute distress.    Appearance: She is well-developed and well-nourished.  HENT:     Head: Normocephalic and atraumatic.  Eyes:     Extraocular Movements:     Right eye: Normal extraocular motion.     Left eye: Normal extraocular motion.     Conjunctiva/sclera: Conjunctivae normal.     Pupils: Pupils are equal, round, and reactive to light. Pupils are equal.     Right eye: Pupil is round and reactive.     Left eye: Pupil is round and reactive.     Comments: Pupils 44mm reactive  Cardiovascular:     Rate and Rhythm: Normal rate and regular rhythm.     Heart sounds: No murmur heard.   Pulmonary:     Effort: Pulmonary effort is normal. No respiratory distress.     Breath sounds: Normal breath sounds.  Abdominal:     Palpations: Abdomen is soft.     Tenderness: There is no abdominal  tenderness.  Musculoskeletal:        General: No edema.     Cervical back: Neck supple.  Skin:    General: Skin is warm and dry.     Comments: Lower extremities with acewraps in place.  Neurological:     Mental Status: She is alert.     Cranial Nerves: Dysarthria present. No facial asymmetry.     Motor: Weakness present.     Comments: RUE 4/5 strength with grip and elbow flexion/extension LUE 5/5 strength with grip, elbow flexion/extension  LLE: 5/5 strength with hip flexion, dorsiflexion/plantarflexion RLE: 5/5 strength with hip flexion. 4/4 strength with dorsiflexion/plantarflexion  Slight dysarthria with intermittent staccato/stuttering speech  Psychiatric:        Mood and Affect: Mood and affect normal.     ED Results / Procedures / Treatments   Labs (all labs ordered are listed, but only abnormal results are displayed) Labs Reviewed  CBC - Abnormal; Notable for the following components:      Result Value   WBC 13.2 (*)    All other components within normal limits  DIFFERENTIAL - Abnormal; Notable for the following components:   Neutro Abs 10.2 (*)    All other components within normal limits  COMPREHENSIVE METABOLIC PANEL - Abnormal; Notable for the following components:   Potassium 3.3 (*)    Glucose, Bld 169 (*)    All other components within normal limits  I-STAT CHEM 8, ED - Abnormal; Notable for the following components:   Potassium 3.4 (*)    Glucose, Bld 173 (*)    All other components within normal limits  CBG MONITORING, ED - Abnormal; Notable for the following components:   Glucose-Capillary 177 (*)    All other components within normal limits  PROTIME-INR  APTT    EKG EKG Interpretation  Date/Time:  Monday May 17 2020 15:41:08 EST Ventricular Rate:  74 PR Interval:  134 QRS Duration: 108 QT Interval:  392 QTC Calculation: 435 R Axis:   -52 Text Interpretation: Sinus rhythm with marked sinus arrhythmia Pulmonary disease pattern Left  anterior fascicular block Abnormal ECG When compared to prior, similar appearance. No STEMI Confirmed by Theda Belfastegeler, Chris ([id number]) on 05/17/2020 9:41:29 PM   Radiology CT HEAD WO CONTRAST  Result Date: 05/17/2020 CLINICAL DATA:  75 year old female with transient ischemic attack. EXAM: CT HEAD WITHOUT CONTRAST TECHNIQUE: Contiguous axial images were obtained from the base of the skull through the vertex without intravenous contrast. COMPARISON:  Head CT dated 01/29/2019. FINDINGS: Brain: There is mild age-related atrophy and chronic microvascular ischemic changes. There is no acute intracranial hemorrhage. No mass effect or midline shift. No extra-axial fluid collection. Vascular: No hyperdense vessel or unexpected calcification. Skull: Normal. Negative for fracture or focal lesion. Sinuses/Orbits: No acute finding. Other: None IMPRESSION: 1. No acute intracranial pathology. 2. Mild age-related atrophy and chronic microvascular ischemic changes. Electronically Signed   By: Elgie CollardArash  Radparvar M.D.   On: 05/17/2020 18:01    Procedures Procedures (including critical care time)  Medications Ordered in ED Medications  LORazepam (ATIVAN) injection 1 mg (has no administration in time range)  sodium chloride flush (NS) 0.9 % injection 3 mL (3 mLs Intravenous Given 05/17/20 2211)    ED Course  I have reviewed the triage vital signs and the nursing notes.  Pertinent labs & imaging results that were available during my care of the patient were reviewed by me and considered in my medical decision making (see chart for details).    MDM Rules/Calculators/A&P                          This is a 75 year old, female, with a PMHx of CHF, HTN, HLD, CVA (2020 R carotid stenosis with ischemia s/p tPA), who presents with headache, word finding difficulty, slurred speech, left arm numbness, for the last two days. She reports on Saturday she had a right sided posterior headache that radiated to the top of her head which  she states was the symptoms she had with her previous strokes. She states "I've had seven strokes". Sunday she developed some trouble with forming words and having slurred speech which has continued. Today while doing dishes she had an episode that is described as TGA (transient global amnesia) in which she couldn't remember what she was doing or where she was that was transient and resolved. Also today she awoke with some arm tingling which has continued, but improved.  On exam she is afebrile, HDS, no acute distress. She has some mild dysathria which patient reports is close to her baseline, however some words are more slurred than normal. CN II-XII otherwise intact. Romberg negative. Some difficulty with finger to nose, but no past pointing or dysmetria. She has 4/5 strength with R arm flexion and hand grip as well as 4/5 strength with dorsiflexion of the R foot. 5/5 strength throughout left arm. Subjective paresthesias over the left upper arm.  Given hx of previous CVA's and current symptoms, concern for TIA's versus CVA event. Onset of symptoms >24 hours ago, is not a tPA candidate or less likely to be thrombectomy candidate. CT non-contrast head shows chronic old changes. Spoke with Neurology, Dr. Iver NestleBhagat, we will obtain an MRI without to further evaluate for acute/subacute changes. If negatiave, will reengage neurology and perform further TIA/vessel imaging work-up with likely CTA head/neck. If patient's MRI positive for acute/subacute stroke, will again engage Neurology for further recommendations.  Patient given 1mg  ativan for anxiolysis related to MRI. MRI pending, patient's clinical course is incomplete at the end of my  shift. Patient signed out to oncoming provider at 2330 with MRI pending. May require further complete TIA work-up as last ischemic stroke work-up was completed in July 2020.  Final Clinical Impression(s) / ED Diagnoses Final diagnoses:  None    Rx / DC Orders ED Discharge  Orders    None       Kathleen Lime, MD 05/17/20 0626    Kathleen Lime, MD 05/17/20 9485    Tegeler, Canary Brim, MD 05/19/20 1106

## 2020-05-17 NOTE — ED Triage Notes (Signed)
Pt BIB [facility] for numbness to Left side of face, intermittent frontal and posterior headache, difficulty finding her words x3 days. Family reports this is how she presented with her last stroke. Hx of 7 strokes HHRN reports pt's spouse her CBG was 48, took 2 glucose tablets  BP 130/70 HR 96 97% RA  CBG 310

## 2020-05-18 ENCOUNTER — Emergency Department (HOSPITAL_COMMUNITY): Payer: Medicare Other

## 2020-05-18 DIAGNOSIS — R519 Headache, unspecified: Secondary | ICD-10-CM | POA: Diagnosis not present

## 2020-05-18 DIAGNOSIS — I619 Nontraumatic intracerebral hemorrhage, unspecified: Secondary | ICD-10-CM | POA: Diagnosis not present

## 2020-05-18 DIAGNOSIS — Z8673 Personal history of transient ischemic attack (TIA), and cerebral infarction without residual deficits: Secondary | ICD-10-CM | POA: Diagnosis not present

## 2020-05-18 DIAGNOSIS — R2990 Unspecified symptoms and signs involving the nervous system: Secondary | ICD-10-CM | POA: Diagnosis not present

## 2020-05-18 LAB — URINALYSIS, ROUTINE W REFLEX MICROSCOPIC
Bilirubin Urine: NEGATIVE
Glucose, UA: >=500 mg/dL — AB
Ketones, ur: NEGATIVE mg/dL
Nitrite: NEGATIVE
Protein, ur: 30 mg/dL — AB
Specific Gravity, Urine: 1.009 (ref 1.005–1.030)
WBC, UA: >50 WBC/hpf — ABNORMAL HIGH (ref 0–5)
pH: 5 (ref 5.0–8.0)

## 2020-05-18 LAB — RPR: RPR Ser Ql: NONREACTIVE

## 2020-05-18 LAB — HIV ANTIBODY (ROUTINE TESTING W REFLEX): HIV Screen 4th Generation wRfx: NONREACTIVE

## 2020-05-18 LAB — RAPID URINE DRUG SCREEN, HOSP PERFORMED
Amphetamines: NOT DETECTED
Barbiturates: NOT DETECTED
Benzodiazepines: NOT DETECTED
Cocaine: NOT DETECTED
Opiates: NOT DETECTED
Tetrahydrocannabinol: NOT DETECTED

## 2020-05-18 LAB — VITAMIN B12: Vitamin B-12: 251 pg/mL (ref 180–914)

## 2020-05-18 LAB — AMMONIA: Ammonia: 16 umol/L (ref 9–35)

## 2020-05-18 NOTE — Discharge Instructions (Addendum)
Follow-up with your primary doctor in the next week, and return to the ER if symptoms significantly worsen or change.  Continue your other medications as previously prescribed.

## 2020-05-18 NOTE — Consult Note (Signed)
Neurology Consultation Reason for Consult: Left face and arm numbness Requesting Physician: Kathleen Lime  CC: Left face and arm numbness  History is obtained from: Patient and chart review   HPI: [name] is a 75 y.o. female with a PMHx sign for R BG stroke s/p tPA and right CEA (11/2018), uncontrolled diabetes (A1c 10.4% in 11/2018), hypertension, uncontrolled hyperlipidemia (LDL 157 in 11/2018), congestive heart failure, COPD.  History is extremely challenging as patient is tangential and gives different answers to the same question at different times.  Cannot reach family for collateral.  With that caveat, as best as I can determine, she reports that she had numbness of the left upper extremity and face in a similar distribution as her stroke starting on Saturday.  She thought that this may go away but it was persistent.  She has additionally been having episodes where she has "a flash of pain" which lasts for a few seconds followed by "going black" for a few seconds, followed by a few seconds of confusion.  She additionally complains of some blisters on her hands that have been improving with cream, 1 year of incontinence, and noticeable headache.  She reports that she has not been having any light or sound sensitivity but it does feel like she regurgitates water.  She does not have any signs or symptoms of infection other than perhaps some burning when she urinates.  She denies any fevers, cough, cold.  Regarding her headache, she describes that it "feels like an elephant is sitting on my head" which has markedly improved during the course of her ED stay.  She denies any associated nausea or vomiting though she does report she feels like she regurgitates some water with this.  She reports she lives with her husband and that I may contact him but then tangentially explains he is having a neck mass addressed at [facility] due to exposure to agent orange in his military days.  She reports he is  not allowed to drive himself to that appointment and that she cannot drive due to the numbness in her legs from her diabetes that is longstanding.  She also reports he is hard of hearing.  There is no contact information listed for her husband in the chart, and when I attempted to contact her son there was no answer despite multiple attempts.  LKW: Saturday 05/15/20 tPA given?: No, out of the window  ROS: A 14 point ROS was performed and is negative except as noted in the HPI.   Past Medical History:  Diagnosis Date  . Acute pain of left knee   . Angina pectoris (HCC) 07/28/2016   With normal coronary arteriography  . Carotid stenosis, right 11/18/2018  . Cerebral infarction due to stenosis of right carotid artery (HCC) s/p tPA 11/15/2018  . CHF (congestive heart failure) (HCC)   . Chronic diastolic congestive heart failure (HCC) 07/28/2016  . COPD (chronic obstructive pulmonary disease) (HCC) 01/14/2018  . Diabetes mellitus type II, uncontrolled (HCC) 11/18/2018  . Diabetes mellitus without complication (HCC)   . Diabetic polyneuropathy associated with type 2 diabetes mellitus (HCC) 11/30/2015  . Fall 11/21/2018  . Family hx-stroke 11/18/2018  . Hyperlipidemia   . Hypertension   . Hypertensive heart disease with heart failure (HCC) 07/28/2016  . Living in nursing home   . Lumbar disc narrowing   . Osteoporosis   . Pustules determined by examination 04/11/2018  . Spondylosis    lumbosacral  . Stroke (HCC)  Past Surgical History:  Procedure Laterality Date  . ABDOMINAL HYSTERECTOMY    . APPENDECTOMY    . CARDIAC CATHETERIZATION    . CHOLECYSTECTOMY    . ENDARTERECTOMY Right 11/22/2018   Procedure: ENDARTERECTOMY CAROTID RIGHT;  Surgeon: Cephus Shelling, MD;  Location: [facility] OR;  Service: Vascular;  Laterality: Right;  . HIP SURGERY  07/16/2019   Had to repair a lot of damage  . KNEE ARTHROCENTESIS  11/22/2018      . PATCH ANGIOPLASTY Right 11/22/2018   Procedure: Patch Angioplasty  Right Carotid Artery using Xenosure Biologic Patch;  Surgeon: Cephus Shelling, MD;  Location[facility] OR;  Service: Vascular;  Laterality: Right;   No current facility-administered medications for this encounter.  Current Outpatient Medications:  .  alendronate (FOSAMAX) 70 MG tablet, Take 70 mg by mouth every Sunday., Disp: , Rfl:  .  atorvastatin (LIPITOR) 80 MG tablet, Take 80 mg by mouth at bedtime. , Disp: , Rfl:  .  clopidogrel (PLAVIX) 75 MG tablet, Take 75 mg by mouth every morning. , Disp: , Rfl:  .  ezetimibe (ZETIA) 10 MG tablet, Take 10 mg by mouth every morning. , Disp: , Rfl:  .  gabapentin (NEURONTIN) 300 MG capsule, Take 300 mg by mouth in the morning and at bedtime., Disp: , Rfl:  .  glimepiride (AMARYL) 4 MG tablet, Take 1 tablet (4 mg total) by mouth 2 (two) times daily with a meal., Disp: 60 tablet, Rfl: 2 .  hydrALAZINE (APRESOLINE) 25 MG tablet, 25 mg in the morning, at noon, and at bedtime., Disp: , Rfl:  .  insulin glargine (LANTUS) 100 UNIT/ML injection, Inject 35 Units into the skin 2 (two) times daily., Disp: , Rfl:  .  insulin lispro (HUMALOG) 100 UNIT/ML injection, Inject 0.03 mLs (3 Units total) into the skin 3 (three) times daily with meals., Disp: 10 mL, Rfl: 0 .  JANUVIA 100 MG tablet, Take 100 mg by mouth daily., Disp: , Rfl:  .  lisinopril (ZESTRIL) 20 MG tablet, Take 10 mg by mouth daily., Disp: , Rfl:  .  nitroGLYCERIN (NITROSTAT) 0.4 MG SL tablet, Place 0.4 mg under the tongue every 5 (five) minutes as needed for chest pain. , Disp: , Rfl:  .  rOPINIRole (REQUIP) 1 MG tablet, Take 1 mg by mouth at bedtime. , Disp: , Rfl:  .  torsemide (DEMADEX) 100 MG tablet, Take 100 mg by mouth 2 (two) times daily., Disp: , Rfl:  .  valsartan (DIOVAN) 320 MG tablet, Take 320 mg by mouth daily., Disp: , Rfl:    Family History  Problem Relation Age of Onset  . Stroke Mother   . Hypertension Mother   . Diabetes Mother   . Heart disease Mother   . Prostate cancer Father    . Alcohol abuse Father   . CAD Sister   . CAD Sister   . Valvular heart disease Sister    Social History:  reports that she has never smoked. She has never used smokeless tobacco. She reports previous alcohol use. She reports that she does not use drugs.   Exam: Current vital signs: BP (!) 130/55   Pulse 94   Temp 97.6 F (36.4 C) (Oral)   Resp 19   SpO2 100%  Vital signs in last 24 hours: Temp:  [97.6 F (36.4 C)-98.4 F (36.9 C)] 97.6 F (36.4 C) (01/11 0153) Pulse Rate:  [50-98] 94 (01/11 0200) Resp:  [15-23] 19 (01/11 0200) BP: (130-158)/(55-95) 130/55 (  01/11 0200) SpO2:  [95 %-100 %] 100 % (01/11 0200)   Physical Exam  Constitutional: Appears well-developed and well-nourished.  Psych: Affect somewhat flat but mildly anxious Eyes: No scleral injection HENT: No OP obstruction MSK: no joint deformities.  Cardiovascular: Normal rate and regular rhythm.  Respiratory: Effort normal, non-labored breathing GI: Soft.  No distension. There is no tenderness.  Skin: Some healing blisters on bilateral hands.  Bilateral lower extremities are bandaged with Ace bandages and she reports blisters there as well.  Neuro: Mental Status: Patient is awake, alert, oriented to person, place, month, year, and situation. Patient is able to give reasonable history though as noted above she is quite tangential and sometimes perseverative She is able to name simple things but stutters significantly when asked to repeat.  She is also unable to name stethoscope Cranial Nerves: II: Visual Fields are full. Pupils are equal, round, and reactive to light 4 to 2 mm III,IV, VI: EOMI without ptosis or diploplia, though she has saccadic pursuits as well as saccadic intrusions V: Facial sensation is symmetric reduced on the left compared to the right (chronic per her report) VII: Facial movement is symmetric.  VIII: hearing is intact to voice X: Uvula elevates symmetrically XI: Shoulder shrug is  symmetric. XII: tongue is midline without atrophy or fasciculations.  Motor: She is briefly antigravity with bilateral lower extremities.  She has mild pronation of the left upper extremity without drift and is able to maintain the right upper extremity antigravity.  She is 5 out of 5 in the right upper extremity, 4 out of 5 in the left upper extremity, and bilateral lower extremity examination is limited by pain Sensory: Sensation is symmetric to light touch and temperature in the arms and legs Plantars: Toes are mute bilaterally.  Cerebellar: Finger-nose intact bilaterally, slower on the left than the right Gait: Not personally assessed however the patient has a walker at bedside and reports that she ambulated at her baseline level of mobility with a walker to the restroom  I have reviewed labs in epic and the results pertinent to this consultation are: Last CBC Lab Results  Component Value Date   WBC 13.2 (H) 05/17/2020   HGB 13.9 05/17/2020   HCT 41.0 05/17/2020   MCV 84.3 05/17/2020   MCH 26.9 05/17/2020   RDW 14.5 05/17/2020   PLT 269 05/17/2020   Last metabolic panel Lab Results  Component Value Date   GLUCOSE 173 (H) 05/17/2020   NA 140 05/17/2020   K 3.4 (L) 05/17/2020   CL 102 05/17/2020   CO2 23 05/17/2020   BUN 21 05/17/2020   CREATININE 0.70 05/17/2020   GFRNONAA >60 05/17/2020   GFRAA >60 01/12/2019   CALCIUM 9.1 05/17/2020   PROT 6.9 05/17/2020   ALBUMIN 3.7 05/17/2020   BILITOT 0.7 05/17/2020   ALKPHOS 99 05/17/2020   AST 28 05/17/2020   ALT 17 05/17/2020   ANIONGAP 13 05/17/2020   Lab Results  Component Value Date   CHOL 226 (H) 11/16/2018   HDL 44 11/16/2018   LDLCALC 157 (H) 11/16/2018   TRIG 124 11/16/2018   CHOLHDL 5.1 11/16/2018   Lab Results  Component Value Date   HGBA1C 10.4 (H) 11/16/2018     I have reviewed the images obtained: MRI brain w/o acute infarct  Impression: This is a 75 year old woman with a past medical history  significant for vascular risk factors as above, presenting with similar symptoms to her last stroke.  Potential recrudescence  in the setting of infection given her leukocytosis.  Fortunately, her MRI is reassuring against any new stroke.   Recommendations: -UA, U tox to assess for UTI given her complaints of incontinence and burning when she urinates -B12, B1, ammonia, TSH, RPR, HIV for reversible causes of dementia work-up; can be followed up by PCP -Any additional infectious work-up per primary team/ED -Given negative MRI brain, no further inpatient neurologic work-up needed at this time -Continued outpatient optimization of her stroke risk factors including diabetes and hyperlipidemia -Outpatient follow-up with neurology  Brooke DareSrishti Jarion Hawthorne MD-PhD [facility] [phone]

## 2020-05-18 NOTE — ED Notes (Signed)
Pt given paper pants and gripper socks to wear home.  Pt states has shorts and shoes, but none are in the room at this time.

## 2020-05-18 NOTE — ED Notes (Signed)
Urine culture sent down with u/a. Patient cleaned up and new brief put on

## 2020-05-18 NOTE — ED Notes (Signed)
Pt to MRI

## 2020-05-21 DIAGNOSIS — D519 Vitamin B12 deficiency anemia, unspecified: Secondary | ICD-10-CM | POA: Diagnosis not present

## 2020-05-21 LAB — VITAMIN B1: Vitamin B1 (Thiamine): 122.1 nmol/L (ref 66.5–200.0)

## 2020-05-25 DIAGNOSIS — I509 Heart failure, unspecified: Secondary | ICD-10-CM | POA: Diagnosis not present

## 2020-05-25 DIAGNOSIS — E1122 Type 2 diabetes mellitus with diabetic chronic kidney disease: Secondary | ICD-10-CM | POA: Diagnosis not present

## 2020-05-25 DIAGNOSIS — D631 Anemia in chronic kidney disease: Secondary | ICD-10-CM | POA: Diagnosis not present

## 2020-05-25 DIAGNOSIS — R2243 Localized swelling, mass and lump, lower limb, bilateral: Secondary | ICD-10-CM | POA: Diagnosis not present

## 2020-05-25 DIAGNOSIS — N189 Chronic kidney disease, unspecified: Secondary | ICD-10-CM | POA: Diagnosis not present

## 2020-05-25 DIAGNOSIS — M810 Age-related osteoporosis without current pathological fracture: Secondary | ICD-10-CM | POA: Diagnosis not present

## 2020-06-02 DIAGNOSIS — R2243 Localized swelling, mass and lump, lower limb, bilateral: Secondary | ICD-10-CM | POA: Diagnosis not present

## 2020-06-02 DIAGNOSIS — D631 Anemia in chronic kidney disease: Secondary | ICD-10-CM | POA: Diagnosis not present

## 2020-06-02 DIAGNOSIS — E1122 Type 2 diabetes mellitus with diabetic chronic kidney disease: Secondary | ICD-10-CM | POA: Diagnosis not present

## 2020-06-02 DIAGNOSIS — N189 Chronic kidney disease, unspecified: Secondary | ICD-10-CM | POA: Diagnosis not present

## 2020-06-02 DIAGNOSIS — I509 Heart failure, unspecified: Secondary | ICD-10-CM | POA: Diagnosis not present

## 2020-06-02 DIAGNOSIS — M810 Age-related osteoporosis without current pathological fracture: Secondary | ICD-10-CM | POA: Diagnosis not present

## 2020-06-03 DIAGNOSIS — D631 Anemia in chronic kidney disease: Secondary | ICD-10-CM | POA: Diagnosis not present

## 2020-06-03 DIAGNOSIS — K5904 Chronic idiopathic constipation: Secondary | ICD-10-CM | POA: Diagnosis not present

## 2020-06-03 DIAGNOSIS — E1122 Type 2 diabetes mellitus with diabetic chronic kidney disease: Secondary | ICD-10-CM | POA: Diagnosis not present

## 2020-06-03 DIAGNOSIS — Z8673 Personal history of transient ischemic attack (TIA), and cerebral infarction without residual deficits: Secondary | ICD-10-CM | POA: Diagnosis not present

## 2020-06-03 DIAGNOSIS — E1140 Type 2 diabetes mellitus with diabetic neuropathy, unspecified: Secondary | ICD-10-CM | POA: Diagnosis not present

## 2020-06-03 DIAGNOSIS — N189 Chronic kidney disease, unspecified: Secondary | ICD-10-CM | POA: Diagnosis not present

## 2020-06-03 DIAGNOSIS — I130 Hypertensive heart and chronic kidney disease with heart failure and stage 1 through stage 4 chronic kidney disease, or unspecified chronic kidney disease: Secondary | ICD-10-CM | POA: Diagnosis not present

## 2020-06-03 DIAGNOSIS — R2243 Localized swelling, mass and lump, lower limb, bilateral: Secondary | ICD-10-CM | POA: Diagnosis not present

## 2020-06-03 DIAGNOSIS — E785 Hyperlipidemia, unspecified: Secondary | ICD-10-CM | POA: Diagnosis not present

## 2020-06-03 DIAGNOSIS — K219 Gastro-esophageal reflux disease without esophagitis: Secondary | ICD-10-CM | POA: Diagnosis not present

## 2020-06-03 DIAGNOSIS — I509 Heart failure, unspecified: Secondary | ICD-10-CM | POA: Diagnosis not present

## 2020-06-03 DIAGNOSIS — Z7901 Long term (current) use of anticoagulants: Secondary | ICD-10-CM | POA: Diagnosis not present

## 2020-06-03 DIAGNOSIS — M810 Age-related osteoporosis without current pathological fracture: Secondary | ICD-10-CM | POA: Diagnosis not present

## 2020-06-03 DIAGNOSIS — J45909 Unspecified asthma, uncomplicated: Secondary | ICD-10-CM | POA: Diagnosis not present

## 2020-06-03 DIAGNOSIS — Z96641 Presence of right artificial hip joint: Secondary | ICD-10-CM | POA: Diagnosis not present

## 2020-06-03 DIAGNOSIS — H9190 Unspecified hearing loss, unspecified ear: Secondary | ICD-10-CM | POA: Diagnosis not present

## 2020-06-08 DIAGNOSIS — B351 Tinea unguium: Secondary | ICD-10-CM | POA: Diagnosis not present

## 2020-06-08 DIAGNOSIS — M810 Age-related osteoporosis without current pathological fracture: Secondary | ICD-10-CM | POA: Diagnosis not present

## 2020-06-08 DIAGNOSIS — E119 Type 2 diabetes mellitus without complications: Secondary | ICD-10-CM | POA: Diagnosis not present

## 2020-06-08 DIAGNOSIS — M79674 Pain in right toe(s): Secondary | ICD-10-CM | POA: Diagnosis not present

## 2020-06-08 DIAGNOSIS — N189 Chronic kidney disease, unspecified: Secondary | ICD-10-CM | POA: Diagnosis not present

## 2020-06-08 DIAGNOSIS — I509 Heart failure, unspecified: Secondary | ICD-10-CM | POA: Diagnosis not present

## 2020-06-08 DIAGNOSIS — E1122 Type 2 diabetes mellitus with diabetic chronic kidney disease: Secondary | ICD-10-CM | POA: Diagnosis not present

## 2020-06-08 DIAGNOSIS — R2243 Localized swelling, mass and lump, lower limb, bilateral: Secondary | ICD-10-CM | POA: Diagnosis not present

## 2020-06-08 DIAGNOSIS — D631 Anemia in chronic kidney disease: Secondary | ICD-10-CM | POA: Diagnosis not present

## 2020-06-15 DIAGNOSIS — I509 Heart failure, unspecified: Secondary | ICD-10-CM | POA: Diagnosis not present

## 2020-06-15 DIAGNOSIS — N189 Chronic kidney disease, unspecified: Secondary | ICD-10-CM | POA: Diagnosis not present

## 2020-06-15 DIAGNOSIS — E1122 Type 2 diabetes mellitus with diabetic chronic kidney disease: Secondary | ICD-10-CM | POA: Diagnosis not present

## 2020-06-15 DIAGNOSIS — M810 Age-related osteoporosis without current pathological fracture: Secondary | ICD-10-CM | POA: Diagnosis not present

## 2020-06-15 DIAGNOSIS — R2243 Localized swelling, mass and lump, lower limb, bilateral: Secondary | ICD-10-CM | POA: Diagnosis not present

## 2020-06-15 DIAGNOSIS — D631 Anemia in chronic kidney disease: Secondary | ICD-10-CM | POA: Diagnosis not present

## 2020-06-16 DIAGNOSIS — D519 Vitamin B12 deficiency anemia, unspecified: Secondary | ICD-10-CM | POA: Diagnosis not present

## 2020-06-16 DIAGNOSIS — I5032 Chronic diastolic (congestive) heart failure: Secondary | ICD-10-CM | POA: Diagnosis not present

## 2020-06-16 DIAGNOSIS — I6990 Unspecified sequelae of unspecified cerebrovascular disease: Secondary | ICD-10-CM | POA: Diagnosis not present

## 2020-06-16 DIAGNOSIS — I5030 Unspecified diastolic (congestive) heart failure: Secondary | ICD-10-CM | POA: Diagnosis not present

## 2020-06-16 DIAGNOSIS — E1142 Type 2 diabetes mellitus with diabetic polyneuropathy: Secondary | ICD-10-CM | POA: Diagnosis not present

## 2020-06-16 DIAGNOSIS — I110 Hypertensive heart disease with heart failure: Secondary | ICD-10-CM | POA: Diagnosis not present

## 2020-06-16 DIAGNOSIS — E785 Hyperlipidemia, unspecified: Secondary | ICD-10-CM | POA: Diagnosis not present

## 2020-06-16 DIAGNOSIS — E1165 Type 2 diabetes mellitus with hyperglycemia: Secondary | ICD-10-CM | POA: Diagnosis not present

## 2020-06-21 DIAGNOSIS — D519 Vitamin B12 deficiency anemia, unspecified: Secondary | ICD-10-CM | POA: Diagnosis not present

## 2020-06-23 DIAGNOSIS — I509 Heart failure, unspecified: Secondary | ICD-10-CM | POA: Diagnosis not present

## 2020-06-23 DIAGNOSIS — R2243 Localized swelling, mass and lump, lower limb, bilateral: Secondary | ICD-10-CM | POA: Diagnosis not present

## 2020-06-23 DIAGNOSIS — N189 Chronic kidney disease, unspecified: Secondary | ICD-10-CM | POA: Diagnosis not present

## 2020-06-23 DIAGNOSIS — M810 Age-related osteoporosis without current pathological fracture: Secondary | ICD-10-CM | POA: Diagnosis not present

## 2020-06-23 DIAGNOSIS — D631 Anemia in chronic kidney disease: Secondary | ICD-10-CM | POA: Diagnosis not present

## 2020-06-23 DIAGNOSIS — E1122 Type 2 diabetes mellitus with diabetic chronic kidney disease: Secondary | ICD-10-CM | POA: Diagnosis not present

## 2020-06-29 DIAGNOSIS — I509 Heart failure, unspecified: Secondary | ICD-10-CM | POA: Diagnosis not present

## 2020-06-29 DIAGNOSIS — R2243 Localized swelling, mass and lump, lower limb, bilateral: Secondary | ICD-10-CM | POA: Diagnosis not present

## 2020-06-29 DIAGNOSIS — D631 Anemia in chronic kidney disease: Secondary | ICD-10-CM | POA: Diagnosis not present

## 2020-06-29 DIAGNOSIS — E1122 Type 2 diabetes mellitus with diabetic chronic kidney disease: Secondary | ICD-10-CM | POA: Diagnosis not present

## 2020-06-29 DIAGNOSIS — N189 Chronic kidney disease, unspecified: Secondary | ICD-10-CM | POA: Diagnosis not present

## 2020-06-29 DIAGNOSIS — M810 Age-related osteoporosis without current pathological fracture: Secondary | ICD-10-CM | POA: Diagnosis not present

## 2020-07-03 DIAGNOSIS — M810 Age-related osteoporosis without current pathological fracture: Secondary | ICD-10-CM | POA: Diagnosis not present

## 2020-07-03 DIAGNOSIS — E1140 Type 2 diabetes mellitus with diabetic neuropathy, unspecified: Secondary | ICD-10-CM | POA: Diagnosis not present

## 2020-07-03 DIAGNOSIS — K5904 Chronic idiopathic constipation: Secondary | ICD-10-CM | POA: Diagnosis not present

## 2020-07-03 DIAGNOSIS — Z7901 Long term (current) use of anticoagulants: Secondary | ICD-10-CM | POA: Diagnosis not present

## 2020-07-03 DIAGNOSIS — I130 Hypertensive heart and chronic kidney disease with heart failure and stage 1 through stage 4 chronic kidney disease, or unspecified chronic kidney disease: Secondary | ICD-10-CM | POA: Diagnosis not present

## 2020-07-03 DIAGNOSIS — D631 Anemia in chronic kidney disease: Secondary | ICD-10-CM | POA: Diagnosis not present

## 2020-07-03 DIAGNOSIS — E785 Hyperlipidemia, unspecified: Secondary | ICD-10-CM | POA: Diagnosis not present

## 2020-07-03 DIAGNOSIS — K219 Gastro-esophageal reflux disease without esophagitis: Secondary | ICD-10-CM | POA: Diagnosis not present

## 2020-07-03 DIAGNOSIS — Z8673 Personal history of transient ischemic attack (TIA), and cerebral infarction without residual deficits: Secondary | ICD-10-CM | POA: Diagnosis not present

## 2020-07-03 DIAGNOSIS — J45909 Unspecified asthma, uncomplicated: Secondary | ICD-10-CM | POA: Diagnosis not present

## 2020-07-03 DIAGNOSIS — N189 Chronic kidney disease, unspecified: Secondary | ICD-10-CM | POA: Diagnosis not present

## 2020-07-03 DIAGNOSIS — E1122 Type 2 diabetes mellitus with diabetic chronic kidney disease: Secondary | ICD-10-CM | POA: Diagnosis not present

## 2020-07-03 DIAGNOSIS — H9190 Unspecified hearing loss, unspecified ear: Secondary | ICD-10-CM | POA: Diagnosis not present

## 2020-07-03 DIAGNOSIS — R2243 Localized swelling, mass and lump, lower limb, bilateral: Secondary | ICD-10-CM | POA: Diagnosis not present

## 2020-07-03 DIAGNOSIS — I509 Heart failure, unspecified: Secondary | ICD-10-CM | POA: Diagnosis not present

## 2020-07-03 DIAGNOSIS — Z96641 Presence of right artificial hip joint: Secondary | ICD-10-CM | POA: Diagnosis not present

## 2020-07-06 DIAGNOSIS — D631 Anemia in chronic kidney disease: Secondary | ICD-10-CM | POA: Diagnosis not present

## 2020-07-06 DIAGNOSIS — M810 Age-related osteoporosis without current pathological fracture: Secondary | ICD-10-CM | POA: Diagnosis not present

## 2020-07-06 DIAGNOSIS — E1122 Type 2 diabetes mellitus with diabetic chronic kidney disease: Secondary | ICD-10-CM | POA: Diagnosis not present

## 2020-07-06 DIAGNOSIS — N189 Chronic kidney disease, unspecified: Secondary | ICD-10-CM | POA: Diagnosis not present

## 2020-07-06 DIAGNOSIS — R2243 Localized swelling, mass and lump, lower limb, bilateral: Secondary | ICD-10-CM | POA: Diagnosis not present

## 2020-07-06 DIAGNOSIS — I509 Heart failure, unspecified: Secondary | ICD-10-CM | POA: Diagnosis not present

## 2020-07-09 DIAGNOSIS — R2243 Localized swelling, mass and lump, lower limb, bilateral: Secondary | ICD-10-CM | POA: Diagnosis not present

## 2020-07-09 DIAGNOSIS — I509 Heart failure, unspecified: Secondary | ICD-10-CM | POA: Diagnosis not present

## 2020-07-09 DIAGNOSIS — N189 Chronic kidney disease, unspecified: Secondary | ICD-10-CM | POA: Diagnosis not present

## 2020-07-09 DIAGNOSIS — E1122 Type 2 diabetes mellitus with diabetic chronic kidney disease: Secondary | ICD-10-CM | POA: Diagnosis not present

## 2020-07-09 DIAGNOSIS — M810 Age-related osteoporosis without current pathological fracture: Secondary | ICD-10-CM | POA: Diagnosis not present

## 2020-07-09 DIAGNOSIS — D631 Anemia in chronic kidney disease: Secondary | ICD-10-CM | POA: Diagnosis not present

## 2020-07-19 DIAGNOSIS — D519 Vitamin B12 deficiency anemia, unspecified: Secondary | ICD-10-CM | POA: Diagnosis not present

## 2020-07-23 DIAGNOSIS — N189 Chronic kidney disease, unspecified: Secondary | ICD-10-CM | POA: Diagnosis not present

## 2020-07-23 DIAGNOSIS — D631 Anemia in chronic kidney disease: Secondary | ICD-10-CM | POA: Diagnosis not present

## 2020-07-23 DIAGNOSIS — R2243 Localized swelling, mass and lump, lower limb, bilateral: Secondary | ICD-10-CM | POA: Diagnosis not present

## 2020-07-23 DIAGNOSIS — I509 Heart failure, unspecified: Secondary | ICD-10-CM | POA: Diagnosis not present

## 2020-07-23 DIAGNOSIS — M810 Age-related osteoporosis without current pathological fracture: Secondary | ICD-10-CM | POA: Diagnosis not present

## 2020-07-23 DIAGNOSIS — E1122 Type 2 diabetes mellitus with diabetic chronic kidney disease: Secondary | ICD-10-CM | POA: Diagnosis not present
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
## 2020-07-29 ENCOUNTER — Other Ambulatory Visit: Payer: Self-pay

## 2020-07-29 ENCOUNTER — Encounter: Payer: Self-pay | Admitting: Cardiology

## 2020-07-29 ENCOUNTER — Ambulatory Visit (INDEPENDENT_AMBULATORY_CARE_PROVIDER_SITE_OTHER): Payer: Medicare Other | Admitting: Cardiology

## 2020-07-29 VITALS — BP 120/62 | HR 104 | Ht 63.0 in | Wt 216.0 lb

## 2020-07-29 DIAGNOSIS — I5032 Chronic diastolic (congestive) heart failure: Secondary | ICD-10-CM | POA: Diagnosis not present

## 2020-07-29 DIAGNOSIS — J449 Chronic obstructive pulmonary disease, unspecified: Secondary | ICD-10-CM | POA: Diagnosis not present

## 2020-07-29 DIAGNOSIS — R2243 Localized swelling, mass and lump, lower limb, bilateral: Secondary | ICD-10-CM | POA: Diagnosis not present

## 2020-07-29 DIAGNOSIS — E1122 Type 2 diabetes mellitus with diabetic chronic kidney disease: Secondary | ICD-10-CM | POA: Diagnosis not present

## 2020-07-29 DIAGNOSIS — E782 Mixed hyperlipidemia: Secondary | ICD-10-CM | POA: Diagnosis not present

## 2020-07-29 DIAGNOSIS — I209 Angina pectoris, unspecified: Secondary | ICD-10-CM | POA: Diagnosis not present

## 2020-07-29 DIAGNOSIS — I110 Hypertensive heart disease with heart failure: Secondary | ICD-10-CM

## 2020-07-29 DIAGNOSIS — I509 Heart failure, unspecified: Secondary | ICD-10-CM | POA: Diagnosis not present

## 2020-07-29 DIAGNOSIS — D631 Anemia in chronic kidney disease: Secondary | ICD-10-CM | POA: Diagnosis not present

## 2020-07-29 DIAGNOSIS — M810 Age-related osteoporosis without current pathological fracture: Secondary | ICD-10-CM | POA: Diagnosis not present

## 2020-07-29 DIAGNOSIS — N189 Chronic kidney disease, unspecified: Secondary | ICD-10-CM | POA: Diagnosis not present

## 2020-07-29 NOTE — Patient Instructions (Signed)
Medication Instructions:  Your physician has recommended you make the following change in your medication:  INCREASE: Torsemide 50 three times daily until your home weight is down to 210 or less.  *If you need a refill on your cardiac medications before your next appointment, please call your pharmacy*   Lab Work: None If you have labs (blood work) drawn today and your tests are completely normal, you will receive your results only by: Marland Kitchen MyChart Message (if you have MyChart) OR . A paper copy in the mail If you have any lab test that is abnormal or we need to change your treatment, we will call you to review the results.   Testing/Procedures: None   Follow-Up: At [facility], you and your health needs are our priority.  As part of our continuing mission to provide you with exceptional heart care, we have created designated Provider Care Teams.  These Care Teams include your primary Cardiologist (physician) and Advanced Practice Providers (APPs -  Physician Assistants and Nurse Practitioners) who all work together to provide you with the care you need, when you need it.  We recommend signing up for the patient portal called "MyChart".  Sign up information is provided on this After Visit Summary.  MyChart is used to connect with patients for Virtual Visits (Telemedicine).  Patients are able to view lab/test results, encounter notes, upcoming appointments, etc.  Non-urgent messages can be sent to your provider as well.   To learn more about what you can do with MyChart, go to [email or web address].    Your next appointment:   3 month(s)  The format for your next appointment:   In Person  Provider:   Norman Herrlich, MD   Other Instructions

## 2020-08-02 DIAGNOSIS — R2243 Localized swelling, mass and lump, lower limb, bilateral: Secondary | ICD-10-CM | POA: Diagnosis not present

## 2020-08-02 DIAGNOSIS — K5904 Chronic idiopathic constipation: Secondary | ICD-10-CM | POA: Diagnosis not present

## 2020-08-02 DIAGNOSIS — Z96641 Presence of right artificial hip joint: Secondary | ICD-10-CM | POA: Diagnosis not present

## 2020-08-02 DIAGNOSIS — Z7901 Long term (current) use of anticoagulants: Secondary | ICD-10-CM | POA: Diagnosis not present

## 2020-08-02 DIAGNOSIS — E785 Hyperlipidemia, unspecified: Secondary | ICD-10-CM | POA: Diagnosis not present

## 2020-08-02 DIAGNOSIS — I130 Hypertensive heart and chronic kidney disease with heart failure and stage 1 through stage 4 chronic kidney disease, or unspecified chronic kidney disease: Secondary | ICD-10-CM | POA: Diagnosis not present

## 2020-08-02 DIAGNOSIS — Z8673 Personal history of transient ischemic attack (TIA), and cerebral infarction without residual deficits: Secondary | ICD-10-CM | POA: Diagnosis not present

## 2020-08-02 DIAGNOSIS — E1140 Type 2 diabetes mellitus with diabetic neuropathy, unspecified: Secondary | ICD-10-CM | POA: Diagnosis not present

## 2020-08-02 DIAGNOSIS — J45909 Unspecified asthma, uncomplicated: Secondary | ICD-10-CM | POA: Diagnosis not present

## 2020-08-02 DIAGNOSIS — I509 Heart failure, unspecified: Secondary | ICD-10-CM | POA: Diagnosis not present

## 2020-08-02 DIAGNOSIS — M810 Age-related osteoporosis without current pathological fracture: Secondary | ICD-10-CM | POA: Diagnosis not present

## 2020-08-02 DIAGNOSIS — H9190 Unspecified hearing loss, unspecified ear: Secondary | ICD-10-CM | POA: Diagnosis not present

## 2020-08-02 DIAGNOSIS — K219 Gastro-esophageal reflux disease without esophagitis: Secondary | ICD-10-CM | POA: Diagnosis not present

## 2020-08-02 DIAGNOSIS — E1122 Type 2 diabetes mellitus with diabetic chronic kidney disease: Secondary | ICD-10-CM | POA: Diagnosis not present

## 2020-08-02 DIAGNOSIS — D631 Anemia in chronic kidney disease: Secondary | ICD-10-CM | POA: Diagnosis not present

## 2020-08-02 DIAGNOSIS — N189 Chronic kidney disease, unspecified: Secondary | ICD-10-CM | POA: Diagnosis not present

## 2020-08-06 DIAGNOSIS — D631 Anemia in chronic kidney disease: Secondary | ICD-10-CM | POA: Diagnosis not present

## 2020-08-06 DIAGNOSIS — I509 Heart failure, unspecified: Secondary | ICD-10-CM | POA: Diagnosis not present

## 2020-08-06 DIAGNOSIS — R2243 Localized swelling, mass and lump, lower limb, bilateral: Secondary | ICD-10-CM | POA: Diagnosis not present

## 2020-08-06 DIAGNOSIS — M810 Age-related osteoporosis without current pathological fracture: Secondary | ICD-10-CM | POA: Diagnosis not present

## 2020-08-06 DIAGNOSIS — E1122 Type 2 diabetes mellitus with diabetic chronic kidney disease: Secondary | ICD-10-CM | POA: Diagnosis not present

## 2020-08-06 DIAGNOSIS — N189 Chronic kidney disease, unspecified: Secondary | ICD-10-CM | POA: Diagnosis not present

## 2020-08-12 DIAGNOSIS — R2243 Localized swelling, mass and lump, lower limb, bilateral: Secondary | ICD-10-CM | POA: Diagnosis not present

## 2020-08-12 DIAGNOSIS — E1122 Type 2 diabetes mellitus with diabetic chronic kidney disease: Secondary | ICD-10-CM | POA: Diagnosis not present

## 2020-08-12 DIAGNOSIS — N189 Chronic kidney disease, unspecified: Secondary | ICD-10-CM | POA: Diagnosis not present

## 2020-08-12 DIAGNOSIS — M810 Age-related osteoporosis without current pathological fracture: Secondary | ICD-10-CM | POA: Diagnosis not present

## 2020-08-12 DIAGNOSIS — I509 Heart failure, unspecified: Secondary | ICD-10-CM | POA: Diagnosis not present

## 2020-08-12 DIAGNOSIS — D631 Anemia in chronic kidney disease: Secondary | ICD-10-CM | POA: Diagnosis not present

## 2020-08-13 DIAGNOSIS — Z794 Long term (current) use of insulin: Secondary | ICD-10-CM | POA: Diagnosis not present

## 2020-08-13 DIAGNOSIS — I509 Heart failure, unspecified: Secondary | ICD-10-CM | POA: Diagnosis not present

## 2020-08-13 DIAGNOSIS — H35351 Cystoid macular degeneration, right eye: Secondary | ICD-10-CM | POA: Diagnosis not present

## 2020-08-13 DIAGNOSIS — D631 Anemia in chronic kidney disease: Secondary | ICD-10-CM | POA: Diagnosis not present

## 2020-08-13 DIAGNOSIS — M810 Age-related osteoporosis without current pathological fracture: Secondary | ICD-10-CM | POA: Diagnosis not present

## 2020-08-13 DIAGNOSIS — E119 Type 2 diabetes mellitus without complications: Secondary | ICD-10-CM | POA: Diagnosis not present

## 2020-08-13 DIAGNOSIS — E1122 Type 2 diabetes mellitus with diabetic chronic kidney disease: Secondary | ICD-10-CM | POA: Diagnosis not present

## 2020-08-13 DIAGNOSIS — N189 Chronic kidney disease, unspecified: Secondary | ICD-10-CM | POA: Diagnosis not present

## 2020-08-13 DIAGNOSIS — R2243 Localized swelling, mass and lump, lower limb, bilateral: Secondary | ICD-10-CM | POA: Diagnosis not present

## 2020-08-13 DIAGNOSIS — Z7984 Long term (current) use of oral hypoglycemic drugs: Secondary | ICD-10-CM | POA: Diagnosis not present

## 2020-08-17 DIAGNOSIS — M810 Age-related osteoporosis without current pathological fracture: Secondary | ICD-10-CM | POA: Diagnosis not present

## 2020-08-17 DIAGNOSIS — N189 Chronic kidney disease, unspecified: Secondary | ICD-10-CM | POA: Diagnosis not present

## 2020-08-17 DIAGNOSIS — R2243 Localized swelling, mass and lump, lower limb, bilateral: Secondary | ICD-10-CM | POA: Diagnosis not present

## 2020-08-17 DIAGNOSIS — I509 Heart failure, unspecified: Secondary | ICD-10-CM | POA: Diagnosis not present

## 2020-08-17 DIAGNOSIS — D631 Anemia in chronic kidney disease: Secondary | ICD-10-CM | POA: Diagnosis not present

## 2020-08-17 DIAGNOSIS — E1122 Type 2 diabetes mellitus with diabetic chronic kidney disease: Secondary | ICD-10-CM | POA: Diagnosis not present

## 2020-08-24 DIAGNOSIS — D519 Vitamin B12 deficiency anemia, unspecified: Secondary | ICD-10-CM | POA: Diagnosis not present

## 2020-08-27 DIAGNOSIS — E1122 Type 2 diabetes mellitus with diabetic chronic kidney disease: Secondary | ICD-10-CM | POA: Diagnosis not present

## 2020-08-27 DIAGNOSIS — R2243 Localized swelling, mass and lump, lower limb, bilateral: Secondary | ICD-10-CM | POA: Diagnosis not present

## 2020-08-27 DIAGNOSIS — M810 Age-related osteoporosis without current pathological fracture: Secondary | ICD-10-CM | POA: Diagnosis not present

## 2020-08-27 DIAGNOSIS — D631 Anemia in chronic kidney disease: Secondary | ICD-10-CM | POA: Diagnosis not present

## 2020-08-27 DIAGNOSIS — I509 Heart failure, unspecified: Secondary | ICD-10-CM | POA: Diagnosis not present

## 2020-08-27 DIAGNOSIS — N189 Chronic kidney disease, unspecified: Secondary | ICD-10-CM | POA: Diagnosis not present

## 2020-09-07 DIAGNOSIS — L602 Onychogryphosis: Secondary | ICD-10-CM

## 2020-09-07 HISTORY — DX: Onychogryphosis: L60.2

## 2020-09-15 DIAGNOSIS — E1142 Type 2 diabetes mellitus with diabetic polyneuropathy: Secondary | ICD-10-CM | POA: Diagnosis not present

## 2020-09-15 DIAGNOSIS — D519 Vitamin B12 deficiency anemia, unspecified: Secondary | ICD-10-CM | POA: Diagnosis not present

## 2020-09-15 DIAGNOSIS — Z1231 Encounter for screening mammogram for malignant neoplasm of breast: Secondary | ICD-10-CM | POA: Diagnosis not present

## 2020-09-15 DIAGNOSIS — I110 Hypertensive heart disease with heart failure: Secondary | ICD-10-CM | POA: Diagnosis not present

## 2020-09-15 DIAGNOSIS — I5032 Chronic diastolic (congestive) heart failure: Secondary | ICD-10-CM | POA: Diagnosis not present

## 2020-09-15 DIAGNOSIS — I6990 Unspecified sequelae of unspecified cerebrovascular disease: Secondary | ICD-10-CM | POA: Diagnosis not present

## 2020-09-15 DIAGNOSIS — M810 Age-related osteoporosis without current pathological fracture: Secondary | ICD-10-CM | POA: Diagnosis not present

## 2020-09-15 DIAGNOSIS — E1165 Type 2 diabetes mellitus with hyperglycemia: Secondary | ICD-10-CM | POA: Diagnosis not present

## 2020-09-15 DIAGNOSIS — I5030 Unspecified diastolic (congestive) heart failure: Secondary | ICD-10-CM | POA: Diagnosis not present

## 2020-09-15 DIAGNOSIS — R399 Unspecified symptoms and signs involving the genitourinary system: Secondary | ICD-10-CM | POA: Diagnosis not present

## 2020-09-15 DIAGNOSIS — E785 Hyperlipidemia, unspecified: Secondary | ICD-10-CM | POA: Diagnosis not present

## 2020-09-15 DIAGNOSIS — Z139 Encounter for screening, unspecified: Secondary | ICD-10-CM | POA: Diagnosis not present

## 2020-09-24 DIAGNOSIS — D519 Vitamin B12 deficiency anemia, unspecified: Secondary | ICD-10-CM | POA: Diagnosis not present

## 2020-10-27 DIAGNOSIS — D519 Vitamin B12 deficiency anemia, unspecified: Secondary | ICD-10-CM | POA: Diagnosis not present
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
## 2020-11-04 ENCOUNTER — Other Ambulatory Visit: Payer: Self-pay

## 2020-11-04 ENCOUNTER — Ambulatory Visit (INDEPENDENT_AMBULATORY_CARE_PROVIDER_SITE_OTHER): Payer: Medicare Other | Admitting: Cardiology

## 2020-11-04 ENCOUNTER — Encounter: Payer: Self-pay | Admitting: Cardiology

## 2020-11-04 VITALS — BP 152/78 | HR 104 | Ht 63.0 in | Wt 216.0 lb

## 2020-11-04 DIAGNOSIS — R0602 Shortness of breath: Secondary | ICD-10-CM

## 2020-11-04 DIAGNOSIS — E782 Mixed hyperlipidemia: Secondary | ICD-10-CM | POA: Diagnosis not present

## 2020-11-04 DIAGNOSIS — I209 Angina pectoris, unspecified: Secondary | ICD-10-CM | POA: Diagnosis not present

## 2020-11-04 DIAGNOSIS — J449 Chronic obstructive pulmonary disease, unspecified: Secondary | ICD-10-CM

## 2020-11-04 DIAGNOSIS — I5032 Chronic diastolic (congestive) heart failure: Secondary | ICD-10-CM | POA: Diagnosis not present

## 2020-11-04 DIAGNOSIS — I110 Hypertensive heart disease with heart failure: Secondary | ICD-10-CM

## 2020-11-04 MED ORDER — METOPROLOL TARTRATE 100 MG PO TABS: 100.0000 mg | ORAL_TABLET | Freq: Once | ORAL | 0 refills | Status: DC

## 2020-11-04 NOTE — Patient Instructions (Addendum)
Medication Instructions:  Your physician has recommended you make the following change in your medication:  STOP: Gabapentin   *If you need a refill on your cardiac medications before your next appointment, please call your pharmacy*   Lab Work: Your physician recommends that you return for lab work in: TODAY BMP, ProBNP If you have labs (blood work) drawn today and your tests are completely normal, you will receive your results only by: MyChart Message (if you have MyChart) OR A paper copy in the mail If you have any lab test that is abnormal or we need to change your treatment, we will call you to review the results.   Testing/Procedures:   Your cardiac CT will be scheduled at the below location:   [facility] [address] [place], [place] [address] [phone]  If scheduled at [facility], please arrive at the [facility] main entrance (entrance A) of [facility] 30 minutes prior to test start time. Proceed to the [facility] Radiology Department (first floor) to check-in and test prep.  Please follow these instructions carefully (unless otherwise directed):  On the Night Before the Test: Be sure to Drink plenty of water. Do not consume any caffeinated/decaffeinated beverages or chocolate 12 hours prior to your test. Do not take any antihistamines 12 hours prior to your test.  On the Day of the Test: Drink plenty of water until 1 hour prior to the test. Do not eat any food 4 hours prior to the test. You may take your regular medications prior to the test.  Take metoprolol (Lopressor) two hours prior to test. FEMALES- please wear underwire-free bra if available      After the Test: Drink plenty of water. After receiving IV contrast, you may experience a mild flushed feeling. This is normal. On occasion, you may experience a mild rash up to 24 hours after the test. This is not dangerous. If this occurs, you can take Benadryl 25 mg and  increase your fluid intake. If you experience trouble breathing, this can be serious. If it is severe call 911 IMMEDIATELY. If it is mild, please call our office. If you take any of these medications: Glipizide/Metformin, Avandament, Glucavance, please do not take 48 hours after completing test unless otherwise instructed.   Once we have confirmed authorization from your insurance company, we will call you to set up a date and time for your test. Based on how quickly your insurance processes prior authorizations requests, please allow up to 4 weeks to be contacted for scheduling your Cardiac CT appointment. Be advised that routine Cardiac CT appointments could be scheduled as many as 8 weeks after your provider has ordered it.  For non-scheduling related questions, please contact the cardiac imaging nurse navigator should you have any questions/concerns: Rockwell Alexandria, Cardiac Imaging Nurse Navigator Larey Brick, Cardiac Imaging Nurse Navigator [facility] Direct Office Dial: [phone]   For scheduling needs, including cancellations and rescheduling, please call [place], [phone].    Follow-Up: At [facility], you and your health needs are our priority.  As part of our continuing mission to provide you with exceptional heart care, we have created designated Provider Care Teams.  These Care Teams include your primary Cardiologist (physician) and Advanced Practice Providers (APPs -  Physician Assistants and Nurse Practitioners) who all work together to provide you with the care you need, when you need it.  We recommend signing up for the patient portal called "MyChart".  Sign up information is provided on this After Visit Summary.  MyChart is used to connect with patients for Virtual Visits (Telemedicine).  Patients are able to view lab/test results, encounter notes, upcoming appointments, etc.  Non-urgent messages can be sent to your provider as well.   To  learn more about what you can do with MyChart, go to [email or web address].    Your next appointment:   3 month(s)  The format for your next appointment:   In Person  Provider:   Norman Herrlich, MD   Other Instructions

## 2020-11-05 LAB — BASIC METABOLIC PANEL
BUN/Creatinine Ratio: 11 — ABNORMAL LOW (ref 12–28)
BUN: 10 mg/dL (ref 8–27)
CO2: 25 mmol/L (ref 20–29)
Calcium: 9.2 mg/dL (ref 8.7–10.3)
Chloride: 98 mmol/L (ref 96–106)
Creatinine, Ser: 0.93 mg/dL (ref 0.57–1.00)
Glucose: 199 mg/dL — ABNORMAL HIGH (ref 65–99)
Potassium: 5.3 mmol/L — ABNORMAL HIGH (ref 3.5–5.2)
Sodium: 137 mmol/L (ref 134–144)
eGFR: 64 mL/min/{1.73_m2} (ref >59)

## 2020-11-05 LAB — PRO B NATRIURETIC PEPTIDE: NT-Pro BNP: 350 pg/mL — ABNORMAL HIGH (ref 0–301)

## 2020-11-09 ENCOUNTER — Telehealth (HOSPITAL_COMMUNITY): Payer: Self-pay | Admitting: Emergency Medicine

## 2020-11-09 NOTE — Telephone Encounter (Signed)
Reaching out to patient to offer assistance regarding upcoming cardiac imaging study; pt verbalizes understanding of appt date/time, parking situation and where to check in, pre-test NPO status and medications ordered, and verified current allergies; name and call back number provided for further questions should they arise Rockwell Alexandria RN Navigator Cardiac Imaging [facility] [phone] office [phone] cell  Difficult IV start Denies claustro 100mg  metoprolol tartrate 2 hr prior to scan

## 2020-11-10 ENCOUNTER — Other Ambulatory Visit: Payer: Self-pay

## 2020-11-10 ENCOUNTER — Ambulatory Visit (HOSPITAL_COMMUNITY)
Admission: RE | Admit: 2020-11-10 | Discharge: 2020-11-10 | Disposition: A | Discharge status: Home / Self Care | Payer: Medicare Other | Source: Ambulatory Visit | Attending: Cardiology | Admitting: Cardiology

## 2020-11-10 DIAGNOSIS — I209 Angina pectoris, unspecified: Secondary | ICD-10-CM | POA: Insufficient documentation

## 2020-11-10 DIAGNOSIS — R0602 Shortness of breath: Secondary | ICD-10-CM | POA: Diagnosis not present

## 2020-11-10 DIAGNOSIS — I110 Hypertensive heart disease with heart failure: Secondary | ICD-10-CM | POA: Diagnosis not present

## 2020-11-10 DIAGNOSIS — E782 Mixed hyperlipidemia: Secondary | ICD-10-CM | POA: Insufficient documentation

## 2020-11-10 DIAGNOSIS — J449 Chronic obstructive pulmonary disease, unspecified: Secondary | ICD-10-CM | POA: Diagnosis not present

## 2020-11-10 DIAGNOSIS — I5032 Chronic diastolic (congestive) heart failure: Secondary | ICD-10-CM | POA: Diagnosis not present

## 2020-11-10 DIAGNOSIS — I2510 Atherosclerotic heart disease of native coronary artery without angina pectoris: Secondary | ICD-10-CM | POA: Diagnosis not present

## 2020-11-10 MED ORDER — NITROGLYCERIN 0.4 MG SL SUBL: 0.8000 mg | SUBLINGUAL_TABLET | Freq: Once | SUBLINGUAL | Status: AC

## 2020-11-10 MED ORDER — NITROGLYCERIN 0.4 MG SL SUBL
SUBLINGUAL_TABLET | SUBLINGUAL | Status: AC
  Administered 2020-11-10: 15:00:00 0.8 mg via SUBLINGUAL
  Filled 2020-11-10: qty 2

## 2020-11-10 MED ORDER — IOHEXOL 350 MG/ML SOLN
100.0000 mL | Freq: Once | INTRAVENOUS | Status: AC | PRN
  Administered 2020-11-10: 15:00:00 100 mL via INTRAVENOUS

## 2020-11-11 DIAGNOSIS — Z1231 Encounter for screening mammogram for malignant neoplasm of breast: Secondary | ICD-10-CM | POA: Diagnosis not present

## 2020-11-12 ENCOUNTER — Other Ambulatory Visit: Payer: Self-pay | Admitting: Cardiology

## 2020-11-12 ENCOUNTER — Ambulatory Visit (HOSPITAL_COMMUNITY)
Admission: RE | Admit: 2020-11-12 | Discharge: 2020-11-12 | Disposition: A | Discharge status: Home / Self Care | Payer: Medicare Other | Source: Ambulatory Visit | Attending: Cardiology | Admitting: Cardiology

## 2020-11-12 DIAGNOSIS — R931 Abnormal findings on diagnostic imaging of heart and coronary circulation: Secondary | ICD-10-CM

## 2020-11-12 DIAGNOSIS — I2510 Atherosclerotic heart disease of native coronary artery without angina pectoris: Secondary | ICD-10-CM | POA: Diagnosis not present

## 2020-11-15 ENCOUNTER — Other Ambulatory Visit: Payer: Self-pay
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
## 2020-11-16 ENCOUNTER — Encounter: Payer: Self-pay | Admitting: Cardiology

## 2020-11-16 ENCOUNTER — Ambulatory Visit (INDEPENDENT_AMBULATORY_CARE_PROVIDER_SITE_OTHER): Payer: Medicare Other | Admitting: Cardiology

## 2020-11-16 ENCOUNTER — Other Ambulatory Visit: Payer: Self-pay

## 2020-11-16 VITALS — BP 160/90 | HR 96 | Ht 63.0 in | Wt 216.0 lb

## 2020-11-16 DIAGNOSIS — R931 Abnormal findings on diagnostic imaging of heart and coronary circulation: Secondary | ICD-10-CM

## 2020-11-16 DIAGNOSIS — I25118 Atherosclerotic heart disease of native coronary artery with other forms of angina pectoris: Secondary | ICD-10-CM | POA: Diagnosis not present

## 2020-11-16 DIAGNOSIS — J449 Chronic obstructive pulmonary disease, unspecified: Secondary | ICD-10-CM

## 2020-11-16 DIAGNOSIS — I110 Hypertensive heart disease with heart failure: Secondary | ICD-10-CM | POA: Diagnosis not present

## 2020-11-16 DIAGNOSIS — E782 Mixed hyperlipidemia: Secondary | ICD-10-CM

## 2020-11-16 MED ORDER — RANOLAZINE ER 500 MG PO TB12: 500.0000 mg | ORAL_TABLET | Freq: Two times a day (BID) | ORAL | 3 refills | Status: DC

## 2020-11-16 MED ORDER — ISOSORBIDE MONONITRATE ER 30 MG PO TB24: 30.0000 mg | ORAL_TABLET | Freq: Every day | ORAL | 12 refills | Status: DC

## 2020-11-16 MED ORDER — ISOSORBIDE MONONITRATE ER 30 MG PO TB24: 30.0000 mg | ORAL_TABLET | Freq: Every day | ORAL | 3 refills | Status: DC

## 2020-11-16 MED ORDER — RANOLAZINE ER 500 MG PO TB12: 500.0000 mg | ORAL_TABLET | Freq: Two times a day (BID) | ORAL | 12 refills | Status: DC

## 2020-11-16 NOTE — Patient Instructions (Signed)
Medication Instructions:  Your physician has recommended you make the following change in your medication:   Start Imdur 30 mg daily. Start Ranexa 500 mg twice daily.  *If you need a refill on your cardiac medications before your next appointment, please call your pharmacy*   Lab Work: None ordered If you have labs (blood work) drawn today and your tests are completely normal, you will receive your results only by: MyChart Message (if you have MyChart) OR A paper copy in the mail If you have any lab test that is abnormal or we need to change your treatment, we will call you to review the results.   Testing/Procedures: None ordered   Follow-Up: At [facility], you and your health needs are our priority.  As part of our continuing mission to provide you with exceptional heart care, we have created designated Provider Care Teams.  These Care Teams include your primary Cardiologist (physician) and Advanced Practice Providers (APPs -  Physician Assistants and Nurse Practitioners) who all work together to provide you with the care you need, when you need it.  We recommend signing up for the patient portal called "MyChart".  Sign up information is provided on this After Visit Summary.  MyChart is used to connect with patients for Virtual Visits (Telemedicine).  Patients are able to view lab/test results, encounter notes, upcoming appointments, etc.  Non-urgent messages can be sent to your provider as well.   To learn more about what you can do with MyChart, go to [email or web address].    Your next appointment:   As scheduled  The format for your next appointment:   In Person  Provider:   Norman Herrlich, MD   Other Instructions NA

## 2020-11-16 NOTE — Addendum Note (Signed)
Addended by: Eleonore Chiquito on: 11/16/2020 10:11 AM   Modules accepted: Orders

## 2020-11-29 DIAGNOSIS — D519 Vitamin B12 deficiency anemia, unspecified: Secondary | ICD-10-CM | POA: Diagnosis not present
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
## 2020-12-15 ENCOUNTER — Other Ambulatory Visit: Payer: Self-pay

## 2020-12-15 ENCOUNTER — Encounter: Payer: Self-pay | Admitting: Cardiology

## 2020-12-15 ENCOUNTER — Ambulatory Visit (INDEPENDENT_AMBULATORY_CARE_PROVIDER_SITE_OTHER): Payer: Medicare Other | Admitting: Cardiology

## 2020-12-15 VITALS — BP 139/77 | HR 98 | Ht 63.0 in | Wt 210.0 lb

## 2020-12-15 DIAGNOSIS — I110 Hypertensive heart disease with heart failure: Secondary | ICD-10-CM | POA: Diagnosis not present

## 2020-12-15 DIAGNOSIS — I25118 Atherosclerotic heart disease of native coronary artery with other forms of angina pectoris: Secondary | ICD-10-CM

## 2020-12-15 DIAGNOSIS — E782 Mixed hyperlipidemia: Secondary | ICD-10-CM

## 2020-12-15 MED ORDER — METOLAZONE 2.5 MG PO TABS: 2.5000 mg | ORAL_TABLET | ORAL | 3 refills | Status: DC

## 2020-12-15 NOTE — Patient Instructions (Signed)
Medication Instructions:  Your physician has recommended you make the following change in your medication:  START: Metolazone 2.5 mg take one tablet by mouth once a week. Please take this 30 minutes prior to your torsemide.  *If you need a refill on your cardiac medications before your next appointment, please call your pharmacy*   Lab Work: None If you have labs (blood work) drawn today and your tests are completely normal, you will receive your results only by: MyChart Message (if you have MyChart) OR A paper copy in the mail If you have any lab test that is abnormal or we need to change your treatment, we will call you to review the results.   Testing/Procedures: None   Follow-Up: At [facility], you and your health needs are our priority.  As part of our continuing mission to provide you with exceptional heart care, we have created designated Provider Care Teams.  These Care Teams include your primary Cardiologist (physician) and Advanced Practice Providers (APPs -  Physician Assistants and Nurse Practitioners) who all work together to provide you with the care you need, when you need it.  We recommend signing up for the patient portal called "MyChart".  Sign up information is provided on this After Visit Summary.  MyChart is used to connect with patients for Virtual Visits (Telemedicine).  Patients are able to view lab/test results, encounter notes, upcoming appointments, etc.  Non-urgent messages can be sent to your provider as well.   To learn more about what you can do with MyChart, go to [email or web address].    Your next appointment:   6 week(s)  The format for your next appointment:   In Person  Provider:   Norman Herrlich, MD   Other Instructions

## 2020-12-16 DIAGNOSIS — L602 Onychogryphosis: Secondary | ICD-10-CM

## 2020-12-16 HISTORY — DX: Onychogryphosis: L60.2

## 2020-12-17 DIAGNOSIS — E1142 Type 2 diabetes mellitus with diabetic polyneuropathy: Secondary | ICD-10-CM | POA: Diagnosis not present

## 2020-12-17 DIAGNOSIS — I5032 Chronic diastolic (congestive) heart failure: Secondary | ICD-10-CM | POA: Diagnosis not present

## 2020-12-17 DIAGNOSIS — I5030 Unspecified diastolic (congestive) heart failure: Secondary | ICD-10-CM | POA: Diagnosis not present

## 2020-12-17 DIAGNOSIS — D519 Vitamin B12 deficiency anemia, unspecified: Secondary | ICD-10-CM | POA: Diagnosis not present

## 2020-12-17 DIAGNOSIS — E785 Hyperlipidemia, unspecified: Secondary | ICD-10-CM | POA: Diagnosis not present

## 2020-12-17 DIAGNOSIS — M810 Age-related osteoporosis without current pathological fracture: Secondary | ICD-10-CM | POA: Diagnosis not present

## 2020-12-17 DIAGNOSIS — I110 Hypertensive heart disease with heart failure: Secondary | ICD-10-CM | POA: Diagnosis not present

## 2020-12-17 DIAGNOSIS — G8191 Hemiplegia, unspecified affecting right dominant side: Secondary | ICD-10-CM | POA: Diagnosis not present

## 2020-12-31 DIAGNOSIS — D519 Vitamin B12 deficiency anemia, unspecified: Secondary | ICD-10-CM | POA: Diagnosis not present

## 2021-01-20 DIAGNOSIS — R928 Other abnormal and inconclusive findings on diagnostic imaging of breast: Secondary | ICD-10-CM | POA: Diagnosis not present

## 2021-01-20 DIAGNOSIS — R922 Inconclusive mammogram: Secondary | ICD-10-CM | POA: Diagnosis not present

## 2021-01-31 DIAGNOSIS — D519 Vitamin B12 deficiency anemia, unspecified: Secondary | ICD-10-CM | POA: Diagnosis not present

## 2021-02-09 ENCOUNTER — Ambulatory Visit: Payer: Medicare Other | Admitting: Cardiology

## 2021-02-10 ENCOUNTER — Other Ambulatory Visit: Payer: Self-pay

## 2021-02-10 ENCOUNTER — Encounter: Payer: Self-pay | Admitting: Cardiology

## 2021-02-10 ENCOUNTER — Ambulatory Visit (INDEPENDENT_AMBULATORY_CARE_PROVIDER_SITE_OTHER): Payer: Medicare Other | Admitting: Cardiology

## 2021-02-10 VITALS — HR 107 | Ht 63.0 in

## 2021-02-10 DIAGNOSIS — I110 Hypertensive heart disease with heart failure: Secondary | ICD-10-CM | POA: Diagnosis not present

## 2021-02-10 DIAGNOSIS — J449 Chronic obstructive pulmonary disease, unspecified: Secondary | ICD-10-CM | POA: Diagnosis not present

## 2021-02-10 DIAGNOSIS — E782 Mixed hyperlipidemia: Secondary | ICD-10-CM | POA: Diagnosis not present

## 2021-02-10 DIAGNOSIS — I25118 Atherosclerotic heart disease of native coronary artery with other forms of angina pectoris: Secondary | ICD-10-CM | POA: Diagnosis not present

## 2021-02-10 MED ORDER — NITROGLYCERIN 0.4 MG SL SUBL: 0.4000 mg | SUBLINGUAL_TABLET | SUBLINGUAL | 3 refills | Status: DC | PRN

## 2021-02-10 MED ORDER — METOLAZONE 2.5 MG PO TABS: 2.5000 mg | ORAL_TABLET | ORAL | 3 refills | Status: DC

## 2021-02-10 NOTE — Patient Instructions (Signed)
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
## 2021-02-14 DIAGNOSIS — H25813 Combined forms of age-related cataract, bilateral: Secondary | ICD-10-CM | POA: Diagnosis not present

## 2021-02-14 DIAGNOSIS — H524 Presbyopia: Secondary | ICD-10-CM | POA: Diagnosis not present

## 2021-02-14 DIAGNOSIS — Z7984 Long term (current) use of oral hypoglycemic drugs: Secondary | ICD-10-CM | POA: Diagnosis not present

## 2021-02-14 DIAGNOSIS — Z794 Long term (current) use of insulin: Secondary | ICD-10-CM | POA: Diagnosis not present

## 2021-02-14 DIAGNOSIS — E113213 Type 2 diabetes mellitus with mild nonproliferative diabetic retinopathy with macular edema, bilateral: Secondary | ICD-10-CM | POA: Diagnosis not present

## 2021-02-14 DIAGNOSIS — E119 Type 2 diabetes mellitus without complications: Secondary | ICD-10-CM | POA: Diagnosis not present

## 2021-02-14 DIAGNOSIS — H5203 Hypermetropia, bilateral: Secondary | ICD-10-CM | POA: Diagnosis not present

## 2021-02-14 DIAGNOSIS — H52223 Regular astigmatism, bilateral: Secondary | ICD-10-CM | POA: Diagnosis not present

## 2021-02-14 DIAGNOSIS — I10 Essential (primary) hypertension: Secondary | ICD-10-CM | POA: Diagnosis not present

## 2021-02-14 DIAGNOSIS — H3500 Unspecified background retinopathy: Secondary | ICD-10-CM | POA: Diagnosis not present

## 2021-03-07 DIAGNOSIS — D519 Vitamin B12 deficiency anemia, unspecified: Secondary | ICD-10-CM | POA: Diagnosis not present

## 2021-03-08 DIAGNOSIS — L602 Onychogryphosis: Secondary | ICD-10-CM | POA: Diagnosis not present

## 2021-03-22 DIAGNOSIS — M810 Age-related osteoporosis without current pathological fracture: Secondary | ICD-10-CM | POA: Diagnosis not present

## 2021-03-22 DIAGNOSIS — G8191 Hemiplegia, unspecified affecting right dominant side: Secondary | ICD-10-CM | POA: Diagnosis not present

## 2021-03-22 DIAGNOSIS — Z1331 Encounter for screening for depression: Secondary | ICD-10-CM | POA: Diagnosis not present

## 2021-03-22 DIAGNOSIS — I110 Hypertensive heart disease with heart failure: Secondary | ICD-10-CM | POA: Diagnosis not present

## 2021-03-22 DIAGNOSIS — G2581 Restless legs syndrome: Secondary | ICD-10-CM | POA: Diagnosis not present

## 2021-03-22 DIAGNOSIS — D519 Vitamin B12 deficiency anemia, unspecified: Secondary | ICD-10-CM | POA: Diagnosis not present

## 2021-03-22 DIAGNOSIS — I5030 Unspecified diastolic (congestive) heart failure: Secondary | ICD-10-CM | POA: Diagnosis not present

## 2021-03-22 DIAGNOSIS — E1142 Type 2 diabetes mellitus with diabetic polyneuropathy: Secondary | ICD-10-CM | POA: Diagnosis not present

## 2021-03-22 DIAGNOSIS — I5032 Chronic diastolic (congestive) heart failure: Secondary | ICD-10-CM | POA: Diagnosis not present

## 2021-03-22 DIAGNOSIS — Z9181 History of falling: Secondary | ICD-10-CM | POA: Diagnosis not present

## 2021-03-22 DIAGNOSIS — E785 Hyperlipidemia, unspecified: Secondary | ICD-10-CM | POA: Diagnosis not present

## 2021-03-22 DIAGNOSIS — Z298 Encounter for other specified prophylactic measures: Secondary | ICD-10-CM | POA: Diagnosis not present

## 2021-05-24 DIAGNOSIS — L602 Onychogryphosis: Secondary | ICD-10-CM | POA: Diagnosis not present

## 2021-07-04 DIAGNOSIS — D519 Vitamin B12 deficiency anemia, unspecified: Secondary | ICD-10-CM | POA: Diagnosis not present

## 2021-07-04 DIAGNOSIS — I5032 Chronic diastolic (congestive) heart failure: Secondary | ICD-10-CM | POA: Diagnosis not present

## 2021-07-04 DIAGNOSIS — G8191 Hemiplegia, unspecified affecting right dominant side: Secondary | ICD-10-CM | POA: Diagnosis not present

## 2021-07-04 DIAGNOSIS — I5030 Unspecified diastolic (congestive) heart failure: Secondary | ICD-10-CM | POA: Diagnosis not present

## 2021-07-04 DIAGNOSIS — E1142 Type 2 diabetes mellitus with diabetic polyneuropathy: Secondary | ICD-10-CM | POA: Diagnosis not present

## 2021-07-04 DIAGNOSIS — M810 Age-related osteoporosis without current pathological fracture: Secondary | ICD-10-CM | POA: Diagnosis not present

## 2021-07-04 DIAGNOSIS — F3341 Major depressive disorder, recurrent, in partial remission: Secondary | ICD-10-CM | POA: Diagnosis not present

## 2021-07-04 DIAGNOSIS — I110 Hypertensive heart disease with heart failure: Secondary | ICD-10-CM | POA: Diagnosis not present

## 2021-07-04 DIAGNOSIS — E785 Hyperlipidemia, unspecified: Secondary | ICD-10-CM | POA: Diagnosis not present

## 2021-07-04 DIAGNOSIS — G2581 Restless legs syndrome: Secondary | ICD-10-CM | POA: Diagnosis not present

## 2021-07-13 ENCOUNTER — Ambulatory Visit (INDEPENDENT_AMBULATORY_CARE_PROVIDER_SITE_OTHER): Payer: Medicare Other | Admitting: Cardiology

## 2021-07-13 ENCOUNTER — Encounter: Payer: Self-pay | Admitting: Cardiology

## 2021-07-13 ENCOUNTER — Other Ambulatory Visit: Payer: Self-pay

## 2021-07-13 VITALS — BP 148/80 | HR 100 | Ht 63.0 in | Wt 197.8 lb

## 2021-07-13 DIAGNOSIS — I25118 Atherosclerotic heart disease of native coronary artery with other forms of angina pectoris: Secondary | ICD-10-CM

## 2021-07-13 DIAGNOSIS — J449 Chronic obstructive pulmonary disease, unspecified: Secondary | ICD-10-CM

## 2021-07-13 DIAGNOSIS — I110 Hypertensive heart disease with heart failure: Secondary | ICD-10-CM

## 2021-07-13 DIAGNOSIS — E782 Mixed hyperlipidemia: Secondary | ICD-10-CM | POA: Diagnosis not present

## 2021-07-13 NOTE — Patient Instructions (Signed)
Medication Instructions:  ?Your physician has recommended you make the following change in your medication: Take a 2nd Metolazone if needed for fluid and BP ? ?*If you need a refill on your cardiac medications before your next appointment, please call your pharmacy* ? ? ?Lab Work: ?NONE ?If you have labs (blood work) drawn today and your tests are completely normal, you will receive your results only by: ?MyChart Message (if you have MyChart) OR ?A paper copy in the mail ?If you have any lab test that is abnormal or we need to change your treatment, we will call you to review the results. ? ? ?Testing/Procedures: ?NONE ? ? ?Follow-Up: ?At [facility], you and your health needs are our priority.  As part of our continuing mission to provide you with exceptional heart care, we have created designated Provider Care Teams.  These Care Teams include your primary Cardiologist (physician) and Advanced Practice Providers (APPs -  Physician Assistants and Nurse Practitioners) who all work together to provide you with the care you need, when you need it. ? ?We recommend signing up for the patient portal called "MyChart".  Sign up information is provided on this After Visit Summary.  MyChart is used to connect with patients for Virtual Visits (Telemedicine).  Patients are able to view lab/test results, encounter notes, upcoming appointments, etc.  Non-urgent messages can be sent to your provider as well.   ?To learn more about what you can do with MyChart, go to [email or web address].   ? ?Your next appointment:   ?6 month(s) ? ?The format for your next appointment:   ?In Person ? ?Provider:   ?Norman Herrlich, MD  ? ? ?Other Instructions ?  ?

## 2021-07-13 NOTE — Addendum Note (Signed)
Addended by: Roosvelt Harps R on: 07/13/2021 01:39 PM ? ? Modules accepted: Orders ? ?
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
## 2021-07-14 DIAGNOSIS — Z20822 Contact with and (suspected) exposure to covid-19: Secondary | ICD-10-CM | POA: Diagnosis not present

## 2021-08-09 DIAGNOSIS — Z20822 Contact with and (suspected) exposure to covid-19: Secondary | ICD-10-CM | POA: Diagnosis not present

## 2021-08-09 DIAGNOSIS — R059 Cough, unspecified: Secondary | ICD-10-CM | POA: Diagnosis not present

## 2021-08-09 DIAGNOSIS — R051 Acute cough: Secondary | ICD-10-CM | POA: Diagnosis not present

## 2021-08-11 DIAGNOSIS — L602 Onychogryphosis: Secondary | ICD-10-CM | POA: Diagnosis not present

## 2021-08-12 DIAGNOSIS — L0390 Cellulitis, unspecified: Secondary | ICD-10-CM | POA: Diagnosis not present

## 2021-08-12 DIAGNOSIS — R609 Edema, unspecified: Secondary | ICD-10-CM | POA: Diagnosis not present

## 2021-08-15 DIAGNOSIS — L03116 Cellulitis of left lower limb: Secondary | ICD-10-CM | POA: Diagnosis not present

## 2021-08-15 DIAGNOSIS — I890 Lymphedema, not elsewhere classified: Secondary | ICD-10-CM | POA: Diagnosis not present

## 2021-08-15 DIAGNOSIS — L03115 Cellulitis of right lower limb: Secondary | ICD-10-CM | POA: Diagnosis not present

## 2021-08-16 DIAGNOSIS — L03119 Cellulitis of unspecified part of limb: Secondary | ICD-10-CM | POA: Diagnosis not present

## 2021-08-17 DIAGNOSIS — M810 Age-related osteoporosis without current pathological fracture: Secondary | ICD-10-CM | POA: Diagnosis not present

## 2021-08-17 DIAGNOSIS — Z794 Long term (current) use of insulin: Secondary | ICD-10-CM | POA: Diagnosis not present

## 2021-08-17 DIAGNOSIS — Z96641 Presence of right artificial hip joint: Secondary | ICD-10-CM | POA: Diagnosis not present

## 2021-08-17 DIAGNOSIS — Z7902 Long term (current) use of antithrombotics/antiplatelets: Secondary | ICD-10-CM | POA: Diagnosis not present

## 2021-08-17 DIAGNOSIS — I509 Heart failure, unspecified: Secondary | ICD-10-CM | POA: Diagnosis not present

## 2021-08-17 DIAGNOSIS — E7800 Pure hypercholesterolemia, unspecified: Secondary | ICD-10-CM | POA: Diagnosis not present

## 2021-08-17 DIAGNOSIS — R2243 Localized swelling, mass and lump, lower limb, bilateral: Secondary | ICD-10-CM | POA: Diagnosis not present

## 2021-08-17 DIAGNOSIS — L03115 Cellulitis of right lower limb: Secondary | ICD-10-CM | POA: Diagnosis not present

## 2021-08-17 DIAGNOSIS — Z7984 Long term (current) use of oral hypoglycemic drugs: Secondary | ICD-10-CM | POA: Diagnosis not present

## 2021-08-17 DIAGNOSIS — I252 Old myocardial infarction: Secondary | ICD-10-CM | POA: Diagnosis not present

## 2021-08-17 DIAGNOSIS — K219 Gastro-esophageal reflux disease without esophagitis: Secondary | ICD-10-CM | POA: Diagnosis not present

## 2021-08-17 DIAGNOSIS — Z90710 Acquired absence of both cervix and uterus: Secondary | ICD-10-CM | POA: Diagnosis not present

## 2021-08-17 DIAGNOSIS — G2581 Restless legs syndrome: Secondary | ICD-10-CM | POA: Diagnosis not present

## 2021-08-17 DIAGNOSIS — Z9049 Acquired absence of other specified parts of digestive tract: Secondary | ICD-10-CM | POA: Diagnosis not present

## 2021-08-17 DIAGNOSIS — E1140 Type 2 diabetes mellitus with diabetic neuropathy, unspecified: Secondary | ICD-10-CM | POA: Diagnosis not present

## 2021-08-17 DIAGNOSIS — I110 Hypertensive heart disease with heart failure: Secondary | ICD-10-CM | POA: Diagnosis not present

## 2021-08-17 DIAGNOSIS — L03116 Cellulitis of left lower limb: Secondary | ICD-10-CM | POA: Diagnosis not present

## 2021-08-17 DIAGNOSIS — E538 Deficiency of other specified B group vitamins: Secondary | ICD-10-CM | POA: Diagnosis not present

## 2021-08-17 DIAGNOSIS — Z8673 Personal history of transient ischemic attack (TIA), and cerebral infarction without residual deficits: Secondary | ICD-10-CM | POA: Diagnosis not present

## 2021-08-19 DIAGNOSIS — I509 Heart failure, unspecified: Secondary | ICD-10-CM | POA: Diagnosis not present

## 2021-08-19 DIAGNOSIS — L03115 Cellulitis of right lower limb: Secondary | ICD-10-CM | POA: Diagnosis not present

## 2021-08-19 DIAGNOSIS — I252 Old myocardial infarction: Secondary | ICD-10-CM | POA: Diagnosis not present

## 2021-08-19 DIAGNOSIS — L03116 Cellulitis of left lower limb: Secondary | ICD-10-CM | POA: Diagnosis not present

## 2021-08-19 DIAGNOSIS — I110 Hypertensive heart disease with heart failure: Secondary | ICD-10-CM | POA: Diagnosis not present

## 2021-08-19 DIAGNOSIS — Z7984 Long term (current) use of oral hypoglycemic drugs: Secondary | ICD-10-CM | POA: Diagnosis not present

## 2021-08-22 DIAGNOSIS — L03116 Cellulitis of left lower limb: Secondary | ICD-10-CM | POA: Diagnosis not present

## 2021-08-22 DIAGNOSIS — I252 Old myocardial infarction: Secondary | ICD-10-CM | POA: Diagnosis not present

## 2021-08-22 DIAGNOSIS — Z7984 Long term (current) use of oral hypoglycemic drugs: Secondary | ICD-10-CM | POA: Diagnosis not present

## 2021-08-22 DIAGNOSIS — I509 Heart failure, unspecified: Secondary | ICD-10-CM | POA: Diagnosis not present

## 2021-08-22 DIAGNOSIS — L03115 Cellulitis of right lower limb: Secondary | ICD-10-CM | POA: Diagnosis not present

## 2021-08-22 DIAGNOSIS — I110 Hypertensive heart disease with heart failure: Secondary | ICD-10-CM | POA: Diagnosis not present

## 2021-08-26 DIAGNOSIS — I110 Hypertensive heart disease with heart failure: Secondary | ICD-10-CM | POA: Diagnosis not present

## 2021-08-26 DIAGNOSIS — I509 Heart failure, unspecified: Secondary | ICD-10-CM | POA: Diagnosis not present

## 2021-08-26 DIAGNOSIS — L03116 Cellulitis of left lower limb: Secondary | ICD-10-CM | POA: Diagnosis not present

## 2021-08-26 DIAGNOSIS — L03115 Cellulitis of right lower limb: Secondary | ICD-10-CM | POA: Diagnosis not present

## 2021-08-26 DIAGNOSIS — I252 Old myocardial infarction: Secondary | ICD-10-CM | POA: Diagnosis not present

## 2021-08-26 DIAGNOSIS — Z7984 Long term (current) use of oral hypoglycemic drugs: Secondary | ICD-10-CM | POA: Diagnosis not present

## 2021-08-29 NOTE — Progress Notes (Addendum)
[name], [name] ([id number]) ?Visit Report for 08/30/2021 ?Allergy List Details ?Patient Name: Date of Service: ?[name], [name] 08/30/2021 1:15 PM ?Medical Record Number: [id number] ?Patient Account Number: [id number] ?Date of Birth/Sex: Treating RN: ?11-28-1945 (76 y.o. Female) Antonieta Iba ?Primary Care Devlin Brink: Foye Deer Other Clinician: ?Referring Jordan Pardini: ?Treating Havier Deeb/Extender: Geralyn Corwin ?Foye Deer ?Weeks in Treatment: 0 ?Allergies ?Active Allergies ?alendronate sodium ?valsartan ?Reaction: N/V ?Allergy Notes ?Electronic Signature(s) ?Signed: 08/30/2021 4:50:27 PM By: Antonieta Iba ?Previous Signature: 08/29/2021 3:42:03 PM Version By: Karl Bales EMT ?Previous Signature: 08/29/2021 8:05:31 AM Version By: Antonieta Iba ?Entered By: Antonieta Iba on 08/30/2021 14:13:23 ?-------------------------------------------------------------------------------- ?Arrival Information Details ?Patient Name: Date of Service: ?[name], [name] 08/30/2021 1:15 PM ?Medical Record Number: [id number] ?Patient Account Number: [id number] ?Date of Birth/Sex: Treating RN: ?Jan 24, 1946 (76 y.o. Female) Antonieta Iba ?Primary Care Aurie Harroun: Foye Deer Other Clinician: ?Referring Jeanny Rymer: ?Treating Rakayla Ricklefs/Extender: Geralyn Corwin ?Foye Deer ?Weeks in Treatment: 0 ?Visit Information ?Patient Arrived: Wheel Chair ?Arrival Time: 13:40 ?Accompanied By: husband ?Transfer Assistance: Manual ?Patient Identification Verified: Yes ?Secondary Verification Process Completed: Yes ?Patient Requires Transmission-Based Precautions: No ?Patient Has Alerts: Yes ?Patient Alerts: Patient on Blood Thinner ?L ABI= Non Comp ?Electronic Signature(s) ?Signed: 08/30/2021 4:50:27 PM By: Antonieta Iba ?Entered By: Antonieta Iba on 08/30/2021 14:43:33 ?-------------------------------------------------------------------------------- ?Clinic Level of Care Assessment Details ?Patient Name: Date of Service: ?[name], [name]  08/30/2021 1:15 PM ?Medical Record Number: [id number] ?Patient Account Number: [id number] ?Date of Birth/Sex: Treating RN: ?1945/11/27 (76 y.o. Female) Antonieta Iba ?Primary Care Daleena Rotter: Foye Deer Other Clinician: ?Referring Saketh Daubert: ?Treating Zalayah Pizzuto/Extender: Geralyn Corwin ?Foye Deer ?Weeks in Treatment: 0 ?Clinic Level of Care Assessment Items ?TOOL 2 Quantity Score ?X- 1 0 ?Use when only an EandM is performed on the INITIAL visit ?ASSESSMENTS - Nursing Assessment / Reassessment ?X- 1 20 ?General Physical Exam (combine w/ comprehensive assessment (listed just below) when performed on new pt. evals) ?X- 1 25 ?Comprehensive Assessment (HX, ROS, Risk Assessments, Wounds Hx, etc.) ?ASSESSMENTS - Wound and Skin A ssessment / Reassessment ?[]  - 0 ?Simple Wound Assessment / Reassessment - one wound ?[]  - 0 ?Complex Wound Assessment / Reassessment - multiple wounds ?X- 1 10 ?Dermatologic / Skin Assessment (not related to wound area) ?ASSESSMENTS - Ostomy and/or Continence Assessment and Care ?[]  - 0 ?Incontinence Assessment and Management ?[]  - 0 ?Ostomy Care Assessment and Management (repouching, etc.) ?PROCESS - Coordination of Care ?[]  - 0 ?Simple Patient / Family Education for ongoing care ?X- 1 20 ?Complex (extensive) Patient / Family Education for ongoing care ?X- 1 10 ?Staff obtains Consents, Records, T Results / Process Orders ?est ?[]  - 0 ?Staff telephones HHA, Nursing Homes / Clarify orders / etc ?[]  - 0 ?Routine Transfer to another Facility (non-emergent condition) ?[]  - 0 ?Routine Hospital Admission (non-emergent condition) ?[]  - 0 ?New Admissions / / Ordering NPWT Apligraf, etc. ?, ?[]  - 0 ?Emergency Hospital Admission (emergent condition) ?[]  - 0 ?Simple Discharge Coordination ?[]  - 0 ?Complex (extensive) Discharge Coordination ?PROCESS - Special Needs ?[]  - 0 ?Pediatric / Minor Patient Management ?[]  - 0 ?Isolation Patient Management ?[]  - 0 ?Hearing / Language /  Visual special needs ?[]  - 0 ?Assessment of Community assistance (transportation, D/C planning, etc.) ?[]  - 0 ?Additional assistance / Altered mentation ?[]  - 0 ?Support Surface(s) Assessment (bed, cushion, seat, etc.) ?INTERVENTIONS - Wound Cleansing / Measurement ?[]  - 0 ?Wound Imaging (photographs - any number of wounds) ?[]  - 0 ?Wound Tracing (instead of photographs) ?[]  - 0 ?Simple  Wound Measurement - one wound ?[]  - 0 ?Complex Wound Measurement - multiple wounds ?[]  - 0 ?Simple Wound Cleansing - one wound ?[]  - 0 ?Complex Wound Cleansing - multiple wounds ?INTERVENTIONS - Wound Dressings ?[]  - 0 ?Small Wound Dressing one or multiple wounds ?[]  - 0 ?Medium Wound Dressing one or multiple wounds ?[]  - 0 ?Large Wound Dressing one or multiple wounds ?[]  - 0 ?Application of Medications - injection ?INTERVENTIONS - Miscellaneous ?[]  - 0 ?External ear exam ?[]  - 0 ?Specimen Collection (cultures, biopsies, blood, body fluids, etc.) ?[]  - 0 ?Specimen(s) / Culture(s) sent or taken to Lab for analysis ?[]  - 0 ?Patient Transfer (multiple staff / / Similar devices) ?[]  - 0 ?Simple Staple / Suture removal (25 or less) ?[]  - 0 ?Complex Staple / Suture removal (26 or more) ?[]  - 0 ?Hypo / Hyperglycemic Management (close monitor of Blood Glucose) ?X- 1 15 ?Ankle / Brachial Index (ABI) - do not check if billed separately ?Has the patient been seen at the hospital within the last three years: Yes ?Total Score: 100 ?Level Of Care: New/Established - Level 3 ?Electronic Signature(s) ?Signed: 08/30/2021 4:50:27 PM By: ?Entered By: on 08/30/2021 15:36:08 ?-------------------------------------------------------------------------------- ?Encounter Discharge Information Details ?Patient Name: Date of Service: ?[name], [name] 08/30/2021 1:15 PM ?Medical Record Number: ?Patient Account Number: ?Date of Birth/Sex: Treating RN: ?[birth year] (76 y.o. Female) Nurse, adult ?Primary  Care Yaneisy Wenz: Other Clinician: ?Referring Rowan Blaker: ?Treating Alexxa Sabet/Extender: ? ?Weeks in Treatment: 0 ?Encounter Discharge Information Items ?Discharge Condition: Stable ?Ambulatory Status: Wheelchair ?Discharge Destination: Home ?Transportation: Private Auto ?Accompanied By: husband ?Schedule Follow-up Appointment: No ?Clinical Summary of Care: Provided on 08/30/2021 ?Form Type Recipient ?Paper Patient Patient ?Electronic Signature(s) ?Signed: 08/30/2021 4:50:27 PM By: Antonieta Iba ?Entered By: 09/01/2021 on 08/30/2021 15:40:07 ?-------------------------------------------------------------------------------- ?Lower Extremity Assessment Details ?Patient Name: ?Date of Service: ?[name], Haley L. 08/30/2021 1:15 PM ?Medical Record Number: [id number] ?Patient Account Number: [id number] ?Date of Birth/Sex: ?Treating RN: ?15-Apr-1946 (76 y.o. Female) Antonieta Iba ?Primary Care Bracen Schum: Foye Deer ?Other Clinician: ?Referring Ezinne Yogi: ?Treating Sadiq Mccauley/Extender: Geralyn Corwin ?Foye Deer ?Weeks in Treatment: 0 ?Edema Assessment ?Assessed: [Left: Yes] [Right: Yes] ?Edema: [Left: Yes] [Right: Yes] ?Calf ?Left: Right: ?Point of Measurement: 29 cm From Medial Instep 44 cm 42.8 cm ?Ankle ?Left: Right: ?Point of Measurement: 9 cm From Medial Instep 31 cm 28 cm ?Knee To Floor ?Left: Right: ?From Medial Instep 32 cm 32 cm ?Vascular Assessment ?Pulses: ?Dorsalis Pedis ?Palpable: [Left:No] [Right:No] ?Doppler Audible: [Left:Yes] [Right:Yes] ?Blood Pressure: ?Brachial: [Right:159] ?Ankle: ?[Right:Dorsalis Pedis: 148 0.93] ?Notes ?L ABI = Non Compressible ?Electronic Signature(s) ?Signed: 08/30/2021 4:50:27 PM By: 09/01/2021 ?Entered By: Antonieta Iba on 08/30/2021 14:37:25 ?-------------------------------------------------------------------------------- ?Multi Wound Chart Details ?Patient Name: ?Date of Service: ?[name], Chevette L. 08/30/2021 1:15 PM ?Medical  Record Number: 09/01/2021 ?Patient Account Number: [id number] ?Date of Birth/Sex: ?Treating RN: ?01-May-1946 (76 y.o. Female) (67 ?Primary Care Damacio Weisgerber: Antonieta Iba ?Other Clinician: ?Referring P

## 2021-08-30 ENCOUNTER — Encounter (HOSPITAL_BASED_OUTPATIENT_CLINIC_OR_DEPARTMENT_OTHER): Payer: Medicare Other | Attending: Internal Medicine | Admitting: Internal Medicine

## 2021-08-30 DIAGNOSIS — I63231 Cerebral infarction due to unspecified occlusion or stenosis of right carotid arteries: Secondary | ICD-10-CM

## 2021-08-30 DIAGNOSIS — E1142 Type 2 diabetes mellitus with diabetic polyneuropathy: Secondary | ICD-10-CM | POA: Diagnosis not present

## 2021-08-30 DIAGNOSIS — I5032 Chronic diastolic (congestive) heart failure: Secondary | ICD-10-CM | POA: Diagnosis not present

## 2021-08-30 DIAGNOSIS — J449 Chronic obstructive pulmonary disease, unspecified: Secondary | ICD-10-CM | POA: Insufficient documentation

## 2021-08-30 DIAGNOSIS — I890 Lymphedema, not elsewhere classified: Secondary | ICD-10-CM | POA: Diagnosis not present

## 2021-08-30 DIAGNOSIS — Z8673 Personal history of transient ischemic attack (TIA), and cerebral infarction without residual deficits: Secondary | ICD-10-CM | POA: Insufficient documentation

## 2021-08-30 NOTE — Progress Notes (Signed)
Theard, Arena L. ([id number]) ?Visit Report for 08/30/2021 ?Abuse Risk Screen Details ?Patient Name: Date of Service: ?[name], [name] 08/30/2021 1:15 PM ?Medical Record Number: [id number] ?Patient Account Number: [id number] ?Date of Birth/Sex: Treating RN: ?[birth year] (76 y.o. Female) [name] ?Primary Care Porshe Fleagle: Nelda Bucks Other Clinician: ?Referring Kambrea Carrasco: ?Treating Chizuko Trine/Extender: Kalman Shan ?Nelda Bucks ?Weeks in Treatment: 0 ?Abuse Risk Screen Items ?Answer ?ABUSE RISK SCREEN: ?Has anyone close to you tried to hurt or harm you recentlyo No ?Do you feel uncomfortable with anyone in your familyo No ?Has anyone forced you do things that you didnt want to doo No ?Electronic Signature(s) ?Signed: 08/30/2021 4:50:27 PM By: [name] ?Entered By: [name] on 08/30/2021 14:16:27 ?-------------------------------------------------------------------------------- ?Activities of Daily Living Details ?Patient Name: Date of Service: ?[name], [name] 08/30/2021 1:15 PM ?Medical Record Number: [id number] ?Patient Account Number: [id number] ?Date of Birth/Sex: Treating RN: ?21-Apr-1946 (76 y.o. Female) [name] ?Primary Care Rease Wence: Nelda Bucks Other Clinician: ?Referring Yvanna Vidas: ?Treating Dimond Crotty/Extender: Kalman Shan ?Nelda Bucks ?Weeks in Treatment: 0 ?Activities of Daily Living Items ?Answer ?Activities of Daily Living (Please select one for each item) ?Drive Automobile Not Able ?T Medications ?ake Completely Able ?Use T elephone Completely Able ?Care for Appearance Need Assistance ?Use T oilet Need Assistance ?Bath / Shower Need Assistance ?Dress Self Need Assistance ?Feed Self Completely Able ?Walk Need Assistance ?Get In / Out Bed Need Assistance ?Housework Need Assistance ?Prepare Meals Need Assistance ?Handle Money Need Assistance ?Shop for Self Need Assistance ?Electronic Signature(s) ?Signed: 08/30/2021 4:50:27 PM By: [name] ?Entered By: [name] on  08/30/2021 14:17:10 ?-------------------------------------------------------------------------------- ?Education Screening Details ?Patient Name: ?Date of Service: ?[name], [name] 08/30/2021 1:15 PM ?Medical Record Number: [id number] ?Patient Account Number: [id number] ?Date of Birth/Sex: ?Treating RN: ?Dec 22, 1945 (76 y.o. Female) [name] ?Primary Care Gerald Honea: Nelda Bucks ?Other Clinician: ?Referring Brylon Brenning: ?Treating Arend Bahl/Extender: Kalman Shan ?Nelda Bucks ?Weeks in Treatment: 0 ?Primary Learner Assessed: Patient ?Learning Preferences/Education Level/Primary Language ?Learning Preference: Explanation, Demonstration, Printed Material ?Highest Education Level: High School ?Preferred Language: English ?Cognitive Barrier ?Language Barrier: No ?Translator Needed: No ?Memory Deficit: No ?Emotional Barrier: No ?Cultural/Religious Beliefs Affecting Medical Care: No ?Physical Barrier ?Impaired Vision: Yes Glasses ?Impaired Hearing: No ?Decreased Hand dexterity: No ?Knowledge/Comprehension ?Knowledge Level: High ?Comprehension Level: High ?Ability to understand written instructions: High ?Ability to understand verbal instructions: High ?Motivation ?Anxiety Level: Calm ?Cooperation: Cooperative ?Education Importance: Acknowledges Need ?Interest in Health Problems: Asks Questions ?Perception: Coherent ?Willingness to Engage in Self-Management High ?Activities: ?Readiness to Engage in Self-Management High ?Activities: ?Electronic Signature(s) ?Signed: 08/30/2021 4:50:27 PM By: [name] ?Entered By: [name] on 08/30/2021 14:17:38 ?-------------------------------------------------------------------------------- ?Fall Risk Assessment Details ?Patient Name: ?Date of Service: ?[name], Haley L. 08/30/2021 1:15 PM ?Medical Record Number: [id number] ?Patient Account Number: [id number] ?Date of Birth/Sex: ?Treating RN: ?Feb 14, 1946 (76 y.o. Female) [name] ?Primary Care Arnette Driggs: Nelda Bucks ?Other Clinician: ?Referring Amandy Chubbuck: ?Treating Solace Manwarren/Extender: Kalman Shan ?Nelda Bucks ?Weeks in Treatment: 0 ?Fall Risk Assessment Items ?Have you had 2 or more falls in the last 12 monthso 0 No ?Have you had any fall that resulted in injury in the last 12 monthso 0 No ?FALLS RISK SCREEN ?History of falling - immediate or within 3 months 0 No ?Secondary diagnosis (Do you have 2 or more medical diagnoseso) 15 Yes ?Ambulatory aid ?None/bed rest/wheelchair/nurse 0 No ?Crutches/cane/walker 15 Yes ?Furniture 0 No ?Intravenous therapy Access/Saline/Heparin Lock 0 No ?Gait/Transferring ?Normal/ bed rest/ wheelchair 0 No ?Weak (short steps with or without shuffle, stooped but  able to lift head while walking, may seek 10 Yes ?support from furniture) ?Impaired (short steps with shuffle, may have difficulty arising from chair, head down, impaired 0 No ?balance) ?Mental Status ?Oriented to own ability 0 Yes ?Electronic Signature(s) ?Signed: 08/30/2021 4:50:27 PM By: [name] ?Entered By: [name] on 08/30/2021 14:17:58 ?-------------------------------------------------------------------------------- ?Foot Assessment Details ?Patient Name: ?Date of Service: ?[name], Haley L. 08/30/2021 1:15 PM ?Medical Record Number: [id number] ?Patient Account Number: [id number] ?Date of Birth/Sex: ?Treating RN: ?[birth year] (76 y.o. Female) [name] ?Primary Care Hager Compston: Nelda Bucks ?Other Clinician: ?Referring Eniya Cannady: ?Treating Cordia Miklos/Extender: Kalman Shan ?Nelda Bucks ?Weeks in Treatment: 0 ?Foot Assessment Items ?Site Locations ?pain locations ?+ = Sensation present, - = Sensation absent, C = Callus, U = Ulcer ?R = Redness, W = Warmth, M = Maceration, PU = Pre-ulcerative lesion ?F = Fissure, S = Swelling, D = Dryness ?Assessment ?Right: Left: ?Other Deformity: No No ?Prior Foot Ulcer: No No ?Prior Amputation: No No ?Charcot Joint: No No ?Ambulatory Status: Ambulatory With  Help ?Assistance Device: Walker ?Gait: Steady ?Electronic Signature(s) ?Signed: 08/30/2021 4:50:27 PM By: [name] ?Entered By: [name] on 08/30/2021 14:43:04 ?-------------------------------------------------------------------------------- ?Nutrition Risk Screening Details ?Patient Name: ?Date of Service: ?[name], Emerita L. 08/30/2021 1:15 PM ?Medical Record Number: [id number] ?Patient Account Number: [id number] ?Date of Birth/Sex: ?Treating RN: ?1945/12/13 (76 y.o. Female) [name] ?Primary Care Daneen Volcy: Nelda Bucks ?Other Clinician: ?Referring Xylan Sheils: ?Treating Fayne Mcguffee/Extender: Kalman Shan ?Nelda Bucks ?Weeks in Treatment: 0 ?Height (in): 63 ?Weight (lbs): 187 ?Body Mass Index (BMI): 33.1 ?Nutrition Risk Screening Items ?Score Screening ?NUTRITION RISK SCREEN: ?I have an illness or condition that made me change the kind and/or amount of food I eat 0 No ?I eat fewer than two meals per day 0 No ?I eat few fruits and vegetables, or milk products 0 No ?I have three or more drinks of beer, liquor or wine almost every day 0 No ?I have tooth or mouth problems that make it hard for me to eat 0 No ?I don't always have enough money to buy the food I need 0 No ?I eat alone most of the time 0 No ?I take three or more different prescribed or over-the-counter drugs a day 1 Yes ?Without wanting to, I have lost or gained 10 pounds in the last six months 0 No ?I am not always physically able to shop, cook and/or feed myself 0 No ?Nutrition Protocols ?Good Risk Protocol 0 No interventions needed ?Moderate Risk Protocol ?High Risk Proctocol ?Risk Level: Good Risk ?Score: 1 ?Electronic Signature(s) ?Signed: 08/30/2021 4:50:27 PM By: [name] ?Entered By: [name] on 08/30/2021 14:18:20 ?

## 2021-08-30 NOTE — Progress Notes (Signed)
Bartee, Eevee L. ([id number]) ?Visit Report for 08/30/2021 ?Chief Complaint Document Details ?Patient Name: Date of Service: ?Haley LL, [name] 08/30/2021 1:15 PM ?Medical Record Number: [id number] ?Patient Account Number: [id number] ?Date of Birth/Sex: Treating RN: ?04/16/1946 (76 y.o. Female) [name] ?Primary Care Provider: Nelda Gray Other Clinician: ?Referring Provider: ?Treating Provider/Extender: [name] ?[name] ?Weeks in Treatment: 0 ?Information Obtained from: Patient ?Chief Complaint ?08/30/2021; Bilateral lower extremity swelling ?Electronic Signature(s) ?Signed: 08/30/2021 5:02:43 PM By: Haley Shan DO ?Entered By: [name] on 08/30/2021 16:40:08 ?-------------------------------------------------------------------------------- ?HPI Details ?Patient Name: Date of Service: ?[name] 08/30/2021 1:15 PM ?Medical Record Number: [id number] ?Patient Account Number: [id number] ?Date of Birth/Sex: Treating RN: ?05/15/1945 (76 y.o. Female) [name] ?Primary Care Provider: Nelda Gray Other Clinician: ?Referring Provider: ?Treating Provider/Extender: [name] ?[name] ?Weeks in Treatment: 0 ?History of Present Illness ?HPI Description: Consult 08/30/2021 ?Ms. [name] is a 76 year old female with a past medical history of type 2 diabetes, CVA, COPD, chronic diastolic congestive heart failure, lymphedema and ?chronic venous insufficiency that presents to the clinic for a history of wounds to her legs bilaterally. She states that 2 weeks ago she developed blistering and ?open wounds and has been treated with Unna boots. She presents today with no open wounds. She has no compression stockings. She denies signs of ?infection. ?Electronic Signature(s) ?Signed: 08/30/2021 5:02:43 PM By: Haley Shan DO ?Entered By: [name] on 08/30/2021 16:41:15 ?-------------------------------------------------------------------------------- ?Physical Exam  Details ?Patient Name: Date of Service: ?Haley LL, [name] 08/30/2021 1:15 PM ?Medical Record Number: [id number] ?Patient Account Number: [id number] ?Date of Birth/Sex: Treating RN: ?07/05/1945 (76 y.o. Female) [name] ?Primary Care Provider: Nelda Gray Other Clinician: ?Referring Provider: ?Treating Provider/Extender: [name] ?[name] ?Weeks in Treatment: 0 ?Constitutional ?respirations regular, non-labored and within target range for patient.Marland Kitchen ?Cardiovascular ?2+ dorsalis pedis/posterior tibialis pulses. ?Psychiatric ?pleasant and cooperative. ?Notes ?Bilateral lower extremities with 2+ pitting edema to the knee. Venous stasis dermatitis changes. No open wounds. ?Electronic Signature(s) ?Signed: 08/30/2021 5:02:43 PM By: Haley Shan DO ?Entered By: [name] on 08/30/2021 16:41:48 ?-------------------------------------------------------------------------------- ?Physician Orders Details ?Patient Name: Date of Service: ?[name] 08/30/2021 1:15 PM ?Medical Record Number: [id number] ?Patient Account Number: [id number] ?Date of Birth/Sex: Treating RN: ?08-Nov-1945 (77 y.o. Female) [name] ?Primary Care Provider: Nelda Gray Other Clinician: ?Referring Provider: ?Treating Provider/Extender: [name] ?[name] ?Weeks in Treatment: 0 ?Verbal / Phone Orders: No ?Diagnosis Coding ?Follow-up Appointments ?Other: - No follow-up, consult only. ?Edema Control - Lymphedema / SCD / Other ?Moisturize legs daily. ?Other Edema Control Orders/Instructions: - We recommend you using compression stockings daily to control swelling. Continue to use diuretic. ?Electronic Signature(s) ?Signed: 08/30/2021 5:02:43 PM By: Haley Shan DO ?Entered By: [name] on 08/30/2021 16:42:05 ?-------------------------------------------------------------------------------- ?Problem List Details ?Patient Name: Date of Service: ?[name] 08/30/2021 1:15 PM ?Medical Record  Number: [id number] ?Patient Account Number: [id number] ?Date of Birth/Sex: Treating RN: ?11/16/1945 (76 y.o. Female) [name] ?Primary Care Provider: Nelda Gray Other Clinician: ?Referring Provider: ?Treating Provider/Extender: [name] ?[name] ?Weeks in Treatment: 0 ?Active Problems ?ICD-10 ?Encounter ?Code Description Active Date MDM ?Diagnosis ?I89.0 Lymphedema, not elsewhere classified 08/30/2021 No Yes ?99991111 Chronic diastolic (congestive) heart failure 08/30/2021 No Yes ?E11.42 Type 2 diabetes mellitus with diabetic polyneuropathy 08/30/2021 No Yes ?[id number] Cerebral infarction due to unspecified occlusion or stenosis of right carotid 08/30/2021 No Yes ?arteries ?Inactive Problems ?Resolved Problems ?Electronic Signature(s) ?Signed: 08/30/2021 5:02:43 PM By: Haley Shan DO ?Entered By: [name] on  08/30/2021 16:39:35 ?-------------------------------------------------------------------------------- ?Progress Note Details ?Patient Name: Date of Service: ?[name] 08/30/2021 1:15 PM ?Medical Record Number: [id number] ?Patient Account Number: [id number] ?Date of Birth/Sex: Treating RN: ?[birth year] (76 y.o. Female) [name] ?Primary Care Provider: Nelda Gray Other Clinician: ?Referring Provider: ?Treating Provider/Extender: [name] ?[name] ?Weeks in Treatment: 0 ?Subjective ?Chief Complaint ?Information obtained from Patient ?08/30/2021; Bilateral lower extremity swelling ?History of Present Illness (HPI) ?Consult 08/30/2021 ?Ms. [name] is a 76 year old female with a past medical history of type 2 diabetes, CVA, COPD, chronic diastolic congestive heart failure, lymphedema and ?chronic venous insufficiency that presents to the clinic for a history of wounds to her legs bilaterally. She states that 2 weeks ago she developed blistering and ?open wounds and has been treated with Unna boots. She presents today with no open wounds. She has no  compression stockings. She denies signs of ?infection. ?Patient History ?Information obtained from Patient, Chart. ?Allergies ?alendronate sodium, valsartan (Reaction: N/V) ?Family History ?Cancer - Father, Diabetes - Mother,Siblings, Heart Disease - Siblings, Hypertension - Siblings, ?No family history of Hereditary Spherocytosis, Kidney Disease, Lung Disease, Seizures, Stroke, Thyroid Problems, Tuberculosis. ?Social History ?Never smoker, Marital Status - Married, Drug Use - No History. ?Medical History ?Respiratory ?Patient has history of Chronic Obstructive Pulmonary Disease (COPD) ?Cardiovascular ?Patient has history of Congestive Heart Failure, Coronary Artery Disease, Hypertension ?Endocrine ?Patient has history of Type II Diabetes ?Neurologic ?Patient has history of Neuropathy ?Patient is treated with Insulin, Oral Agents. Blood sugar is tested. ?Medical A Surgical History Notes ?nd ?Cardiovascular ?CVA 2020 ?Review of Systems (ROS) ?Eyes ?Complains or has symptoms of Glasses / Contacts. ?Ear/Nose/Mouth/Throat ?Denies complaints or symptoms of Chronic sinus problems or rhinitis. ?Gastrointestinal ?Denies complaints or symptoms of Frequent diarrhea, Nausea, Vomiting. ?Genitourinary ?Denies complaints or symptoms of Frequent urination. ?Integumentary (Skin) ?Complains or has symptoms of Wounds. ?Musculoskeletal ?Denies complaints or symptoms of Muscle Pain, Muscle Weakness. ?Psychiatric ?Denies complaints or symptoms of Claustrophobia, Suicidal. ?Objective ?Constitutional ?respirations regular, non-labored and within target range for patient.Marland Kitchen ?Vitals Time Taken: 2:08 PM, Height: 63 in, Source: Stated, Weight: 187 lbs, Source: Stated, BMI: 33.1, Temperature: 98 ??F, Pulse: 103 bpm, Respiratory Rate: ?18 breaths/min, Blood Pressure: 159/97 mmHg, Capillary Blood Glucose: 140 mg/dl. ?Cardiovascular ?2+ dorsalis pedis/posterior tibialis pulses. ?Psychiatric ?pleasant and cooperative. ?General Notes: Bilateral  lower extremities with 2+ pitting edema to the knee. Venous stasis dermatitis changes. No open wounds. ?Assessment ?Active Problems ?ICD-10 ?Lymphedema, not elsewhere classified ?Chronic diastolic (congestive) heart

## 2021-08-31 DIAGNOSIS — L03115 Cellulitis of right lower limb: Secondary | ICD-10-CM | POA: Diagnosis not present

## 2021-08-31 DIAGNOSIS — I509 Heart failure, unspecified: Secondary | ICD-10-CM | POA: Diagnosis not present

## 2021-08-31 DIAGNOSIS — L03116 Cellulitis of left lower limb: Secondary | ICD-10-CM | POA: Diagnosis not present

## 2021-08-31 DIAGNOSIS — I110 Hypertensive heart disease with heart failure: Secondary | ICD-10-CM | POA: Diagnosis not present

## 2021-08-31 DIAGNOSIS — E1142 Type 2 diabetes mellitus with diabetic polyneuropathy: Secondary | ICD-10-CM | POA: Diagnosis not present

## 2021-08-31 DIAGNOSIS — E11649 Type 2 diabetes mellitus with hypoglycemia without coma: Secondary | ICD-10-CM | POA: Diagnosis not present

## 2021-08-31 DIAGNOSIS — Z7984 Long term (current) use of oral hypoglycemic drugs: Secondary | ICD-10-CM | POA: Diagnosis not present

## 2021-08-31 DIAGNOSIS — I252 Old myocardial infarction: Secondary | ICD-10-CM | POA: Diagnosis not present

## 2021-09-02 DIAGNOSIS — L03116 Cellulitis of left lower limb: Secondary | ICD-10-CM | POA: Diagnosis not present

## 2021-09-02 DIAGNOSIS — Z7984 Long term (current) use of oral hypoglycemic drugs: Secondary | ICD-10-CM | POA: Diagnosis not present

## 2021-09-02 DIAGNOSIS — L03115 Cellulitis of right lower limb: Secondary | ICD-10-CM | POA: Diagnosis not present

## 2021-09-02 DIAGNOSIS — I110 Hypertensive heart disease with heart failure: Secondary | ICD-10-CM | POA: Diagnosis not present

## 2021-09-02 DIAGNOSIS — I252 Old myocardial infarction: Secondary | ICD-10-CM | POA: Diagnosis not present

## 2021-09-02 DIAGNOSIS — I509 Heart failure, unspecified: Secondary | ICD-10-CM | POA: Diagnosis not present

## 2021-09-06 DIAGNOSIS — L03116 Cellulitis of left lower limb: Secondary | ICD-10-CM | POA: Diagnosis not present

## 2021-09-06 DIAGNOSIS — I110 Hypertensive heart disease with heart failure: Secondary | ICD-10-CM | POA: Diagnosis not present

## 2021-09-06 DIAGNOSIS — I252 Old myocardial infarction: Secondary | ICD-10-CM | POA: Diagnosis not present

## 2021-09-06 DIAGNOSIS — I509 Heart failure, unspecified: Secondary | ICD-10-CM | POA: Diagnosis not present

## 2021-09-06 DIAGNOSIS — Z7984 Long term (current) use of oral hypoglycemic drugs: Secondary | ICD-10-CM | POA: Diagnosis not present

## 2021-09-06 DIAGNOSIS — L03115 Cellulitis of right lower limb: Secondary | ICD-10-CM | POA: Diagnosis not present

## 2021-09-09 DIAGNOSIS — L03115 Cellulitis of right lower limb: Secondary | ICD-10-CM | POA: Diagnosis not present

## 2021-09-09 DIAGNOSIS — I509 Heart failure, unspecified: Secondary | ICD-10-CM | POA: Diagnosis not present

## 2021-09-09 DIAGNOSIS — I252 Old myocardial infarction: Secondary | ICD-10-CM | POA: Diagnosis not present

## 2021-09-09 DIAGNOSIS — L03116 Cellulitis of left lower limb: Secondary | ICD-10-CM | POA: Diagnosis not present

## 2021-09-09 DIAGNOSIS — I110 Hypertensive heart disease with heart failure: Secondary | ICD-10-CM | POA: Diagnosis not present

## 2021-09-09 DIAGNOSIS — Z7984 Long term (current) use of oral hypoglycemic drugs: Secondary | ICD-10-CM | POA: Diagnosis not present

## 2021-09-12 ENCOUNTER — Other Ambulatory Visit: Payer: Self-pay

## 2021-09-12 DIAGNOSIS — I6521 Occlusion and stenosis of right carotid artery: Secondary | ICD-10-CM

## 2021-09-12 DIAGNOSIS — L03119 Cellulitis of unspecified part of limb: Secondary | ICD-10-CM

## 2021-09-13 DIAGNOSIS — L03116 Cellulitis of left lower limb: Secondary | ICD-10-CM | POA: Diagnosis not present

## 2021-09-13 DIAGNOSIS — I252 Old myocardial infarction: Secondary | ICD-10-CM | POA: Diagnosis not present

## 2021-09-13 DIAGNOSIS — Z7984 Long term (current) use of oral hypoglycemic drugs: Secondary | ICD-10-CM | POA: Diagnosis not present

## 2021-09-13 DIAGNOSIS — I509 Heart failure, unspecified: Secondary | ICD-10-CM | POA: Diagnosis not present

## 2021-09-13 DIAGNOSIS — L03115 Cellulitis of right lower limb: Secondary | ICD-10-CM | POA: Diagnosis not present

## 2021-09-13 DIAGNOSIS — I110 Hypertensive heart disease with heart failure: Secondary | ICD-10-CM | POA: Diagnosis not present

## 2021-09-14 DIAGNOSIS — L03115 Cellulitis of right lower limb: Secondary | ICD-10-CM | POA: Diagnosis not present

## 2021-09-14 DIAGNOSIS — L03116 Cellulitis of left lower limb: Secondary | ICD-10-CM | POA: Diagnosis not present

## 2021-09-14 DIAGNOSIS — Z7984 Long term (current) use of oral hypoglycemic drugs: Secondary | ICD-10-CM | POA: Diagnosis not present

## 2021-09-14 DIAGNOSIS — I252 Old myocardial infarction: Secondary | ICD-10-CM | POA: Diagnosis not present

## 2021-09-14 DIAGNOSIS — I110 Hypertensive heart disease with heart failure: Secondary | ICD-10-CM | POA: Diagnosis not present

## 2021-09-14 DIAGNOSIS — I509 Heart failure, unspecified: Secondary | ICD-10-CM | POA: Diagnosis not present

## 2021-09-16 DIAGNOSIS — G2581 Restless legs syndrome: Secondary | ICD-10-CM | POA: Diagnosis not present

## 2021-09-16 DIAGNOSIS — I252 Old myocardial infarction: Secondary | ICD-10-CM | POA: Diagnosis not present

## 2021-09-16 DIAGNOSIS — I110 Hypertensive heart disease with heart failure: Secondary | ICD-10-CM | POA: Diagnosis not present

## 2021-09-16 DIAGNOSIS — Z7984 Long term (current) use of oral hypoglycemic drugs: Secondary | ICD-10-CM | POA: Diagnosis not present

## 2021-09-16 DIAGNOSIS — L03116 Cellulitis of left lower limb: Secondary | ICD-10-CM | POA: Diagnosis not present

## 2021-09-16 DIAGNOSIS — Z7902 Long term (current) use of antithrombotics/antiplatelets: Secondary | ICD-10-CM | POA: Diagnosis not present

## 2021-09-16 DIAGNOSIS — I509 Heart failure, unspecified: Secondary | ICD-10-CM | POA: Diagnosis not present

## 2021-09-16 DIAGNOSIS — L03115 Cellulitis of right lower limb: Secondary | ICD-10-CM | POA: Diagnosis not present

## 2021-09-16 DIAGNOSIS — Z8673 Personal history of transient ischemic attack (TIA), and cerebral infarction without residual deficits: Secondary | ICD-10-CM | POA: Diagnosis not present

## 2021-09-16 DIAGNOSIS — Z90710 Acquired absence of both cervix and uterus: Secondary | ICD-10-CM | POA: Diagnosis not present

## 2021-09-16 DIAGNOSIS — R2243 Localized swelling, mass and lump, lower limb, bilateral: Secondary | ICD-10-CM | POA: Diagnosis not present

## 2021-09-16 DIAGNOSIS — Z794 Long term (current) use of insulin: Secondary | ICD-10-CM | POA: Diagnosis not present

## 2021-09-16 DIAGNOSIS — E7800 Pure hypercholesterolemia, unspecified: Secondary | ICD-10-CM | POA: Diagnosis not present

## 2021-09-16 DIAGNOSIS — Z9049 Acquired absence of other specified parts of digestive tract: Secondary | ICD-10-CM | POA: Diagnosis not present

## 2021-09-16 DIAGNOSIS — M810 Age-related osteoporosis without current pathological fracture: Secondary | ICD-10-CM | POA: Diagnosis not present

## 2021-09-16 DIAGNOSIS — Z96641 Presence of right artificial hip joint: Secondary | ICD-10-CM | POA: Diagnosis not present

## 2021-09-16 DIAGNOSIS — E1140 Type 2 diabetes mellitus with diabetic neuropathy, unspecified: Secondary | ICD-10-CM | POA: Diagnosis not present

## 2021-09-16 DIAGNOSIS — E538 Deficiency of other specified B group vitamins: Secondary | ICD-10-CM | POA: Diagnosis not present

## 2021-09-16 DIAGNOSIS — K219 Gastro-esophageal reflux disease without esophagitis: Secondary | ICD-10-CM | POA: Diagnosis not present

## 2021-09-19 DIAGNOSIS — L03116 Cellulitis of left lower limb: Secondary | ICD-10-CM | POA: Diagnosis not present

## 2021-09-19 DIAGNOSIS — I509 Heart failure, unspecified: Secondary | ICD-10-CM | POA: Diagnosis not present

## 2021-09-19 DIAGNOSIS — I110 Hypertensive heart disease with heart failure: Secondary | ICD-10-CM | POA: Diagnosis not present

## 2021-09-19 DIAGNOSIS — Z7984 Long term (current) use of oral hypoglycemic drugs: Secondary | ICD-10-CM | POA: Diagnosis not present

## 2021-09-19 DIAGNOSIS — I252 Old myocardial infarction: Secondary | ICD-10-CM | POA: Diagnosis not present

## 2021-09-19 DIAGNOSIS — L03115 Cellulitis of right lower limb: Secondary | ICD-10-CM | POA: Diagnosis not present

## 2021-09-23 DIAGNOSIS — L03116 Cellulitis of left lower limb: Secondary | ICD-10-CM | POA: Diagnosis not present

## 2021-09-23 DIAGNOSIS — I110 Hypertensive heart disease with heart failure: Secondary | ICD-10-CM | POA: Diagnosis not present

## 2021-09-23 DIAGNOSIS — Z7984 Long term (current) use of oral hypoglycemic drugs: Secondary | ICD-10-CM | POA: Diagnosis not present

## 2021-09-23 DIAGNOSIS — I252 Old myocardial infarction: Secondary | ICD-10-CM | POA: Diagnosis not present

## 2021-09-23 DIAGNOSIS — I509 Heart failure, unspecified: Secondary | ICD-10-CM | POA: Diagnosis not present

## 2021-09-23 DIAGNOSIS — L03115 Cellulitis of right lower limb: Secondary | ICD-10-CM | POA: Diagnosis not present

## 2021-09-27 ENCOUNTER — Ambulatory Visit (INDEPENDENT_AMBULATORY_CARE_PROVIDER_SITE_OTHER)
Admission: RE | Admit: 2021-09-27 | Discharge: 2021-09-27 | Disposition: A | Discharge status: Home / Self Care | Payer: Medicare Other | Source: Ambulatory Visit | Attending: Vascular Surgery | Admitting: Vascular Surgery

## 2021-09-27 ENCOUNTER — Encounter: Payer: Self-pay | Admitting: Vascular Surgery

## 2021-09-27 ENCOUNTER — Ambulatory Visit (HOSPITAL_COMMUNITY)
Admission: RE | Admit: 2021-09-27 | Discharge: 2021-09-27 | Disposition: A | Discharge status: Home / Self Care | Payer: Medicare Other | Source: Ambulatory Visit | Attending: Vascular Surgery | Admitting: Vascular Surgery

## 2021-09-27 ENCOUNTER — Ambulatory Visit (INDEPENDENT_AMBULATORY_CARE_PROVIDER_SITE_OTHER): Payer: Medicare Other | Admitting: Vascular Surgery

## 2021-09-27 DIAGNOSIS — L03119 Cellulitis of unspecified part of limb: Secondary | ICD-10-CM | POA: Insufficient documentation

## 2021-09-27 DIAGNOSIS — I6521 Occlusion and stenosis of right carotid artery: Secondary | ICD-10-CM | POA: Insufficient documentation

## 2021-09-27 DIAGNOSIS — I872 Venous insufficiency (chronic) (peripheral): Secondary | ICD-10-CM

## 2021-09-27 DIAGNOSIS — L03115 Cellulitis of right lower limb: Secondary | ICD-10-CM

## 2021-09-27 HISTORY — DX: Venous insufficiency (chronic) (peripheral): I87.2

## 2021-09-27 NOTE — Progress Notes (Signed)
Patient name: [name] MRN: [id number] DOB: 1945-06-13 Sex: female  REASON FOR VISIT: Venous stasis with cellulitis bilateral lower extremities  HPI: [name] is a 76 y.o. female with multiple medical problems as listed below that presents for evaluation of venous stasis of her bilateral lower extremities with cellulitis.  She is here with her niece who provides some of the history and notes that she has had some skin changes to her bilateral lower extremities for years.  More recently she has developed blisters and has now been in [employer].  The left leg ulcers have healed and she is now only in a right leg Unna boot. She is well-known to me and previously underwent right carotid endarterectomy on 11/22/18.  She describes some vague symptoms that are nonfocal and occur intermittently.  Past Medical History:  Diagnosis Date   Acute pain of left knee    Angina pectoris (HCC) 07/28/2016   With normal coronary arteriography   Carotid stenosis, right 11/18/2018   Cerebral infarction due to stenosis of right carotid artery (HCC) s/p tPA 11/15/2018   CHF (congestive heart failure) (HCC)    Chronic diastolic congestive heart failure (HCC) 07/28/2016   COPD (chronic obstructive pulmonary disease) (HCC) 01/14/2018   Diabetes mellitus type II, uncontrolled 11/18/2018   Diabetes mellitus without complication (HCC)    Diabetic polyneuropathy associated with type 2 diabetes mellitus (HCC) 11/30/2015   Fall 11/21/2018   Family hx-stroke 11/18/2018   Hyperlipidemia    Hypertension    Hypertensive heart disease with heart failure (HCC) 07/28/2016   Living in nursing home    Lumbar disc narrowing    Onychogryposis of toenail 09/07/2020   Osteoporosis    Pustules determined by examination 04/11/2018   Spondylosis    lumbosacral   Stroke [facility])     Past Surgical History:  Procedure Laterality Date   ABDOMINAL HYSTERECTOMY     APPENDECTOMY     CARDIAC CATHETERIZATION     CHOLECYSTECTOMY      ENDARTERECTOMY Right 11/22/2018   Procedure: ENDARTERECTOMY CAROTID RIGHT;  Surgeon: Cephus Shelling, MD;  Location: [facility] OR;  Service: Vascular;  Laterality: Right;   HIP SURGERY  07/16/2019   Had to repair a lot of damage   KNEE ARTHROCENTESIS  11/22/2018       PATCH ANGIOPLASTY Right 11/22/2018   Procedure: Patch Angioplasty Right Carotid Artery using Xenosure Biologic Patch;  Surgeon: Cephus Shelling, MD;  Location: [facility] OR;  Service: Vascular;  Laterality: Right;    Family History  Problem Relation Age of Onset   Stroke Mother    Hypertension Mother    Diabetes Mother    Heart disease Mother    Prostate cancer Father    Alcohol abuse Father    CAD Sister    CAD Sister    Valvular heart disease Sister     SOCIAL HISTORY: Social History   Tobacco Use   Smoking status: Never    Passive exposure: Never   Smokeless tobacco: Never  Substance Use Topics   Alcohol use: Not Currently    Allergies  Allergen Reactions   Alendronate     Other reaction(s): Other (See Comments) Makes me feel very weak   Valsartan Nausea And Vomiting    Current Outpatient Medications  Medication Sig Dispense Refill   atorvastatin (LIPITOR) 80 MG tablet Take 80 mg by mouth at bedtime.      clopidogrel (PLAVIX) 75 MG tablet Take 75 mg by mouth every morning.  ezetimibe (ZETIA) 10 MG tablet Take 10 mg by mouth every morning.      FARXIGA 10 MG TABS tablet Take 10 mg by mouth daily.     gabapentin (NEURONTIN) 300 MG capsule Take 300 mg by mouth in the morning and at bedtime.     HUMALOG 100 UNIT/ML injection Inject into the skin.     hydrALAZINE (APRESOLINE) 25 MG tablet Take 25 mg by mouth in the morning, at noon, and at bedtime.     insulin glargine (LANTUS) 100 UNIT/ML injection Inject 40 Units into the skin 2 (two) times daily.     lisinopril (ZESTRIL) 20 MG tablet Take 10 mg by mouth daily.     metolazone (ZAROXOLYN) 2.5 MG tablet Take 2.5 mg by mouth once a week. Take a 2nd  Metolazone if needed for fluid and BP     nitroGLYCERIN (NITROSTAT) 0.4 MG SL tablet Place 1 tablet (0.4 mg total) under the tongue every 5 (five) minutes as needed for chest pain. 30 tablet 3   ranolazine (RANEXA) 500 MG 12 hr tablet Take 1 tablet (500 mg total) by mouth 2 (two) times daily. 180 tablet 3   rOPINIRole (REQUIP) 1 MG tablet Take 1 mg by mouth at bedtime.      torsemide (DEMADEX) 100 MG tablet Take 100 mg by mouth 2 (two) times daily.     valsartan (DIOVAN) 320 MG tablet Take 320 mg by mouth daily.     glimepiride (AMARYL) 4 MG tablet Take 1 tablet (4 mg total) by mouth 2 (two) times daily with a meal. (Patient not taking: Reported on 09/27/2021) 60 tablet 2   isosorbide mononitrate (IMDUR) 30 MG 24 hr tablet Take 1 tablet (30 mg total) by mouth daily. 90 tablet 3   NOVOLOG 100 UNIT/ML injection Inject 30 Units into the skin in the morning, at noon, and at bedtime. (Patient not taking: Reported on 09/27/2021)     No current facility-administered medications for this visit.    REVIEW OF SYSTEMS:  [X]  denotes positive finding, [ ]  denotes negative finding Cardiac  Comments:  Chest pain or chest pressure:    Shortness of breath upon exertion:    Short of breath when lying flat:    Irregular heart rhythm:        Vascular    Pain in calf, thigh, or hip brought on by ambulation:    Pain in feet at night that wakes you up from your sleep:     Blood clot in your veins:    Leg swelling:  x       Pulmonary    Oxygen at home:    Productive cough:     Wheezing:         Neurologic    Sudden weakness in arms or legs:     Sudden numbness in arms or legs:     Sudden onset of difficulty speaking or slurred speech:    Temporary loss of vision in one eye:     Problems with dizziness:         Gastrointestinal    Blood in stool:     Vomited blood:         Genitourinary    Burning when urinating:     Blood in urine:        Psychiatric    Major depression:         Hematologic     Bleeding problems:    Problems with blood clotting too easily:  Skin    Rashes or ulcers:        Constitutional    Fever or chills:      PHYSICAL EXAM: Vitals:   09/27/21 1551 09/27/21 1555  BP: (!) 165/91 (!) 157/100  Pulse: 76 76  Resp: 14   Temp: (!) 97.3 F (36.3 C)   TempSrc: Temporal   SpO2: 96%   Weight: 210 lb (95.3 kg)   Height: 5\' 3"  (1.6 m)     GENERAL: The patient is a well-nourished female, in no acute distress. The vital signs are documented above. CARDIAC: There is a regular rate and rhythm.  VASCULAR:  Venous stasis changes to bilateral lower extremities with prominent lower extremity edema Hard to appreciate pedal pulses with edema but feet warm with no distal ulcerations PULMONARY: No respiratory distress. ABDOMEN: Soft and non-tender. MUSCULOSKELETAL: There are no major deformities or cyanosis. NEUROLOGIC: No focal weakness or paresthesias are detected. PSYCHIATRIC: The patient has a normal affect.     DATA:    Lower Venous Reflux Study   Patient Name:  [name]  Date of Exam:   09/27/2021  Medical Rec #: 09/29/2021     Accession #:    [id number]  Date of Birth: [birth year]     Patient Gender: F  Patient Age:   16 years  Exam Location:  [facility]  Procedure:      VAS Rudene Anda LOWER EXTREMITY VENOUS REFLUX  Referring Phys: [place]    ---------------------------------------------------------------------------  -----     Indications: Edema, and venous stasis right lower extremity,     Risk Factors: Obesity. cellulitis  Limitations: Difficulty positioning patient.  Performing Technologist: Sherald Hess RVT      Examination Guidelines: A complete evaluation includes B-mode imaging,  spectral  Doppler, color Doppler, and power Doppler as needed of all accessible  portions  of each vessel. Bilateral testing is considered an integral part of a  complete  examination. Limited examinations for reoccurring  indications may be  performed  as noted. The reflux portion of the exam is performed with the patient in  reverse Trendelenburg.  Significant venous reflux is defined as >500 ms in the superficial venous  system, and >1 second in the deep venous system.      Venous Reflux Times  +--------------+---------+------+-----------+------------+-----------------  --+  RIGHT         Reflux NoRefluxReflux TimeDiameter cmsComments                                      Yes                                               +--------------+---------+------+-----------+------------+-----------------  --+  CFV                     yes   >1 second                                   +--------------+---------+------+-----------+------------+-----------------  --+  FV mid        no                                                          +--------------+---------+------+-----------+------------+-----------------  --+  Popliteal     no                                                          +--------------+---------+------+-----------+------------+-----------------  --+  GSV at [facility]    no                            0.54    Enlarged lymph  node  +--------------+---------+------+-----------+------------+-----------------  --+  GSV prox thighno                            0.51                          +--------------+---------+------+-----------+------------+-----------------  --+  GSV mid thigh no                            0.46                          +--------------+---------+------+-----------+------------+-----------------  --+  GSV dist thighno                            0.45                          +--------------+---------+------+-----------+------------+-----------------  --+  GSV at knee   no                            0.35                           +--------------+---------+------+-----------+------------+-----------------  --+  GSV prox calf no                            0.33                          +--------------+---------+------+-----------+------------+-----------------  --+  GSV mid calf  no                            0.22                          +--------------+---------+------+-----------+------------+-----------------  --+  SSV Pop Fossa no                            0.31                          +--------------+---------+------+-----------+------------+-----------------  --+  SSV prox calf no                            0.35                          +--------------+---------+------+-----------+------------+-----------------  --+  SSV mid calf  no               >500 ms      0.26                          +--------------+---------+------+-----------+------------+-----------------  --+  AASV                                                NV                    +--------------+---------+------+-----------+------------+-----------------  --+           Summary:  Right:  - No evidence of deep vein thrombosis seen in the right lower extremity,  from the common femoral through the popliteal veins.  - No evidence of superficial venous reflux seen in the right greater  saphenous vein.  - Venous reflux is noted in the right common femoral vein.  - Venous reflux is noted in the right short saphenous vein.     *See table(s) above for measurements and observations.   Electronically signed by Sherald Hess MD on 09/27/2021 at 4:04:34 PM.  Assessment/Plan:  76 year old female well-known to me after previous right carotid endarterectomy in 2020.  She now presents with evidence of bilateral lower extremity venous insufficiency with ulceration consistent with CEAP classification C6.  Most of these are healing but she still has some blistering in the left leg as well as some  superficial ulceration in the right leg.  I would continue Unna boots for now as these seem to be working well.  Discussed continuing Unna boots until blisters completely healed and then she can go to compression therapy.  She does not feel she can wear compression stockings and I have given her Ace wraps as an alternative.  Also discussed the importance of exercise and leg elevation.  I will see her in 1 year with carotid duplex for ongoing surveillance.  Her carotid duplex today shows minimal 1 to 39% stenosis bilaterally with no significant recurrent disease after previous right carotid endarterectomy.   Cephus Shelling, MD [facility] Office: [phone]

## 2021-09-28 DIAGNOSIS — L03115 Cellulitis of right lower limb: Secondary | ICD-10-CM | POA: Diagnosis not present

## 2021-09-28 DIAGNOSIS — L03116 Cellulitis of left lower limb: Secondary | ICD-10-CM | POA: Diagnosis not present

## 2021-09-28 DIAGNOSIS — I252 Old myocardial infarction: Secondary | ICD-10-CM | POA: Diagnosis not present

## 2021-09-28 DIAGNOSIS — Z7984 Long term (current) use of oral hypoglycemic drugs: Secondary | ICD-10-CM | POA: Diagnosis not present

## 2021-09-28 DIAGNOSIS — I110 Hypertensive heart disease with heart failure: Secondary | ICD-10-CM | POA: Diagnosis not present

## 2021-09-28 DIAGNOSIS — I509 Heart failure, unspecified: Secondary | ICD-10-CM | POA: Diagnosis not present

## 2021-09-29 DIAGNOSIS — L03116 Cellulitis of left lower limb: Secondary | ICD-10-CM | POA: Diagnosis not present

## 2021-09-29 DIAGNOSIS — I252 Old myocardial infarction: Secondary | ICD-10-CM | POA: Diagnosis not present

## 2021-09-29 DIAGNOSIS — I509 Heart failure, unspecified: Secondary | ICD-10-CM | POA: Diagnosis not present

## 2021-09-29 DIAGNOSIS — Z7984 Long term (current) use of oral hypoglycemic drugs: Secondary | ICD-10-CM | POA: Diagnosis not present

## 2021-09-29 DIAGNOSIS — L03115 Cellulitis of right lower limb: Secondary | ICD-10-CM | POA: Diagnosis not present

## 2021-09-29 DIAGNOSIS — I110 Hypertensive heart disease with heart failure: Secondary | ICD-10-CM | POA: Diagnosis not present

## 2021-09-30 DIAGNOSIS — L03116 Cellulitis of left lower limb: Secondary | ICD-10-CM | POA: Diagnosis not present

## 2021-09-30 DIAGNOSIS — I509 Heart failure, unspecified: Secondary | ICD-10-CM | POA: Diagnosis not present

## 2021-09-30 DIAGNOSIS — I252 Old myocardial infarction: Secondary | ICD-10-CM | POA: Diagnosis not present

## 2021-09-30 DIAGNOSIS — I110 Hypertensive heart disease with heart failure: Secondary | ICD-10-CM | POA: Diagnosis not present

## 2021-09-30 DIAGNOSIS — L03115 Cellulitis of right lower limb: Secondary | ICD-10-CM | POA: Diagnosis not present

## 2021-09-30 DIAGNOSIS — Z7984 Long term (current) use of oral hypoglycemic drugs: Secondary | ICD-10-CM | POA: Diagnosis not present

## 2021-10-03 DIAGNOSIS — L03116 Cellulitis of left lower limb: Secondary | ICD-10-CM | POA: Diagnosis not present

## 2021-10-03 DIAGNOSIS — Z7984 Long term (current) use of oral hypoglycemic drugs: Secondary | ICD-10-CM | POA: Diagnosis not present

## 2021-10-03 DIAGNOSIS — I252 Old myocardial infarction: Secondary | ICD-10-CM | POA: Diagnosis not present

## 2021-10-03 DIAGNOSIS — L03115 Cellulitis of right lower limb: Secondary | ICD-10-CM | POA: Diagnosis not present

## 2021-10-03 DIAGNOSIS — I110 Hypertensive heart disease with heart failure: Secondary | ICD-10-CM | POA: Diagnosis not present

## 2021-10-03 DIAGNOSIS — I509 Heart failure, unspecified: Secondary | ICD-10-CM | POA: Diagnosis not present

## 2021-10-05 DIAGNOSIS — G8191 Hemiplegia, unspecified affecting right dominant side: Secondary | ICD-10-CM | POA: Diagnosis not present

## 2021-10-05 DIAGNOSIS — D519 Vitamin B12 deficiency anemia, unspecified: Secondary | ICD-10-CM | POA: Diagnosis not present

## 2021-10-05 DIAGNOSIS — F3341 Major depressive disorder, recurrent, in partial remission: Secondary | ICD-10-CM | POA: Diagnosis not present

## 2021-10-05 DIAGNOSIS — I110 Hypertensive heart disease with heart failure: Secondary | ICD-10-CM | POA: Diagnosis not present

## 2021-10-05 DIAGNOSIS — I5032 Chronic diastolic (congestive) heart failure: Secondary | ICD-10-CM | POA: Diagnosis not present

## 2021-10-05 DIAGNOSIS — G2581 Restless legs syndrome: Secondary | ICD-10-CM | POA: Diagnosis not present

## 2021-10-05 DIAGNOSIS — E785 Hyperlipidemia, unspecified: Secondary | ICD-10-CM | POA: Diagnosis not present

## 2021-10-05 DIAGNOSIS — I5030 Unspecified diastolic (congestive) heart failure: Secondary | ICD-10-CM | POA: Diagnosis not present

## 2021-10-05 DIAGNOSIS — E669 Obesity, unspecified: Secondary | ICD-10-CM | POA: Diagnosis not present

## 2021-10-05 DIAGNOSIS — E1142 Type 2 diabetes mellitus with diabetic polyneuropathy: Secondary | ICD-10-CM | POA: Diagnosis not present

## 2021-10-07 DIAGNOSIS — Z7984 Long term (current) use of oral hypoglycemic drugs: Secondary | ICD-10-CM | POA: Diagnosis not present

## 2021-10-07 DIAGNOSIS — L03116 Cellulitis of left lower limb: Secondary | ICD-10-CM | POA: Diagnosis not present

## 2021-10-07 DIAGNOSIS — L03115 Cellulitis of right lower limb: Secondary | ICD-10-CM | POA: Diagnosis not present

## 2021-10-07 DIAGNOSIS — I110 Hypertensive heart disease with heart failure: Secondary | ICD-10-CM | POA: Diagnosis not present

## 2021-10-07 DIAGNOSIS — I252 Old myocardial infarction: Secondary | ICD-10-CM | POA: Diagnosis not present

## 2021-10-07 DIAGNOSIS — I509 Heart failure, unspecified: Secondary | ICD-10-CM | POA: Diagnosis not present

## 2021-10-10 DIAGNOSIS — Z7984 Long term (current) use of oral hypoglycemic drugs: Secondary | ICD-10-CM | POA: Diagnosis not present

## 2021-10-10 DIAGNOSIS — I252 Old myocardial infarction: Secondary | ICD-10-CM | POA: Diagnosis not present

## 2021-10-10 DIAGNOSIS — L03115 Cellulitis of right lower limb: Secondary | ICD-10-CM | POA: Diagnosis not present

## 2021-10-10 DIAGNOSIS — I110 Hypertensive heart disease with heart failure: Secondary | ICD-10-CM | POA: Diagnosis not present

## 2021-10-10 DIAGNOSIS — L03116 Cellulitis of left lower limb: Secondary | ICD-10-CM | POA: Diagnosis not present

## 2021-10-10 DIAGNOSIS — I509 Heart failure, unspecified: Secondary | ICD-10-CM | POA: Diagnosis not present

## 2021-10-14 DIAGNOSIS — L03116 Cellulitis of left lower limb: Secondary | ICD-10-CM | POA: Diagnosis not present

## 2021-10-14 DIAGNOSIS — I509 Heart failure, unspecified: Secondary | ICD-10-CM | POA: Diagnosis not present

## 2021-10-14 DIAGNOSIS — Z7984 Long term (current) use of oral hypoglycemic drugs: Secondary | ICD-10-CM | POA: Diagnosis not present

## 2021-10-14 DIAGNOSIS — I252 Old myocardial infarction: Secondary | ICD-10-CM | POA: Diagnosis not present

## 2021-10-14 DIAGNOSIS — I110 Hypertensive heart disease with heart failure: Secondary | ICD-10-CM | POA: Diagnosis not present

## 2021-10-14 DIAGNOSIS — L03115 Cellulitis of right lower limb: Secondary | ICD-10-CM | POA: Diagnosis not present

## 2021-11-03 DIAGNOSIS — L03116 Cellulitis of left lower limb: Secondary | ICD-10-CM | POA: Diagnosis not present

## 2021-11-03 DIAGNOSIS — E876 Hypokalemia: Secondary | ICD-10-CM | POA: Diagnosis not present

## 2021-11-03 DIAGNOSIS — L03115 Cellulitis of right lower limb: Secondary | ICD-10-CM | POA: Diagnosis not present

## 2021-11-03 DIAGNOSIS — I890 Lymphedema, not elsewhere classified: Secondary | ICD-10-CM | POA: Diagnosis not present

## 2021-11-11 DIAGNOSIS — M47817 Spondylosis without myelopathy or radiculopathy, lumbosacral region: Secondary | ICD-10-CM | POA: Diagnosis not present

## 2021-11-11 DIAGNOSIS — L03115 Cellulitis of right lower limb: Secondary | ICD-10-CM | POA: Diagnosis not present

## 2021-11-11 DIAGNOSIS — D519 Vitamin B12 deficiency anemia, unspecified: Secondary | ICD-10-CM | POA: Diagnosis not present

## 2021-11-11 DIAGNOSIS — I110 Hypertensive heart disease with heart failure: Secondary | ICD-10-CM | POA: Diagnosis not present

## 2021-11-11 DIAGNOSIS — L97811 Non-pressure chronic ulcer of other part of right lower leg limited to breakdown of skin: Secondary | ICD-10-CM | POA: Diagnosis not present

## 2021-11-11 DIAGNOSIS — E875 Hyperkalemia: Secondary | ICD-10-CM | POA: Diagnosis not present

## 2021-11-11 DIAGNOSIS — J449 Chronic obstructive pulmonary disease, unspecified: Secondary | ICD-10-CM | POA: Diagnosis not present

## 2021-11-11 DIAGNOSIS — F3341 Major depressive disorder, recurrent, in partial remission: Secondary | ICD-10-CM | POA: Diagnosis not present

## 2021-11-11 DIAGNOSIS — E1151 Type 2 diabetes mellitus with diabetic peripheral angiopathy without gangrene: Secondary | ICD-10-CM | POA: Diagnosis not present

## 2021-11-11 DIAGNOSIS — L03116 Cellulitis of left lower limb: Secondary | ICD-10-CM | POA: Diagnosis not present

## 2021-11-11 DIAGNOSIS — I6521 Occlusion and stenosis of right carotid artery: Secondary | ICD-10-CM | POA: Diagnosis not present

## 2021-11-11 DIAGNOSIS — H539 Unspecified visual disturbance: Secondary | ICD-10-CM | POA: Diagnosis not present

## 2021-11-11 DIAGNOSIS — E1142 Type 2 diabetes mellitus with diabetic polyneuropathy: Secondary | ICD-10-CM | POA: Diagnosis not present

## 2021-11-11 DIAGNOSIS — I209 Angina pectoris, unspecified: Secondary | ICD-10-CM | POA: Diagnosis not present

## 2021-11-11 DIAGNOSIS — I69351 Hemiplegia and hemiparesis following cerebral infarction affecting right dominant side: Secondary | ICD-10-CM | POA: Diagnosis not present

## 2021-11-11 DIAGNOSIS — M810 Age-related osteoporosis without current pathological fracture: Secondary | ICD-10-CM | POA: Diagnosis not present

## 2021-11-11 DIAGNOSIS — M5136 Other intervertebral disc degeneration, lumbar region: Secondary | ICD-10-CM | POA: Diagnosis not present

## 2021-11-11 DIAGNOSIS — L97821 Non-pressure chronic ulcer of other part of left lower leg limited to breakdown of skin: Secondary | ICD-10-CM | POA: Diagnosis not present

## 2021-11-11 DIAGNOSIS — G2581 Restless legs syndrome: Secondary | ICD-10-CM | POA: Diagnosis not present

## 2021-11-11 DIAGNOSIS — I890 Lymphedema, not elsewhere classified: Secondary | ICD-10-CM | POA: Diagnosis not present

## 2021-11-11 DIAGNOSIS — R238 Other skin changes: Secondary | ICD-10-CM | POA: Diagnosis not present

## 2021-11-11 DIAGNOSIS — I5032 Chronic diastolic (congestive) heart failure: Secondary | ICD-10-CM | POA: Diagnosis not present

## 2021-11-11 DIAGNOSIS — E876 Hypokalemia: Secondary | ICD-10-CM | POA: Diagnosis not present

## 2021-11-11 DIAGNOSIS — I872 Venous insufficiency (chronic) (peripheral): Secondary | ICD-10-CM | POA: Diagnosis not present

## 2021-11-11 DIAGNOSIS — Z7902 Long term (current) use of antithrombotics/antiplatelets: Secondary | ICD-10-CM | POA: Diagnosis not present

## 2021-11-14 DIAGNOSIS — I872 Venous insufficiency (chronic) (peripheral): Secondary | ICD-10-CM | POA: Diagnosis not present

## 2021-11-14 DIAGNOSIS — L97821 Non-pressure chronic ulcer of other part of left lower leg limited to breakdown of skin: Secondary | ICD-10-CM | POA: Diagnosis not present

## 2021-11-14 DIAGNOSIS — E1151 Type 2 diabetes mellitus with diabetic peripheral angiopathy without gangrene: Secondary | ICD-10-CM | POA: Diagnosis not present

## 2021-11-14 DIAGNOSIS — I890 Lymphedema, not elsewhere classified: Secondary | ICD-10-CM | POA: Diagnosis not present

## 2021-11-14 DIAGNOSIS — L03116 Cellulitis of left lower limb: Secondary | ICD-10-CM | POA: Diagnosis not present

## 2021-11-14 DIAGNOSIS — L03115 Cellulitis of right lower limb: Secondary | ICD-10-CM | POA: Diagnosis not present

## 2021-11-18 DIAGNOSIS — I872 Venous insufficiency (chronic) (peripheral): Secondary | ICD-10-CM | POA: Diagnosis not present

## 2021-11-18 DIAGNOSIS — I890 Lymphedema, not elsewhere classified: Secondary | ICD-10-CM | POA: Diagnosis not present

## 2021-11-18 DIAGNOSIS — E1151 Type 2 diabetes mellitus with diabetic peripheral angiopathy without gangrene: Secondary | ICD-10-CM | POA: Diagnosis not present

## 2021-11-18 DIAGNOSIS — L97821 Non-pressure chronic ulcer of other part of left lower leg limited to breakdown of skin: Secondary | ICD-10-CM | POA: Diagnosis not present

## 2021-11-18 DIAGNOSIS — L03116 Cellulitis of left lower limb: Secondary | ICD-10-CM | POA: Diagnosis not present

## 2021-11-18 DIAGNOSIS — L03115 Cellulitis of right lower limb: Secondary | ICD-10-CM | POA: Diagnosis not present

## 2021-11-21 DIAGNOSIS — L03115 Cellulitis of right lower limb: Secondary | ICD-10-CM | POA: Diagnosis not present

## 2021-11-21 DIAGNOSIS — L97821 Non-pressure chronic ulcer of other part of left lower leg limited to breakdown of skin: Secondary | ICD-10-CM | POA: Diagnosis not present

## 2021-11-21 DIAGNOSIS — E1151 Type 2 diabetes mellitus with diabetic peripheral angiopathy without gangrene: Secondary | ICD-10-CM | POA: Diagnosis not present

## 2021-11-21 DIAGNOSIS — L03116 Cellulitis of left lower limb: Secondary | ICD-10-CM | POA: Diagnosis not present

## 2021-11-21 DIAGNOSIS — I890 Lymphedema, not elsewhere classified: Secondary | ICD-10-CM | POA: Diagnosis not present

## 2021-11-21 DIAGNOSIS — I872 Venous insufficiency (chronic) (peripheral): Secondary | ICD-10-CM | POA: Diagnosis not present
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
## 2022-01-20 ENCOUNTER — Encounter: Payer: Self-pay | Admitting: Cardiology

## 2022-01-20 ENCOUNTER — Ambulatory Visit: Payer: Medicare Other | Attending: Cardiology | Admitting: Cardiology

## 2022-01-20 VITALS — BP 144/68 | HR 102 | Ht 63.0 in | Wt 176.0 lb

## 2022-01-20 DIAGNOSIS — J449 Chronic obstructive pulmonary disease, unspecified: Secondary | ICD-10-CM | POA: Insufficient documentation

## 2022-01-20 DIAGNOSIS — I6521 Occlusion and stenosis of right carotid artery: Secondary | ICD-10-CM | POA: Diagnosis not present

## 2022-01-20 DIAGNOSIS — I25118 Atherosclerotic heart disease of native coronary artery with other forms of angina pectoris: Secondary | ICD-10-CM | POA: Insufficient documentation

## 2022-01-20 DIAGNOSIS — E782 Mixed hyperlipidemia: Secondary | ICD-10-CM | POA: Diagnosis not present

## 2022-01-20 DIAGNOSIS — I110 Hypertensive heart disease with heart failure: Secondary | ICD-10-CM | POA: Insufficient documentation

## 2022-01-20 NOTE — Patient Instructions (Signed)
Medication Instructions:  Your physician recommends that you continue on your current medications as directed. Please refer to the Current Medication list given to you today.  *If you need a refill on your cardiac medications before your next appointment, please call your pharmacy*   Lab Work: None If you have labs (blood work) drawn today and your tests are completely normal, you will receive your results only by: MyChart Message (if you have MyChart) OR A paper copy in the mail If you have any lab test that is abnormal or we need to change your treatment, we will call you to review the results.   Testing/Procedures: None   Follow-Up: At [facility], you and your health needs are our priority.  As part of our continuing mission to provide you with exceptional heart care, we have created designated Provider Care Teams.  These Care Teams include your primary Cardiologist (physician) and Advanced Practice Providers (APPs -  Physician Assistants and Nurse Practitioners) who all work together to provide you with the care you need, when you need it.  We recommend signing up for the patient portal called "MyChart".  Sign up information is provided on this After Visit Summary.  MyChart is used to connect with patients for Virtual Visits (Telemedicine).  Patients are able to view lab/test results, encounter notes, upcoming appointments, etc.  Non-urgent messages can be sent to your provider as well.   To learn more about what you can do with MyChart, go to [email or web address].    Your next appointment:   6 month(s)  The format for your next appointment:   In Person  Provider:   Brian Munley, MD    Other Instructions None  Important Information About Sugar       

## 2022-01-25 DIAGNOSIS — G2581 Restless legs syndrome: Secondary | ICD-10-CM | POA: Diagnosis not present

## 2022-01-25 DIAGNOSIS — D519 Vitamin B12 deficiency anemia, unspecified: Secondary | ICD-10-CM | POA: Diagnosis not present

## 2022-01-25 DIAGNOSIS — S81802A Unspecified open wound, left lower leg, initial encounter: Secondary | ICD-10-CM | POA: Diagnosis not present

## 2022-01-25 DIAGNOSIS — E785 Hyperlipidemia, unspecified: Secondary | ICD-10-CM | POA: Diagnosis not present

## 2022-01-25 DIAGNOSIS — I5032 Chronic diastolic (congestive) heart failure: Secondary | ICD-10-CM | POA: Diagnosis not present

## 2022-01-25 DIAGNOSIS — I110 Hypertensive heart disease with heart failure: Secondary | ICD-10-CM | POA: Diagnosis not present

## 2022-01-25 DIAGNOSIS — F3341 Major depressive disorder, recurrent, in partial remission: Secondary | ICD-10-CM | POA: Diagnosis not present

## 2022-01-25 DIAGNOSIS — E1142 Type 2 diabetes mellitus with diabetic polyneuropathy: Secondary | ICD-10-CM | POA: Diagnosis not present

## 2022-01-25 DIAGNOSIS — R399 Unspecified symptoms and signs involving the genitourinary system: Secondary | ICD-10-CM | POA: Diagnosis not present

## 2022-01-25 DIAGNOSIS — G8191 Hemiplegia, unspecified affecting right dominant side: Secondary | ICD-10-CM | POA: Diagnosis not present

## 2022-01-25 DIAGNOSIS — I5030 Unspecified diastolic (congestive) heart failure: Secondary | ICD-10-CM | POA: Diagnosis not present

## 2022-01-25 DIAGNOSIS — Z1231 Encounter for screening mammogram for malignant neoplasm of breast: Secondary | ICD-10-CM | POA: Diagnosis not present

## 2022-01-31 DIAGNOSIS — L602 Onychogryphosis: Secondary | ICD-10-CM | POA: Diagnosis not present

## 2022-03-17 LAB — METHYLMALONIC ACID, SERUM: Methylmalonic Acid, Quantitative: 207 nmol/L (ref 0–378)

## 2022-03-21 DIAGNOSIS — S80919A Unspecified superficial injury of unspecified knee, initial encounter: Secondary | ICD-10-CM | POA: Diagnosis not present

## 2022-03-21 DIAGNOSIS — W19XXXA Unspecified fall, initial encounter: Secondary | ICD-10-CM | POA: Diagnosis not present

## 2022-03-21 DIAGNOSIS — E119 Type 2 diabetes mellitus without complications: Secondary | ICD-10-CM | POA: Diagnosis not present

## 2022-03-21 DIAGNOSIS — M25551 Pain in right hip: Secondary | ICD-10-CM | POA: Diagnosis not present

## 2022-03-21 DIAGNOSIS — M25561 Pain in right knee: Secondary | ICD-10-CM | POA: Diagnosis not present

## 2022-03-21 DIAGNOSIS — M25562 Pain in left knee: Secondary | ICD-10-CM | POA: Diagnosis not present

## 2022-03-21 DIAGNOSIS — I959 Hypotension, unspecified: Secondary | ICD-10-CM | POA: Diagnosis not present

## 2022-03-21 DIAGNOSIS — S8001XA Contusion of right knee, initial encounter: Secondary | ICD-10-CM | POA: Diagnosis not present

## 2022-03-26 DIAGNOSIS — R5381 Other malaise: Secondary | ICD-10-CM | POA: Diagnosis not present

## 2022-03-26 DIAGNOSIS — I69351 Hemiplegia and hemiparesis following cerebral infarction affecting right dominant side: Secondary | ICD-10-CM | POA: Diagnosis not present

## 2022-03-26 DIAGNOSIS — B961 Klebsiella pneumoniae [K. pneumoniae] as the cause of diseases classified elsewhere: Secondary | ICD-10-CM | POA: Diagnosis not present

## 2022-03-26 DIAGNOSIS — L03315 Cellulitis of perineum: Secondary | ICD-10-CM | POA: Diagnosis not present

## 2022-03-26 DIAGNOSIS — M4316 Spondylolisthesis, lumbar region: Secondary | ICD-10-CM | POA: Diagnosis not present

## 2022-03-26 DIAGNOSIS — Z7401 Bed confinement status: Secondary | ICD-10-CM | POA: Diagnosis not present

## 2022-03-26 DIAGNOSIS — N390 Urinary tract infection, site not specified: Secondary | ICD-10-CM | POA: Diagnosis not present

## 2022-03-26 DIAGNOSIS — Z66 Do not resuscitate: Secondary | ICD-10-CM | POA: Diagnosis not present

## 2022-03-26 DIAGNOSIS — M6259 Muscle wasting and atrophy, not elsewhere classified, multiple sites: Secondary | ICD-10-CM | POA: Diagnosis not present

## 2022-03-26 DIAGNOSIS — E876 Hypokalemia: Secondary | ICD-10-CM | POA: Diagnosis not present

## 2022-03-26 DIAGNOSIS — W19XXXA Unspecified fall, initial encounter: Secondary | ICD-10-CM | POA: Diagnosis not present

## 2022-03-26 DIAGNOSIS — Z993 Dependence on wheelchair: Secondary | ICD-10-CM | POA: Diagnosis not present

## 2022-03-26 DIAGNOSIS — Z87440 Personal history of urinary (tract) infections: Secondary | ICD-10-CM | POA: Diagnosis not present

## 2022-03-26 DIAGNOSIS — R531 Weakness: Secondary | ICD-10-CM | POA: Diagnosis not present

## 2022-03-26 DIAGNOSIS — L03317 Cellulitis of buttock: Secondary | ICD-10-CM | POA: Diagnosis not present

## 2022-03-26 DIAGNOSIS — A419 Sepsis, unspecified organism: Secondary | ICD-10-CM | POA: Diagnosis not present

## 2022-03-26 DIAGNOSIS — L0231 Cutaneous abscess of buttock: Secondary | ICD-10-CM | POA: Diagnosis not present

## 2022-03-26 DIAGNOSIS — Z8673 Personal history of transient ischemic attack (TIA), and cerebral infarction without residual deficits: Secondary | ICD-10-CM | POA: Diagnosis not present

## 2022-03-26 DIAGNOSIS — I110 Hypertensive heart disease with heart failure: Secondary | ICD-10-CM | POA: Diagnosis not present

## 2022-03-26 DIAGNOSIS — F32A Depression, unspecified: Secondary | ICD-10-CM | POA: Diagnosis not present

## 2022-03-26 DIAGNOSIS — I491 Atrial premature depolarization: Secondary | ICD-10-CM | POA: Diagnosis not present

## 2022-03-26 DIAGNOSIS — J449 Chronic obstructive pulmonary disease, unspecified: Secondary | ICD-10-CM | POA: Diagnosis not present

## 2022-03-26 DIAGNOSIS — I5032 Chronic diastolic (congestive) heart failure: Secondary | ICD-10-CM | POA: Diagnosis not present

## 2022-03-26 DIAGNOSIS — Z7984 Long term (current) use of oral hypoglycemic drugs: Secondary | ICD-10-CM | POA: Diagnosis not present

## 2022-03-26 DIAGNOSIS — Z794 Long term (current) use of insulin: Secondary | ICD-10-CM | POA: Diagnosis not present

## 2022-03-26 DIAGNOSIS — R296 Repeated falls: Secondary | ICD-10-CM | POA: Diagnosis not present

## 2022-03-26 DIAGNOSIS — Z79899 Other long term (current) drug therapy: Secondary | ICD-10-CM | POA: Diagnosis not present

## 2022-03-26 DIAGNOSIS — Z7982 Long term (current) use of aspirin: Secondary | ICD-10-CM | POA: Diagnosis not present

## 2022-03-26 DIAGNOSIS — M1990 Unspecified osteoarthritis, unspecified site: Secondary | ICD-10-CM | POA: Diagnosis not present

## 2022-03-26 DIAGNOSIS — Z7902 Long term (current) use of antithrombotics/antiplatelets: Secondary | ICD-10-CM | POA: Diagnosis not present

## 2022-03-26 DIAGNOSIS — R2689 Other abnormalities of gait and mobility: Secondary | ICD-10-CM | POA: Diagnosis not present

## 2022-03-26 DIAGNOSIS — E119 Type 2 diabetes mellitus without complications: Secondary | ICD-10-CM | POA: Diagnosis not present

## 2022-03-26 DIAGNOSIS — B3749 Other urogenital candidiasis: Secondary | ICD-10-CM | POA: Diagnosis not present

## 2022-03-26 DIAGNOSIS — L89152 Pressure ulcer of sacral region, stage 2: Secondary | ICD-10-CM | POA: Diagnosis not present

## 2022-03-26 DIAGNOSIS — E7800 Pure hypercholesterolemia, unspecified: Secondary | ICD-10-CM | POA: Diagnosis not present

## 2022-03-26 DIAGNOSIS — I252 Old myocardial infarction: Secondary | ICD-10-CM | POA: Diagnosis not present

## 2022-03-26 DIAGNOSIS — R262 Difficulty in walking, not elsewhere classified: Secondary | ICD-10-CM | POA: Diagnosis not present

## 2022-03-26 DIAGNOSIS — N138 Other obstructive and reflux uropathy: Secondary | ICD-10-CM | POA: Diagnosis not present

## 2022-03-26 DIAGNOSIS — F419 Anxiety disorder, unspecified: Secondary | ICD-10-CM | POA: Diagnosis not present

## 2022-03-26 DIAGNOSIS — R279 Unspecified lack of coordination: Secondary | ICD-10-CM | POA: Diagnosis not present

## 2022-03-26 DIAGNOSIS — M545 Low back pain, unspecified: Secondary | ICD-10-CM | POA: Diagnosis not present

## 2022-03-29 DIAGNOSIS — A419 Sepsis, unspecified organism: Secondary | ICD-10-CM | POA: Diagnosis not present

## 2022-03-29 DIAGNOSIS — Z7401 Bed confinement status: Secondary | ICD-10-CM | POA: Diagnosis not present

## 2022-03-29 DIAGNOSIS — L03317 Cellulitis of buttock: Secondary | ICD-10-CM | POA: Diagnosis not present

## 2022-03-29 DIAGNOSIS — I5032 Chronic diastolic (congestive) heart failure: Secondary | ICD-10-CM | POA: Diagnosis not present

## 2022-03-29 DIAGNOSIS — E785 Hyperlipidemia, unspecified: Secondary | ICD-10-CM | POA: Diagnosis not present

## 2022-03-29 DIAGNOSIS — R279 Unspecified lack of coordination: Secondary | ICD-10-CM | POA: Diagnosis not present

## 2022-03-29 DIAGNOSIS — E1140 Type 2 diabetes mellitus with diabetic neuropathy, unspecified: Secondary | ICD-10-CM | POA: Diagnosis not present

## 2022-03-29 DIAGNOSIS — I10 Essential (primary) hypertension: Secondary | ICD-10-CM | POA: Diagnosis not present

## 2022-03-29 DIAGNOSIS — N138 Other obstructive and reflux uropathy: Secondary | ICD-10-CM | POA: Diagnosis not present

## 2022-03-29 DIAGNOSIS — M6259 Muscle wasting and atrophy, not elsewhere classified, multiple sites: Secondary | ICD-10-CM | POA: Diagnosis not present

## 2022-03-29 DIAGNOSIS — R296 Repeated falls: Secondary | ICD-10-CM | POA: Diagnosis not present

## 2022-03-29 DIAGNOSIS — I639 Cerebral infarction, unspecified: Secondary | ICD-10-CM | POA: Diagnosis not present

## 2022-03-29 DIAGNOSIS — L0231 Cutaneous abscess of buttock: Secondary | ICD-10-CM | POA: Diagnosis not present

## 2022-03-29 DIAGNOSIS — R2689 Other abnormalities of gait and mobility: Secondary | ICD-10-CM | POA: Diagnosis not present

## 2022-03-29 DIAGNOSIS — E119 Type 2 diabetes mellitus without complications: Secondary | ICD-10-CM | POA: Diagnosis not present

## 2022-03-29 DIAGNOSIS — I69351 Hemiplegia and hemiparesis following cerebral infarction affecting right dominant side: Secondary | ICD-10-CM | POA: Diagnosis not present

## 2022-03-29 DIAGNOSIS — L608 Other nail disorders: Secondary | ICD-10-CM | POA: Diagnosis not present

## 2022-03-29 DIAGNOSIS — Z8673 Personal history of transient ischemic attack (TIA), and cerebral infarction without residual deficits: Secondary | ICD-10-CM | POA: Diagnosis not present

## 2022-03-29 DIAGNOSIS — N390 Urinary tract infection, site not specified: Secondary | ICD-10-CM | POA: Diagnosis not present

## 2022-03-29 DIAGNOSIS — L988 Other specified disorders of the skin and subcutaneous tissue: Secondary | ICD-10-CM | POA: Diagnosis not present

## 2022-03-29 DIAGNOSIS — R262 Difficulty in walking, not elsewhere classified: Secondary | ICD-10-CM | POA: Diagnosis not present

## 2022-04-02 DIAGNOSIS — N390 Urinary tract infection, site not specified: Secondary | ICD-10-CM | POA: Diagnosis not present

## 2022-04-02 DIAGNOSIS — E1140 Type 2 diabetes mellitus with diabetic neuropathy, unspecified: Secondary | ICD-10-CM | POA: Diagnosis not present

## 2022-04-02 DIAGNOSIS — I5032 Chronic diastolic (congestive) heart failure: Secondary | ICD-10-CM | POA: Diagnosis not present

## 2022-04-02 DIAGNOSIS — L988 Other specified disorders of the skin and subcutaneous tissue: Secondary | ICD-10-CM | POA: Diagnosis not present

## 2022-04-04 DIAGNOSIS — L988 Other specified disorders of the skin and subcutaneous tissue: Secondary | ICD-10-CM | POA: Diagnosis not present

## 2022-04-04 DIAGNOSIS — N390 Urinary tract infection, site not specified: Secondary | ICD-10-CM | POA: Diagnosis not present

## 2022-04-04 DIAGNOSIS — I5032 Chronic diastolic (congestive) heart failure: Secondary | ICD-10-CM | POA: Diagnosis not present

## 2022-04-04 DIAGNOSIS — E1140 Type 2 diabetes mellitus with diabetic neuropathy, unspecified: Secondary | ICD-10-CM | POA: Diagnosis not present

## 2022-04-07 DIAGNOSIS — I639 Cerebral infarction, unspecified: Secondary | ICD-10-CM | POA: Diagnosis not present

## 2022-04-07 DIAGNOSIS — N390 Urinary tract infection, site not specified: Secondary | ICD-10-CM | POA: Diagnosis not present

## 2022-04-07 DIAGNOSIS — E785 Hyperlipidemia, unspecified: Secondary | ICD-10-CM | POA: Diagnosis not present

## 2022-04-07 DIAGNOSIS — E1140 Type 2 diabetes mellitus with diabetic neuropathy, unspecified: Secondary | ICD-10-CM | POA: Diagnosis not present

## 2022-04-13 DIAGNOSIS — I10 Essential (primary) hypertension: Secondary | ICD-10-CM | POA: Diagnosis not present

## 2022-04-13 DIAGNOSIS — N390 Urinary tract infection, site not specified: Secondary | ICD-10-CM | POA: Diagnosis not present

## 2022-04-13 DIAGNOSIS — E1140 Type 2 diabetes mellitus with diabetic neuropathy, unspecified: Secondary | ICD-10-CM | POA: Diagnosis not present

## 2022-04-26 DIAGNOSIS — E1140 Type 2 diabetes mellitus with diabetic neuropathy, unspecified: Secondary | ICD-10-CM | POA: Diagnosis not present

## 2022-05-02 DIAGNOSIS — L608 Other nail disorders: Secondary | ICD-10-CM | POA: Diagnosis not present

## 2022-05-09 DIAGNOSIS — E785 Hyperlipidemia, unspecified: Secondary | ICD-10-CM | POA: Diagnosis not present

## 2022-05-09 DIAGNOSIS — E1140 Type 2 diabetes mellitus with diabetic neuropathy, unspecified: Secondary | ICD-10-CM | POA: Diagnosis not present

## 2022-05-09 DIAGNOSIS — I639 Cerebral infarction, unspecified: Secondary | ICD-10-CM | POA: Diagnosis not present

## 2022-05-09 DIAGNOSIS — I10 Essential (primary) hypertension: Secondary | ICD-10-CM | POA: Diagnosis not present

## 2022-06-01 DIAGNOSIS — I639 Cerebral infarction, unspecified: Secondary | ICD-10-CM | POA: Diagnosis not present

## 2022-06-01 DIAGNOSIS — E1140 Type 2 diabetes mellitus with diabetic neuropathy, unspecified: Secondary | ICD-10-CM | POA: Diagnosis not present

## 2022-06-01 DIAGNOSIS — E785 Hyperlipidemia, unspecified: Secondary | ICD-10-CM | POA: Diagnosis not present

## 2022-06-01 DIAGNOSIS — I10 Essential (primary) hypertension: Secondary | ICD-10-CM | POA: Diagnosis not present

## 2022-06-03 DIAGNOSIS — Z87440 Personal history of urinary (tract) infections: Secondary | ICD-10-CM | POA: Diagnosis not present

## 2022-06-03 DIAGNOSIS — Z7902 Long term (current) use of antithrombotics/antiplatelets: Secondary | ICD-10-CM | POA: Diagnosis not present

## 2022-06-03 DIAGNOSIS — I69351 Hemiplegia and hemiparesis following cerebral infarction affecting right dominant side: Secondary | ICD-10-CM | POA: Diagnosis not present

## 2022-06-03 DIAGNOSIS — Z9181 History of falling: Secondary | ICD-10-CM | POA: Diagnosis not present

## 2022-06-03 DIAGNOSIS — E1140 Type 2 diabetes mellitus with diabetic neuropathy, unspecified: Secondary | ICD-10-CM | POA: Diagnosis not present

## 2022-06-03 DIAGNOSIS — E785 Hyperlipidemia, unspecified: Secondary | ICD-10-CM | POA: Diagnosis not present

## 2022-06-03 DIAGNOSIS — L988 Other specified disorders of the skin and subcutaneous tissue: Secondary | ICD-10-CM | POA: Diagnosis not present

## 2022-06-03 DIAGNOSIS — F32A Depression, unspecified: Secondary | ICD-10-CM | POA: Diagnosis not present

## 2022-06-03 DIAGNOSIS — Z794 Long term (current) use of insulin: Secondary | ICD-10-CM | POA: Diagnosis not present

## 2022-06-03 DIAGNOSIS — F419 Anxiety disorder, unspecified: Secondary | ICD-10-CM | POA: Diagnosis not present

## 2022-06-03 DIAGNOSIS — I5032 Chronic diastolic (congestive) heart failure: Secondary | ICD-10-CM | POA: Diagnosis not present

## 2022-06-03 DIAGNOSIS — I252 Old myocardial infarction: Secondary | ICD-10-CM | POA: Diagnosis not present

## 2022-06-03 DIAGNOSIS — E559 Vitamin D deficiency, unspecified: Secondary | ICD-10-CM | POA: Diagnosis not present

## 2022-06-03 DIAGNOSIS — I110 Hypertensive heart disease with heart failure: Secondary | ICD-10-CM | POA: Diagnosis not present

## 2022-06-03 DIAGNOSIS — J449 Chronic obstructive pulmonary disease, unspecified: Secondary | ICD-10-CM | POA: Diagnosis not present

## 2022-06-03 DIAGNOSIS — Z7984 Long term (current) use of oral hypoglycemic drugs: Secondary | ICD-10-CM | POA: Diagnosis not present

## 2022-06-07 DIAGNOSIS — E1140 Type 2 diabetes mellitus with diabetic neuropathy, unspecified: Secondary | ICD-10-CM | POA: Diagnosis not present

## 2022-06-07 DIAGNOSIS — J449 Chronic obstructive pulmonary disease, unspecified: Secondary | ICD-10-CM | POA: Diagnosis not present

## 2022-06-07 DIAGNOSIS — I110 Hypertensive heart disease with heart failure: Secondary | ICD-10-CM | POA: Diagnosis not present

## 2022-06-07 DIAGNOSIS — I69351 Hemiplegia and hemiparesis following cerebral infarction affecting right dominant side: Secondary | ICD-10-CM | POA: Diagnosis not present

## 2022-06-07 DIAGNOSIS — Z794 Long term (current) use of insulin: Secondary | ICD-10-CM | POA: Diagnosis not present

## 2022-06-07 DIAGNOSIS — I5032 Chronic diastolic (congestive) heart failure: Secondary | ICD-10-CM | POA: Diagnosis not present

## 2022-06-13 DIAGNOSIS — I69351 Hemiplegia and hemiparesis following cerebral infarction affecting right dominant side: Secondary | ICD-10-CM | POA: Diagnosis not present

## 2022-06-13 DIAGNOSIS — Z794 Long term (current) use of insulin: Secondary | ICD-10-CM | POA: Diagnosis not present

## 2022-06-13 DIAGNOSIS — E1140 Type 2 diabetes mellitus with diabetic neuropathy, unspecified: Secondary | ICD-10-CM | POA: Diagnosis not present

## 2022-06-13 DIAGNOSIS — I110 Hypertensive heart disease with heart failure: Secondary | ICD-10-CM | POA: Diagnosis not present

## 2022-06-13 DIAGNOSIS — I5032 Chronic diastolic (congestive) heart failure: Secondary | ICD-10-CM | POA: Diagnosis not present

## 2022-06-13 DIAGNOSIS — J449 Chronic obstructive pulmonary disease, unspecified: Secondary | ICD-10-CM | POA: Diagnosis not present

## 2022-06-14 DIAGNOSIS — I69351 Hemiplegia and hemiparesis following cerebral infarction affecting right dominant side: Secondary | ICD-10-CM | POA: Diagnosis not present

## 2022-06-14 DIAGNOSIS — J449 Chronic obstructive pulmonary disease, unspecified: Secondary | ICD-10-CM | POA: Diagnosis not present

## 2022-06-14 DIAGNOSIS — E1140 Type 2 diabetes mellitus with diabetic neuropathy, unspecified: Secondary | ICD-10-CM | POA: Diagnosis not present

## 2022-06-14 DIAGNOSIS — I110 Hypertensive heart disease with heart failure: Secondary | ICD-10-CM | POA: Diagnosis not present

## 2022-06-14 DIAGNOSIS — Z794 Long term (current) use of insulin: Secondary | ICD-10-CM | POA: Diagnosis not present

## 2022-06-14 DIAGNOSIS — I5032 Chronic diastolic (congestive) heart failure: Secondary | ICD-10-CM | POA: Diagnosis not present

## 2022-06-15 DIAGNOSIS — Z794 Long term (current) use of insulin: Secondary | ICD-10-CM | POA: Diagnosis not present

## 2022-06-15 DIAGNOSIS — I110 Hypertensive heart disease with heart failure: Secondary | ICD-10-CM | POA: Diagnosis not present

## 2022-06-15 DIAGNOSIS — J449 Chronic obstructive pulmonary disease, unspecified: Secondary | ICD-10-CM | POA: Diagnosis not present

## 2022-06-15 DIAGNOSIS — E1140 Type 2 diabetes mellitus with diabetic neuropathy, unspecified: Secondary | ICD-10-CM | POA: Diagnosis not present

## 2022-06-15 DIAGNOSIS — I69351 Hemiplegia and hemiparesis following cerebral infarction affecting right dominant side: Secondary | ICD-10-CM | POA: Diagnosis not present

## 2022-06-15 DIAGNOSIS — I5032 Chronic diastolic (congestive) heart failure: Secondary | ICD-10-CM | POA: Diagnosis not present

## 2022-06-16 DIAGNOSIS — J449 Chronic obstructive pulmonary disease, unspecified: Secondary | ICD-10-CM | POA: Diagnosis not present

## 2022-06-16 DIAGNOSIS — I110 Hypertensive heart disease with heart failure: Secondary | ICD-10-CM | POA: Diagnosis not present

## 2022-06-16 DIAGNOSIS — I5032 Chronic diastolic (congestive) heart failure: Secondary | ICD-10-CM | POA: Diagnosis not present

## 2022-06-16 DIAGNOSIS — E1140 Type 2 diabetes mellitus with diabetic neuropathy, unspecified: Secondary | ICD-10-CM | POA: Diagnosis not present

## 2022-06-16 DIAGNOSIS — I69351 Hemiplegia and hemiparesis following cerebral infarction affecting right dominant side: Secondary | ICD-10-CM | POA: Diagnosis not present

## 2022-06-16 DIAGNOSIS — Z794 Long term (current) use of insulin: Secondary | ICD-10-CM | POA: Diagnosis not present

## 2022-06-20 DIAGNOSIS — L988 Other specified disorders of the skin and subcutaneous tissue: Secondary | ICD-10-CM | POA: Diagnosis not present

## 2022-06-20 DIAGNOSIS — E1142 Type 2 diabetes mellitus with diabetic polyneuropathy: Secondary | ICD-10-CM | POA: Diagnosis not present

## 2022-06-20 DIAGNOSIS — R32 Unspecified urinary incontinence: Secondary | ICD-10-CM | POA: Diagnosis not present

## 2022-06-20 DIAGNOSIS — L03317 Cellulitis of buttock: Secondary | ICD-10-CM | POA: Diagnosis not present

## 2022-06-20 DIAGNOSIS — E876 Hypokalemia: Secondary | ICD-10-CM | POA: Diagnosis not present

## 2022-06-20 DIAGNOSIS — L0231 Cutaneous abscess of buttock: Secondary | ICD-10-CM | POA: Diagnosis not present

## 2022-06-20 DIAGNOSIS — N390 Urinary tract infection, site not specified: Secondary | ICD-10-CM | POA: Diagnosis not present

## 2022-06-21 DIAGNOSIS — I110 Hypertensive heart disease with heart failure: Secondary | ICD-10-CM | POA: Diagnosis not present

## 2022-06-21 DIAGNOSIS — Z794 Long term (current) use of insulin: Secondary | ICD-10-CM | POA: Diagnosis not present

## 2022-06-21 DIAGNOSIS — J449 Chronic obstructive pulmonary disease, unspecified: Secondary | ICD-10-CM | POA: Diagnosis not present

## 2022-06-21 DIAGNOSIS — I69351 Hemiplegia and hemiparesis following cerebral infarction affecting right dominant side: Secondary | ICD-10-CM | POA: Diagnosis not present

## 2022-06-21 DIAGNOSIS — I5032 Chronic diastolic (congestive) heart failure: Secondary | ICD-10-CM | POA: Diagnosis not present

## 2022-06-21 DIAGNOSIS — E1140 Type 2 diabetes mellitus with diabetic neuropathy, unspecified: Secondary | ICD-10-CM | POA: Diagnosis not present

## 2022-06-23 DIAGNOSIS — J449 Chronic obstructive pulmonary disease, unspecified: Secondary | ICD-10-CM | POA: Diagnosis not present

## 2022-06-23 DIAGNOSIS — I69351 Hemiplegia and hemiparesis following cerebral infarction affecting right dominant side: Secondary | ICD-10-CM | POA: Diagnosis not present

## 2022-06-23 DIAGNOSIS — I110 Hypertensive heart disease with heart failure: Secondary | ICD-10-CM | POA: Diagnosis not present

## 2022-06-23 DIAGNOSIS — I5032 Chronic diastolic (congestive) heart failure: Secondary | ICD-10-CM | POA: Diagnosis not present

## 2022-06-23 DIAGNOSIS — Z794 Long term (current) use of insulin: Secondary | ICD-10-CM | POA: Diagnosis not present

## 2022-06-23 DIAGNOSIS — E1140 Type 2 diabetes mellitus with diabetic neuropathy, unspecified: Secondary | ICD-10-CM | POA: Diagnosis not present

## 2022-06-27 DIAGNOSIS — J449 Chronic obstructive pulmonary disease, unspecified: Secondary | ICD-10-CM | POA: Diagnosis not present

## 2022-06-27 DIAGNOSIS — E1140 Type 2 diabetes mellitus with diabetic neuropathy, unspecified: Secondary | ICD-10-CM | POA: Diagnosis not present

## 2022-06-27 DIAGNOSIS — I110 Hypertensive heart disease with heart failure: Secondary | ICD-10-CM | POA: Diagnosis not present

## 2022-06-27 DIAGNOSIS — I69351 Hemiplegia and hemiparesis following cerebral infarction affecting right dominant side: Secondary | ICD-10-CM | POA: Diagnosis not present

## 2022-06-27 DIAGNOSIS — Z794 Long term (current) use of insulin: Secondary | ICD-10-CM | POA: Diagnosis not present

## 2022-06-27 DIAGNOSIS — I5032 Chronic diastolic (congestive) heart failure: Secondary | ICD-10-CM | POA: Diagnosis not present

## 2022-06-30 DIAGNOSIS — E1140 Type 2 diabetes mellitus with diabetic neuropathy, unspecified: Secondary | ICD-10-CM | POA: Diagnosis not present

## 2022-06-30 DIAGNOSIS — I69351 Hemiplegia and hemiparesis following cerebral infarction affecting right dominant side: Secondary | ICD-10-CM | POA: Diagnosis not present

## 2022-06-30 DIAGNOSIS — I110 Hypertensive heart disease with heart failure: Secondary | ICD-10-CM | POA: Diagnosis not present

## 2022-06-30 DIAGNOSIS — I5032 Chronic diastolic (congestive) heart failure: Secondary | ICD-10-CM | POA: Diagnosis not present

## 2022-06-30 DIAGNOSIS — J449 Chronic obstructive pulmonary disease, unspecified: Secondary | ICD-10-CM | POA: Diagnosis not present

## 2022-06-30 DIAGNOSIS — Z794 Long term (current) use of insulin: Secondary | ICD-10-CM | POA: Diagnosis not present

## 2022-07-03 DIAGNOSIS — I5032 Chronic diastolic (congestive) heart failure: Secondary | ICD-10-CM | POA: Diagnosis not present

## 2022-07-03 DIAGNOSIS — F32A Depression, unspecified: Secondary | ICD-10-CM | POA: Diagnosis not present

## 2022-07-03 DIAGNOSIS — Z7902 Long term (current) use of antithrombotics/antiplatelets: Secondary | ICD-10-CM | POA: Diagnosis not present

## 2022-07-03 DIAGNOSIS — F419 Anxiety disorder, unspecified: Secondary | ICD-10-CM | POA: Diagnosis not present

## 2022-07-03 DIAGNOSIS — I110 Hypertensive heart disease with heart failure: Secondary | ICD-10-CM | POA: Diagnosis not present

## 2022-07-03 DIAGNOSIS — Z87440 Personal history of urinary (tract) infections: Secondary | ICD-10-CM | POA: Diagnosis not present

## 2022-07-03 DIAGNOSIS — I69351 Hemiplegia and hemiparesis following cerebral infarction affecting right dominant side: Secondary | ICD-10-CM | POA: Diagnosis not present

## 2022-07-03 DIAGNOSIS — L988 Other specified disorders of the skin and subcutaneous tissue: Secondary | ICD-10-CM | POA: Diagnosis not present

## 2022-07-03 DIAGNOSIS — Z794 Long term (current) use of insulin: Secondary | ICD-10-CM | POA: Diagnosis not present

## 2022-07-03 DIAGNOSIS — E559 Vitamin D deficiency, unspecified: Secondary | ICD-10-CM | POA: Diagnosis not present

## 2022-07-03 DIAGNOSIS — E785 Hyperlipidemia, unspecified: Secondary | ICD-10-CM | POA: Diagnosis not present

## 2022-07-03 DIAGNOSIS — I252 Old myocardial infarction: Secondary | ICD-10-CM | POA: Diagnosis not present

## 2022-07-03 DIAGNOSIS — Z7984 Long term (current) use of oral hypoglycemic drugs: Secondary | ICD-10-CM | POA: Diagnosis not present

## 2022-07-03 DIAGNOSIS — J449 Chronic obstructive pulmonary disease, unspecified: Secondary | ICD-10-CM | POA: Diagnosis not present

## 2022-07-03 DIAGNOSIS — E1140 Type 2 diabetes mellitus with diabetic neuropathy, unspecified: Secondary | ICD-10-CM | POA: Diagnosis not present

## 2022-07-03 DIAGNOSIS — Z9181 History of falling: Secondary | ICD-10-CM | POA: Diagnosis not present

## 2022-07-04 DIAGNOSIS — L602 Onychogryphosis: Secondary | ICD-10-CM | POA: Diagnosis not present

## 2022-07-05 DIAGNOSIS — I5032 Chronic diastolic (congestive) heart failure: Secondary | ICD-10-CM | POA: Diagnosis not present

## 2022-07-05 DIAGNOSIS — J449 Chronic obstructive pulmonary disease, unspecified: Secondary | ICD-10-CM | POA: Diagnosis not present

## 2022-07-05 DIAGNOSIS — I110 Hypertensive heart disease with heart failure: Secondary | ICD-10-CM | POA: Diagnosis not present

## 2022-07-05 DIAGNOSIS — Z794 Long term (current) use of insulin: Secondary | ICD-10-CM | POA: Diagnosis not present

## 2022-07-05 DIAGNOSIS — I69351 Hemiplegia and hemiparesis following cerebral infarction affecting right dominant side: Secondary | ICD-10-CM | POA: Diagnosis not present

## 2022-07-05 DIAGNOSIS — E1140 Type 2 diabetes mellitus with diabetic neuropathy, unspecified: Secondary | ICD-10-CM | POA: Diagnosis not present

## 2022-07-07 DIAGNOSIS — I5032 Chronic diastolic (congestive) heart failure: Secondary | ICD-10-CM | POA: Diagnosis not present

## 2022-07-07 DIAGNOSIS — I110 Hypertensive heart disease with heart failure: Secondary | ICD-10-CM | POA: Diagnosis not present

## 2022-07-07 DIAGNOSIS — E1140 Type 2 diabetes mellitus with diabetic neuropathy, unspecified: Secondary | ICD-10-CM | POA: Diagnosis not present

## 2022-07-07 DIAGNOSIS — Z794 Long term (current) use of insulin: Secondary | ICD-10-CM | POA: Diagnosis not present

## 2022-07-07 DIAGNOSIS — I69351 Hemiplegia and hemiparesis following cerebral infarction affecting right dominant side: Secondary | ICD-10-CM | POA: Diagnosis not present

## 2022-07-07 DIAGNOSIS — J449 Chronic obstructive pulmonary disease, unspecified: Secondary | ICD-10-CM | POA: Diagnosis not present

## 2022-07-10 DIAGNOSIS — Z794 Long term (current) use of insulin: Secondary | ICD-10-CM | POA: Diagnosis not present

## 2022-07-10 DIAGNOSIS — E1140 Type 2 diabetes mellitus with diabetic neuropathy, unspecified: Secondary | ICD-10-CM | POA: Diagnosis not present

## 2022-07-10 DIAGNOSIS — I5032 Chronic diastolic (congestive) heart failure: Secondary | ICD-10-CM | POA: Diagnosis not present

## 2022-07-10 DIAGNOSIS — J449 Chronic obstructive pulmonary disease, unspecified: Secondary | ICD-10-CM | POA: Diagnosis not present

## 2022-07-10 DIAGNOSIS — I110 Hypertensive heart disease with heart failure: Secondary | ICD-10-CM | POA: Diagnosis not present

## 2022-07-10 DIAGNOSIS — I69351 Hemiplegia and hemiparesis following cerebral infarction affecting right dominant side: Secondary | ICD-10-CM | POA: Diagnosis not present

## 2022-07-12 DIAGNOSIS — E1140 Type 2 diabetes mellitus with diabetic neuropathy, unspecified: Secondary | ICD-10-CM | POA: Diagnosis not present

## 2022-07-12 DIAGNOSIS — I69351 Hemiplegia and hemiparesis following cerebral infarction affecting right dominant side: Secondary | ICD-10-CM | POA: Diagnosis not present

## 2022-07-12 DIAGNOSIS — J449 Chronic obstructive pulmonary disease, unspecified: Secondary | ICD-10-CM | POA: Diagnosis not present

## 2022-07-12 DIAGNOSIS — I110 Hypertensive heart disease with heart failure: Secondary | ICD-10-CM | POA: Diagnosis not present

## 2022-07-12 DIAGNOSIS — I5032 Chronic diastolic (congestive) heart failure: Secondary | ICD-10-CM | POA: Diagnosis not present

## 2022-07-12 DIAGNOSIS — Z794 Long term (current) use of insulin: Secondary | ICD-10-CM | POA: Diagnosis not present

## 2022-07-19 DIAGNOSIS — I5032 Chronic diastolic (congestive) heart failure: Secondary | ICD-10-CM | POA: Diagnosis not present

## 2022-07-19 DIAGNOSIS — E1140 Type 2 diabetes mellitus with diabetic neuropathy, unspecified: Secondary | ICD-10-CM | POA: Diagnosis not present

## 2022-07-19 DIAGNOSIS — I110 Hypertensive heart disease with heart failure: Secondary | ICD-10-CM | POA: Diagnosis not present

## 2022-07-19 DIAGNOSIS — I69351 Hemiplegia and hemiparesis following cerebral infarction affecting right dominant side: Secondary | ICD-10-CM | POA: Diagnosis not present

## 2022-07-19 DIAGNOSIS — J449 Chronic obstructive pulmonary disease, unspecified: Secondary | ICD-10-CM | POA: Diagnosis not present

## 2022-07-19 DIAGNOSIS — Z794 Long term (current) use of insulin: Secondary | ICD-10-CM | POA: Diagnosis not present
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
## 2022-07-21 ENCOUNTER — Ambulatory Visit: Payer: Medicare Other | Attending: Cardiology | Admitting: Cardiology

## 2022-07-21 ENCOUNTER — Encounter: Payer: Self-pay | Admitting: Cardiology

## 2022-07-21 VITALS — BP 140/74 | HR 98 | Ht 63.0 in | Wt 160.0 lb

## 2022-07-21 DIAGNOSIS — J449 Chronic obstructive pulmonary disease, unspecified: Secondary | ICD-10-CM | POA: Insufficient documentation

## 2022-07-21 DIAGNOSIS — I110 Hypertensive heart disease with heart failure: Secondary | ICD-10-CM | POA: Insufficient documentation

## 2022-07-21 DIAGNOSIS — I25118 Atherosclerotic heart disease of native coronary artery with other forms of angina pectoris: Secondary | ICD-10-CM | POA: Diagnosis not present

## 2022-07-21 DIAGNOSIS — E782 Mixed hyperlipidemia: Secondary | ICD-10-CM | POA: Diagnosis not present

## 2022-07-21 NOTE — Patient Instructions (Signed)
Medication Instructions:  Your physician recommends that you continue on your current medications as directed. Please refer to the Current Medication list given to you today.  *If you need a refill on your cardiac medications before your next appointment, please call your pharmacy*   Lab Work: None If you have labs (blood work) drawn today and your tests are completely normal, you will receive your results only by: MyChart Message (if you have MyChart) OR A paper copy in the mail If you have any lab test that is abnormal or we need to change your treatment, we will call you to review the results.   Testing/Procedures: None   Follow-Up: At [facility], you and your health needs are our priority.  As part of our continuing mission to provide you with exceptional heart care, we have created designated Provider Care Teams.  These Care Teams include your primary Cardiologist (physician) and Advanced Practice Providers (APPs -  Physician Assistants and Nurse Practitioners) who all work together to provide you with the care you need, when you need it.  We recommend signing up for the patient portal called "MyChart".  Sign up information is provided on this After Visit Summary.  MyChart is used to connect with patients for Virtual Visits (Telemedicine).  Patients are able to view lab/test results, encounter notes, upcoming appointments, etc.  Non-urgent messages can be sent to your provider as well.   To learn more about what you can do with MyChart, go to [email or web address].    Your next appointment:   6 month(s)  Provider:   Brian Munley, MD    Other Instructions None  

## 2022-07-26 DIAGNOSIS — I110 Hypertensive heart disease with heart failure: Secondary | ICD-10-CM | POA: Diagnosis not present

## 2022-07-26 DIAGNOSIS — E1140 Type 2 diabetes mellitus with diabetic neuropathy, unspecified: Secondary | ICD-10-CM | POA: Diagnosis not present

## 2022-07-26 DIAGNOSIS — I69351 Hemiplegia and hemiparesis following cerebral infarction affecting right dominant side: Secondary | ICD-10-CM | POA: Diagnosis not present

## 2022-07-26 DIAGNOSIS — Z794 Long term (current) use of insulin: Secondary | ICD-10-CM | POA: Diagnosis not present

## 2022-07-26 DIAGNOSIS — I5032 Chronic diastolic (congestive) heart failure: Secondary | ICD-10-CM | POA: Diagnosis not present

## 2022-07-26 DIAGNOSIS — J449 Chronic obstructive pulmonary disease, unspecified: Secondary | ICD-10-CM | POA: Diagnosis not present

## 2022-07-27 DIAGNOSIS — J449 Chronic obstructive pulmonary disease, unspecified: Secondary | ICD-10-CM | POA: Diagnosis not present

## 2022-07-27 DIAGNOSIS — E1140 Type 2 diabetes mellitus with diabetic neuropathy, unspecified: Secondary | ICD-10-CM | POA: Diagnosis not present

## 2022-07-27 DIAGNOSIS — I110 Hypertensive heart disease with heart failure: Secondary | ICD-10-CM | POA: Diagnosis not present

## 2022-07-27 DIAGNOSIS — Z794 Long term (current) use of insulin: Secondary | ICD-10-CM | POA: Diagnosis not present

## 2022-07-27 DIAGNOSIS — I69351 Hemiplegia and hemiparesis following cerebral infarction affecting right dominant side: Secondary | ICD-10-CM | POA: Diagnosis not present

## 2022-07-27 DIAGNOSIS — I5032 Chronic diastolic (congestive) heart failure: Secondary | ICD-10-CM | POA: Diagnosis not present

## 2022-07-28 DIAGNOSIS — I5032 Chronic diastolic (congestive) heart failure: Secondary | ICD-10-CM | POA: Diagnosis not present

## 2022-07-28 DIAGNOSIS — E1140 Type 2 diabetes mellitus with diabetic neuropathy, unspecified: Secondary | ICD-10-CM | POA: Diagnosis not present

## 2022-07-28 DIAGNOSIS — Z794 Long term (current) use of insulin: Secondary | ICD-10-CM | POA: Diagnosis not present

## 2022-07-28 DIAGNOSIS — I110 Hypertensive heart disease with heart failure: Secondary | ICD-10-CM | POA: Diagnosis not present

## 2022-07-28 DIAGNOSIS — J449 Chronic obstructive pulmonary disease, unspecified: Secondary | ICD-10-CM | POA: Diagnosis not present

## 2022-07-28 DIAGNOSIS — I69351 Hemiplegia and hemiparesis following cerebral infarction affecting right dominant side: Secondary | ICD-10-CM | POA: Diagnosis not present

## 2022-08-01 DIAGNOSIS — J449 Chronic obstructive pulmonary disease, unspecified: Secondary | ICD-10-CM | POA: Diagnosis not present

## 2022-08-01 DIAGNOSIS — I110 Hypertensive heart disease with heart failure: Secondary | ICD-10-CM | POA: Diagnosis not present

## 2022-08-01 DIAGNOSIS — I5032 Chronic diastolic (congestive) heart failure: Secondary | ICD-10-CM | POA: Diagnosis not present

## 2022-08-01 DIAGNOSIS — I69351 Hemiplegia and hemiparesis following cerebral infarction affecting right dominant side: Secondary | ICD-10-CM | POA: Diagnosis not present

## 2022-08-01 DIAGNOSIS — Z794 Long term (current) use of insulin: Secondary | ICD-10-CM | POA: Diagnosis not present

## 2022-08-01 DIAGNOSIS — E1140 Type 2 diabetes mellitus with diabetic neuropathy, unspecified: Secondary | ICD-10-CM | POA: Diagnosis not present

## 2022-08-02 DIAGNOSIS — I252 Old myocardial infarction: Secondary | ICD-10-CM | POA: Diagnosis not present

## 2022-08-02 DIAGNOSIS — I10 Essential (primary) hypertension: Secondary | ICD-10-CM | POA: Diagnosis not present

## 2022-08-02 DIAGNOSIS — E559 Vitamin D deficiency, unspecified: Secondary | ICD-10-CM | POA: Diagnosis not present

## 2022-08-02 DIAGNOSIS — Z794 Long term (current) use of insulin: Secondary | ICD-10-CM | POA: Diagnosis not present

## 2022-08-02 DIAGNOSIS — J449 Chronic obstructive pulmonary disease, unspecified: Secondary | ICD-10-CM | POA: Diagnosis not present

## 2022-08-02 DIAGNOSIS — Z9181 History of falling: Secondary | ICD-10-CM | POA: Diagnosis not present

## 2022-08-02 DIAGNOSIS — I69351 Hemiplegia and hemiparesis following cerebral infarction affecting right dominant side: Secondary | ICD-10-CM | POA: Diagnosis not present

## 2022-08-02 DIAGNOSIS — Z7984 Long term (current) use of oral hypoglycemic drugs: Secondary | ICD-10-CM | POA: Diagnosis not present

## 2022-08-02 DIAGNOSIS — Z87440 Personal history of urinary (tract) infections: Secondary | ICD-10-CM | POA: Diagnosis not present

## 2022-08-02 DIAGNOSIS — E1140 Type 2 diabetes mellitus with diabetic neuropathy, unspecified: Secondary | ICD-10-CM | POA: Diagnosis not present

## 2022-08-02 DIAGNOSIS — E785 Hyperlipidemia, unspecified: Secondary | ICD-10-CM | POA: Diagnosis not present

## 2022-08-02 DIAGNOSIS — L988 Other specified disorders of the skin and subcutaneous tissue: Secondary | ICD-10-CM | POA: Diagnosis not present

## 2022-08-02 DIAGNOSIS — F419 Anxiety disorder, unspecified: Secondary | ICD-10-CM | POA: Diagnosis not present

## 2022-08-02 DIAGNOSIS — I5032 Chronic diastolic (congestive) heart failure: Secondary | ICD-10-CM | POA: Diagnosis not present

## 2022-08-02 DIAGNOSIS — I110 Hypertensive heart disease with heart failure: Secondary | ICD-10-CM | POA: Diagnosis not present

## 2022-08-02 DIAGNOSIS — F32A Depression, unspecified: Secondary | ICD-10-CM | POA: Diagnosis not present

## 2022-08-02 DIAGNOSIS — Z7902 Long term (current) use of antithrombotics/antiplatelets: Secondary | ICD-10-CM | POA: Diagnosis not present

## 2022-08-09 DIAGNOSIS — E1140 Type 2 diabetes mellitus with diabetic neuropathy, unspecified: Secondary | ICD-10-CM | POA: Diagnosis not present

## 2022-08-09 DIAGNOSIS — J449 Chronic obstructive pulmonary disease, unspecified: Secondary | ICD-10-CM | POA: Diagnosis not present

## 2022-08-09 DIAGNOSIS — I110 Hypertensive heart disease with heart failure: Secondary | ICD-10-CM | POA: Diagnosis not present

## 2022-08-09 DIAGNOSIS — I5032 Chronic diastolic (congestive) heart failure: Secondary | ICD-10-CM | POA: Diagnosis not present

## 2022-08-09 DIAGNOSIS — I69351 Hemiplegia and hemiparesis following cerebral infarction affecting right dominant side: Secondary | ICD-10-CM | POA: Diagnosis not present

## 2022-08-09 DIAGNOSIS — Z794 Long term (current) use of insulin: Secondary | ICD-10-CM | POA: Diagnosis not present

## 2022-08-10 DIAGNOSIS — R300 Dysuria: Secondary | ICD-10-CM | POA: Diagnosis not present

## 2022-08-10 DIAGNOSIS — S31819A Unspecified open wound of right buttock, initial encounter: Secondary | ICD-10-CM | POA: Diagnosis not present

## 2022-08-10 DIAGNOSIS — S31829A Unspecified open wound of left buttock, initial encounter: Secondary | ICD-10-CM | POA: Diagnosis not present

## 2022-08-10 DIAGNOSIS — R32 Unspecified urinary incontinence: Secondary | ICD-10-CM | POA: Diagnosis not present

## 2022-08-14 DIAGNOSIS — Z794 Long term (current) use of insulin: Secondary | ICD-10-CM | POA: Diagnosis not present

## 2022-08-14 DIAGNOSIS — I69351 Hemiplegia and hemiparesis following cerebral infarction affecting right dominant side: Secondary | ICD-10-CM | POA: Diagnosis not present

## 2022-08-14 DIAGNOSIS — E1140 Type 2 diabetes mellitus with diabetic neuropathy, unspecified: Secondary | ICD-10-CM | POA: Diagnosis not present

## 2022-08-14 DIAGNOSIS — J449 Chronic obstructive pulmonary disease, unspecified: Secondary | ICD-10-CM | POA: Diagnosis not present

## 2022-08-14 DIAGNOSIS — I110 Hypertensive heart disease with heart failure: Secondary | ICD-10-CM | POA: Diagnosis not present

## 2022-08-14 DIAGNOSIS — I5032 Chronic diastolic (congestive) heart failure: Secondary | ICD-10-CM | POA: Diagnosis not present

## 2022-08-16 DIAGNOSIS — I69351 Hemiplegia and hemiparesis following cerebral infarction affecting right dominant side: Secondary | ICD-10-CM | POA: Diagnosis not present

## 2022-08-16 DIAGNOSIS — E1140 Type 2 diabetes mellitus with diabetic neuropathy, unspecified: Secondary | ICD-10-CM | POA: Diagnosis not present

## 2022-08-16 DIAGNOSIS — I110 Hypertensive heart disease with heart failure: Secondary | ICD-10-CM | POA: Diagnosis not present

## 2022-08-16 DIAGNOSIS — J449 Chronic obstructive pulmonary disease, unspecified: Secondary | ICD-10-CM | POA: Diagnosis not present

## 2022-08-16 DIAGNOSIS — Z794 Long term (current) use of insulin: Secondary | ICD-10-CM | POA: Diagnosis not present

## 2022-08-16 DIAGNOSIS — I5032 Chronic diastolic (congestive) heart failure: Secondary | ICD-10-CM | POA: Diagnosis not present

## 2022-08-18 DIAGNOSIS — I110 Hypertensive heart disease with heart failure: Secondary | ICD-10-CM | POA: Diagnosis not present

## 2022-08-18 DIAGNOSIS — I69351 Hemiplegia and hemiparesis following cerebral infarction affecting right dominant side: Secondary | ICD-10-CM | POA: Diagnosis not present

## 2022-08-18 DIAGNOSIS — E1140 Type 2 diabetes mellitus with diabetic neuropathy, unspecified: Secondary | ICD-10-CM | POA: Diagnosis not present

## 2022-08-18 DIAGNOSIS — I5032 Chronic diastolic (congestive) heart failure: Secondary | ICD-10-CM | POA: Diagnosis not present

## 2022-08-18 DIAGNOSIS — Z794 Long term (current) use of insulin: Secondary | ICD-10-CM | POA: Diagnosis not present

## 2022-08-18 DIAGNOSIS — J449 Chronic obstructive pulmonary disease, unspecified: Secondary | ICD-10-CM | POA: Diagnosis not present

## 2022-08-21 DIAGNOSIS — I69351 Hemiplegia and hemiparesis following cerebral infarction affecting right dominant side: Secondary | ICD-10-CM | POA: Diagnosis not present

## 2022-08-21 DIAGNOSIS — I110 Hypertensive heart disease with heart failure: Secondary | ICD-10-CM | POA: Diagnosis not present

## 2022-08-21 DIAGNOSIS — J449 Chronic obstructive pulmonary disease, unspecified: Secondary | ICD-10-CM | POA: Diagnosis not present

## 2022-08-21 DIAGNOSIS — I5032 Chronic diastolic (congestive) heart failure: Secondary | ICD-10-CM | POA: Diagnosis not present

## 2022-08-21 DIAGNOSIS — E1140 Type 2 diabetes mellitus with diabetic neuropathy, unspecified: Secondary | ICD-10-CM | POA: Diagnosis not present

## 2022-08-21 DIAGNOSIS — Z794 Long term (current) use of insulin: Secondary | ICD-10-CM | POA: Diagnosis not present

## 2022-08-22 DIAGNOSIS — E785 Hyperlipidemia, unspecified: Secondary | ICD-10-CM | POA: Diagnosis not present

## 2022-08-22 DIAGNOSIS — I110 Hypertensive heart disease with heart failure: Secondary | ICD-10-CM | POA: Diagnosis not present

## 2022-08-22 DIAGNOSIS — G8191 Hemiplegia, unspecified affecting right dominant side: Secondary | ICD-10-CM | POA: Diagnosis not present

## 2022-08-22 DIAGNOSIS — E1142 Type 2 diabetes mellitus with diabetic polyneuropathy: Secondary | ICD-10-CM | POA: Diagnosis not present

## 2022-08-22 DIAGNOSIS — I5030 Unspecified diastolic (congestive) heart failure: Secondary | ICD-10-CM | POA: Diagnosis not present

## 2022-08-22 DIAGNOSIS — Z1231 Encounter for screening mammogram for malignant neoplasm of breast: Secondary | ICD-10-CM | POA: Diagnosis not present

## 2022-08-22 DIAGNOSIS — I5032 Chronic diastolic (congestive) heart failure: Secondary | ICD-10-CM | POA: Diagnosis not present

## 2022-08-22 DIAGNOSIS — D519 Vitamin B12 deficiency anemia, unspecified: Secondary | ICD-10-CM | POA: Diagnosis not present

## 2022-08-23 DIAGNOSIS — I110 Hypertensive heart disease with heart failure: Secondary | ICD-10-CM | POA: Diagnosis not present

## 2022-08-23 DIAGNOSIS — I69351 Hemiplegia and hemiparesis following cerebral infarction affecting right dominant side: Secondary | ICD-10-CM | POA: Diagnosis not present

## 2022-08-23 DIAGNOSIS — Z794 Long term (current) use of insulin: Secondary | ICD-10-CM | POA: Diagnosis not present

## 2022-08-23 DIAGNOSIS — J449 Chronic obstructive pulmonary disease, unspecified: Secondary | ICD-10-CM | POA: Diagnosis not present

## 2022-08-23 DIAGNOSIS — I5032 Chronic diastolic (congestive) heart failure: Secondary | ICD-10-CM | POA: Diagnosis not present

## 2022-08-23 DIAGNOSIS — E1140 Type 2 diabetes mellitus with diabetic neuropathy, unspecified: Secondary | ICD-10-CM | POA: Diagnosis not present

## 2022-08-25 DIAGNOSIS — I5032 Chronic diastolic (congestive) heart failure: Secondary | ICD-10-CM | POA: Diagnosis not present

## 2022-08-25 DIAGNOSIS — I110 Hypertensive heart disease with heart failure: Secondary | ICD-10-CM | POA: Diagnosis not present

## 2022-08-25 DIAGNOSIS — Z794 Long term (current) use of insulin: Secondary | ICD-10-CM | POA: Diagnosis not present

## 2022-08-25 DIAGNOSIS — J449 Chronic obstructive pulmonary disease, unspecified: Secondary | ICD-10-CM | POA: Diagnosis not present

## 2022-08-25 DIAGNOSIS — I69351 Hemiplegia and hemiparesis following cerebral infarction affecting right dominant side: Secondary | ICD-10-CM | POA: Diagnosis not present

## 2022-08-25 DIAGNOSIS — E1140 Type 2 diabetes mellitus with diabetic neuropathy, unspecified: Secondary | ICD-10-CM | POA: Diagnosis not present

## 2022-08-28 DIAGNOSIS — J449 Chronic obstructive pulmonary disease, unspecified: Secondary | ICD-10-CM | POA: Diagnosis not present

## 2022-08-28 DIAGNOSIS — E1140 Type 2 diabetes mellitus with diabetic neuropathy, unspecified: Secondary | ICD-10-CM | POA: Diagnosis not present

## 2022-08-28 DIAGNOSIS — I69351 Hemiplegia and hemiparesis following cerebral infarction affecting right dominant side: Secondary | ICD-10-CM | POA: Diagnosis not present

## 2022-08-28 DIAGNOSIS — I110 Hypertensive heart disease with heart failure: Secondary | ICD-10-CM | POA: Diagnosis not present

## 2022-08-28 DIAGNOSIS — I5032 Chronic diastolic (congestive) heart failure: Secondary | ICD-10-CM | POA: Diagnosis not present

## 2022-08-28 DIAGNOSIS — Z794 Long term (current) use of insulin: Secondary | ICD-10-CM | POA: Diagnosis not present

## 2022-08-30 DIAGNOSIS — I69351 Hemiplegia and hemiparesis following cerebral infarction affecting right dominant side: Secondary | ICD-10-CM | POA: Diagnosis not present

## 2022-08-30 DIAGNOSIS — Z794 Long term (current) use of insulin: Secondary | ICD-10-CM | POA: Diagnosis not present

## 2022-08-30 DIAGNOSIS — J449 Chronic obstructive pulmonary disease, unspecified: Secondary | ICD-10-CM | POA: Diagnosis not present

## 2022-08-30 DIAGNOSIS — I5032 Chronic diastolic (congestive) heart failure: Secondary | ICD-10-CM | POA: Diagnosis not present

## 2022-08-30 DIAGNOSIS — E1140 Type 2 diabetes mellitus with diabetic neuropathy, unspecified: Secondary | ICD-10-CM | POA: Diagnosis not present

## 2022-08-30 DIAGNOSIS — I110 Hypertensive heart disease with heart failure: Secondary | ICD-10-CM | POA: Diagnosis not present

## 2022-09-01 DIAGNOSIS — I69351 Hemiplegia and hemiparesis following cerebral infarction affecting right dominant side: Secondary | ICD-10-CM | POA: Diagnosis not present

## 2022-09-01 DIAGNOSIS — E785 Hyperlipidemia, unspecified: Secondary | ICD-10-CM | POA: Diagnosis not present

## 2022-09-01 DIAGNOSIS — Z7902 Long term (current) use of antithrombotics/antiplatelets: Secondary | ICD-10-CM | POA: Diagnosis not present

## 2022-09-01 DIAGNOSIS — J449 Chronic obstructive pulmonary disease, unspecified: Secondary | ICD-10-CM | POA: Diagnosis not present

## 2022-09-01 DIAGNOSIS — F32A Depression, unspecified: Secondary | ICD-10-CM | POA: Diagnosis not present

## 2022-09-01 DIAGNOSIS — I110 Hypertensive heart disease with heart failure: Secondary | ICD-10-CM | POA: Diagnosis not present

## 2022-09-01 DIAGNOSIS — E559 Vitamin D deficiency, unspecified: Secondary | ICD-10-CM | POA: Diagnosis not present

## 2022-09-01 DIAGNOSIS — E1140 Type 2 diabetes mellitus with diabetic neuropathy, unspecified: Secondary | ICD-10-CM | POA: Diagnosis not present

## 2022-09-01 DIAGNOSIS — Z794 Long term (current) use of insulin: Secondary | ICD-10-CM | POA: Diagnosis not present

## 2022-09-01 DIAGNOSIS — I10 Essential (primary) hypertension: Secondary | ICD-10-CM | POA: Diagnosis not present

## 2022-09-01 DIAGNOSIS — Z87440 Personal history of urinary (tract) infections: Secondary | ICD-10-CM | POA: Diagnosis not present

## 2022-09-01 DIAGNOSIS — Z7984 Long term (current) use of oral hypoglycemic drugs: Secondary | ICD-10-CM | POA: Diagnosis not present

## 2022-09-01 DIAGNOSIS — L988 Other specified disorders of the skin and subcutaneous tissue: Secondary | ICD-10-CM | POA: Diagnosis not present

## 2022-09-01 DIAGNOSIS — Z9181 History of falling: Secondary | ICD-10-CM | POA: Diagnosis not present

## 2022-09-01 DIAGNOSIS — F419 Anxiety disorder, unspecified: Secondary | ICD-10-CM | POA: Diagnosis not present

## 2022-09-01 DIAGNOSIS — I252 Old myocardial infarction: Secondary | ICD-10-CM | POA: Diagnosis not present

## 2022-09-01 DIAGNOSIS — I5032 Chronic diastolic (congestive) heart failure: Secondary | ICD-10-CM | POA: Diagnosis not present

## 2022-09-21 DIAGNOSIS — I69351 Hemiplegia and hemiparesis following cerebral infarction affecting right dominant side: Secondary | ICD-10-CM | POA: Diagnosis not present

## 2022-09-21 DIAGNOSIS — M5136 Other intervertebral disc degeneration, lumbar region: Secondary | ICD-10-CM | POA: Diagnosis not present

## 2022-09-21 DIAGNOSIS — I890 Lymphedema, not elsewhere classified: Secondary | ICD-10-CM | POA: Diagnosis not present

## 2022-09-21 DIAGNOSIS — Z6836 Body mass index (BMI) 36.0-36.9, adult: Secondary | ICD-10-CM | POA: Diagnosis not present

## 2022-09-21 DIAGNOSIS — E1142 Type 2 diabetes mellitus with diabetic polyneuropathy: Secondary | ICD-10-CM | POA: Diagnosis not present

## 2022-09-21 DIAGNOSIS — I5032 Chronic diastolic (congestive) heart failure: Secondary | ICD-10-CM | POA: Diagnosis not present

## 2022-09-21 DIAGNOSIS — M47817 Spondylosis without myelopathy or radiculopathy, lumbosacral region: Secondary | ICD-10-CM | POA: Diagnosis not present

## 2022-09-21 DIAGNOSIS — D519 Vitamin B12 deficiency anemia, unspecified: Secondary | ICD-10-CM | POA: Diagnosis not present

## 2022-09-21 DIAGNOSIS — I6521 Occlusion and stenosis of right carotid artery: Secondary | ICD-10-CM | POA: Diagnosis not present

## 2022-09-21 DIAGNOSIS — Z7902 Long term (current) use of antithrombotics/antiplatelets: Secondary | ICD-10-CM | POA: Diagnosis not present

## 2022-09-21 DIAGNOSIS — E785 Hyperlipidemia, unspecified: Secondary | ICD-10-CM | POA: Diagnosis not present

## 2022-09-21 DIAGNOSIS — M810 Age-related osteoporosis without current pathological fracture: Secondary | ICD-10-CM | POA: Diagnosis not present

## 2022-09-21 DIAGNOSIS — Z9049 Acquired absence of other specified parts of digestive tract: Secondary | ICD-10-CM | POA: Diagnosis not present

## 2022-09-21 DIAGNOSIS — I110 Hypertensive heart disease with heart failure: Secondary | ICD-10-CM | POA: Diagnosis not present

## 2022-09-21 DIAGNOSIS — F3341 Major depressive disorder, recurrent, in partial remission: Secondary | ICD-10-CM | POA: Diagnosis not present

## 2022-09-21 DIAGNOSIS — I69392 Facial weakness following cerebral infarction: Secondary | ICD-10-CM | POA: Diagnosis not present

## 2022-09-21 DIAGNOSIS — G2581 Restless legs syndrome: Secondary | ICD-10-CM | POA: Diagnosis not present

## 2022-09-21 DIAGNOSIS — Z7982 Long term (current) use of aspirin: Secondary | ICD-10-CM | POA: Diagnosis not present

## 2022-09-26 DIAGNOSIS — E1142 Type 2 diabetes mellitus with diabetic polyneuropathy: Secondary | ICD-10-CM | POA: Diagnosis not present

## 2022-09-26 DIAGNOSIS — I69392 Facial weakness following cerebral infarction: Secondary | ICD-10-CM | POA: Diagnosis not present

## 2022-09-26 DIAGNOSIS — M47817 Spondylosis without myelopathy or radiculopathy, lumbosacral region: Secondary | ICD-10-CM | POA: Diagnosis not present

## 2022-09-26 DIAGNOSIS — I110 Hypertensive heart disease with heart failure: Secondary | ICD-10-CM | POA: Diagnosis not present

## 2022-09-26 DIAGNOSIS — I69351 Hemiplegia and hemiparesis following cerebral infarction affecting right dominant side: Secondary | ICD-10-CM | POA: Diagnosis not present

## 2022-09-26 DIAGNOSIS — I5032 Chronic diastolic (congestive) heart failure: Secondary | ICD-10-CM | POA: Diagnosis not present

## 2022-09-29 ENCOUNTER — Other Ambulatory Visit: Payer: Self-pay | Admitting: *Deleted

## 2022-09-29 DIAGNOSIS — M47817 Spondylosis without myelopathy or radiculopathy, lumbosacral region: Secondary | ICD-10-CM | POA: Diagnosis not present

## 2022-09-29 DIAGNOSIS — I6521 Occlusion and stenosis of right carotid artery: Secondary | ICD-10-CM

## 2022-09-29 DIAGNOSIS — I110 Hypertensive heart disease with heart failure: Secondary | ICD-10-CM | POA: Diagnosis not present

## 2022-09-29 DIAGNOSIS — I69392 Facial weakness following cerebral infarction: Secondary | ICD-10-CM | POA: Diagnosis not present

## 2022-09-29 DIAGNOSIS — I5032 Chronic diastolic (congestive) heart failure: Secondary | ICD-10-CM | POA: Diagnosis not present

## 2022-09-29 DIAGNOSIS — E1142 Type 2 diabetes mellitus with diabetic polyneuropathy: Secondary | ICD-10-CM | POA: Diagnosis not present

## 2022-09-29 DIAGNOSIS — I69351 Hemiplegia and hemiparesis following cerebral infarction affecting right dominant side: Secondary | ICD-10-CM | POA: Diagnosis not present

## 2022-10-03 DIAGNOSIS — I110 Hypertensive heart disease with heart failure: Secondary | ICD-10-CM | POA: Diagnosis not present

## 2022-10-03 DIAGNOSIS — I5032 Chronic diastolic (congestive) heart failure: Secondary | ICD-10-CM | POA: Diagnosis not present

## 2022-10-03 DIAGNOSIS — I69351 Hemiplegia and hemiparesis following cerebral infarction affecting right dominant side: Secondary | ICD-10-CM | POA: Diagnosis not present

## 2022-10-03 DIAGNOSIS — M47817 Spondylosis without myelopathy or radiculopathy, lumbosacral region: Secondary | ICD-10-CM | POA: Diagnosis not present

## 2022-10-03 DIAGNOSIS — I69392 Facial weakness following cerebral infarction: Secondary | ICD-10-CM | POA: Diagnosis not present

## 2022-10-03 DIAGNOSIS — E1142 Type 2 diabetes mellitus with diabetic polyneuropathy: Secondary | ICD-10-CM | POA: Diagnosis not present

## 2022-10-06 DIAGNOSIS — M47817 Spondylosis without myelopathy or radiculopathy, lumbosacral region: Secondary | ICD-10-CM | POA: Diagnosis not present

## 2022-10-06 DIAGNOSIS — E1142 Type 2 diabetes mellitus with diabetic polyneuropathy: Secondary | ICD-10-CM | POA: Diagnosis not present

## 2022-10-06 DIAGNOSIS — I69392 Facial weakness following cerebral infarction: Secondary | ICD-10-CM | POA: Diagnosis not present

## 2022-10-06 DIAGNOSIS — I5032 Chronic diastolic (congestive) heart failure: Secondary | ICD-10-CM | POA: Diagnosis not present

## 2022-10-06 DIAGNOSIS — I69351 Hemiplegia and hemiparesis following cerebral infarction affecting right dominant side: Secondary | ICD-10-CM | POA: Diagnosis not present

## 2022-10-06 DIAGNOSIS — I110 Hypertensive heart disease with heart failure: Secondary | ICD-10-CM | POA: Diagnosis not present

## 2022-10-09 DIAGNOSIS — I110 Hypertensive heart disease with heart failure: Secondary | ICD-10-CM | POA: Diagnosis not present

## 2022-10-09 DIAGNOSIS — I69392 Facial weakness following cerebral infarction: Secondary | ICD-10-CM | POA: Diagnosis not present

## 2022-10-09 DIAGNOSIS — I69351 Hemiplegia and hemiparesis following cerebral infarction affecting right dominant side: Secondary | ICD-10-CM | POA: Diagnosis not present

## 2022-10-09 DIAGNOSIS — E1142 Type 2 diabetes mellitus with diabetic polyneuropathy: Secondary | ICD-10-CM | POA: Diagnosis not present

## 2022-10-09 DIAGNOSIS — M47817 Spondylosis without myelopathy or radiculopathy, lumbosacral region: Secondary | ICD-10-CM | POA: Diagnosis not present

## 2022-10-09 DIAGNOSIS — I5032 Chronic diastolic (congestive) heart failure: Secondary | ICD-10-CM | POA: Diagnosis not present

## 2022-10-10 ENCOUNTER — Ambulatory Visit: Payer: TRICARE For Life (TFL) | Admitting: Cardiology

## 2022-10-10 ENCOUNTER — Ambulatory Visit: Payer: TRICARE For Life (TFL) | Admitting: Vascular Surgery

## 2022-10-10 ENCOUNTER — Telehealth: Payer: Self-pay | Admitting: Cardiology

## 2022-10-10 ENCOUNTER — Encounter (HOSPITAL_COMMUNITY): Payer: TRICARE For Life (TFL)

## 2022-10-10 NOTE — Telephone Encounter (Signed)
  Pt c/o Syncope: STAT if syncope occurred within 30 minutes and pt complains of lightheadedness High Priority if episode of passing out, completely, today or in last 24 hours   Did you pass out today? Yes, this morning  When is the last time you passed out? This morning    Has this occurred multiple times? Yes    Did you have any symptoms prior to passing out? Tachycardia     Wilkie Aye RN case manager from Dr. Hoy Finlay office calling, she said, pt been reporting blocking out episodes for the last 2 weeks, they thought its from her blood sugar, however, per home health, pt's glucose is normal every time pt is having this episode. Pt feels palpitations every time she feels like she's going to passed out, when she do some walking she can feel her heart is pounding and her body feels very heavy. Wilkie Aye scheduled the pt an appt today with Wallis Bamberg to check pt is this is heart related.

## 2022-10-11 DIAGNOSIS — M47817 Spondylosis without myelopathy or radiculopathy, lumbosacral region: Secondary | ICD-10-CM | POA: Diagnosis not present

## 2022-10-11 DIAGNOSIS — E1142 Type 2 diabetes mellitus with diabetic polyneuropathy: Secondary | ICD-10-CM | POA: Diagnosis not present

## 2022-10-11 DIAGNOSIS — I110 Hypertensive heart disease with heart failure: Secondary | ICD-10-CM | POA: Diagnosis not present

## 2022-10-11 DIAGNOSIS — I69351 Hemiplegia and hemiparesis following cerebral infarction affecting right dominant side: Secondary | ICD-10-CM | POA: Diagnosis not present

## 2022-10-11 DIAGNOSIS — I5032 Chronic diastolic (congestive) heart failure: Secondary | ICD-10-CM | POA: Diagnosis not present

## 2022-10-11 DIAGNOSIS — I69392 Facial weakness following cerebral infarction: Secondary | ICD-10-CM | POA: Diagnosis not present

## 2022-10-13 DIAGNOSIS — I110 Hypertensive heart disease with heart failure: Secondary | ICD-10-CM | POA: Diagnosis not present

## 2022-10-13 DIAGNOSIS — I69392 Facial weakness following cerebral infarction: Secondary | ICD-10-CM | POA: Diagnosis not present

## 2022-10-13 DIAGNOSIS — I69351 Hemiplegia and hemiparesis following cerebral infarction affecting right dominant side: Secondary | ICD-10-CM | POA: Diagnosis not present

## 2022-10-13 DIAGNOSIS — E1142 Type 2 diabetes mellitus with diabetic polyneuropathy: Secondary | ICD-10-CM | POA: Diagnosis not present

## 2022-10-13 DIAGNOSIS — M47817 Spondylosis without myelopathy or radiculopathy, lumbosacral region: Secondary | ICD-10-CM | POA: Diagnosis not present

## 2022-10-13 DIAGNOSIS — I5032 Chronic diastolic (congestive) heart failure: Secondary | ICD-10-CM | POA: Diagnosis not present

## 2022-10-16 DIAGNOSIS — E1142 Type 2 diabetes mellitus with diabetic polyneuropathy: Secondary | ICD-10-CM | POA: Diagnosis not present

## 2022-10-16 DIAGNOSIS — I69351 Hemiplegia and hemiparesis following cerebral infarction affecting right dominant side: Secondary | ICD-10-CM | POA: Diagnosis not present

## 2022-10-16 DIAGNOSIS — I5032 Chronic diastolic (congestive) heart failure: Secondary | ICD-10-CM | POA: Diagnosis not present

## 2022-10-16 DIAGNOSIS — I69392 Facial weakness following cerebral infarction: Secondary | ICD-10-CM | POA: Diagnosis not present

## 2022-10-16 DIAGNOSIS — I110 Hypertensive heart disease with heart failure: Secondary | ICD-10-CM | POA: Diagnosis not present

## 2022-10-16 DIAGNOSIS — M47817 Spondylosis without myelopathy or radiculopathy, lumbosacral region: Secondary | ICD-10-CM | POA: Diagnosis not present

## 2022-10-17 DIAGNOSIS — E1142 Type 2 diabetes mellitus with diabetic polyneuropathy: Secondary | ICD-10-CM | POA: Diagnosis not present

## 2022-10-17 DIAGNOSIS — I5032 Chronic diastolic (congestive) heart failure: Secondary | ICD-10-CM | POA: Diagnosis not present

## 2022-10-17 DIAGNOSIS — I69351 Hemiplegia and hemiparesis following cerebral infarction affecting right dominant side: Secondary | ICD-10-CM | POA: Diagnosis not present

## 2022-10-17 DIAGNOSIS — I69392 Facial weakness following cerebral infarction: Secondary | ICD-10-CM | POA: Diagnosis not present

## 2022-10-17 DIAGNOSIS — I110 Hypertensive heart disease with heart failure: Secondary | ICD-10-CM | POA: Diagnosis not present

## 2022-10-17 DIAGNOSIS — M47817 Spondylosis without myelopathy or radiculopathy, lumbosacral region: Secondary | ICD-10-CM | POA: Diagnosis not present

## 2022-10-19 DIAGNOSIS — I5032 Chronic diastolic (congestive) heart failure: Secondary | ICD-10-CM | POA: Diagnosis not present

## 2022-10-19 DIAGNOSIS — E1142 Type 2 diabetes mellitus with diabetic polyneuropathy: Secondary | ICD-10-CM | POA: Diagnosis not present

## 2022-10-19 DIAGNOSIS — M47817 Spondylosis without myelopathy or radiculopathy, lumbosacral region: Secondary | ICD-10-CM | POA: Diagnosis not present

## 2022-10-19 DIAGNOSIS — I69392 Facial weakness following cerebral infarction: Secondary | ICD-10-CM | POA: Diagnosis not present

## 2022-10-19 DIAGNOSIS — I110 Hypertensive heart disease with heart failure: Secondary | ICD-10-CM | POA: Diagnosis not present

## 2022-10-19 DIAGNOSIS — I69351 Hemiplegia and hemiparesis following cerebral infarction affecting right dominant side: Secondary | ICD-10-CM | POA: Diagnosis not present

## 2022-10-21 DIAGNOSIS — E1142 Type 2 diabetes mellitus with diabetic polyneuropathy: Secondary | ICD-10-CM | POA: Diagnosis not present

## 2022-10-21 DIAGNOSIS — I69392 Facial weakness following cerebral infarction: Secondary | ICD-10-CM | POA: Diagnosis not present

## 2022-10-21 DIAGNOSIS — I6521 Occlusion and stenosis of right carotid artery: Secondary | ICD-10-CM | POA: Diagnosis not present

## 2022-10-21 DIAGNOSIS — I110 Hypertensive heart disease with heart failure: Secondary | ICD-10-CM | POA: Diagnosis not present

## 2022-10-21 DIAGNOSIS — Z7902 Long term (current) use of antithrombotics/antiplatelets: Secondary | ICD-10-CM | POA: Diagnosis not present

## 2022-10-21 DIAGNOSIS — Z6836 Body mass index (BMI) 36.0-36.9, adult: Secondary | ICD-10-CM | POA: Diagnosis not present

## 2022-10-21 DIAGNOSIS — M810 Age-related osteoporosis without current pathological fracture: Secondary | ICD-10-CM | POA: Diagnosis not present

## 2022-10-21 DIAGNOSIS — F3341 Major depressive disorder, recurrent, in partial remission: Secondary | ICD-10-CM | POA: Diagnosis not present

## 2022-10-21 DIAGNOSIS — G2581 Restless legs syndrome: Secondary | ICD-10-CM | POA: Diagnosis not present

## 2022-10-21 DIAGNOSIS — E785 Hyperlipidemia, unspecified: Secondary | ICD-10-CM | POA: Diagnosis not present

## 2022-10-21 DIAGNOSIS — D519 Vitamin B12 deficiency anemia, unspecified: Secondary | ICD-10-CM | POA: Diagnosis not present

## 2022-10-21 DIAGNOSIS — Z9049 Acquired absence of other specified parts of digestive tract: Secondary | ICD-10-CM | POA: Diagnosis not present

## 2022-10-21 DIAGNOSIS — M47817 Spondylosis without myelopathy or radiculopathy, lumbosacral region: Secondary | ICD-10-CM | POA: Diagnosis not present

## 2022-10-21 DIAGNOSIS — I69351 Hemiplegia and hemiparesis following cerebral infarction affecting right dominant side: Secondary | ICD-10-CM | POA: Diagnosis not present

## 2022-10-21 DIAGNOSIS — Z7982 Long term (current) use of aspirin: Secondary | ICD-10-CM | POA: Diagnosis not present

## 2022-10-21 DIAGNOSIS — I890 Lymphedema, not elsewhere classified: Secondary | ICD-10-CM | POA: Diagnosis not present

## 2022-10-21 DIAGNOSIS — M5136 Other intervertebral disc degeneration, lumbar region: Secondary | ICD-10-CM | POA: Diagnosis not present

## 2022-10-21 DIAGNOSIS — I5032 Chronic diastolic (congestive) heart failure: Secondary | ICD-10-CM | POA: Diagnosis not present

## 2022-10-23 DIAGNOSIS — E1142 Type 2 diabetes mellitus with diabetic polyneuropathy: Secondary | ICD-10-CM | POA: Diagnosis not present

## 2022-10-23 DIAGNOSIS — I69351 Hemiplegia and hemiparesis following cerebral infarction affecting right dominant side: Secondary | ICD-10-CM | POA: Diagnosis not present

## 2022-10-23 DIAGNOSIS — I110 Hypertensive heart disease with heart failure: Secondary | ICD-10-CM | POA: Diagnosis not present

## 2022-10-23 DIAGNOSIS — I69392 Facial weakness following cerebral infarction: Secondary | ICD-10-CM | POA: Diagnosis not present

## 2022-10-23 DIAGNOSIS — I5032 Chronic diastolic (congestive) heart failure: Secondary | ICD-10-CM | POA: Diagnosis not present

## 2022-10-23 DIAGNOSIS — M47817 Spondylosis without myelopathy or radiculopathy, lumbosacral region: Secondary | ICD-10-CM | POA: Diagnosis not present

## 2022-10-23 NOTE — Progress Notes (Signed)
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 what you can do with MyChart, go to [email or web address].    Your next appointment:   6 week(s)  Provider:   Redell Leiter, MD    Other Instructions      Signed, Delon JAYSON Hoover, NP  10/24/2022 12:07 PM    [facility] "

## 2022-10-24 ENCOUNTER — Encounter: Payer: Self-pay | Admitting: Cardiology

## 2022-10-24 ENCOUNTER — Ambulatory Visit: Payer: Medicare Other | Attending: Cardiology

## 2022-10-24 ENCOUNTER — Ambulatory Visit: Payer: Medicare Other | Attending: Cardiology | Admitting: Cardiology

## 2022-10-24 VITALS — BP 120/82 | HR 94 | Ht 63.0 in | Wt 162.0 lb

## 2022-10-24 DIAGNOSIS — J449 Chronic obstructive pulmonary disease, unspecified: Secondary | ICD-10-CM

## 2022-10-24 DIAGNOSIS — I6521 Occlusion and stenosis of right carotid artery: Secondary | ICD-10-CM

## 2022-10-24 DIAGNOSIS — R55 Syncope and collapse: Secondary | ICD-10-CM | POA: Diagnosis present

## 2022-10-24 DIAGNOSIS — I69392 Facial weakness following cerebral infarction: Secondary | ICD-10-CM | POA: Diagnosis not present

## 2022-10-24 DIAGNOSIS — E1142 Type 2 diabetes mellitus with diabetic polyneuropathy: Secondary | ICD-10-CM | POA: Diagnosis not present

## 2022-10-24 DIAGNOSIS — I69351 Hemiplegia and hemiparesis following cerebral infarction affecting right dominant side: Secondary | ICD-10-CM | POA: Diagnosis not present

## 2022-10-24 DIAGNOSIS — E782 Mixed hyperlipidemia: Secondary | ICD-10-CM

## 2022-10-24 DIAGNOSIS — I5032 Chronic diastolic (congestive) heart failure: Secondary | ICD-10-CM

## 2022-10-24 DIAGNOSIS — I25118 Atherosclerotic heart disease of native coronary artery with other forms of angina pectoris: Secondary | ICD-10-CM

## 2022-10-24 DIAGNOSIS — M47817 Spondylosis without myelopathy or radiculopathy, lumbosacral region: Secondary | ICD-10-CM | POA: Diagnosis not present

## 2022-10-24 DIAGNOSIS — R011 Cardiac murmur, unspecified: Secondary | ICD-10-CM

## 2022-10-24 DIAGNOSIS — I110 Hypertensive heart disease with heart failure: Secondary | ICD-10-CM | POA: Diagnosis not present

## 2022-10-24 NOTE — Patient Instructions (Addendum)
Medication Instructions:  Your physician recommends that you continue on your current medications as directed. Please refer to the Current Medication list given to you today.  *If you need a refill on your cardiac medications before your next appointment, please call your pharmacy*   Lab Work: Your physician recommends that you return for lab work in: Today for a BMP and CBC  If you have labs (blood work) drawn today and your tests are completely normal, you will receive your results only by: MyChart Message (if you have MyChart) OR A paper copy in the mail If you have any lab test that is abnormal or we need to change your treatment, we will call you to review the results.   Testing/Procedures: You have been asked to wear a Zio Heart Monitor today. It is to be worn for 14 days. Please remove the monitor on July 2nd  and mail back in the box provided.  If you have any questions about the monitor please call the company at [phone]   Your physician has requested that you have an echocardiogram. Echocardiography is a painless test that uses sound waves to create images of your heart. It provides your doctor with information about the size and shape of your heart and how well your heart's chambers and valves are working. This procedure takes approximately one hour. There are no restrictions for this procedure. Please do NOT wear cologne, perfume, aftershave, or lotions (deodorant is allowed). Please arrive 15 minutes prior to your appointment time.     Follow-Up: At [facility], you and your health needs are our priority.  As part of our continuing mission to provide you with exceptional heart care, we have created designated Provider Care Teams.  These Care Teams include your primary Cardiologist (physician) and Advanced Practice Providers (APPs -  Physician Assistants and Nurse Practitioners) who all work together to provide you with the care you need, when you need it.  We  recommend signing up for the patient portal called "MyChart".  Sign up information is provided on this After Visit Summary.  MyChart is used to connect with patients for Virtual Visits (Telemedicine).  Patients are able to view lab/test results, encounter notes, upcoming appointments, etc.  Non-urgent messages can be sent to your provider as well.   To learn more about what you can do with MyChart, go to [email or web address].    Your next appointment:   6 week(s)  Provider:   Norman Herrlich, MD    Other Instructions

## 2022-10-25 DIAGNOSIS — I110 Hypertensive heart disease with heart failure: Secondary | ICD-10-CM | POA: Diagnosis not present

## 2022-10-25 DIAGNOSIS — I69392 Facial weakness following cerebral infarction: Secondary | ICD-10-CM | POA: Diagnosis not present

## 2022-10-25 DIAGNOSIS — I5032 Chronic diastolic (congestive) heart failure: Secondary | ICD-10-CM | POA: Diagnosis not present

## 2022-10-25 DIAGNOSIS — E1142 Type 2 diabetes mellitus with diabetic polyneuropathy: Secondary | ICD-10-CM | POA: Diagnosis not present

## 2022-10-25 DIAGNOSIS — M47817 Spondylosis without myelopathy or radiculopathy, lumbosacral region: Secondary | ICD-10-CM | POA: Diagnosis not present

## 2022-10-25 DIAGNOSIS — I69351 Hemiplegia and hemiparesis following cerebral infarction affecting right dominant side: Secondary | ICD-10-CM | POA: Diagnosis not present

## 2022-10-25 LAB — CBC WITH DIFFERENTIAL/PLATELET
Basophils Absolute: 0.1 x10E3/uL (ref 0.0–0.2)
Basos: 1 %
EOS (ABSOLUTE): 0.3 x10E3/uL (ref 0.0–0.4)
Eos: 3 %
Hematocrit: 41.6 % (ref 34.0–46.6)
Hemoglobin: 13 g/dL (ref 11.1–15.9)
Immature Grans (Abs): 0 x10E3/uL (ref 0.0–0.1)
Immature Granulocytes: 0 %
Lymphocytes Absolute: 2.4 x10E3/uL (ref 0.7–3.1)
Lymphs: 24 %
MCH: 26.6 pg (ref 26.6–33.0)
MCHC: 31.3 g/dL — ABNORMAL LOW (ref 31.5–35.7)
MCV: 85 fL (ref 79–97)
Monocytes Absolute: 0.7 x10E3/uL (ref 0.1–0.9)
Monocytes: 7 %
Neutrophils Absolute: 6.5 x10E3/uL (ref 1.4–7.0)
Neutrophils: 65 %
Platelets: 303 x10E3/uL (ref 150–450)
RBC: 4.88 x10E6/uL (ref 3.77–5.28)
RDW: 15.1 % (ref 11.7–15.4)
WBC: 9.9 x10E3/uL (ref 3.4–10.8)

## 2022-10-25 LAB — BASIC METABOLIC PANEL WITH GFR
BUN/Creatinine Ratio: 9 — ABNORMAL LOW (ref 12–28)
BUN: 9 mg/dL (ref 8–27)
CO2: 26 mmol/L (ref 20–29)
Calcium: 9.2 mg/dL (ref 8.7–10.3)
Chloride: 107 mmol/L — ABNORMAL HIGH (ref 96–106)
Creatinine, Ser: 0.98 mg/dL (ref 0.57–1.00)
Glucose: 141 mg/dL — ABNORMAL HIGH (ref 70–99)
Potassium: 4.3 mmol/L (ref 3.5–5.2)
Sodium: 147 mmol/L — ABNORMAL HIGH (ref 134–144)
eGFR: 60 mL/min/1.73

## 2022-10-26 DIAGNOSIS — R55 Syncope and collapse: Secondary | ICD-10-CM | POA: Diagnosis not present

## 2022-10-31 DIAGNOSIS — I69351 Hemiplegia and hemiparesis following cerebral infarction affecting right dominant side: Secondary | ICD-10-CM | POA: Diagnosis not present

## 2022-10-31 DIAGNOSIS — I69392 Facial weakness following cerebral infarction: Secondary | ICD-10-CM | POA: Diagnosis not present

## 2022-10-31 DIAGNOSIS — M47817 Spondylosis without myelopathy or radiculopathy, lumbosacral region: Secondary | ICD-10-CM | POA: Diagnosis not present

## 2022-10-31 DIAGNOSIS — I5032 Chronic diastolic (congestive) heart failure: Secondary | ICD-10-CM | POA: Diagnosis not present

## 2022-10-31 DIAGNOSIS — E1142 Type 2 diabetes mellitus with diabetic polyneuropathy: Secondary | ICD-10-CM | POA: Diagnosis not present

## 2022-10-31 DIAGNOSIS — I110 Hypertensive heart disease with heart failure: Secondary | ICD-10-CM | POA: Diagnosis not present

## 2022-11-03 DIAGNOSIS — E1142 Type 2 diabetes mellitus with diabetic polyneuropathy: Secondary | ICD-10-CM | POA: Diagnosis not present

## 2022-11-03 DIAGNOSIS — I5032 Chronic diastolic (congestive) heart failure: Secondary | ICD-10-CM | POA: Diagnosis not present

## 2022-11-03 DIAGNOSIS — I110 Hypertensive heart disease with heart failure: Secondary | ICD-10-CM | POA: Diagnosis not present

## 2022-11-03 DIAGNOSIS — I69351 Hemiplegia and hemiparesis following cerebral infarction affecting right dominant side: Secondary | ICD-10-CM | POA: Diagnosis not present

## 2022-11-03 DIAGNOSIS — I69392 Facial weakness following cerebral infarction: Secondary | ICD-10-CM | POA: Diagnosis not present

## 2022-11-03 DIAGNOSIS — M47817 Spondylosis without myelopathy or radiculopathy, lumbosacral region: Secondary | ICD-10-CM | POA: Diagnosis not present

## 2022-11-06 DIAGNOSIS — E1142 Type 2 diabetes mellitus with diabetic polyneuropathy: Secondary | ICD-10-CM | POA: Diagnosis not present

## 2022-11-06 DIAGNOSIS — M47817 Spondylosis without myelopathy or radiculopathy, lumbosacral region: Secondary | ICD-10-CM | POA: Diagnosis not present

## 2022-11-06 DIAGNOSIS — I5032 Chronic diastolic (congestive) heart failure: Secondary | ICD-10-CM | POA: Diagnosis not present

## 2022-11-06 DIAGNOSIS — I69351 Hemiplegia and hemiparesis following cerebral infarction affecting right dominant side: Secondary | ICD-10-CM | POA: Diagnosis not present

## 2022-11-06 DIAGNOSIS — I110 Hypertensive heart disease with heart failure: Secondary | ICD-10-CM | POA: Diagnosis not present

## 2022-11-06 DIAGNOSIS — I69392 Facial weakness following cerebral infarction: Secondary | ICD-10-CM | POA: Diagnosis not present

## 2022-11-07 ENCOUNTER — Emergency Department (HOSPITAL_COMMUNITY): Payer: Medicare Other

## 2022-11-07 ENCOUNTER — Inpatient Hospital Stay (HOSPITAL_COMMUNITY)
Admission: EM | Admit: 2022-11-07 | Discharge: 2022-11-10 | DRG: 261 | Disposition: A | Discharge status: Skilled Nursing Facility | Payer: Medicare Other | Attending: Internal Medicine | Admitting: Internal Medicine

## 2022-11-07 ENCOUNTER — Encounter (HOSPITAL_COMMUNITY): Payer: Self-pay

## 2022-11-07 ENCOUNTER — Other Ambulatory Visit: Payer: Self-pay

## 2022-11-07 DIAGNOSIS — E11649 Type 2 diabetes mellitus with hypoglycemia without coma: Secondary | ICD-10-CM | POA: Diagnosis not present

## 2022-11-07 DIAGNOSIS — Z8249 Family history of ischemic heart disease and other diseases of the circulatory system: Secondary | ICD-10-CM | POA: Diagnosis not present

## 2022-11-07 DIAGNOSIS — J42 Unspecified chronic bronchitis: Secondary | ICD-10-CM | POA: Diagnosis not present

## 2022-11-07 DIAGNOSIS — M6281 Muscle weakness (generalized): Secondary | ICD-10-CM | POA: Diagnosis not present

## 2022-11-07 DIAGNOSIS — Z811 Family history of alcohol abuse and dependence: Secondary | ICD-10-CM | POA: Diagnosis not present

## 2022-11-07 DIAGNOSIS — E785 Hyperlipidemia, unspecified: Secondary | ICD-10-CM | POA: Diagnosis not present

## 2022-11-07 DIAGNOSIS — R41841 Cognitive communication deficit: Secondary | ICD-10-CM | POA: Diagnosis not present

## 2022-11-07 DIAGNOSIS — I639 Cerebral infarction, unspecified: Secondary | ICD-10-CM | POA: Diagnosis not present

## 2022-11-07 DIAGNOSIS — Z7902 Long term (current) use of antithrombotics/antiplatelets: Secondary | ICD-10-CM

## 2022-11-07 DIAGNOSIS — Z8042 Family history of malignant neoplasm of prostate: Secondary | ICD-10-CM

## 2022-11-07 DIAGNOSIS — R918 Other nonspecific abnormal finding of lung field: Secondary | ICD-10-CM | POA: Diagnosis not present

## 2022-11-07 DIAGNOSIS — Z7401 Bed confinement status: Secondary | ICD-10-CM | POA: Diagnosis not present

## 2022-11-07 DIAGNOSIS — W1830XA Fall on same level, unspecified, initial encounter: Secondary | ICD-10-CM | POA: Diagnosis present

## 2022-11-07 DIAGNOSIS — R9082 White matter disease, unspecified: Secondary | ICD-10-CM | POA: Diagnosis not present

## 2022-11-07 DIAGNOSIS — I679 Cerebrovascular disease, unspecified: Secondary | ICD-10-CM

## 2022-11-07 DIAGNOSIS — Z8673 Personal history of transient ischemic attack (TIA), and cerebral infarction without residual deficits: Secondary | ICD-10-CM | POA: Diagnosis not present

## 2022-11-07 DIAGNOSIS — R519 Headache, unspecified: Secondary | ICD-10-CM | POA: Diagnosis not present

## 2022-11-07 DIAGNOSIS — G4489 Other headache syndrome: Secondary | ICD-10-CM | POA: Diagnosis not present

## 2022-11-07 DIAGNOSIS — Z9049 Acquired absence of other specified parts of digestive tract: Secondary | ICD-10-CM | POA: Diagnosis not present

## 2022-11-07 DIAGNOSIS — E1165 Type 2 diabetes mellitus with hyperglycemia: Secondary | ICD-10-CM | POA: Diagnosis not present

## 2022-11-07 DIAGNOSIS — R55 Syncope and collapse: Secondary | ICD-10-CM

## 2022-11-07 DIAGNOSIS — Z7984 Long term (current) use of oral hypoglycemic drugs: Secondary | ICD-10-CM | POA: Diagnosis not present

## 2022-11-07 DIAGNOSIS — M4696 Unspecified inflammatory spondylopathy, lumbar region: Secondary | ICD-10-CM | POA: Diagnosis not present

## 2022-11-07 DIAGNOSIS — I2510 Atherosclerotic heart disease of native coronary artery without angina pectoris: Secondary | ICD-10-CM | POA: Diagnosis present

## 2022-11-07 DIAGNOSIS — I6782 Cerebral ischemia: Secondary | ICD-10-CM | POA: Diagnosis not present

## 2022-11-07 DIAGNOSIS — I110 Hypertensive heart disease with heart failure: Secondary | ICD-10-CM | POA: Diagnosis present

## 2022-11-07 DIAGNOSIS — Z741 Need for assistance with personal care: Secondary | ICD-10-CM | POA: Diagnosis not present

## 2022-11-07 DIAGNOSIS — Z66 Do not resuscitate: Secondary | ICD-10-CM | POA: Diagnosis not present

## 2022-11-07 DIAGNOSIS — S0990XA Unspecified injury of head, initial encounter: Secondary | ICD-10-CM | POA: Diagnosis not present

## 2022-11-07 DIAGNOSIS — E1142 Type 2 diabetes mellitus with diabetic polyneuropathy: Secondary | ICD-10-CM | POA: Diagnosis not present

## 2022-11-07 DIAGNOSIS — Z888 Allergy status to other drugs, medicaments and biological substances status: Secondary | ICD-10-CM

## 2022-11-07 DIAGNOSIS — Z5329 Procedure and treatment not carried out because of patient's decision for other reasons: Secondary | ICD-10-CM | POA: Diagnosis present

## 2022-11-07 DIAGNOSIS — I6501 Occlusion and stenosis of right vertebral artery: Secondary | ICD-10-CM | POA: Diagnosis not present

## 2022-11-07 DIAGNOSIS — Z823 Family history of stroke: Secondary | ICD-10-CM

## 2022-11-07 DIAGNOSIS — I499 Cardiac arrhythmia, unspecified: Secondary | ICD-10-CM | POA: Diagnosis not present

## 2022-11-07 DIAGNOSIS — R29810 Facial weakness: Secondary | ICD-10-CM | POA: Diagnosis not present

## 2022-11-07 DIAGNOSIS — I6521 Occlusion and stenosis of right carotid artery: Secondary | ICD-10-CM

## 2022-11-07 DIAGNOSIS — I672 Cerebral atherosclerosis: Secondary | ICD-10-CM | POA: Diagnosis not present

## 2022-11-07 DIAGNOSIS — I959 Hypotension, unspecified: Principal | ICD-10-CM | POA: Diagnosis present

## 2022-11-07 DIAGNOSIS — Z630 Problems in relationship with spouse or partner: Secondary | ICD-10-CM

## 2022-11-07 DIAGNOSIS — S2249XD Multiple fractures of ribs, unspecified side, subsequent encounter for fracture with routine healing: Secondary | ICD-10-CM | POA: Diagnosis not present

## 2022-11-07 DIAGNOSIS — M810 Age-related osteoporosis without current pathological fracture: Secondary | ICD-10-CM | POA: Diagnosis present

## 2022-11-07 DIAGNOSIS — Y92012 Bathroom of single-family (private) house as the place of occurrence of the external cause: Secondary | ICD-10-CM | POA: Diagnosis not present

## 2022-11-07 DIAGNOSIS — Z833 Family history of diabetes mellitus: Secondary | ICD-10-CM | POA: Diagnosis not present

## 2022-11-07 DIAGNOSIS — I10 Essential (primary) hypertension: Secondary | ICD-10-CM

## 2022-11-07 DIAGNOSIS — I6523 Occlusion and stenosis of bilateral carotid arteries: Secondary | ICD-10-CM | POA: Diagnosis not present

## 2022-11-07 DIAGNOSIS — Z794 Long term (current) use of insulin: Secondary | ICD-10-CM | POA: Diagnosis not present

## 2022-11-07 DIAGNOSIS — Z90710 Acquired absence of both cervix and uterus: Secondary | ICD-10-CM | POA: Diagnosis not present

## 2022-11-07 DIAGNOSIS — E782 Mixed hyperlipidemia: Secondary | ICD-10-CM | POA: Diagnosis not present

## 2022-11-07 DIAGNOSIS — W19XXXA Unspecified fall, initial encounter: Secondary | ICD-10-CM | POA: Diagnosis not present

## 2022-11-07 DIAGNOSIS — S2241XA Multiple fractures of ribs, right side, initial encounter for closed fracture: Secondary | ICD-10-CM

## 2022-11-07 DIAGNOSIS — I5032 Chronic diastolic (congestive) heart failure: Secondary | ICD-10-CM | POA: Diagnosis not present

## 2022-11-07 DIAGNOSIS — R569 Unspecified convulsions: Secondary | ICD-10-CM | POA: Diagnosis not present

## 2022-11-07 DIAGNOSIS — Z79899 Other long term (current) drug therapy: Secondary | ICD-10-CM | POA: Diagnosis not present

## 2022-11-07 DIAGNOSIS — R531 Weakness: Secondary | ICD-10-CM | POA: Diagnosis not present

## 2022-11-07 DIAGNOSIS — R296 Repeated falls: Secondary | ICD-10-CM | POA: Diagnosis not present

## 2022-11-07 DIAGNOSIS — E119 Type 2 diabetes mellitus without complications: Secondary | ICD-10-CM | POA: Diagnosis not present

## 2022-11-07 DIAGNOSIS — S2249XA Multiple fractures of ribs, unspecified side, initial encounter for closed fracture: Secondary | ICD-10-CM

## 2022-11-07 DIAGNOSIS — R2681 Unsteadiness on feet: Secondary | ICD-10-CM | POA: Diagnosis not present

## 2022-11-07 DIAGNOSIS — J449 Chronic obstructive pulmonary disease, unspecified: Secondary | ICD-10-CM

## 2022-11-07 DIAGNOSIS — R0781 Pleurodynia: Secondary | ICD-10-CM | POA: Diagnosis not present

## 2022-11-07 LAB — PROTIME-INR
INR: 1.1 (ref 0.8–1.2)
Prothrombin Time: 14.1 seconds (ref 11.4–15.2)

## 2022-11-07 LAB — URINALYSIS, ROUTINE W REFLEX MICROSCOPIC
Bilirubin Urine: NEGATIVE
Glucose, UA: >=500 mg/dL — AB
Ketones, ur: NEGATIVE mg/dL
Nitrite: POSITIVE — AB
Protein, ur: NEGATIVE mg/dL
Specific Gravity, Urine: 1.015 (ref 1.005–1.030)
pH: 7 (ref 5.0–8.0)

## 2022-11-07 LAB — COMPREHENSIVE METABOLIC PANEL
ALT: 15 U/L (ref 0–44)
AST: 23 U/L (ref 15–41)
Albumin: 3 g/dL — ABNORMAL LOW (ref 3.5–5.0)
Alkaline Phosphatase: 71 U/L (ref 38–126)
Anion gap: 9 (ref 5–15)
BUN: 10 mg/dL (ref 8–23)
CO2: 26 mmol/L (ref 22–32)
Calcium: 8.5 mg/dL — ABNORMAL LOW (ref 8.9–10.3)
Chloride: 105 mmol/L (ref 98–111)
Creatinine, Ser: 1.02 mg/dL — ABNORMAL HIGH (ref 0.44–1.00)
GFR, Estimated: 57 mL/min — ABNORMAL LOW (ref >60)
Glucose, Bld: 66 mg/dL — ABNORMAL LOW (ref 70–99)
Potassium: 3.4 mmol/L — ABNORMAL LOW (ref 3.5–5.1)
Sodium: 140 mmol/L (ref 135–145)
Total Bilirubin: 0.3 mg/dL (ref 0.3–1.2)
Total Protein: 6.5 g/dL (ref 6.5–8.1)

## 2022-11-07 LAB — DIFFERENTIAL
Abs Immature Granulocytes: 0.11 10*3/uL — ABNORMAL HIGH (ref 0.00–0.07)
Basophils Absolute: 0.1 10*3/uL (ref 0.0–0.1)
Basophils Relative: 1 %
Eosinophils Absolute: 0.1 10*3/uL (ref 0.0–0.5)
Eosinophils Relative: 1 %
Immature Granulocytes: 1 %
Lymphocytes Relative: 18 %
Lymphs Abs: 2.3 10*3/uL (ref 0.7–4.0)
Monocytes Absolute: 1.1 10*3/uL — ABNORMAL HIGH (ref 0.1–1.0)
Monocytes Relative: 9 %
Neutro Abs: 9.1 10*3/uL — ABNORMAL HIGH (ref 1.7–7.7)
Neutrophils Relative %: 70 %

## 2022-11-07 LAB — CBC
HCT: 41.2 % (ref 36.0–46.0)
Hemoglobin: 12.6 g/dL (ref 12.0–15.0)
MCH: 26.7 pg (ref 26.0–34.0)
MCHC: 30.6 g/dL (ref 30.0–36.0)
MCV: 87.3 fL (ref 80.0–100.0)
Platelets: 293 10*3/uL (ref 150–400)
RBC: 4.72 MIL/uL (ref 3.87–5.11)
RDW: 15.2 % (ref 11.5–15.5)
WBC: 12.8 10*3/uL — ABNORMAL HIGH (ref 4.0–10.5)
nRBC: 0 % (ref 0.0–0.2)

## 2022-11-07 LAB — ETHANOL: Alcohol, Ethyl (B): <10 mg/dL (ref <10)

## 2022-11-07 LAB — GLUCOSE, CAPILLARY
Glucose-Capillary: 88 mg/dL (ref 70–99)
Glucose-Capillary: 143 mg/dL — ABNORMAL HIGH (ref 70–99)

## 2022-11-07 LAB — CBG MONITORING, ED: Glucose-Capillary: 88 mg/dL (ref 70–99)

## 2022-11-07 LAB — APTT: aPTT: 27 seconds (ref 24–36)

## 2022-11-07 MED ORDER — EZETIMIBE 10 MG PO TABS
10.0000 mg | ORAL_TABLET | Freq: Every morning | ORAL | Status: DC
  Administered 2022-11-08 – 2022-11-10 (×3): 10 mg via ORAL
  Filled 2022-11-07 (×3): qty 1

## 2022-11-07 MED ORDER — ACETAMINOPHEN 500 MG PO TABS
1000.0000 mg | ORAL_TABLET | Freq: Four times a day (QID) | ORAL | Status: DC
  Administered 2022-11-07: 1000 mg via ORAL
  Filled 2022-11-07: qty 2

## 2022-11-07 MED ORDER — ENOXAPARIN SODIUM 40 MG/0.4ML IJ SOSY
40.0000 mg | PREFILLED_SYRINGE | INTRAMUSCULAR | Status: DC
  Administered 2022-11-07 – 2022-11-09 (×3): 40 mg via SUBCUTANEOUS
  Filled 2022-11-07 (×3): qty 0.4

## 2022-11-07 MED ORDER — GABAPENTIN 300 MG PO CAPS
300.0000 mg | ORAL_CAPSULE | Freq: Two times a day (BID) | ORAL | Status: DC
  Administered 2022-11-07 – 2022-11-10 (×6): 300 mg via ORAL
  Filled 2022-11-07 (×6): qty 1

## 2022-11-07 MED ORDER — ATORVASTATIN CALCIUM 80 MG PO TABS
80.0000 mg | ORAL_TABLET | Freq: Every day | ORAL | Status: DC
  Administered 2022-11-07 – 2022-11-09 (×3): 80 mg via ORAL
  Filled 2022-11-07: qty 2
  Filled 2022-11-07 (×2): qty 1

## 2022-11-07 MED ORDER — SODIUM CHLORIDE 0.9% FLUSH
3.0000 mL | Freq: Once | INTRAVENOUS | Status: AC
  Administered 2022-11-07: 3 mL via INTRAVENOUS

## 2022-11-07 MED ORDER — CLOPIDOGREL BISULFATE 75 MG PO TABS
75.0000 mg | ORAL_TABLET | Freq: Every morning | ORAL | Status: DC
  Administered 2022-11-08 – 2022-11-10 (×3): 75 mg via ORAL
  Filled 2022-11-07 (×3): qty 1

## 2022-11-07 MED ORDER — INSULIN ASPART 100 UNIT/ML IJ SOLN: 0.0000 [IU] | Freq: Three times a day (TID) | INTRAMUSCULAR | Status: DC

## 2022-11-07 MED ORDER — ONDANSETRON HCL 4 MG/2ML IJ SOLN
4.0000 mg | Freq: Four times a day (QID) | INTRAMUSCULAR | Status: DC | PRN
  Administered 2022-11-08: 4 mg via INTRAVENOUS
  Filled 2022-11-07: qty 2

## 2022-11-07 MED ORDER — ONDANSETRON HCL 4 MG PO TABS: 4.0000 mg | ORAL_TABLET | Freq: Four times a day (QID) | ORAL | Status: DC | PRN

## 2022-11-07 MED ORDER — ROPINIROLE HCL 1 MG PO TABS
1.0000 mg | ORAL_TABLET | Freq: Every day | ORAL | Status: DC
  Administered 2022-11-07 – 2022-11-09 (×3): 1 mg via ORAL
  Filled 2022-11-07 (×3): qty 1

## 2022-11-07 MED ORDER — DAPAGLIFLOZIN PROPANEDIOL 10 MG PO TABS
10.0000 mg | ORAL_TABLET | Freq: Every day | ORAL | Status: DC
  Administered 2022-11-08: 10 mg via ORAL
  Filled 2022-11-07: qty 1

## 2022-11-07 MED ORDER — OXYCODONE HCL 5 MG PO TABS
5.0000 mg | ORAL_TABLET | ORAL | Status: DC | PRN
  Administered 2022-11-07: 5 mg via ORAL
  Filled 2022-11-07: qty 1

## 2022-11-07 MED ORDER — LORAZEPAM 2 MG/ML IJ SOLN
1.0000 mg | Freq: Once | INTRAMUSCULAR | Status: AC
  Administered 2022-11-07: 1 mg via INTRAVENOUS
  Filled 2022-11-07: qty 1

## 2022-11-07 MED ORDER — IRBESARTAN 300 MG PO TABS
300.0000 mg | ORAL_TABLET | Freq: Every day | ORAL | Status: DC
  Administered 2022-11-08: 300 mg via ORAL
  Filled 2022-11-07: qty 1

## 2022-11-07 MED ORDER — INSULIN ASPART 100 UNIT/ML IJ SOLN: 0.0000 [IU] | Freq: Every day | INTRAMUSCULAR | Status: DC

## 2022-11-07 MED ORDER — IOHEXOL 350 MG/ML SOLN
75.0000 mL | Freq: Once | INTRAVENOUS | Status: AC | PRN
  Administered 2022-11-07: 75 mL via INTRAVENOUS

## 2022-11-07 MED ORDER — INSULIN GLARGINE-YFGN 100 UNIT/ML ~~LOC~~ SOLN
30.0000 [IU] | Freq: Two times a day (BID) | SUBCUTANEOUS | Status: DC
  Administered 2022-11-07 – 2022-11-08 (×2): 30 [IU] via SUBCUTANEOUS
  Filled 2022-11-07 (×3): qty 0.3

## 2022-11-07 MED ORDER — FESOTERODINE FUMARATE ER 4 MG PO TB24
4.0000 mg | ORAL_TABLET | Freq: Every day | ORAL | Status: DC
  Administered 2022-11-07 – 2022-11-09 (×3): 4 mg via ORAL
  Filled 2022-11-07 (×4): qty 1

## 2022-11-07 MED ORDER — HYDRALAZINE HCL 25 MG PO TABS
25.0000 mg | ORAL_TABLET | Freq: Three times a day (TID) | ORAL | Status: DC
  Administered 2022-11-07 – 2022-11-08 (×3): 25 mg via ORAL
  Filled 2022-11-07 (×3): qty 1

## 2022-11-07 MED ORDER — ACETAMINOPHEN 500 MG PO TABS
1000.0000 mg | ORAL_TABLET | Freq: Three times a day (TID) | ORAL | Status: DC
  Administered 2022-11-07 – 2022-11-10 (×8): 1000 mg via ORAL
  Filled 2022-11-07 (×8): qty 2

## 2022-11-07 NOTE — ED Provider Notes (Signed)
[place] EMERGENCY DEPARTMENT AT [facility] Provider Note   CSN: [id number] Arrival date & time: 11/07/22  1018     History  Chief Complaint  Patient presents with   [name] is a 77 y.o. female.  HPI  Patient is a 77 year old female with a past medical history significant for HTN, HLD, strokes, DM2, CHF, hypertensive heart disease, COPD  Patient presents emergency room today after a syncopal episode at home she fell in bathroom and struck her head on the ground.  She was brought today to the emergency room as a code stroke because of some right gaze preference and questionable weakness.  Seems that on arrival to the emergency department she was diffusely weak and had no gaze deviation.  Code stroke was canceled.  Patient did undergo MRI however.  She states that she has frequent episodes of syncope.  It seems that she has had hypoglycemic episodes as well      Home Medications Prior to Admission medications   Medication Sig Start Date End Date Taking? Authorizing Provider  atorvastatin (LIPITOR) 80 MG tablet Take 80 mg by mouth at bedtime.     [provider]  clopidogrel (PLAVIX) 75 MG tablet Take 75 mg by mouth every morning.     [provider]  ezetimibe (ZETIA) 10 MG tablet Take 10 mg by mouth every morning.     [provider]  FARXIGA 10 MG TABS tablet Take 10 mg by mouth daily. 07/04/21   [provider]  gabapentin (NEURONTIN) 300 MG capsule Take 300 mg by mouth in the morning and at bedtime.    [provider]  glimepiride (AMARYL) 4 MG tablet Take 1 tablet (4 mg total) by mouth 2 (two) times daily with a meal. 11/18/18   Layne Benton, NP  HUMALOG 100 UNIT/ML injection Inject 30 Units into the skin 3 (three) times daily with meals. 08/31/21   [provider]  hydrALAZINE (APRESOLINE) 25 MG tablet Take 25 mg by mouth in the morning, at noon, and at bedtime. 12/23/19   [provider]   insulin glargine (LANTUS) 100 UNIT/ML injection Inject 40 Units into the skin 2 (two) times daily.    [provider]  isosorbide mononitrate (IMDUR) 30 MG 24 hr tablet Take 1 tablet (30 mg total) by mouth daily. 11/16/20 07/21/22  Baldo Daub, MD  metolazone (ZAROXOLYN) 2.5 MG tablet Take 2.5 mg by mouth once a week. Take a 2nd Metolazone if needed for fluid and BP    [provider]  nitroGLYCERIN (NITROSTAT) 0.4 MG SL tablet Place 1 tablet (0.4 mg total) under the tongue every 5 (five) minutes as needed for chest pain. 02/10/21   Baldo Daub, MD  potassium chloride SA (KLOR-CON M) 20 MEQ tablet Take 20 mEq by mouth daily. 04/10/22   [provider]  rOPINIRole (REQUIP) 1 MG tablet Take 1 mg by mouth at bedtime.     [provider]  torsemide (DEMADEX) 100 MG tablet Take 100 mg by mouth 2 (two) times daily.    [provider]  valsartan (DIOVAN) 320 MG tablet Take 320 mg by mouth daily. 12/23/19   [provider]      Allergies    Alendronate and Valsartan    Review of Systems   Review of Systems  Physical Exam Updated Vital Signs BP (!) 172/80   Pulse 71   Temp 98.9 F (37.2 C) (Oral)  Resp 18   SpO2 98%  Physical Exam Vitals and nursing note reviewed.  Constitutional:      General: She is not in acute distress. HENT:     Head: Normocephalic and atraumatic.     Nose: Nose normal.     Mouth/Throat:     Mouth: Mucous membranes are moist.  Eyes:     General: No scleral icterus. Cardiovascular:     Rate and Rhythm: Normal rate and regular rhythm.     Pulses: Normal pulses.     Heart sounds: Normal heart sounds.  Pulmonary:     Effort: Pulmonary effort is normal. No respiratory distress.     Breath sounds: No wheezing.  Abdominal:     Palpations: Abdomen is soft.     Tenderness: There is no abdominal tenderness. There is no guarding or rebound.  Musculoskeletal:     Cervical back: Normal range of motion.      Right lower leg: No edema.     Left lower leg: No edema.     Comments: Exquisite right lateral thoracic chest wall tenderness  Skin:    General: Skin is warm and dry.     Capillary Refill: Capillary refill takes less than 2 seconds.     Comments: Very irritated skin in the buttocks No cellulitic changes  Neurological:     Mental Status: She is alert. Mental status is at baseline.  Psychiatric:        Mood and Affect: Mood normal.        Behavior: Behavior normal.     ED Results / Procedures / Treatments   Labs (all labs ordered are listed, but only abnormal results are displayed) Labs Reviewed  CBC - Abnormal; Notable for the following components:      Result Value   WBC 12.8 (*)    All other components within normal limits  DIFFERENTIAL - Abnormal; Notable for the following components:   Neutro Abs 9.1 (*)    Monocytes Absolute 1.1 (*)    Abs Immature Granulocytes 0.11 (*)    All other components within normal limits  COMPREHENSIVE METABOLIC PANEL - Abnormal; Notable for the following components:   Potassium 3.4 (*)    Glucose, Bld 66 (*)    Creatinine, Ser 1.02 (*)    Calcium 8.5 (*)    Albumin 3.0 (*)    GFR, Estimated 57 (*)    All other components within normal limits  PROTIME-INR  APTT  ETHANOL  URINALYSIS, ROUTINE W REFLEX MICROSCOPIC  CBG MONITORING, ED    EKG EKG Interpretation Date/Time:  Tuesday November 07 2022 10:50:06 EDT Ventricular Rate:  77 PR Interval:  161 QRS Duration:  116 QT Interval:  448 QTC Calculation: 508 R Axis:   175  Text Interpretation: Sinus rhythm Confirmed by Kristine Royal [phone]) on 11/07/2022 11:00:47 AM  Radiology DG Ribs Unilateral W/Chest Right  Result Date: 11/07/2022 CLINICAL DATA:  Status post fall with right lateral/anterior rib pain EXAM: RIGHT RIBS AND CHEST - 3 VIEW COMPARISON:  Chest radiograph dated 03/26/2022 FINDINGS: Normal lung volumes. No focal consolidations. No pneumothorax. Focal pleural thickening along the  lower right lateral lung adjacent to the rib fractures. The heart size and mediastinal contours are within normal limits. Displaced fractures of the right lateral seventh through ninth ribs. IMPRESSION: 1. Displaced fractures of the right lateral seventh through ninth ribs. 2. Focal pleural thickening along the lower right lateral lung adjacent to the rib fractures, likely a small pleural effusion. No  pneumothorax. Electronically Signed   By: Agustin Cree M.D.   On: 11/07/2022 14:02   MR BRAIN WO CONTRAST  Result Date: 11/07/2022 CLINICAL DATA:  Provided history: Syncope/presyncope, cerebrovascular cause suspected. Right-sided weakness/pain. EXAM: MRI HEAD WITHOUT CONTRAST TECHNIQUE: Multiplanar, multiecho pulse sequences of the brain and surrounding structures were obtained without intravenous contrast. COMPARISON:  Non-contrast head CT and CT angiogram head/neck performed earlier today 11/07/2022. Brain MRI 05/18/2020. FINDINGS: Intermittently motion degraded examination, limiting evaluation. Most notably, there is moderate motion degradation of the axial T2 FLAIR sequence and moderate motion degradation of the coronal T2 sequence. Brain: Mild-to-moderate generalized cerebral atrophy. Small chronic cortical/subcortical infarcts within the bilateral parietal and left occipital lobes, new from the prior brain MRI of 05/18/2020. Chronic hemosiderin deposition and/or cortical laminar necrosis associated with the chronic infarct in the right parietal lobe. Background multifocal T2 FLAIR hyperintense signal abnormality within the cerebral white matter and pons, nonspecific but compatible with mild chronic small vessel ischemic disease. Chronic lacunar infarct within the left thalamus, new from the prior MRI. Punctate chronic microhemorrhage within the left parietal lobe (series 14, image 29). Redemonstrated tiny chronic infarct within the left cerebellar hemisphere. Partially empty sella turcica. There is no acute  infarct. No evidence of an intracranial mass. No extra-axial fluid collection. No midline shift. Vascular: Maintained flow voids within the proximal large arterial vessels. Skull and upper cervical spine: No focal suspicious marrow lesion. Incompletely assessed cervical spondylosis. Sinuses/Orbits: No mass or acute finding within the imaged orbits. No significant paranasal sinus disease. Other: Nonspecific 6 mm soft tissue lesion arising from the right pterional scalp. IMPRESSION: 1. Intermittently motion degraded examination. 2. No evidence of an acute intracranial abnormality. Specifically, the diffusion-weighted imaging is of good quality and there is no evidence of an acute infarct. 3. Small chronic cortical/subcortical infarcts within the bilateral parietal and left occipital lobes, new from the prior brain MRI of 05/18/2020. 4. Chronic lacunar infarct within the left thalamus, new from the prior MRI. 5. Background mild chronic small vessel ischemic changes within the cerebral white matter and pons. 6. Redemonstrated tiny chronic infarct within the left cerebellar hemisphere. 7. Mild-to-moderate generalized cerebral atrophy. 8. 6 mm soft tissue lesion arising from the right pterional scalp. Direct examination recommended. Electronically Signed   By: Jackey Loge D.O.   On: 11/07/2022 12:34   CT ANGIO HEAD NECK W WO CM  Result Date: 11/07/2022 CLINICAL DATA:  Right-sided gaze, slumped over EXAM: CT ANGIOGRAPHY HEAD AND NECK WITH AND WITHOUT CONTRAST TECHNIQUE: Multidetector CT imaging of the head and neck was performed using the standard protocol during bolus administration of intravenous contrast. Multiplanar CT image reconstructions and MIPs were obtained to evaluate the vascular anatomy. Carotid stenosis measurements (when applicable) are obtained utilizing NASCET criteria, using the distal internal carotid diameter as the denominator. RADIATION DOSE REDUCTION: This exam was performed according to the  departmental dose-optimization program which includes automated exposure control, adjustment of the mA and/or kV according to patient size and/or use of iterative reconstruction technique. CONTRAST:  75mL OMNIPAQUE IOHEXOL 350 MG/ML SOLN COMPARISON:  Same-day noncontrast CT head, carotid Doppler 01/26/2019, CTA head/neck 11/15/2018 FINDINGS: Image quality is degraded by motion artifact particularly at the level of the carotid bifurcations. CTA NECK FINDINGS Aortic arch: There is calcified plaque in the imaged aortic arch. The origins of the major branch vessels are patent. The subclavian arteries are patent to the level imaged. Right carotid system: The right common, internal, and external carotid arteries appear patent, without  evidence of hemodynamically significant stenosis or occlusion there is no evidence of dissection or aneurysm, within the confines of above-described motion artifact. Left carotid system: The left common carotid artery is suboptimally evaluated due to motion and streak artifact from adjacent venous contrast. The bifurcation is also not well evaluated. The distal internal carotid artery in the neck is widely patent. There is no definite dissection or aneurysm, within the above-described confines. Vertebral arteries: There is mild stenosis of the origin of the right vertebral artery. The vertebral arteries are otherwise patent, without hemodynamically significant stenosis or occlusion. There is no evidence of dissection or aneurysm. Skeleton: There is multilevel disc space narrowing and degenerative endplate change of the cervical spine. There is no visible canal hematoma. Other neck: The soft tissues of the neck are unremarkable. Upper chest: The imaged lung apices are clear. Review of the MIP images confirms the above findings CTA HEAD FINDINGS Anterior circulation: There is calcified plaque in the intracranial ICAs resulting in up to moderate to severe stenosis bilaterally. The bilateral  MCAs and ACAs are patent, without proximal stenosis or occlusion. The anterior communicating artery is normal. There is no aneurysm or AVM. Posterior circulation: The bilateral V4 segments are patent with mild calcified plaque on the left. The basilar artery is patent. The major cerebellar arteries appear patent. The PCAs are patent, with short-segment moderate stenosis of the left P1/P2 segment (11-25). Posterior communicating arteries are not identified. There is no aneurysm or AVM. Venous sinuses: As permitted by contrast timing, patent. Anatomic variants: None. Review of the MIP images confirms the above findings IMPRESSION: 1. Image quality in the neck is degraded by motion artifact as well as streak artifact from venous contamination. Specifically, the left common carotid artery and carotid bifurcation are not well evaluated. The imaged left internal carotid artery in the neck is widely patent. 2. Patent right carotid system without hemodynamically significant stenosis or occlusion. Patent vertebral arteries with mild stenosis at the origin on the right. 3. Calcified plaque in the intracranial ICAs resulting in moderate to severe stenosis bilaterally, and short-segment moderate stenosis of the left P1/P2 segment. Electronically Signed   By: Lesia Hausen M.D.   On: 11/07/2022 11:43   CT HEAD WO CONTRAST ( )  Result Date: 11/07/2022 CLINICAL DATA:  Fall on blood thinners EXAM: CT HEAD WITHOUT CONTRAST TECHNIQUE: Contiguous axial images were obtained from the base of the skull through the vertex without intravenous contrast. RADIATION DOSE REDUCTION: This exam was performed according to the departmental dose-optimization program which includes automated exposure control, adjustment of the mA and/or kV according to patient size and/or use of iterative reconstruction technique. COMPARISON:  05/17/2020 FINDINGS: Brain: No evidence of acute infarction, hemorrhage, hydrocephalus, extra-axial collection or mass  lesion/mass effect. Periventricular and deep white matter hypodensity. New encephalomalacia of the right parietal vertex (4, image 20. Vascular: No hyperdense vessel or unexpected calcification. Skull: Normal. Negative for fracture or focal lesion. Sinuses/Orbits: No acute finding. Other: None. IMPRESSION: 1. No acute intracranial pathology. Small-vessel white matter disease. 2. New encephalomalacia of the right parietal vertex, consistent with interval, although nonacute infarction. Electronically Signed   By: Jearld Lesch M.D.   On: 11/07/2022 10:55    Procedures .Critical Care  Performed by: Gailen Shelter, PA Authorized by: Gailen Shelter, PA   Critical care provider statement:    Critical care time (minutes):  35   Critical care time was exclusive of:  Separately billable procedures and treating other patients and teaching time  Critical care was necessary to treat or prevent imminent or life-threatening deterioration of the following conditions:  Trauma (strokes, rib fractures/trauma)   Critical care was time spent personally by me on the following activities:  Development of treatment plan with patient or surrogate, discussions with consultants, evaluation of patient's response to treatment, examination of patient, ordering and review of laboratory studies, ordering and review of radiographic studies, ordering and performing treatments and interventions, pulse oximetry, re-evaluation of patient's condition and review of old charts   Care discussed with: admitting provider       Medications Ordered in ED Medications  acetaminophen (TYLENOL) tablet 1,000 mg (1,000 mg Oral Given 11/07/22 1439)  sodium chloride flush (NS) 0.9 % injection 3 mL (3 mLs Intravenous Given 11/07/22 1056)  iohexol (OMNIPAQUE) 350 MG/ML injection 75 mL (75 mLs Intravenous Contrast Given 11/07/22 1050)  LORazepam (ATIVAN) injection 1 mg (1 mg Intravenous Given 11/07/22 1117)    ED Course/ Medical Decision Making/  A&P                             Medical Decision Making Amount and/or Complexity of Data Reviewed Labs: ordered. Radiology: ordered.  Risk OTC drugs. Prescription drug management. Decision regarding hospitalization.   This patient presents to the ED for concern of syncope, this involves a number of treatment options, and is a complaint that carries with it a moderate risk of complications and morbidity. A differential diagnosis was considered for the patient's symptoms which is discussed below:   The differential diagnosis for syncope includes (but is not limited to) anemia, hypoglycemia, arrhythmia, seizure.  If associated with pain loss of consciousness can certainly also include subarachnoid hemorrhage, thoracic aortic dissection, PE, ectopic pregnancy, AAA.  Also with considering is a vasovagal reaction, hypovolemia/dehydration.    Co morbidities: Discussed in HPI   Brief History:   Patient is a 77 year old female with a past medical history significant for HTN, HLD, strokes, DM2, CHF, hypertensive heart disease, COPD  Patient presents emergency room today after a syncopal episode at home she fell in bathroom and struck her head on the ground.  She was brought today to the emergency room as a code stroke because of some right gaze preference and questionable weakness.  Seems that on arrival to the emergency department she was diffusely weak and had no gaze deviation.  Code stroke was canceled.  Patient did undergo MRI however.  She states that she has frequent episodes of syncope.  It seems that she has had hypoglycemic episodes as well    EMR reviewed including pt PMHx, past surgical history and past visits to ER.   See HPI for more details   Lab Tests:   I personally reviewed all laboratory work and imaging. Metabolic panel without any acute abnormality specifically kidney function within normal limits and no significant electrolyte abnormalities. CBC without  leukocytosis or significant anemia.   Imaging Studies:  Abnormal findings. I personally reviewed all imaging studies. Imaging notable for Multiple old strokes, some new strokes since last MRI.  Right chest x-ray with multiple rib fractures seems to be 3 total    Cardiac Monitoring:  The patient was maintained on a cardiac monitor.  I personally viewed and interpreted the cardiac monitored which showed an underlying rhythm of: NSR EKG non-ischemic   Medicines ordered:  I ordered medication including ativan for anxiety, tylenol, pain.  Reevaluation of the patient after these medicines showed that the patient  improved I have reviewed the patients home medicines and have made adjustments as needed   Critical Interventions:     Consults/Attending Physician   I discussed this case with my attending physician who cosigned this note including patient's presenting symptoms, physical exam, and planned diagnostics and interventions. Attending physician stated agreement with plan or made changes to plan which were implemented.  Discussed with Dr. Maryfrances Bunnell for admission.   Dr. Selina Cooley of neurology evaluated.   Reevaluation:  After the interventions noted above I re-evaluated patient and found that they have :stayed the same   Social Determinants of Health:      Problem List / ED Course:  Patient with recurrent syncopal episodes and seems to be having difficulty with her her activities of daily living at home.  During the syncopal episode patient suffered multiple rib fractures.  She does have an MRI today that shows multiple strokes that are new since her last MRI but no acute strokes.  Neurology aware of patient.  She will require admission may benefit from physical therapy   Dispostion:  After consideration of the diagnostic results and the patients response to treatment, I feel that the patent would benefit from admission       Final Clinical Impression(s) / ED  Diagnoses Final diagnoses:  Recurrent strokes (HCC)  Closed fracture of multiple ribs of right side, initial encounter    Rx / DC Orders ED Discharge Orders     None         Gailen Shelter, [place] 11/07/22 1531    Wynetta Fines, MD 11/07/22 [phone]

## 2022-11-07 NOTE — Assessment & Plan Note (Signed)
CTH rules out ICH - Continue Plavix, Zetia, Lipitor

## 2022-11-07 NOTE — Assessment & Plan Note (Signed)
BP elevated - Continue ARB - Continue hydralazine - Hold torsemide today

## 2022-11-07 NOTE — Assessment & Plan Note (Signed)
No flare 

## 2022-11-07 NOTE — ED Notes (Signed)
ED TO INPATIENT HANDOFF REPORT  ED Nurse Name and Phone #: Kevante Lunt [phone]  S Name/Age/Gender [name] 77 y.o. female Room/Bed: 023C/023C  Code Status   Code Status: Prior  Home/SNF/Other Home Patient oriented to: self, place, time, and situation Is this baseline? Yes   Triage Complete: Triage complete  Chief Complaint Syncope [R55]  Triage Note BIB by EMS. Pt was in bathroom and fell on tile floor and hit her head. Patient is on thinners. Pt was having a right sided gaze and it was called as code stroke, and it was canceled due to her symptoms resolving. Pt was taken to CT due to fall. Pt is complaining of right side pain.    Allergies Allergies  Allergen Reactions   Alendronate     Other reaction(s): Other (See Comments) Makes me feel very weak   Valsartan Nausea And Vomiting    Level of Care/Admitting Diagnosis ED Disposition     ED Disposition  Admit   Condition  --   Comment  Hospital Area: [facility] [[id number]]  Level of Care: Telemetry Medical [104]  May place patient in observation at [facility] or [facility] if equivalent level of care is available:: No  Covid Evaluation: Asymptomatic - no recent exposure (last 10 days) testing not required  Diagnosis: Syncope [206001]  Admitting Physician: Alberteen Sam [[id number]]  Attending Physician: Alberteen Sam [[id number]]          B Medical/Surgery History Past Medical History:  Diagnosis Date   Acute pain of left knee    Angina pectoris (HCC) 07/28/2016   With normal coronary arteriography   Carotid stenosis, right 11/18/2018   Cerebral infarction due to stenosis of right carotid artery (HCC) s/p tPA 11/15/2018   CHF (congestive heart failure) (HCC)    Chronic diastolic congestive heart failure (HCC) 07/28/2016   Chronic venous insufficiency 09/27/2021   COPD (chronic obstructive pulmonary disease) (HCC) 01/14/2018   Diabetes mellitus type II, uncontrolled  11/18/2018   Diabetes mellitus without complication (HCC)    Diabetic polyneuropathy associated with type 2 diabetes mellitus (HCC) 11/30/2015   Fall 11/21/2018   Family hx-stroke 11/18/2018   Hyperlipidemia    Hypertension    Hypertensive heart disease with heart failure (HCC) 07/28/2016   Living in nursing home    Lumbar disc narrowing    Onychogryposis of toenail 09/07/2020   Osteoporosis    Overgrown toenails 12/16/2020   Pustules determined by examination 04/11/2018   Spondylosis    lumbosacral   Stroke [facility])    Past Surgical History:  Procedure Laterality Date   ABDOMINAL HYSTERECTOMY     APPENDECTOMY     CARDIAC CATHETERIZATION     CHOLECYSTECTOMY     ENDARTERECTOMY Right 11/22/2018   Procedure: ENDARTERECTOMY CAROTID RIGHT;  Surgeon: Cephus Shelling, MD;  Location: [facility] OR;  Service: Vascular;  Laterality: Right;   HIP SURGERY  07/16/2019   Had to repair a lot of damage   KNEE ARTHROCENTESIS  11/22/2018       PATCH ANGIOPLASTY Right 11/22/2018   Procedure: Patch Angioplasty Right Carotid Artery using Xenosure Biologic Patch;  Surgeon: Cephus Shelling, MD;  Location[facility];  Service: Vascular;  Laterality: Right;     A IV Location/Drains/Wounds Patient Lines/Drains/Airways Status     Active Line/Drains/Airways     Name Placement date Placement time Site Days   Peripheral IV 11/07/22 Left Antecubital 11/07/22  1049  Antecubital  less than 1  Peripheral IV 11/07/22 Posterior;Right Hand 11/07/22  1054  Hand  less than 1   Incision (Closed) 11/22/18 Neck Right 11/22/18  0906  -- 1446            Intake/Output Last 24 hours No intake or output data in the 24 hours ending 11/07/22 1527  Labs/Imaging Results for orders placed or performed during the hospital encounter of 11/07/22 (from the past 48 hour(s))  Protime-INR     Status: None   Collection Time: 11/07/22 10:50 AM  Result Value Ref Range   Prothrombin Time 14.1 11.4 - 15.2 seconds   INR 1.1 0.8 - 1.2     Comment: (NOTE) INR goal varies based on device and disease states. Performed at [facility], [address], [place], [place] [address]   APTT     Status: None   Collection Time: 11/07/22 10:50 AM  Result Value Ref Range   aPTT 27 24 - 36 seconds    Comment: Performed at [facility], [address] [place], [place] [address]  CBC     Status: Abnormal   Collection Time: 11/07/22 10:50 AM  Result Value Ref Range   WBC 12.8 (H) 4.0 - 10.5 K/uL   RBC 4.72 3.87 - 5.11 MIL/uL   Hemoglobin 12.6 12.0 - 15.0 g/dL   HCT 09.8 11.9 - 14.7 %   MCV 87.3 80.0 - 100.0 fL   MCH 26.7 26.0 - 34.0 pg   MCHC 30.6 30.0 - 36.0 g/dL   RDW 82.9 56.2 - 13.0 %   Platelets 293 150 - 400 K/uL   nRBC 0.0 0.0 - 0.2 %    Comment: Performed at [facility], [address] [place], [place] [address]  Differential     Status: Abnormal   Collection Time: 11/07/22 10:50 AM  Result Value Ref Range   Neutrophils Relative % 70 %   Neutro Abs 9.1 (H) 1.7 - 7.7 K/uL   Lymphocytes Relative 18 %   Lymphs Abs 2.3 0.7 - 4.0 K/uL   Monocytes Relative 9 %   Monocytes Absolute 1.1 (H) 0.1 - 1.0 K/uL   Eosinophils Relative 1 %   Eosinophils Absolute 0.1 0.0 - 0.5 K/uL   Basophils Relative 1 %   Basophils Absolute 0.1 0.0 - 0.1 K/uL   Immature Granulocytes 1 %   Abs Immature Granulocytes 0.11 (H) 0.00 - 0.07 K/uL    Comment: Performed at [facility], [address] [place], [place] [address]  Comprehensive metabolic panel     Status: Abnormal   Collection Time: 11/07/22 10:50 AM  Result Value Ref Range   Sodium 140 135 - 145 mmol/L   Potassium 3.4 (L) 3.5 - 5.1 mmol/L   Chloride 105 98 - 111 mmol/L   CO2 26 22 - 32 mmol/L   Glucose, Bld 66 (L) 70 - 99 mg/dL    Comment: Glucose reference range applies only to samples taken after fasting for at least 8 hours.   BUN 10 8 - 23 mg/dL   Creatinine, Ser 9.52 (H) 0.44 - 1.00 mg/dL   Calcium 8.5 (L) 8.9 - 10.3 mg/dL   Total Protein 6.5 6.5 -  8.1 g/dL   Albumin 3.0 (L) 3.5 - 5.0 g/dL   AST 23 15 - 41 U/L   ALT 15 0 - 44 U/L   Alkaline Phosphatase 71 38 - 126 U/L   Total Bilirubin 0.3 0.3 - 1.2 mg/dL   GFR,  Estimated 57 (L) >60 mL/min    Comment: (NOTE) Calculated using the CKD-EPI Creatinine Equation (2021)    Anion gap 9 5 - 15    Comment: Performed at [facility], [address], [place], [place] [address]  Ethanol     Status: None   Collection Time: 11/07/22 10:50 AM  Result Value Ref Range   Alcohol, Ethyl (B) <10 <10 mg/dL    Comment: (NOTE) Lowest detectable limit for serum alcohol is 10 mg/dL.  For medical purposes only. Performed at [facility], [address] [place], [place] [address]   CBG monitoring, ED     Status: None   Collection Time: 11/07/22  1:44 PM  Result Value Ref Range   Glucose-Capillary 88 70 - 99 mg/dL    Comment: Glucose reference range applies only to samples taken after fasting for at least 8 hours.   DG Ribs Unilateral W/Chest Right  Result Date: 11/07/2022 CLINICAL DATA:  Status post fall with right lateral/anterior rib pain EXAM: RIGHT RIBS AND CHEST - 3 VIEW COMPARISON:  Chest radiograph dated 03/26/2022 FINDINGS: Normal lung volumes. No focal consolidations. No pneumothorax. Focal pleural thickening along the lower right lateral lung adjacent to the rib fractures. The heart size and mediastinal contours are within normal limits. Displaced fractures of the right lateral seventh through ninth ribs. IMPRESSION: 1. Displaced fractures of the right lateral seventh through ninth ribs. 2. Focal pleural thickening along the lower right lateral lung adjacent to the rib fractures, likely a small pleural effusion. No pneumothorax. Electronically Signed   By: Agustin Cree M.D.   On: 11/07/2022 14:02   MR BRAIN WO CONTRAST  Result Date: 11/07/2022 CLINICAL DATA:  Provided history: Syncope/presyncope, cerebrovascular cause suspected. Right-sided weakness/pain. EXAM: MRI HEAD WITHOUT  CONTRAST TECHNIQUE: Multiplanar, multiecho pulse sequences of the brain and surrounding structures were obtained without intravenous contrast. COMPARISON:  Non-contrast head CT and CT angiogram head/neck performed earlier today 11/07/2022. Brain MRI 05/18/2020. FINDINGS: Intermittently motion degraded examination, limiting evaluation. Most notably, there is moderate motion degradation of the axial T2 FLAIR sequence and moderate motion degradation of the coronal T2 sequence. Brain: Mild-to-moderate generalized cerebral atrophy. Small chronic cortical/subcortical infarcts within the bilateral parietal and left occipital lobes, new from the prior brain MRI of 05/18/2020. Chronic hemosiderin deposition and/or cortical laminar necrosis associated with the chronic infarct in the right parietal lobe. Background multifocal T2 FLAIR hyperintense signal abnormality within the cerebral white matter and pons, nonspecific but compatible with mild chronic small vessel ischemic disease. Chronic lacunar infarct within the left thalamus, new from the prior MRI. Punctate chronic microhemorrhage within the left parietal lobe (series 14, image 29). Redemonstrated tiny chronic infarct within the left cerebellar hemisphere. Partially empty sella turcica. There is no acute infarct. No evidence of an intracranial mass. No extra-axial fluid collection. No midline shift. Vascular: Maintained flow voids within the proximal large arterial vessels. Skull and upper cervical spine: No focal suspicious marrow lesion. Incompletely assessed cervical spondylosis. Sinuses/Orbits: No mass or acute finding within the imaged orbits. No significant paranasal sinus disease. Other: Nonspecific 6 mm soft tissue lesion arising from the right pterional scalp. IMPRESSION: 1. Intermittently motion degraded examination. 2. No evidence of an acute intracranial abnormality. Specifically, the diffusion-weighted imaging is of good quality and there is no evidence of  an acute infarct. 3. Small chronic cortical/subcortical infarcts within the bilateral parietal and left occipital lobes, new from the prior brain MRI of 05/18/2020. 4. Chronic lacunar infarct within  the left thalamus, new from the prior MRI. 5. Background mild chronic small vessel ischemic changes within the cerebral white matter and pons. 6. Redemonstrated tiny chronic infarct within the left cerebellar hemisphere. 7. Mild-to-moderate generalized cerebral atrophy. 8. 6 mm soft tissue lesion arising from the right pterional scalp. Direct examination recommended. Electronically Signed   By: Jackey Loge D.O.   On: 11/07/2022 12:34   CT ANGIO HEAD NECK W WO CM  Result Date: 11/07/2022 CLINICAL DATA:  Right-sided gaze, slumped over EXAM: CT ANGIOGRAPHY HEAD AND NECK WITH AND WITHOUT CONTRAST TECHNIQUE: Multidetector CT imaging of the head and neck was performed using the standard protocol during bolus administration of intravenous contrast. Multiplanar CT image reconstructions and MIPs were obtained to evaluate the vascular anatomy. Carotid stenosis measurements (when applicable) are obtained utilizing NASCET criteria, using the distal internal carotid diameter as the denominator. RADIATION DOSE REDUCTION: This exam was performed according to the departmental dose-optimization program which includes automated exposure control, adjustment of the mA and/or kV according to patient size and/or use of iterative reconstruction technique. CONTRAST:  75mL OMNIPAQUE IOHEXOL 350 MG/ML SOLN COMPARISON:  Same-day noncontrast CT head, carotid Doppler 01/26/2019, CTA head/neck 11/15/2018 FINDINGS: Image quality is degraded by motion artifact particularly at the level of the carotid bifurcations. CTA NECK FINDINGS Aortic arch: There is calcified plaque in the imaged aortic arch. The origins of the major branch vessels are patent. The subclavian arteries are patent to the level imaged. Right carotid system: The right common,  internal, and external carotid arteries appear patent, without evidence of hemodynamically significant stenosis or occlusion there is no evidence of dissection or aneurysm, within the confines of above-described motion artifact. Left carotid system: The left common carotid artery is suboptimally evaluated due to motion and streak artifact from adjacent venous contrast. The bifurcation is also not well evaluated. The distal internal carotid artery in the neck is widely patent. There is no definite dissection or aneurysm, within the above-described confines. Vertebral arteries: There is mild stenosis of the origin of the right vertebral artery. The vertebral arteries are otherwise patent, without hemodynamically significant stenosis or occlusion. There is no evidence of dissection or aneurysm. Skeleton: There is multilevel disc space narrowing and degenerative endplate change of the cervical spine. There is no visible canal hematoma. Other neck: The soft tissues of the neck are unremarkable. Upper chest: The imaged lung apices are clear. Review of the MIP images confirms the above findings CTA HEAD FINDINGS Anterior circulation: There is calcified plaque in the intracranial ICAs resulting in up to moderate to severe stenosis bilaterally. The bilateral MCAs and ACAs are patent, without proximal stenosis or occlusion. The anterior communicating artery is normal. There is no aneurysm or AVM. Posterior circulation: The bilateral V4 segments are patent with mild calcified plaque on the left. The basilar artery is patent. The major cerebellar arteries appear patent. The PCAs are patent, with short-segment moderate stenosis of the left P1/P2 segment (11-25). Posterior communicating arteries are not identified. There is no aneurysm or AVM. Venous sinuses: As permitted by contrast timing, patent. Anatomic variants: None. Review of the MIP images confirms the above findings IMPRESSION: 1. Image quality in the neck is degraded  by motion artifact as well as streak artifact from venous contamination. Specifically, the left common carotid artery and carotid bifurcation are not well evaluated. The imaged left internal carotid artery in the neck is widely patent. 2. Patent right carotid system without hemodynamically significant stenosis or occlusion. Patent vertebral arteries with  mild stenosis at the origin on the right. 3. Calcified plaque in the intracranial ICAs resulting in moderate to severe stenosis bilaterally, and short-segment moderate stenosis of the left P1/P2 segment. Electronically Signed   By: Lesia Hausen M.D.   On: 11/07/2022 11:43   CT HEAD WO CONTRAST ( )  Result Date: 11/07/2022 CLINICAL DATA:  Fall on blood thinners EXAM: CT HEAD WITHOUT CONTRAST TECHNIQUE: Contiguous axial images were obtained from the base of the skull through the vertex without intravenous contrast. RADIATION DOSE REDUCTION: This exam was performed according to the departmental dose-optimization program which includes automated exposure control, adjustment of the mA and/or kV according to patient size and/or use of iterative reconstruction technique. COMPARISON:  05/17/2020 FINDINGS: Brain: No evidence of acute infarction, hemorrhage, hydrocephalus, extra-axial collection or mass lesion/mass effect. Periventricular and deep white matter hypodensity. New encephalomalacia of the right parietal vertex (4, image 20. Vascular: No hyperdense vessel or unexpected calcification. Skull: Normal. Negative for fracture or focal lesion. Sinuses/Orbits: No acute finding. Other: None. IMPRESSION: 1. No acute intracranial pathology. Small-vessel white matter disease. 2. New encephalomalacia of the right parietal vertex, consistent with interval, although nonacute infarction. Electronically Signed   By: Jearld Lesch M.D.   On: 11/07/2022 10:55    Pending Labs Unresulted Labs (From admission, onward)     Start     Ordered   11/07/22 1045  Urinalysis, Routine  w reflex microscopic -Urine, Clean Catch  Once,   URGENT       Question:  Specimen Source  Answer:  Urine, Clean Catch   11/07/22 1044            Vitals/Pain Today's Vitals   11/07/22 1059 11/07/22 1100 11/07/22 1104 11/07/22 1510  BP:  (!) 172/80    Pulse:  71    Resp:  18    Temp:   98.9 F (37.2 C)   TempSrc:   Oral   SpO2:  98%    PainSc: 10-Worst pain ever   3     Isolation Precautions No active isolations  Medications Medications  acetaminophen (TYLENOL) tablet 1,000 mg (1,000 mg Oral Given 11/07/22 1439)  sodium chloride flush (NS) 0.9 % injection 3 mL (3 mLs Intravenous Given 11/07/22 1056)  iohexol (OMNIPAQUE) 350 MG/ML injection 75 mL (75 mLs Intravenous Contrast Given 11/07/22 1050)  LORazepam (ATIVAN) injection 1 mg (1 mg Intravenous Given 11/07/22 1117)    Mobility walks     Focused Assessments    R Recommendations: See Admitting Provider Note  Report given to:   Additional Notes: Stroke screen every 2hours until 11pm. Then it goes to every 4 hours

## 2022-11-07 NOTE — Assessment & Plan Note (Addendum)
Patient's ability to articulate her symptoms is limited.  She notes several episodes of "blacking out" suddenly over the last few months, and confirms this is what she saw Dr. Dulce Sellar for 2 weeks ago.  None of these episodes happened while standing, rather they always happened while seated in her wheelchair, and without prodrome.  EMS run sheet reviewed.  They clearly report she slumped over and was unresponsive, and I think this is more likely a syncope and less likely a TIA.   Unfortunately, she had just taken off her Zio patch to send it back this morning prior to admission. CTA shows patent vertebral arteries. Electrolytes normal. No symptoms of pneumonia, UTI.  No fever.  Glucose low on arrival here, but normal at the time of EMS arrival.    - Obtain echo - Monitor on tele - Check orthostatics - Hold torsemide and metolazone until orthostatics done

## 2022-11-07 NOTE — Hospital Course (Addendum)
[name] is a 77 y.o. F with HTN, DM, hx CVA without residual deficits, dCHF, carotid PVD, and COPD who presented with syncope.  Evidently, started to have syncopes without prodrome about 1-2 months ago, several times per week.  Went to Cardiology 2 weeks ago, Zio patch placed and echo ordered (not yet done).  Today, was at home, by an unfortunate stroke of luck, had *just* removed the Zio to send it back, when she passed out in the toiley, fell striking her ribs on the commode and then woke on the floor. Called her husband from the other room who activated 9-1-1.    EMS arrived, and she was initially normal, blood glucose 104.  While EMS were finishing their initial assessment, she again slumped over, and had "gaze preference", "drooling" and was unresponsive, so EMS activated CODE STROKE.  This was canceled en route due to resolution of symptoms.  In the ER, glucose 66, WBC 12K.  CTH showed old infarct, CTA showed no LVO.  MRI brain ruled out infarct.    CXR showed right 7-9th rib fracture.  Hospitalists consulted for admission for syncope.

## 2022-11-07 NOTE — Assessment & Plan Note (Addendum)
Appears euvolemic - Hold torsemide metolazone until orthostatics done   - Continue Farxiga

## 2022-11-07 NOTE — H&P (Addendum)
History and Physical    Patient: [name]:[id number] DOB: [birth year] DOA: 11/07/2022 DOS: the patient was seen and examined on 11/07/2022 PCP: Paulina Fusi, MD  Patient coming from: Home  Chief Complaint:  Chief Complaint  Patient presents with   Fall       HPI:  [name] is a 77 y.o. F with HTN, DM, hx CVA without residual deficits, dCHF, carotid PVD, and COPD who presented with syncope.  Evidently, started to have syncopes without prodrome about 1-2 months ago, several times per week.  Went to Cardiology 2 weeks ago, Zio patch placed and echo ordered (not yet done).  Today, was at home, by an unfortunate stroke of luck, had *just* removed the Zio to send it back, when she passed out in the toiley, fell striking her ribs on the commode and then woke on the floor. Called her husband from the other room who activated 9-1-1.    EMS arrived, and she was initially normal, blood glucose 104.  While EMS were finishing their initial assessment, she again slumped over, and had "gaze preference", "drooling" and was unresponsive, so EMS activated CODE STROKE.  This was canceled en route due to resolution of symptoms.  In the ER, glucose 66, WBC 12K.  CTH showed old infarct, CTA showed no LVO.  MRI brain ruled out infarct.    CXR showed right 7-9th rib fracture.  Hospitalists consulted for admission for syncope.   Of note, Zio patch report obtained from Cardiology.  Patient believes she had one episode "a week ago" while wearing the patch, although she seems very unreliable in this report.  The Zio patch report had some scattered ectopy only, and one 5-beat run of VT, no other arrhythmias noted.        Review of Systems  Constitutional:  Negative for chills, fever and malaise/fatigue.  Respiratory:  Negative for cough and shortness of breath.   Cardiovascular:  Negative for chest pain, orthopnea and leg swelling.  Genitourinary:  Negative for dysuria, frequency and  urgency.  Musculoskeletal:  Positive for falls.  Neurological:  Positive for loss of consciousness and weakness (bilateral leg). Negative for dizziness, tingling, tremors, sensory change, speech change, focal weakness, seizures and headaches.  All other systems reviewed and are negative.    Past Medical History:  Diagnosis Date   Acute pain of left knee    Angina pectoris (HCC) 07/28/2016   With normal coronary arteriography   Carotid stenosis, right 11/18/2018   Cerebral infarction due to stenosis of right carotid artery (HCC) s/p tPA 11/15/2018   CHF (congestive heart failure) (HCC)    Chronic diastolic congestive heart failure (HCC) 07/28/2016   Chronic venous insufficiency 09/27/2021   COPD (chronic obstructive pulmonary disease) (HCC) 01/14/2018   Diabetes mellitus type II, uncontrolled 11/18/2018   Diabetes mellitus without complication (HCC)    Diabetic polyneuropathy associated with type 2 diabetes mellitus (HCC) 11/30/2015   Fall 11/21/2018   Family hx-stroke 11/18/2018   Hyperlipidemia    Hypertension    Hypertensive heart disease with heart failure (HCC) 07/28/2016   Living in nursing home    Lumbar disc narrowing    Onychogryposis of toenail 09/07/2020   Osteoporosis    Overgrown toenails 12/16/2020   Pustules determined by examination 04/11/2018   Spondylosis    lumbosacral   Stroke [facility])    Past Surgical History:  Procedure Laterality Date   ABDOMINAL HYSTERECTOMY     APPENDECTOMY     CARDIAC CATHETERIZATION  CHOLECYSTECTOMY     ENDARTERECTOMY Right 11/22/2018   Procedure: ENDARTERECTOMY CAROTID RIGHT;  Surgeon: Cephus Shelling, MD;  Location: [facility] OR;  Service: Vascular;  Laterality: Right;   HIP SURGERY  07/16/2019   Had to repair a lot of damage   KNEE ARTHROCENTESIS  11/22/2018       PATCH ANGIOPLASTY Right 11/22/2018   Procedure: Patch Angioplasty Right Carotid Artery using Xenosure Biologic Patch;  Surgeon: Cephus Shelling, MD;  Location: [facility] OR;  Service:  Vascular;  Laterality: Right;   Social History:  reports that she has never smoked. She has never been exposed to tobacco smoke. She has never used smokeless tobacco. She reports that she does not currently use alcohol. She reports that she does not use drugs.  Allergies  Allergen Reactions   Alendronate     Other reaction(s): Other (See Comments) Makes me feel very weak   Valsartan Nausea And Vomiting    Family History  Problem Relation Age of Onset   Stroke Mother    Hypertension Mother    Diabetes Mother    Heart disease Mother    Prostate cancer Father    Alcohol abuse Father    CAD Sister    CAD Sister    Valvular heart disease Sister     Prior to Admission medications   Medication Sig Start Date End Date Taking? Authorizing Provider  atorvastatin (LIPITOR) 80 MG tablet Take 80 mg by mouth at bedtime.     [provider]  clopidogrel (PLAVIX) 75 MG tablet Take 75 mg by mouth every morning.     [provider]  ezetimibe (ZETIA) 10 MG tablet Take 10 mg by mouth every morning.     [provider]  FARXIGA 10 MG TABS tablet Take 10 mg by mouth daily. 07/04/21   [provider]  gabapentin (NEURONTIN) 300 MG capsule Take 300 mg by mouth in the morning and at bedtime.    [provider]  glimepiride (AMARYL) 4 MG tablet Take 1 tablet (4 mg total) by mouth 2 (two) times daily with a meal. 11/18/18   Layne Benton, NP  HUMALOG 100 UNIT/ML injection Inject 30 Units into the skin 3 (three) times daily with meals. 08/31/21   [provider]  hydrALAZINE (APRESOLINE) 25 MG tablet Take 25 mg by mouth in the morning, at noon, and at bedtime. 12/23/19   [provider]  insulin glargine (LANTUS) 100 UNIT/ML injection Inject 40 Units into the skin 2 (two) times daily.    [provider]  isosorbide mononitrate (IMDUR) 30 MG 24 hr tablet Take 1 tablet (30 mg total) by mouth daily. 11/16/20 07/21/22  Baldo Daub, MD   metolazone (ZAROXOLYN) 2.5 MG tablet Take 2.5 mg by mouth once a week. Take a 2nd Metolazone if needed for fluid and BP    [provider]  nitroGLYCERIN (NITROSTAT) 0.4 MG SL tablet Place 1 tablet (0.4 mg total) under the tongue every 5 (five) minutes as needed for chest pain. 02/10/21   Baldo Daub, MD  potassium chloride SA (KLOR-CON M) 20 MEQ tablet Take 20 mEq by mouth daily. 04/10/22   [provider]  rOPINIRole (REQUIP) 1 MG tablet Take 1 mg by mouth at bedtime.     [provider]  torsemide (DEMADEX) 100 MG tablet Take 100 mg by mouth 2 (two) times daily.    [provider]  valsartan (DIOVAN) 320 MG tablet Take 320 mg by mouth  daily. 12/23/19   [provider]    Physical Exam: Vitals:   11/07/22 1100 11/07/22 1104 11/07/22 1533 11/07/22 1627  BP: (!) 172/80   (!) 161/87  Pulse: 71     Resp: 18     Temp:  98.9 F (37.2 C) 98.4 F (36.9 C) 98.4 F (36.9 C)  TempSrc:  Oral Oral Oral  SpO2: 98%   100%   Elderly adult female, lying in bed, talking on the phone Anicteric, conjunctival pink, lids and lashes normal.  No nasal deformity, discharge, or epistaxis Oropharynx moist, edentulous, no oral lesions, lips normal RRR, no murmurs, no peripheral edema, chronic venous stasis change, no JVD Respiratory rate normal, lungs clear without rales or wheezes Abdomen soft, she has tenderness over the right ribs, but no abdominal tenderness or rebound Attention normal, affect normal, judgment and insight appear impaired but at baseline, face symmetric, speech fluent, upper extremity strength 4/5 and symmetric, appears at baseline, slightly limited by right arm pain due to fall, bilateral lower extremity strength 3/5 symmetric, sensation intact to light touch, naming intact, oriented to person, place, and time    Data Reviewed: Discussed with neuro team Basic metabolic panel shows normal renal function electrolytes LFTs normal CBC shows  mild leukocytosis INR and PTT normal Alcohol level negative CT head shows parietal right encephalomalacia CT angiogram of the head shows some intracranial ICA stenosis, as well as right P1/P2 stenosis, no vertebral artery stenosis MRI brain shows no acute infarct Rib x-ray shows right seventh and ninth x-ray with small effusion EKG, personally reviewed, shows sinus rhythm, no ST changes      Assessment and Plan: * Syncope Patient's ability to articulate her symptoms is limited.  She notes several episodes of "blacking out" suddenly over the last few months, and confirms this is what she saw Dr. Dulce Sellar for 2 weeks ago.  None of these episodes happened while standing, rather they always happened while seated in her wheelchair, and without prodrome.  EMS run sheet reviewed.  They clearly report she slumped over and was unresponsive, and I think this is more likely a syncope and less likely a TIA.   Unfortunately, she had just taken off her Zio patch to send it back this morning prior to admission. CTA shows patent vertebral arteries. Electrolytes normal. No symptoms of pneumonia, UTI.  No fever.  Glucose low on arrival here, but normal at the time of EMS arrival.    - Obtain echo - Monitor on tele - Check orthostatics - Hold torsemide and metolazone until orthostatics done    Right 7-9th rib fractures Fell on the commode. - Scheduled acetaminophen - Oxycodone PRN - IS - PT eval  Hypertension BP elevated - Continue ARB - Continue hydralazine - Hold torsemide today  Cerebrovascular disease CTH rules out ICH - Continue Plavix, Zetia, Lipitor  Uncontrolled type 2 diabetes mellitus with hypoglycemia, with long-term current use of insulin (HCC) Glucose normal - Continue home glargine 30 BID (slightly lower than home dose) - Start SS corrections - Hold home glimepiride  Carotid stenosis, right See below  COPD (chronic obstructive pulmonary disease) (HCC) No  flare  Hyperlipidemia - Continue Lipitor and Zetia  Chronic diastolic congestive heart failure (HCC) Appears euvolemic - Hold torsemide metolazone until orthostatics done   - Continue Farxiga         Advance Care Planning: DNR, confirmed specifically with patient  Consults: None needed  Family Communication: None present  Severity of Illness: The appropriate patient  status for this patient is OBSERVATION. Observation status is judged to be reasonable and necessary in order to provide the required intensity of service to ensure the patient's safety. The patient's presenting symptoms, physical exam findings, and initial radiographic and laboratory data in the context of their medical condition is felt to place them at decreased risk for further clinical deterioration. Furthermore, it is anticipated that the patient will be medically stable for discharge from the hospital within 2 midnights of admission.   Author: Alberteen Sam, MD 11/07/2022 4:39 PM  For on call review [email or web address].

## 2022-11-07 NOTE — Assessment & Plan Note (Signed)
Glucose normal - Continue home glargine 30 BID (slightly lower than home dose) - Start SS corrections - Hold home glimepiride

## 2022-11-07 NOTE — Assessment & Plan Note (Signed)
See below

## 2022-11-07 NOTE — ED Notes (Signed)
Stroke screen every 2 hours for 12 hours starting at 11am. Next check will be at 1pm. Then it goes to every 4 hours.

## 2022-11-07 NOTE — Progress Notes (Signed)
Orthopedic Tech Progress Note Patient Details:  [name] 06-08-1945 [id number]  Level 2 trauma   Patient ID: [name], female   DOB: 10/08/1945, 77 y.o.   MRN: [id number]  Donald Pore 11/07/2022, 10:39 AM

## 2022-11-07 NOTE — Progress Notes (Signed)
Neurology Consultation  Reason for Consult: Syncope Referring Physician: Rodena Medin  CC: Weakness, fall   History is obtained from: patient and EMS personnel  HPI: [name] is a 77 y.o. female with a past medical history of previous stroke, carotid stenosis, CHF, COPD, DM2, HTN, HLD  presenting with after a fall in her bathroom. She was in her bathroom when she fell backwards and hit her head on the tile floor. Her husband called EMS. On EMS arrival, the patient was able to assist them to get her into a sitting and then standing position. She requested to sit on a stool and at that time she slumped over and had a right gaze at 922. EMS activated a code stroke. En route her symptoms fully resolved. Code stroke was cancelled. Level 2 trauma for fall on thinners activated. Neurology consulted for TIA/syncope work up.   LKW: 0922 TNK given?: No fall on thinners  ROS: Full ROS was performed and is negative except as noted in the HPI.   Past Medical History:  Diagnosis Date   Acute pain of left knee    Angina pectoris (HCC) 07/28/2016   With normal coronary arteriography   Carotid stenosis, right 11/18/2018   Cerebral infarction due to stenosis of right carotid artery (HCC) s/p tPA 11/15/2018   CHF (congestive heart failure) (HCC)    Chronic diastolic congestive heart failure (HCC) 07/28/2016   Chronic venous insufficiency 09/27/2021   COPD (chronic obstructive pulmonary disease) (HCC) 01/14/2018   Diabetes mellitus type II, uncontrolled 11/18/2018   Diabetes mellitus without complication (HCC)    Diabetic polyneuropathy associated with type 2 diabetes mellitus (HCC) 11/30/2015   Fall 11/21/2018   Family hx-stroke 11/18/2018   Hyperlipidemia    Hypertension    Hypertensive heart disease with heart failure (HCC) 07/28/2016   Living in nursing home    Lumbar disc narrowing    Onychogryposis of toenail 09/07/2020   Osteoporosis    Overgrown toenails 12/16/2020   Pustules determined by examination  04/11/2018   Spondylosis    lumbosacral   Stroke (HCC)     Family History  Problem Relation Age of Onset   Stroke Mother    Hypertension Mother    Diabetes Mother    Heart disease Mother    Prostate cancer Father    Alcohol abuse Father    CAD Sister    CAD Sister    Valvular heart disease Sister     Social History:   reports that she has never smoked. She has never been exposed to tobacco smoke. She has never used smokeless tobacco. She reports that she does not currently use alcohol. She reports that she does not use drugs.  Medications  Current Facility-Administered Medications:    sodium chloride flush (NS) 0.9 % injection 3 mL, 3 mL, Intravenous, Once, Messick, Noralyn Pick, MD  Current Outpatient Medications:    atorvastatin (LIPITOR) 80 MG tablet, Take 80 mg by mouth at bedtime. , Disp: , Rfl:    clopidogrel (PLAVIX) 75 MG tablet, Take 75 mg by mouth every morning. , Disp: , Rfl:    ezetimibe (ZETIA) 10 MG tablet, Take 10 mg by mouth every morning. , Disp: , Rfl:    FARXIGA 10 MG TABS tablet, Take 10 mg by mouth daily., Disp: , Rfl:    gabapentin (NEURONTIN) 300 MG capsule, Take 300 mg by mouth in the morning and at bedtime., Disp: , Rfl:    glimepiride (AMARYL) 4 MG tablet, Take 1 tablet (  4 mg total) by mouth 2 (two) times daily with a meal., Disp: 60 tablet, Rfl: 2   HUMALOG 100 UNIT/ML injection, Inject 30 Units into the skin 3 (three) times daily with meals., Disp: , Rfl:    hydrALAZINE (APRESOLINE) 25 MG tablet, Take 25 mg by mouth in the morning, at noon, and at bedtime., Disp: , Rfl:    insulin glargine (LANTUS) 100 UNIT/ML injection, Inject 40 Units into the skin 2 (two) times daily., Disp: , Rfl:    isosorbide mononitrate (IMDUR) 30 MG 24 hr tablet, Take 1 tablet (30 mg total) by mouth daily., Disp: 90 tablet, Rfl: 3   metolazone (ZAROXOLYN) 2.5 MG tablet, Take 2.5 mg by mouth once a week. Take a 2nd Metolazone if needed for fluid and BP, Disp: , Rfl:    nitroGLYCERIN  (NITROSTAT) 0.4 MG SL tablet, Place 1 tablet (0.4 mg total) under the tongue every 5 (five) minutes as needed for chest pain., Disp: 30 tablet, Rfl: 3   potassium chloride SA (KLOR-CON M) 20 MEQ tablet, Take 20 mEq by mouth daily., Disp: , Rfl:    rOPINIRole (REQUIP) 1 MG tablet, Take 1 mg by mouth at bedtime. , Disp: , Rfl:    torsemide (DEMADEX) 100 MG tablet, Take 100 mg by mouth 2 (two) times daily., Disp: , Rfl:    valsartan (DIOVAN) 320 MG tablet, Take 320 mg by mouth daily., Disp: , Rfl:    Exam: Current vital signs: There were no vitals taken for this visit. Vital signs in last 24 hours:    GENERAL: Awake, alert in NAD HEENT: - Normocephalic and atraumatic, dry mm, no LN++, no Thyromegally LUNGS - Clear to auscultation bilaterally with no wheezes CV - S1S2 RRR, no m/r/g, equal pulses bilaterally. ABDOMEN - Soft, nontender, nondistended with normoactive BS Ext: warm, well perfused, intact peripheral pulses, no edema  NEURO:  Mental Status: AA&Ox3  Language: speech is clear.  repetition, fluency, and comprehension intact. Incorrectly identifies hammock and glove Cranial Nerves: PERRL, EOMI, visual fields full, no facial asymmetry, facial sensation intact, hearing intact, tongue/uvula/soft palate midline, normal sternocleidomastoid and trapezius muscle strength. No evidence of tongue atrophy or fasciculations Motor: Bilateral lower extremity weakness at baseline, strength is equal 3/5 Full strength in bilateral upper extremities Tone: is normal and bulk is normal Sensation- Intact to light touch bilaterally Coordination: FTN intact bilaterally Gait- deferred  NIHSS 1a Level of Conscious.: 0 1b LOC Questions: 0 1c LOC Commands: 0 2 Best Gaze: 0 3 Visual: 0 4 Facial Palsy: 0 5a Motor Arm - left: 0 5b Motor Arm - Right: 0 6a Motor Leg - Left: 2 6b Motor Leg - Right: 2 7 Limb Ataxia: 0 8 Sensory: 0 9 Best Language: 1 10 Dysarthria: 0 11 Extinct. and Inatten.: 0 TOTAL:  5   Labs I have reviewed labs in epic and the results pertinent to this consultation are:   CBC    Component Value Date/Time   WBC 9.9 10/24/2022 1202   WBC 13.2 (H) 05/17/2020 1544   RBC 4.88 10/24/2022 1202   RBC 4.83 05/17/2020 1544   HGB 13.0 10/24/2022 1202   HCT 41.6 10/24/2022 1202   PLT 303 10/24/2022 1202   MCV 85 10/24/2022 1202   MCH 26.6 10/24/2022 1202   MCH 26.9 05/17/2020 1544   MCHC 31.3 (L) 10/24/2022 1202   MCHC 31.9 05/17/2020 1544   RDW 15.1 10/24/2022 1202   LYMPHSABS 2.4 10/24/2022 1202   MONOABS 1.0 05/17/2020 1544  EOSABS 0.3 10/24/2022 1202   BASOSABS 0.1 10/24/2022 1202    CMP     Component Value Date/Time   NA 147 (H) 10/24/2022 1202   K 4.3 10/24/2022 1202   CL 107 (H) 10/24/2022 1202   CO2 26 10/24/2022 1202   GLUCOSE 141 (H) 10/24/2022 1202   GLUCOSE 173 (H) 05/17/2020 1554   BUN 9 10/24/2022 1202   CREATININE 0.98 10/24/2022 1202   CALCIUM 9.2 10/24/2022 1202   PROT 6.9 05/17/2020 1544   ALBUMIN 3.7 05/17/2020 1544   AST 28 05/17/2020 1544   ALT 17 05/17/2020 1544   ALKPHOS 99 05/17/2020 1544   BILITOT 0.7 05/17/2020 1544   GFRNONAA >60 05/17/2020 1544   GFRAA >60 01/12/2019 0007    Lipid Panel     Component Value Date/Time   CHOL 226 (H) 11/16/2018 0315   TRIG 124 11/16/2018 0315   HDL 44 11/16/2018 0315   CHOLHDL 5.1 11/16/2018 0315   VLDL 25 11/16/2018 0315   LDLCALC 157 (H) 11/16/2018 0315     Imaging I have reviewed the images obtained:  CT-head 1. No acute intracranial pathology. Small-vessel white matter disease. 2. New encephalomalacia of the right parietal vertex, consistent with interval, although nonacute infarction.  CT Angio Head and Neck  1. Image quality in the neck is degraded by motion artifact as well as streak artifact from venous contamination. Specifically, the left common carotid artery and carotid bifurcation are not well evaluated. The imaged left internal carotid artery in the neck is  widely patent. 2. Patent right carotid system without hemodynamically significant stenosis or occlusion. Patent vertebral arteries with mild stenosis at the origin on the right. 3. Calcified plaque in the intracranial ICAs resulting in moderate to severe stenosis bilaterally, and short-segment moderate stenosis of the left P1/P2 segment.  MRI examination of the brain 1. Intermittently motion degraded examination. 2. No evidence of an acute intracranial abnormality. Specifically, the diffusion-weighted imaging is of good quality and there is no evidence of an acute infarct. 3. Small chronic cortical/subcortical infarcts within the bilateral parietal and left occipital lobes, new from the prior brain MRI of 05/18/2020. 4. Chronic lacunar infarct within the left thalamus, new from the prior MRI. 5. Background mild chronic small vessel ischemic changes within the cerebral white matter and pons. 6. Redemonstrated tiny chronic infarct within the left cerebellar hemisphere. 7. Mild-to-moderate generalized cerebral atrophy. 8. 6 mm soft tissue lesion arising from the right pterional scalp. Direct examination recommended.  Assessment:  77 y.o. female presenting with previous stroke, carotid stenosis, CHF, COPD, DM2, HTN, HLD presenting with after a fall in her bathroom. She was in her bathroom when she fell backwards and hit her head on the tile floor. Her husband called EMS. On EMS arrival, the patient was able to assist them to get her into a sitting and then standing position. She requested to sit on a stool and at that time she slumped over and had a right gaze at 922. EMS activated a code stroke. En route her symptoms fully resolved. Code stroke was cancelled. Level 2 trauma for fall on thinners activated. Etiology of sx favored to be syncope.  Recommendations: - Orthostatic vital signs  - TTE - Telemetry monitoring - PT consult, OT consult, Speech consult - Syncope workup above per primary  team; neurology to be available prn for questions  Continue home plavix and atorvastatin 80mg  daily given hx stroke.   Patient seen and examined by NP/APP with MD.MD to update  note as needed.   Elmer Picker, DNP, FNP-BC [facility] Pager: [phone]   Attending Neurohospitalist Addendum Patient seen and examined with APP/Resident. Agree with the history and physical as documented above. Agree with the plan as documented, which I helped formulate. I have edited the note above to reflect my full findings and recommendations. I have independently reviewed the chart, obtained history, review of systems and examined the patient.I have personally reviewed pertinent head/neck/spine imaging (CT/MRI). Please feel free to call with any questions.  -- Bing Neighbors, MD [facility] [phone]  If 7pm- 7am, please page neurology on call as listed in AMION.

## 2022-11-07 NOTE — Assessment & Plan Note (Signed)
Fell on the commode. - Scheduled acetaminophen - Oxycodone PRN - IS - PT eval

## 2022-11-07 NOTE — Code Documentation (Signed)
Stroke Response Nurse Documentation Code Documentation  [name] is a 77 y.o. female arriving to [facility]  via [facility] on 11/07/2022 with past medical hx of hypertension, diabetes, hyperlipidemia, CHF, stroke, COPD, and multiple strokes. On clopidogrel 75 mg daily. Code stroke was activated by EMS.   Patient from home where she was LKW at [phone]. Fell in bathroom and hit head. Husband called EMS. Upon arrival, EMS witnessed her slump over and noted a gaze preference. Symptoms resolved en route. Code stroke canceled prior to arrival.   NIHSS 5, see documentation for details and code stroke times. Patient with bilateral leg weakness and Expressive aphasia  on exam. The following imaging was completed:  CT Head and CTA. Patient is not a candidate for IV Thrombolytic due to fall on plavix. Patient is not a candidate for IR due to no LVO.   Care Plan: Q2 assessments x12 then Q4. MRI and work up for syncope with trauma assistance as level 2 fall on plavix.   Bedside handoff with ED RN.    Ferman Hamming Stroke Response RN

## 2022-11-07 NOTE — Assessment & Plan Note (Signed)
Continue Lipitor and Zetia 

## 2022-11-07 NOTE — ED Triage Notes (Signed)
BIB by EMS. Pt was in bathroom and fell on tile floor and hit her head. Patient is on thinners. Pt was having a right sided gaze and it was called as code stroke, and it was canceled due to her symptoms resolving. Pt was taken to CT due to fall. Pt is complaining of right side pain.

## 2022-11-08 ENCOUNTER — Observation Stay (HOSPITAL_COMMUNITY): Payer: Medicare Other

## 2022-11-08 ENCOUNTER — Encounter (HOSPITAL_COMMUNITY)
Admission: EM | Disposition: A | Discharge status: Skilled Nursing Facility | Payer: Self-pay | Source: Home / Self Care | Attending: Internal Medicine

## 2022-11-08 DIAGNOSIS — S2241XA Multiple fractures of ribs, right side, initial encounter for closed fracture: Secondary | ICD-10-CM | POA: Diagnosis present

## 2022-11-08 DIAGNOSIS — R531 Weakness: Secondary | ICD-10-CM | POA: Diagnosis not present

## 2022-11-08 DIAGNOSIS — I5032 Chronic diastolic (congestive) heart failure: Secondary | ICD-10-CM | POA: Diagnosis present

## 2022-11-08 DIAGNOSIS — R296 Repeated falls: Secondary | ICD-10-CM | POA: Diagnosis not present

## 2022-11-08 DIAGNOSIS — M6281 Muscle weakness (generalized): Secondary | ICD-10-CM | POA: Diagnosis not present

## 2022-11-08 DIAGNOSIS — W1830XA Fall on same level, unspecified, initial encounter: Secondary | ICD-10-CM | POA: Diagnosis present

## 2022-11-08 DIAGNOSIS — Y92012 Bathroom of single-family (private) house as the place of occurrence of the external cause: Secondary | ICD-10-CM | POA: Diagnosis not present

## 2022-11-08 DIAGNOSIS — Z888 Allergy status to other drugs, medicaments and biological substances status: Secondary | ICD-10-CM | POA: Diagnosis not present

## 2022-11-08 DIAGNOSIS — J42 Unspecified chronic bronchitis: Secondary | ICD-10-CM | POA: Diagnosis not present

## 2022-11-08 DIAGNOSIS — E1165 Type 2 diabetes mellitus with hyperglycemia: Secondary | ICD-10-CM | POA: Diagnosis not present

## 2022-11-08 DIAGNOSIS — I959 Hypotension, unspecified: Secondary | ICD-10-CM | POA: Diagnosis present

## 2022-11-08 DIAGNOSIS — R2681 Unsteadiness on feet: Secondary | ICD-10-CM | POA: Diagnosis not present

## 2022-11-08 DIAGNOSIS — I2510 Atherosclerotic heart disease of native coronary artery without angina pectoris: Secondary | ICD-10-CM | POA: Diagnosis present

## 2022-11-08 DIAGNOSIS — Z7902 Long term (current) use of antithrombotics/antiplatelets: Secondary | ICD-10-CM | POA: Diagnosis not present

## 2022-11-08 DIAGNOSIS — S2249XD Multiple fractures of ribs, unspecified side, subsequent encounter for fracture with routine healing: Secondary | ICD-10-CM | POA: Diagnosis not present

## 2022-11-08 DIAGNOSIS — R569 Unspecified convulsions: Secondary | ICD-10-CM

## 2022-11-08 DIAGNOSIS — M4696 Unspecified inflammatory spondylopathy, lumbar region: Secondary | ICD-10-CM | POA: Diagnosis not present

## 2022-11-08 DIAGNOSIS — Z66 Do not resuscitate: Secondary | ICD-10-CM | POA: Diagnosis present

## 2022-11-08 DIAGNOSIS — E785 Hyperlipidemia, unspecified: Secondary | ICD-10-CM | POA: Diagnosis present

## 2022-11-08 DIAGNOSIS — M810 Age-related osteoporosis without current pathological fracture: Secondary | ICD-10-CM | POA: Diagnosis present

## 2022-11-08 DIAGNOSIS — Z794 Long term (current) use of insulin: Secondary | ICD-10-CM | POA: Diagnosis not present

## 2022-11-08 DIAGNOSIS — R41841 Cognitive communication deficit: Secondary | ICD-10-CM | POA: Diagnosis not present

## 2022-11-08 DIAGNOSIS — I110 Hypertensive heart disease with heart failure: Secondary | ICD-10-CM | POA: Diagnosis present

## 2022-11-08 DIAGNOSIS — Z8673 Personal history of transient ischemic attack (TIA), and cerebral infarction without residual deficits: Secondary | ICD-10-CM | POA: Diagnosis not present

## 2022-11-08 DIAGNOSIS — Z8042 Family history of malignant neoplasm of prostate: Secondary | ICD-10-CM | POA: Diagnosis not present

## 2022-11-08 DIAGNOSIS — R55 Syncope and collapse: Secondary | ICD-10-CM

## 2022-11-08 DIAGNOSIS — E1142 Type 2 diabetes mellitus with diabetic polyneuropathy: Secondary | ICD-10-CM | POA: Diagnosis present

## 2022-11-08 DIAGNOSIS — E11649 Type 2 diabetes mellitus with hypoglycemia without coma: Secondary | ICD-10-CM | POA: Diagnosis present

## 2022-11-08 DIAGNOSIS — Z79899 Other long term (current) drug therapy: Secondary | ICD-10-CM | POA: Diagnosis not present

## 2022-11-08 DIAGNOSIS — Z90710 Acquired absence of both cervix and uterus: Secondary | ICD-10-CM | POA: Diagnosis not present

## 2022-11-08 DIAGNOSIS — Z741 Need for assistance with personal care: Secondary | ICD-10-CM | POA: Diagnosis not present

## 2022-11-08 DIAGNOSIS — J449 Chronic obstructive pulmonary disease, unspecified: Secondary | ICD-10-CM | POA: Diagnosis present

## 2022-11-08 DIAGNOSIS — Z8249 Family history of ischemic heart disease and other diseases of the circulatory system: Secondary | ICD-10-CM | POA: Diagnosis not present

## 2022-11-08 DIAGNOSIS — Z7401 Bed confinement status: Secondary | ICD-10-CM | POA: Diagnosis not present

## 2022-11-08 DIAGNOSIS — Z9049 Acquired absence of other specified parts of digestive tract: Secondary | ICD-10-CM | POA: Diagnosis not present

## 2022-11-08 DIAGNOSIS — Z833 Family history of diabetes mellitus: Secondary | ICD-10-CM | POA: Diagnosis not present

## 2022-11-08 DIAGNOSIS — Z7984 Long term (current) use of oral hypoglycemic drugs: Secondary | ICD-10-CM | POA: Diagnosis not present

## 2022-11-08 DIAGNOSIS — E119 Type 2 diabetes mellitus without complications: Secondary | ICD-10-CM | POA: Diagnosis not present

## 2022-11-08 DIAGNOSIS — Z811 Family history of alcohol abuse and dependence: Secondary | ICD-10-CM | POA: Diagnosis not present

## 2022-11-08 HISTORY — PX: LOOP RECORDER INSERTION: EP1214

## 2022-11-08 LAB — GLUCOSE, CAPILLARY
Glucose-Capillary: 67 mg/dL — ABNORMAL LOW (ref 70–99)
Glucose-Capillary: 71 mg/dL (ref 70–99)
Glucose-Capillary: 82 mg/dL (ref 70–99)
Glucose-Capillary: 108 mg/dL — ABNORMAL HIGH (ref 70–99)
Glucose-Capillary: 101 mg/dL — ABNORMAL HIGH (ref 70–99)
Glucose-Capillary: 115 mg/dL — ABNORMAL HIGH (ref 70–99)
Glucose-Capillary: 151 mg/dL — ABNORMAL HIGH (ref 70–99)

## 2022-11-08 LAB — BASIC METABOLIC PANEL
Anion gap: 7 (ref 5–15)
BUN: 11 mg/dL (ref 8–23)
CO2: 31 mmol/L (ref 22–32)
Calcium: 8.7 mg/dL — ABNORMAL LOW (ref 8.9–10.3)
Chloride: 99 mmol/L (ref 98–111)
Creatinine, Ser: 0.98 mg/dL (ref 0.44–1.00)
GFR, Estimated: 60 mL/min — ABNORMAL LOW (ref >60)
Glucose, Bld: 55 mg/dL — ABNORMAL LOW (ref 70–99)
Potassium: 3.6 mmol/L (ref 3.5–5.1)
Sodium: 137 mmol/L (ref 135–145)

## 2022-11-08 LAB — ECHOCARDIOGRAM COMPLETE
Area-P 1/2: 2.95 cm2
MV VTI: 1.07 cm2
S' Lateral: 3 cm

## 2022-11-08 LAB — HEMOGLOBIN A1C
Hgb A1c MFr Bld: 8.2 % — ABNORMAL HIGH (ref 4.8–5.6)
Mean Plasma Glucose: 188.64 mg/dL

## 2022-11-08 LAB — CBC
HCT: 41.5 % (ref 36.0–46.0)
Hemoglobin: 13.2 g/dL (ref 12.0–15.0)
MCH: 26.9 pg (ref 26.0–34.0)
MCHC: 31.8 g/dL (ref 30.0–36.0)
MCV: 84.5 fL (ref 80.0–100.0)
Platelets: 288 10*3/uL (ref 150–400)
RBC: 4.91 MIL/uL (ref 3.87–5.11)
RDW: 15.6 % — ABNORMAL HIGH (ref 11.5–15.5)
WBC: 11.2 10*3/uL — ABNORMAL HIGH (ref 4.0–10.5)
nRBC: 0 % (ref 0.0–0.2)

## 2022-11-08 LAB — MAGNESIUM: Magnesium: 2.2 mg/dL (ref 1.7–2.4)

## 2022-11-08 LAB — TSH: TSH: 1.658 u[IU]/mL (ref 0.350–4.500)

## 2022-11-08 SURGERY — LOOP RECORDER INSERTION

## 2022-11-08 SURGERY — LEFT HEART CATH AND CORONARY ANGIOGRAPHY
Anesthesia: LOCAL

## 2022-11-08 MED ORDER — HYDRALAZINE HCL 20 MG/ML IJ SOLN: 10.0000 mg | Freq: Four times a day (QID) | INTRAMUSCULAR | Status: DC | PRN

## 2022-11-08 MED ORDER — LACTATED RINGERS IV SOLN: INTRAVENOUS | Status: DC

## 2022-11-08 MED ORDER — DEXTROSE 50 % IV SOLN
INTRAVENOUS | Status: AC
  Filled 2022-11-08: qty 50

## 2022-11-08 MED ORDER — INSULIN GLARGINE-YFGN 100 UNIT/ML ~~LOC~~ SOLN
20.0000 [IU] | Freq: Every day | SUBCUTANEOUS | Status: DC
  Filled 2022-11-08: qty 0.2

## 2022-11-08 MED ORDER — LACTATED RINGERS IV SOLN: INTRAVENOUS | Status: AC

## 2022-11-08 MED ORDER — LIDOCAINE-EPINEPHRINE 1 %-1:100000 IJ SOLN
INTRAMUSCULAR | Status: AC
  Filled 2022-11-08: qty 1

## 2022-11-08 MED ORDER — DEXTROSE 50 % IV SOLN
25.0000 mL | Freq: Once | INTRAVENOUS | Status: AC
  Administered 2022-11-08: 25 mL via INTRAVENOUS

## 2022-11-08 MED ORDER — LIDOCAINE-EPINEPHRINE 1 %-1:100000 IJ SOLN
INTRAMUSCULAR | Status: DC | PRN
  Administered 2022-11-08: 20 mL

## 2022-11-08 MED ORDER — POTASSIUM CHLORIDE CRYS ER 20 MEQ PO TBCR
40.0000 meq | EXTENDED_RELEASE_TABLET | Freq: Once | ORAL | Status: AC
  Administered 2022-11-08: 40 meq via ORAL
  Filled 2022-11-08: qty 2

## 2022-11-08 MED ORDER — INSULIN ASPART 100 UNIT/ML IJ SOLN
0.0000 [IU] | Freq: Three times a day (TID) | INTRAMUSCULAR | Status: DC
  Administered 2022-11-09: 5 [IU] via SUBCUTANEOUS
  Administered 2022-11-10: 2 [IU] via SUBCUTANEOUS

## 2022-11-08 SURGICAL SUPPLY — 2 items
PACK LOOP INSERTION (CUSTOM PROCEDURE TRAY) ×2 IMPLANT
SYSTEM MONITOR REVEAL LINQ II (Prosthesis & Implant Heart) IMPLANT

## 2022-11-08 NOTE — TOC Initial Note (Signed)
Transition of Care [facility]) - Initial/Assessment Note    Patient Details  Name: [name] MRN: [id number] Date of Birth: December 09, 1945  Transition [facility]) CM/SW Contact:    Gordy Clement, RN Phone Number: 11/08/2022, 1:12 PM  Clinical Narrative:      CM met with patient at bedside.  Son was in room and patient's spouse in bathroom. Patient came from home where she lives with her Husband. Patient states she was receiving Home Health Services from [facility]. [facility] is a rehabilitation center in [place] and when CM revisited, Patient stated she had stayed at [facility] previously.  [facility] was current with Patient for PT/OT SN. Liaison notified.   Patient stated that she would like to go to SNF after DC for a short while in order to get stronger  She stated she did not feel strong enough to go home. PT/OT/ CSW  notified as an FYI .   TOC will continue to follow patient for any additional discharge needs. Final Recs to be determined       Expected Discharge Plan: Skilled Nursing Facility Barriers to Discharge: Continued Medical Work up   Patient Goals and CMS Choice Patient states their goals for this hospitalization and ongoing recovery are:: To go to rehab SNF before going home to get stronger CMS Medicare.gov Compare Post Acute Care list provided to:: Other (Comment Required) (dispo TBD) Choice offered to / list presented to : NA      Expected Discharge Plan and Services   Discharge Planning Services: NA Post Acute Care Choice: Skilled Nursing Facility (Patient desires SNF) Living arrangements for the past 2 months: Single Family Home                 DME Arranged: N/A (Already has a cane, RW , BSC, shower chair and wheelchair) DME Agency: [employer]       HH Arranged: NA HH Agency: NA        Prior Living Arrangements/Services Living arrangements for the past 2 months: Single Family Home Lives with:: Spouse Patient language and need for interpreter  reviewed:: Yes Do you feel safe going back to the place where you live?: No   wants to feel stronger before returning home  Need for Family Participation in Patient Care: Yes (Comment) Care giver support system in place?: Yes (comment) Current home services: Home OT, Home PT, Home RN ([facility]) Criminal Activity/Legal Involvement Pertinent to Current Situation/Hospitalization: No - Comment as needed  Activities of Daily Living      Permission Sought/Granted Permission sought to share information with : Family Supports Permission granted to share information with : Yes, Verbal Permission Granted        Permission granted to share info w Relationship: Son     Emotional Assessment Appearance:: Appears stated age Attitude/Demeanor/Rapport: Gracious Affect (typically observed): Quiet, Anxious Orientation: : Oriented to Self, Oriented to Place, Oriented to  Time, Oriented to Situation Alcohol / Substance Use: Not Applicable Psych Involvement: No (comment)  Admission diagnosis:  Syncope [R55] Closed fracture of multiple ribs of right side, initial encounter [S22.41XA] Recurrent strokes (HCC) [I63.9] Patient Active Problem List   Diagnosis Date Noted   Syncope 11/07/2022   Right 7-9th rib fractures 11/07/2022   Chronic venous insufficiency 09/27/2021   Overgrown toenails 12/16/2020   Onychogryposis of toenail 09/07/2020   Cerebrovascular disease    Spondylosis    Osteoporosis    Lumbar disc narrowing    Living in nursing home  Hypertension    Diabetes mellitus without complication (HCC)    CHF (congestive heart failure) (HCC)    Acute pain of left knee    Fall 11/21/2018   Carotid stenosis, right 11/18/2018   Uncontrolled type 2 diabetes mellitus with hypoglycemia, with long-term current use of insulin (HCC) 11/18/2018   Family hx-stroke 11/18/2018   Cerebral infarction due to stenosis of right carotid artery (HCC) s/p tPA 11/15/2018   Pustules determined by  examination 04/11/2018   Hyperlipidemia 01/14/2018   COPD (chronic obstructive pulmonary disease) (HCC) 01/14/2018   Angina pectoris (HCC) 07/28/2016   Chronic diastolic congestive heart failure (HCC) 07/28/2016   Hypertensive heart disease with heart failure (HCC) 07/28/2016   Diabetic polyneuropathy associated with type 2 diabetes mellitus (HCC) 11/30/2015   Pain due to onychomycosis of toenail of right foot 08/17/2015   PCP:  Paulina Fusi, MD Pharmacy:   [address] - [place], [place] - [address] [place] [address] Phone: [phone] Fax: [phone]  [facility] - [place], [place] - [address] [place] [address] Phone: [phone] Fax: [phone]     Social Determinants of Health (SDOH) Social History: SDOH Screenings   Food Insecurity: No Food Insecurity (11/08/2022)  Housing: Low Risk  (11/08/2022)  Transportation Needs: No Transportation Needs (11/08/2022)  Utilities: Not At Risk (11/08/2022)  Alcohol Screen: Low Risk  (12/25/2018)  Depression (PHQ2-9): Low Risk  (12/25/2018)  Financial Resource Strain: Low Risk  (12/20/2018)  Physical Activity: Inactive (12/20/2018)  Social Connections: Moderately Integrated (12/25/2018)  Stress: Stress Concern Present (12/20/2018)  Tobacco Use: Low Risk  (11/07/2022)   SDOH Interventions:     Readmission Risk Interventions     No data to display

## 2022-11-08 NOTE — Evaluation (Signed)
Occupational Therapy Evaluation Patient Details Name: [name] MRN: [id number] DOB: 02-Mar-1946 Today's Date: 11/08/2022   History of Present Illness Patient is a 77 year old female with syncope with fall striking her ribs on the commode. EMS noted her slumped over with gaze preference. Found to have 7-9th right rib fractures. History of syncopal episodes, stroke, COPD, HTN, CHF.   Clinical Impression   At baseline, pt completes ADLs Independent to Mod I with use of adaptive equipment and functional transfers/mobility Mod I using a RW (2 wheel) for household distances and wheelchair for longer distances. Pt now presents with decreased activity tolerance, decreased balance during functional tasks, generalized B UE weakness, increased pain in R side with B UE AROM and activity, and decreased safety and independence with ADLs and functional transfers/mobility. Pt currently demonstrates ability to complete UB ADLs with Mod I to Min assist, LB ADLs with Mod assist, and functional transfers with a RW with close Supervision to Min guard assist. Pt reported no dizziness, lightheadedness, or nausea throughout session with no episodes of orthostatic hypotension noted. Pt will benefit from acute skilled OT services to address deficits outlined below and to increase safety and independence with ADLs, functional transfers, and functional mobility. Post acute discharge, pt will benefit from continued skilled OT services in the home to maximize rehab potential.      Recommendations for follow up therapy are one component of a multi-disciplinary discharge planning process, led by the attending physician.  Recommendations may be updated based on patient status, additional functional criteria and insurance authorization.   Assistance Recommended at Discharge Frequent or constant Supervision/Assistance  Patient can return home with the following A little help with walking and/or transfers;A lot of help with  bathing/dressing/bathroom;Assistance with cooking/housework;Direct supervision/assist for financial management;Direct supervision/assist for medications management;Assist for transportation;Help with stairs or ramp for entrance    Functional Status Assessment  Patient has had a recent decline in their functional status and demonstrates the ability to make significant improvements in function in a reasonable and predictable amount of time.  Equipment Recommendations  None recommended by OT    Recommendations for Other Services       Precautions / Restrictions Precautions Precautions: Fall Precaution Comments: monitor BP Restrictions Weight Bearing Restrictions: No      Mobility Bed Mobility Overal bed mobility: Needs Assistance Bed Mobility: Supine to Sit, Sit to Supine     Supine to sit: Min guard, HOB elevated Sit to supine: Min guard, HOB elevated   General bed mobility comments: increased time and effort required    Transfers Overall transfer level: Needs assistance Equipment used: Rolling walker (2 wheels) Transfers: Sit to/from Stand, Bed to chair/wheelchair/BSC Sit to Stand: Min assist          Lateral/Scoot Transfers: Supervision General transfer comment: Patient able to stand x1 attempt with steadying assistance required. Pt requires cues for hand placement and for safety. Pt demonstrated ability to performed incremental scooting transfer along edge of bed x4 with supervision. Pt reports no dizziness in standing. However patient reports feeling "weak" with activity.      Balance Overall balance assessment: Needs assistance Sitting-balance support: Feet supported Sitting balance-Leahy Scale: Fair Sitting balance - Comments: no loss of balance while seated. close stand by assistance for safety   Standing balance support: Bilateral upper extremity supported Standing balance-Leahy Scale: Fair Standing balance comment: no gross loss of balance while using  rolling walker for UE support in standing  ADL either performed or assessed with clinical judgement   ADL Overall ADL's : Needs assistance/impaired Eating/Feeding: Modified independent;Sitting   Grooming: Minimal assistance;Sitting Grooming Details (indicate cue type and reason): Set up for washing face/hands and oral hygiene, Min assist for brushing hair. Pt may benefit from a long handled comb or brush. Upper Body Bathing: Minimal assistance;Sitting;Cueing for compensatory techniques Upper Body Bathing Details (indicate cue type and reason): Pt reports use of AE for bathing at home. Lower Body Bathing: Moderate assistance;Cueing for compensatory techniques;Cueing for safety;Sit to/from stand Lower Body Bathing Details (indicate cue type and reason): Pt reports use of AE for bathing at home. Upper Body Dressing : Minimal assistance;Sitting;Cueing for compensatory techniques   Lower Body Dressing: Moderate assistance;Cueing for compensatory techniques;Sit to/from stand Lower Body Dressing Details (indicate cue type and reason): Pt reports use of AE for LB dressing at home. Toilet Transfer: Supervision/safety;Min guard;Cueing for safety;Cueing for sequencing;Stand-pivot;BSC/3in1 Statistician Details (indicate cue type and reason): Close Supervision to [employer] guard assist Toileting- Clothing Manipulation and Hygiene: Moderate assistance;Sit to/from stand;Cueing for safety;Cueing for compensatory techniques         General ADL Comments: Functional mobility deferred this session for pt/therapist safety due to pt reporting feeling generalized weakness after standing for 3 minutes to take orthostatics. Pt reports she was previously trained in use of AE for bathing and LB dressing and typically uses a reacher and sock aide.     Vision Baseline Vision/History: 1 Wears glasses (readers) Ability to See in Adequate Light: 0 Adequate Patient Visual Report: No  change from baseline       Perception     Praxis      Pertinent Vitals/Pain Pain Assessment Pain Assessment: 0-10 Pain Score: 7  Pain Location: R side Pain Descriptors / Indicators: Sharp, Discomfort, Guarding, Grimacing Pain Intervention(s): Other (comment), Limited activity within patient's tolerance, Monitored during session, Repositioned (encouraged bracing right flank with mobility/coughing for comfort)     Hand Dominance Right   Extremity/Trunk Assessment Upper Extremity Assessment Upper Extremity Assessment: Generalized weakness;RUE deficits/detail;LUE deficits/detail RUE Deficits / Details: generalized weakness, AROM shoulder flexion to approximately 90 degrees, pain in R side with AROM LUE Deficits / Details: generalized weakness, AROM shoulder flexion to approximately 90 degrees, pain in R side with AROM   Lower Extremity Assessment Lower Extremity Assessment: Defer to PT evaluation       Communication Communication Communication: No difficulties   Cognition Arousal/Alertness: Awake/alert Behavior During Therapy: WFL for tasks assessed/performed Overall Cognitive Status: No family/caregiver present to determine baseline cognitive functioning                                 General Comments: Patient is oriented x 4, able to follow single step commands with increased time. Pt presents with impaired safety awareness, impaired short term memory, and impaired sequencing ability.     General Comments  Pt reports no dizziness, lightheadness, or nausea this session. Pt VS largely stable on RA throughout session. Orthostatics taken during session. Pt BP in lying 127/75, 148/95 in sitting, 130/72 in standing at 0 minutes, and 136/69 in standing at 3 minutes.    Exercises     Shoulder Instructions      Home Living Family/patient expects to be discharged to:: Private residence Living Arrangements: Spouse/significant other Available Help at Discharge:  Family (husband, sister) Type of Home: House Home Access: Ramped entrance     Home Layout: One level  Bathroom Shower/Tub: Producer, television/film/video: Standard     Home Equipment: Agricultural consultant (2 wheels);Wheelchair - Manufacturing systems engineer;Other (comment) (grabber, sock aid)   Additional Comments: reports having home health PT and nursing at home      Prior Functioning/Environment Prior Level of Function : Independent/Modified Independent             Mobility Comments: uses RW for household distances and wheelchair for longer distances ADLs Comments: Mod I with AE        OT Problem List: Decreased strength;Decreased activity tolerance;Impaired balance (sitting and/or standing);Decreased safety awareness      OT Treatment/Interventions: Self-care/ADL training;Therapeutic exercise;DME and/or AE instruction;Therapeutic activities;Patient/family education;Balance training    OT Goals(Current goals can be found in the care plan section) Acute Rehab OT Goals Patient Stated Goal: To know why she is "blacking out" and to return home OT Goal Formulation: With patient Time For Goal Achievement: 11/22/22 Potential to Achieve Goals: Good ADL Goals Pt Will Perform Grooming: with modified independence;standing (with use of adaptive equipment as needed) Pt Will Perform Lower Body Bathing: with modified independence;sit to/from stand;with adaptive equipment Pt Will Perform Lower Body Dressing: with modified independence;sit to/from stand;with adaptive equipment Pt Will Transfer to Toilet: with modified independence;regular height toilet;ambulating (with least restrictive AD) Pt Will Perform Toileting - Clothing Manipulation and hygiene: with modified independence;sit to/from stand (with adaptive equipment as needed)  OT Frequency: Min 2X/week    Co-evaluation PT/OT/SLP Co-Evaluation/Treatment: Yes Reason for Co-Treatment: For patient/therapist safety;Complexity of the  patient's impairments (multi-system involvement) PT goals addressed during session: Mobility/safety with mobility OT goals addressed during session: ADL's and self-care      AM-PAC OT "6 Clicks" Daily Activity     Outcome Measure Help from another person eating meals?: None Help from another person taking care of personal grooming?: A Little Help from another person toileting, which includes using toliet, bedpan, or urinal?: A Lot Help from another person bathing (including washing, rinsing, drying)?: A Lot Help from another person to put on and taking off regular upper body clothing?: A Little Help from another person to put on and taking off regular lower body clothing?: A Lot 6 Click Score: 16   End of Session Equipment Utilized During Treatment: Rolling walker (2 wheels) Nurse Communication: Mobility status;Other (comment) (Orthos, VSS throughout session)  Activity Tolerance: Patient tolerated treatment well Patient left: in bed;with bed alarm set;with call bell/phone within reach  OT Visit Diagnosis: Unsteadiness on feet (R26.81);Other abnormalities of gait and mobility (R26.89);Repeated falls (R29.6);History of falling (Z91.81);Muscle weakness (generalized) (M62.81)                Time: 4098-1191 OT Time Calculation (min): 25 min Charges:  OT General Charges $OT Visit: 1 Visit OT Evaluation $OT Eval Moderate Complexity: 1 Mod  [address] Tower StreetMolson Coors Brewing., OTR/L, MA Acute Rehab [phone]   Lendon Colonel 11/08/2022, 12:17 PM

## 2022-11-08 NOTE — Discharge Instructions (Addendum)
Care After Your Loop Recorder  You have a Medtronic Loop Recorder   Monitor your cardiac device site for redness, swelling, and drainage. Call the device clinic at [phone] if you experience these symptoms or fever/chills.  If you notice bleeding from your site, hold firm, but gently pressure with two fingers for 5 minutes. Dried blood on the steri-strips when removing the outer bandage is normal.   Keep the large square bandage on your site for 24 hours and then you may remove it yourself. Keep the steri-strips underneath in place.   You may shower after 72 hours / 3 days from your procedure with the steri-strips in place. They will usually fall off on their own, or may be removed after 10 days. Pat dry.   Avoid lotions, ointments, or perfumes over your incision until it is well-healed.  Please do not submerge in water until your site is completely healed.   Your device is MRI compatible.   Remote monitoring is used to monitor your cardiac device from home. This monitoring is scheduled every month by our office. It allows [place] to keep an eye on the function of your device to ensure it is working properly.     Follow with Primary MD Paulina Fusi, MD in 7 days   Get CBC, CMP, 2 view Chest X ray, Magnesium -  checked next visit with your primary MD or SNF MD    Activity: As tolerated with Full fall precautions use walker/cane & assistance as needed  Disposition SNF  Diet: Heart Healthy low carbohydrate diet, check CBGs q. ACH S and adjust insulin further at SNF as needed.  Special Instructions: If you have smoked or chewed Tobacco  in the last 2 yrs please stop smoking, stop any regular Alcohol  and or any Recreational drug use.  On your next visit with your primary care physician please Get Medicines reviewed and adjusted.  Please request your Prim.MD to go over all Hospital Tests and Procedure/Radiological results at the follow up, please get all Hospital records sent to  your Prim MD by signing hospital release before you go home.  If you experience worsening of your admission symptoms, develop shortness of breath, life threatening emergency, suicidal or homicidal thoughts you must seek medical attention immediately by calling 911 or calling your MD immediately  if symptoms less severe.  You Must read complete instructions/literature along with all the possible adverse reactions/side effects for all the Medicines you take and that have been prescribed to you. Take any new Medicines after you have completely understood and accpet all the possible adverse reactions/side effects.

## 2022-11-08 NOTE — Procedures (Signed)
Patient Name: ILAH LIEBELT  MRN: [id number]  Epilepsy Attending: Charlsie Quest  Referring Physician/Provider: Leroy Sea, MD  Date: 11/08/2022 Duration: 22.46 mins  Patient history: 77yo F with syncope. EEG to evaluate for seizure.  Level of alertness: Awake  AEDs during EEG study: GBP  Technical aspects: This EEG study was done with scalp electrodes positioned according to the 10-20 International system of electrode placement. Electrical activity was reviewed with band pass filter of 1-70Hz , sensitivity of 7 uV/mm, display speed of 7mm/sec with a 60Hz  notched filter applied as appropriate. EEG data were recorded continuously and digitally stored.  Video monitoring was available and reviewed as appropriate.  Description: The posterior dominant rhythm consists of 6 Hz activity of moderate voltage (25-35 uV) seen predominantly in posterior head regions, symmetric and reactive to eye opening and eye closing. EEG showed continuous generalized 5 to 7 Hz theta slowing. Hyperventilation and photic stimulation were not performed.     ABNORMALITY - Continuous slow, generalized - Background slow  IMPRESSION: This study is suggestive of mild to moderate diffuse encephalopathy, nonspecific etiology. No seizures or epileptiform discharges were seen throughout the recording.  Jullien Granquist Annabelle Harman

## 2022-11-08 NOTE — Evaluation (Signed)
Clinical/Bedside Swallow Evaluation Patient Details  Name: [name] MRN: [id number] Date of Birth: May 02, 1946  Today's Date: 11/08/2022 Time: SLP Start Time (ACUTE ONLY): 1147 SLP Stop Time (ACUTE ONLY): 1200 SLP Time Calculation (min) (ACUTE ONLY): 13 min  Past Medical History:  Past Medical History:  Diagnosis Date   Acute pain of left knee    Angina pectoris (HCC) 07/28/2016   With normal coronary arteriography   Carotid stenosis, right 11/18/2018   Cerebral infarction due to stenosis of right carotid artery (HCC) s/p tPA 11/15/2018   CHF (congestive heart failure) (HCC)    Chronic diastolic congestive heart failure (HCC) 07/28/2016   Chronic venous insufficiency 09/27/2021   COPD (chronic obstructive pulmonary disease) (HCC) 01/14/2018   Diabetes mellitus type II, uncontrolled 11/18/2018   Diabetes mellitus without complication (HCC)    Diabetic polyneuropathy associated with type 2 diabetes mellitus (HCC) 11/30/2015   Fall 11/21/2018   Family hx-stroke 11/18/2018   Hyperlipidemia    Hypertension    Hypertensive heart disease with heart failure (HCC) 07/28/2016   Living in nursing home    Lumbar disc narrowing    Onychogryposis of toenail 09/07/2020   Osteoporosis    Overgrown toenails 12/16/2020   Pustules determined by examination 04/11/2018   Spondylosis    lumbosacral   Stroke [facility])    Past Surgical History:  Past Surgical History:  Procedure Laterality Date   ABDOMINAL HYSTERECTOMY     APPENDECTOMY     CARDIAC CATHETERIZATION     CHOLECYSTECTOMY     ENDARTERECTOMY Right 11/22/2018   Procedure: ENDARTERECTOMY CAROTID RIGHT;  Surgeon: Cephus Shelling, MD;  Location: [facility] OR;  Service: Vascular;  Laterality: Right;   HIP SURGERY  07/16/2019   Had to repair a lot of damage   KNEE ARTHROCENTESIS  11/22/2018       PATCH ANGIOPLASTY Right 11/22/2018   Procedure: Patch Angioplasty Right Carotid Artery using Xenosure Biologic Patch;  Surgeon: Cephus Shelling, MD;  Location:  [facility] OR;  Service: Vascular;  Laterality: Right;   HPI:  Pt is a 77 y.o. female who presented with syncope and fall striking her ribs on the commode. EMS noted her slumped over with gaze preference. Pt found to have 7-9th right rib fractures. MRI brain negative for acute changes. PMH: HTN, DM, hx CVA without residual deficits, dCHF, carotid PVD, and COPD.    Assessment / Plan / Recommendation  Clinical Impression  Pt was seen for bedside swallow evaluation and she denied a history of dysphagia. Oral mechanism exam was significant for facial weakness and reduced lingual ROM. Pt was edentulous and stated that her dentures are at home. Pt exhibited prolonged mastication of regular texture solids, mild lingual residue, and delayed throat clear once with thin liquids via straw. Pt attributed her prolonged mastication to her not having her dentures, but she indicated that they will be brought today. [name] current diet of regular texture solids and thin liquids, but SLP will follow to ensure tolerance. SLP Visit Diagnosis: Dysphagia, unspecified (R13.10)    Aspiration Risk  Mild aspiration risk    Diet Recommendation Regular;Thin liquid    Liquid Administration via: Straw;Cup Medication Administration: Whole meds with liquid Supervision: Patient able to self feed Compensations: Slow rate;Small sips/bites Postural Changes: Seated upright at 90 degrees    Other  Recommendations Oral Care Recommendations: Oral care BID    Recommendations for follow up therapy are one component of a multi-disciplinary discharge planning process, led by the attending physician.  Recommendations may be updated based on patient status, additional functional criteria and insurance authorization.  Follow up Recommendations  (TBD)      Assistance Recommended at Discharge    Functional Status Assessment Patient has had a recent decline in their functional status and demonstrates the ability to make significant improvements  in function in a reasonable and predictable amount of time.  Frequency and Duration min 1 x/week  1 week       Prognosis Prognosis for improved oropharyngeal function: Good Barriers to Reach Goals: Cognitive deficits      Swallow Study   General Date of Onset: 11/07/22 HPI: Pt is a 77 y.o. female who presented with syncope and fall striking her ribs on the commode. EMS noted her slumped over with gaze preference. Pt found to have 7-9th right rib fractures. MRI brain negative for acute changes. PMH: HTN, DM, hx CVA without residual deficits, dCHF, carotid PVD, and COPD. Type of Study: Bedside Swallow Evaluation Previous Swallow Assessment: none Diet Prior to this Study: Regular;Thin liquids (Level 0) Temperature Spikes Noted: No Respiratory Status: Room air History of Recent Intubation: No Behavior/Cognition: Alert;Cooperative;Pleasant mood Oral Cavity Assessment: Within Functional Limits Oral Care Completed by SLP: No Oral Cavity - Dentition: Edentulous;Dentures, not available (at home per pt) Vision: Functional for self-feeding Self-Feeding Abilities: Able to feed self Patient Positioning: Upright in bed;Postural control adequate for testing Baseline Vocal Quality: Normal Volitional Cough: Strong Volitional Swallow: Able to elicit    Oral/Motor/Sensory Function Overall Oral Motor/Sensory Function: Within functional limits   Ice Chips Ice chips: Within functional limits Presentation: Self Fed   Thin Liquid Thin Liquid: Impaired Presentation: Straw Pharyngeal  Phase Impairments: Cough - Immediate    Nectar Thick Nectar Thick Liquid: Not tested   Honey Thick Honey Thick Liquid: Not tested   Puree Puree: Within functional limits Presentation: Spoon   Solid     Solid: Impaired Presentation: Self Fed Oral Phase Impairments: Impaired mastication Oral Phase Functional Implications: Impaired mastication;Oral residue     Starsha Morning I. Vear Clock, MS, CCC-SLP Acute Rehabilitation  Services Office number [phone]  Scheryl Marten 11/08/2022,1:14 PM

## 2022-11-08 NOTE — [place] FL2 (Signed)
[place] MEDICAID FL2 LEVEL OF CARE FORM     IDENTIFICATION  Patient Name: [name] Birthdate: Aug 12, 1945 Sex: female Admission Date (Current Location): 11/07/2022  [facility] [place] Number:  [employer] and Address:  [facility], [address] [place], [place] [address]      Provider Number: [id number]  Attending Physician Name and Address:  Leroy Sea, MD  Relative Name and Phone Number:       Current Level of Care: Hospital Recommended Level of Care: Skilled Nursing Facility Prior Approval Number:    Date Approved/Denied:   PASRR Number: [id number]  Discharge Plan: SNF    Current Diagnoses: Patient Active Problem List   Diagnosis Date Noted   Syncope 11/07/2022   Right 7-9th rib fractures 11/07/2022   Chronic venous insufficiency 09/27/2021   Overgrown toenails 12/16/2020   Onychogryposis of toenail 09/07/2020   Cerebrovascular disease    Spondylosis    Osteoporosis    Lumbar disc narrowing    Living in nursing home    Hypertension    Diabetes mellitus without complication (HCC)    CHF (congestive heart failure) (HCC)    Acute pain of left knee    Fall 11/21/2018   Carotid stenosis, right 11/18/2018   Uncontrolled type 2 diabetes mellitus with hypoglycemia, with long-term current use of insulin (HCC) 11/18/2018   Family hx-stroke 11/18/2018   Cerebral infarction due to stenosis of right carotid artery (HCC) s/p tPA 11/15/2018   Pustules determined by examination 04/11/2018   Hyperlipidemia 01/14/2018   COPD (chronic obstructive pulmonary disease) (HCC) 01/14/2018   Angina pectoris (HCC) 07/28/2016   Chronic diastolic congestive heart failure (HCC) 07/28/2016   Hypertensive heart disease with heart failure (HCC) 07/28/2016   Diabetic polyneuropathy associated with type 2 diabetes mellitus (HCC) 11/30/2015   Pain due to onychomycosis of toenail of right foot 08/17/2015    Orientation RESPIRATION BLADDER  Height & Weight     Self, Time, Situation, Place  Normal Incontinent Weight:   Height:     BEHAVIORAL SYMPTOMS/MOOD NEUROLOGICAL BOWEL NUTRITION STATUS      Continent Diet (See dc summary)  AMBULATORY STATUS COMMUNICATION OF NEEDS Skin   Limited Assist Verbally Normal                       Personal Care Assistance Level of Assistance  Bathing, Feeding, Dressing Bathing Assistance: Limited assistance Feeding assistance: Independent Dressing Assistance: Limited assistance     Functional Limitations Info  Sight Sight Info: Impaired        SPECIAL CARE FACTORS FREQUENCY  PT (By licensed PT), OT (By licensed OT)     PT Frequency: 5x/week OT Frequency: 5x/week            Contractures Contractures Info: Not present    Additional Factors Info  Code Status, Allergies, Insulin Sliding Scale Code Status Info: DNR Allergies Info: Alendronate, Valsartan   Insulin Sliding Scale Info: See dc summary       Current Medications (11/08/2022):  This is the current hospital active medication list Current Facility-Administered Medications  Medication Dose Route Frequency Provider Last Rate Last Admin   [MAR Hold] acetaminophen (TYLENOL) tablet 1,000 mg  1,000 mg Oral TID Alberteen Sam, MD   1,000 mg at 11/08/22 0901   [MAR Hold] atorvastatin (LIPITOR) tablet 80 mg  80 mg Oral QHS Alberteen Sam, MD   80 mg at 11/07/22 2241   [facility] Hold]  clopidogrel (PLAVIX) tablet 75 mg  75 mg Oral q morning Danford, Earl Lites, MD   75 mg at 11/08/22 0902   dextrose 50 % solution            [MAR Hold] enoxaparin (LOVENOX) injection 40 mg  40 mg Subcutaneous Q24H Alberteen Sam, MD   40 mg at 11/07/22 1729   [MAR Hold] ezetimibe (ZETIA) tablet 10 mg  10 mg Oral q morning Alberteen Sam, MD   10 mg at 11/08/22 0902   [MAR Hold] fesoterodine (TOVIAZ) tablet 4 mg  4 mg Oral Daily Danford, Earl Lites, MD   4 mg at 11/08/22 0902   [MAR Hold] gabapentin (NEURONTIN)  capsule 300 mg  300 mg Oral BID Alberteen Sam, MD   300 mg at 11/08/22 0902   [MAR Hold] hydrALAZINE (APRESOLINE) injection 10 mg  10 mg Intravenous Q6H PRN Leroy Sea, MD       [MAR Hold] insulin aspart (novoLOG) injection 0-15 Units  0-15 Units Subcutaneous TID WC Leroy Sea, MD       [MAR Hold] insulin glargine-yfgn (SEMGLEE) injection 20 Units  20 Units Subcutaneous Daily Leroy Sea, MD       lidocaine-EPINEPHrine (XYLOCAINE W/EPI) 1 %-1:100000 (with pres) injection            lidocaine-EPINEPHrine (XYLOCAINE W/EPI) 1 %-1:100000 (with pres) injection    PRN Camnitz, Will Daphine Deutscher, MD   20 mL at 11/08/22 1423   [MAR Hold] ondansetron (ZOFRAN) tablet 4 mg  4 mg Oral Q6H PRN Alberteen Sam, MD       Or   [facility] Hold] ondansetron (ZOFRAN) injection 4 mg  4 mg Intravenous Q6H PRN Alberteen Sam, MD   4 mg at 11/08/22 0758   [MAR Hold] oxyCODONE (Oxy IR/ROXICODONE) immediate release tablet 5 mg  5 mg Oral Q4H PRN Alberteen Sam, MD   5 mg at 11/07/22 2011   [MAR Hold] rOPINIRole (REQUIP) tablet 1 mg  1 mg Oral QHS Danford, Earl Lites, MD   1 mg at 11/07/22 2240     Discharge Medications: Please see discharge summary for a list of discharge medications.  Relevant Imaging Results:  Relevant Lab Results:   Additional Information SSN: [id number] [place], [place]

## 2022-11-08 NOTE — Evaluation (Addendum)
Physical Therapy Evaluation Patient Details Name: [name] MRN: [id number] DOB: Mar 21, 1946 Today's Date: 11/08/2022  History of Present Illness  Patient is a 77 year old female with syncope with fall striking her ribs on the commode. EMS noted her slumped over with gaze preference. Found to have 7-9th right rib fractures. History of syncopal episodes, stroke, COPD, HTN, CHF.   Clinical Impression  Patient is agreeable to PT evaluation. She reports she lives with her spouse and has supportive family that can assist her at home if needed. She has a ramped entrance and uses a 2 wheeled walker for short distance ambulation and a wheelchair for longer distances. She typically does not require assistance with mobility but does report several falls related to syncopal episodes.  The patient is able to stand with Min A for steadying. Standing balance is fair, however standing tolerance is limited to around 3 minutes. No dizziness reported with activity, however patient does endorse feeling generalized weakness. Patient feels unable to progress ambulation safely at this time. Orthostatic vitals taken during session. Anticipate the patient will require intermittent supervision/assistance at discharge. Recommend to continue PT to maximize independence and decrease caregiver burden.   Orthostatic vitals taken during PT evaluation:  BP- Lying Pulse- Lying BP- Sitting Pulse- Sitting BP- Standing at 0 minutes Pulse- Standing at 0 minutes BP-Standing 3 minutes Pulse- Standing 3 minutes  11/08/22 0930 127/75 74 (!) 148/95 77 130/72 89 136/69 86                              Assistance Recommended at Discharge Intermittent Supervision/Assistance  If plan is discharge home, recommend the following:  Can travel by private vehicle  A little help with walking and/or transfers;A little help with bathing/dressing/bathroom;Help with stairs or ramp for entrance;Assist for transportation;Assistance with  cooking/housework        Equipment Recommendations None recommended by PT  Recommendations for Other Services       Functional Status Assessment Patient has had a recent decline in their functional status and demonstrates the ability to make significant improvements in function in a reasonable and predictable amount of time.     Precautions / Restrictions Precautions Precautions: Fall Precaution Comments: monitor BP Restrictions Weight Bearing Restrictions: No      Mobility  Bed Mobility Overal bed mobility: Needs Assistance Bed Mobility: Supine to Sit, Sit to Supine     Supine to sit: Min guard, HOB elevated Sit to supine: Min guard, HOB elevated   General bed mobility comments: increased time and effort required    Transfers Overall transfer level: Needs assistance Equipment used: Rolling walker (2 wheels) Transfers: Sit to/from Stand, Bed to chair/wheelchair/BSC Sit to Stand: Min assist          Lateral/Scoot Transfers: Supervision General transfer comment: patient able to stand x 1 bout with steadying assistance required. cues for hand placement for safety. patient then performed incremental scooting transfer along edge of bed x 4 bouts with supervision. no dizziness is reported with standing, however patient does report generalized weakness    Ambulation/Gait               General Gait Details: not tested due to feeling generalized weakness with standing for 3 minutes to complete orthostatic vitals assessment. could likely benefit from chair follow with ambulation for safety when ready given history of recurrent syncope (she reports no warning signs noted in the past)  Stairs  Wheelchair Mobility     Tilt Bed    Modified Rankin (Stroke Patients Only)       Balance Overall balance assessment: Needs assistance Sitting-balance support: Feet supported Sitting balance-Leahy Scale: Fair Sitting balance - Comments: no loss of  balance while seated. close stand by assistance for safety   Standing balance support: Bilateral upper extremity supported Standing balance-Leahy Scale: Fair Standing balance comment: no gross loss of balance while using rolling walker for UE support in standing                             Pertinent Vitals/Pain Pain Assessment Pain Assessment: 0-10 Pain Score: 7  Pain Location: right flank Pain Descriptors / Indicators: Sharp, Discomfort, Guarding, Grimacing Pain Intervention(s): Limited activity within patient's tolerance, Monitored during session, Repositioned (encouraged bracing right flank with mobility/coughing for comfort)    Home Living Family/patient expects to be discharged to:: Private residence Living Arrangements: Spouse/significant other Available Help at Discharge: Family Type of Home: House Home Access: Ramped entrance       Home Layout: One level Home Equipment: Agricultural consultant (2 wheels);Wheelchair - Manufacturing systems engineer;Other (comment) (grabber and sock aide) Additional Comments: reports having home health PT and nursing at home    Prior Function Prior Level of Function : Independent/Modified Independent             Mobility Comments: uses RW for household distances and wheelchair for longer distances ADLs Comments: Mod I     Hand Dominance   Dominant Hand: Right    Extremity/Trunk Assessment   Upper Extremity Assessment Upper Extremity Assessment: Defer to OT evaluation    Lower Extremity Assessment Lower Extremity Assessment: Generalized weakness       Communication   Communication: No difficulties  Cognition Arousal/Alertness: Awake/alert Behavior During Therapy: WFL for tasks assessed/performed Overall Cognitive Status: No family/caregiver present to determine baseline cognitive functioning                                 General Comments: Patient is oriented x 4, able to follow single step commands with  increased time. short term memory seems impaired at times        General Comments General comments (skin integrity, edema, etc.): no dizziness or nausea is reported with mobility    Exercises     Assessment/Plan    PT Assessment Patient needs continued PT services  PT Problem List Decreased strength;Decreased range of motion;Decreased activity tolerance;Decreased mobility;Decreased safety awareness       PT Treatment Interventions DME instruction;Gait training;Stair training;Therapeutic activities;Functional mobility training;Therapeutic exercise;Balance training;Neuromuscular re-education;Patient/family education;Cognitive remediation;Wheelchair mobility training    PT Goals (Current goals can be found in the Care Plan section)  Acute Rehab PT Goals Patient Stated Goal: to return home PT Goal Formulation: With patient Time For Goal Achievement: 11/22/22 Potential to Achieve Goals: Good    Frequency Min 3X/week     Co-evaluation PT/OT/SLP Co-Evaluation/Treatment: Yes (partial co-evaluation) Reason for Co-Treatment: For patient/therapist safety;Complexity of the patient's impairments (multi-system involvement) PT goals addressed during session: Mobility/safety with mobility         AM-PAC PT "6 Clicks" Mobility  Outcome Measure Help needed turning from your back to your side while in a flat bed without using bedrails?: A Little Help needed moving from lying on your back to sitting on the side of a flat bed without using bedrails?: A Little  Help needed moving to and from a bed to a chair (including a wheelchair)?: A Little Help needed standing up from a chair using your arms (e.g., wheelchair or bedside chair)?: A Little Help needed to walk in hospital room?: A Little Help needed climbing 3-5 steps with a railing? : A Lot 6 Click Score: 17    End of Session   Activity Tolerance: Patient limited by fatigue Patient left: in bed;with call bell/phone within reach;with bed  alarm set Nurse Communication: Mobility status PT Visit Diagnosis: Muscle weakness (generalized) (M62.81);Unsteadiness on feet (R26.81)    Time: 0981-1914 PT Time Calculation (min) (ACUTE ONLY): 32 min   Charges:   PT Evaluation $PT Eval Moderate Complexity: 1 Mod   PT General Charges $$ ACUTE PT VISIT: 1 Visit         Donna Bernard, PT, MPT   Ina Homes 11/08/2022, 10:11 AM

## 2022-11-08 NOTE — Progress Notes (Signed)
EEG complete - results pending 

## 2022-11-08 NOTE — Progress Notes (Signed)
  Echocardiogram 2D Echocardiogram has been performed.  [name] 11/08/2022, 3:30 PM

## 2022-11-08 NOTE — Consult Note (Addendum)
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 yesterday with incoherent/garbled speech and right gaze - had nonspecific EKG changes and borderline QT prolongation on admit EKG compared to today - had recent 2 week Zio with generally benign findings but unfortunately was no longer wearing monitor at time of event, so cannot definitively exclude cardiac arrhythmia - has comorbid conditions of strokes, multivessel CAD, carotid disease - agree with echocardiogram, will cancel outpatient order/appt - will review with MD  2. PACs, PVCs, brief NSVT on monitor (5 beats) and then brief PAT this AM - insignificant burden by monitor - follow on telemetry - add TSH, Mg to labs - replete K with to goal 4.0  3. H/o stroke - imaging suggests strokes in several different territories previously; further input per neurology - remains on Plavix  4. Carotid artery disease - s/p R CEA in 2020 - CTA head/neck  findings as above  5. CAD - on Plavix PTA as antiplatelet given CVA hx - continue rosuvastatin, Zetia - reviewed admit EKG with MD, not felt to represent acute MI, but dynamic changes noted between yesterday and today - will review plans with MD  6. Chronic HFpEF, HTN - manage in context above - volume status looks OK - IM holding torsemide, potassium for now - BP has been variable  7. DM - pt reported episodic hypoglycemia episodes to MD, will hold SGLT2i, further review of insulin orders per IM (relayed in secure chat)  Risk Assessment/Risk Scores:        New York Heart Association (NYHA) Functional Class NYHA Class II   For questions or updates, please contact [facility] Please consult [email or web address] for contact info under    Signed, Laurann Montana, PA-C  11/08/2022 9:10 AM

## 2022-11-08 NOTE — Inpatient Diabetes Management (Addendum)
Inpatient Diabetes Program Recommendations  AACE/ADA: New Consensus Statement on Inpatient Glycemic Control (2015)  Target Ranges:  Prepandial:   less than 140 mg/dL      Peak postprandial:   less than 180 mg/dL (1-2 hours)      Critically ill patients:  140 - 180 mg/dL   Lab Results  Component Value Date   GLUCAP 82 11/08/2022   HGBA1C 8.2 (H) 11/08/2022    Latest Reference Range & Units 11/07/22 13:44 11/07/22 16:42 11/07/22 21:03 11/08/22 05:29 11/08/22 05:46 11/08/22 07:26  Glucose-Capillary 70 - 99 mg/dL 88 88 409 (H) 67 (L) 71 82  (H): Data is abnormally high (L): Data is abnormally low  Diabetes history: DM2 Outpatient Diabetes medications: Farxiga 10 mg every day, Amaryl 4 mg bid, Humalog 30 units tid, Lantus 40 units bid  Current orders for Inpatient glycemic control: Semglee 30 units bid, Novolog 0-20 units tid correction  Inpatient Diabetes Program Recommendations:   Fasting CBG 71 this am.  Please consider: -Decrease Semglee to 25 units bid -Decrease Novolog 0-9 units tid  Thank you, [name]. Rosealynn Mateus, RN, MSN, CDE  Diabetes Coordinator Inpatient Glycemic Control Team Team Pager [phone] (8am-5pm) 11/08/2022 10:28 AM

## 2022-11-08 NOTE — TOC CAGE-AID Note (Signed)
Transition of Care [facility]) - CAGE-AID Screening   Patient Details  Name: [name] MRN: [id number] Date of Birth: 04-22-1946  Transition of Care [facility]) CM/SW Contact:    Leota Sauers, RN Phone Number: 11/08/2022, 6:25 AM   Clinical Narrative:  Patient denies use of alcohol and illicit drugs, education not offered at this time.  CAGE-AID Screening:    Have You Ever Felt You Ought to Cut Down on Your Drinking or Drug Use?: No Have People Annoyed You By Critizing Your Drinking Or Drug Use?: No Have You Felt Bad Or Guilty About Your Drinking Or Drug Use?: No Have You Ever Had a Drink or Used Drugs First Thing In The Morning to Steady Your Nerves or to Get Rid of a Hangover?: No CAGE-AID Score: 0  Substance Abuse Education Offered: No

## 2022-11-08 NOTE — Progress Notes (Signed)
SURGEON:  Francis Dowse, PA Assistant: Will Jorja Loa, MD     PREPROCEDURE DIAGNOSIS:  Syncope    POSTPROCEDURE DIAGNOSIS: Syncope     PROCEDURES:   1. Implantable loop recorder implantation    INTRODUCTION:  [name] presents with a history of syncope The costs of loop recorder monitoring have been discussed with the patient. Appropriate time out was performed prior to the procedure.    DESCRIPTION OF PROCEDURE:  Informed written consent was obtained.  The patient required no sedation for the procedure today.  Mapping over the patient's chest was performed to identify the area where electrograms were most prominent for ILR recording.  This area was found to be the left parasternal region over the 4th intercostal space. The patients left chest was therefore prepped and draped in the usual sterile fashion. The skin overlying the left parasternal region was infiltrated with lidocaine for local analgesia.  A 0.5-cm incision was made over the left parasternal region over the 3rd intercostal space.  A subcutaneous ILR pocket was fashioned using a combination of sharp and blunt dissection.  A [employer] Reveal LINQ 2 (serial # [id number]) implantable loop recorder was then placed into the pocket  R waves were very prominent and measured 0.29mV.  Steri- Strips and a sterile dressing were then applied.  There were no early apparent complications.     Will Camnitz was present during the entirety of the case for proctoring and supervision.   CONCLUSIONS:   1. Successful implantation of a implantable loop recorder for a history of syncope  2. No early apparent complications.    Francis Dowse, PA-C

## 2022-11-08 NOTE — Consult Note (Addendum)
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 risks and has agreed to the procedure.  Stephfon Bovey M. Neamiah Sciarra MD 11/08/2022 2:30 PM

## 2022-11-08 NOTE — Progress Notes (Addendum)
PROGRESS NOTE                                                                                                                                                                                                             Patient Demographics:    [name], is a 77 y.o. female, DOB - [birth year], ONG:[id number]  Outpatient Primary MD for the patient is Paulina Fusi, MD    LOS - 0  Admit date - 11/07/2022    Chief Complaint  Patient presents with   Fall       Brief Narrative (HPI from H&P)   77 y.o. F with HTN, DM, hx CVA without residual deficits, dCHF, carotid PVD, and COPD who presented with syncope, she is a poor historian but says that she has been having episodes where she passes out for several minutes, these episodes have been happening once or twice a month for the last several months.  According to the husband she went to a funeral where she had passed out for at least 15 minutes, no bowel or bladder incontinence, no chest pain, came to the ER where she underwent CT head which was nonacute she was admitted for further care.  Of note patient was recently seen by cardiology and given a Zio patch which per prelim evaluation appears stable.   Subjective:    [name] today has, No headache, R sided chest pain, No abdominal pain - No Nausea, No new weakness tingling or numbness, no SOB   Assessment  & Plan :   Recurrent episodes of Syncope - Patient's ability to articulate her symptoms is limited, husband pretty forgetful as well.  No clear reason for recurrent syncope, CBGs are borderline low, has bilateral carotid artery disease,  blood pressures are soft and there was suspicion of dysrhythmia.  He has been seen by cardiology, recent evaluation of Zio patch was stable, continue rhythm monitoring, obtain echocardiogram, monitor orthostatics, advance activity with PT OT.  For her bilateral carotid artery disease.   Vascular surgery input, continue Plavix and statin for now.  Right 7-9th rib fractures due to fall at home.  Supportive care.  Hypertension  - Continue ARB, hydralazine, adjust diuretics as needed.  Cerebrovascular disease - MRI brain.  Nonacute.  Continue Plavix, Zetia, Lipitor  Carotid stenosis, bilateral  On Plavix and statin, will  get vascular surgery input as well.  COPD (chronic obstructive pulmonary disease) (HCC)  No flare  Hyperlipidemia - Continue Lipitor and Zetia  Chronic diastolic congestive heart failure (HCC)  Appears euvolemic.  Cardiology following.  Diuretics on hold.  Marcelline Deist to be discontinued upon discharge per cardiology.  Uncontrolled type 2 diabetes mellitus with hypoglycemia, with long-term current use of insulin (HCC) Discontinue glimepiride and Farxiga, insulin adjusted.  Monitor.   Lab Results  Component Value Date   HGBA1C 8.2 (H) 11/08/2022   CBG (last 3)  Recent Labs    11/08/22 0529 11/08/22 0546 11/08/22 0726  GLUCAP 67* 71 82         Condition - Extremely Guarded  Family Communication  :  husband Lorella Nimrod [phone] - 11/08/22  Code Status :  DNR  Consults  :  Cards, vascular surgery  PUD Prophylaxis :     Procedures  :     TTE  MRI - 1. Intermittently motion degraded examination. 2. No evidence of an acute intracranial abnormality. Specifically, the diffusion-weighted imaging is of good quality and there is no evidence of an acute infarct. 3. Small chronic cortical/subcortical infarcts within the bilateral parietal and left occipital lobes, new from the prior brain MRI of 05/18/2020. 4. Chronic lacunar infarct within the left thalamus, new from the prior MRI. 5. Background mild chronic small vessel ischemic changes within the cerebral white matter and pons. 6. Redemonstrated tiny chronic infarct within the left cerebellar hemisphere. 7. Mild-to-moderate generalized cerebral atrophy. 8. 6 mm soft tissue lesion arising from the right  pterional scalp. Direct examination recommended.   CTA -  1. Image quality in the neck is degraded by motion artifact as well as streak artifact from venous contamination. Specifically, the left common carotid artery and carotid bifurcation are not well evaluated. The imaged left internal carotid artery in the neck is widely patent. 2. Patent right carotid system without hemodynamically significant stenosis or occlusion. Patent vertebral arteries with mild stenosis at the origin on the right. 3. Calcified plaque in the intracranial ICAs resulting in moderate to severe stenosis bilaterally, and short-segment moderate stenosis of the left P1/P2 segment.       Disposition Plan  :    Status is: Observation  DVT Prophylaxis  :    enoxaparin (LOVENOX) injection 40 mg Start: 11/07/22 1630    Lab Results  Component Value Date   PLT 288 11/08/2022    Diet :  Diet Order             Diet Heart Room service appropriate? Yes; Fluid consistency: Thin  Diet effective now                    Inpatient Medications  Scheduled Meds:  acetaminophen  1,000 mg Oral TID   atorvastatin  80 mg Oral QHS   clopidogrel  75 mg Oral q morning   dapagliflozin propanediol  10 mg Oral Daily   enoxaparin (LOVENOX) injection  40 mg Subcutaneous Q24H   ezetimibe  10 mg Oral q morning   fesoterodine  4 mg Oral Daily   gabapentin  300 mg Oral BID   hydrALAZINE  25 mg Oral Q8H   insulin aspart  0-20 Units Subcutaneous TID WC   insulin aspart  0-5 Units Subcutaneous QHS   insulin glargine-yfgn  30 Units Subcutaneous BID   irbesartan  300 mg Oral Daily   potassium chloride  40 mEq Oral Once   rOPINIRole  1 mg Oral  QHS   Continuous Infusions: PRN Meds:.ondansetron **OR** ondansetron (ZOFRAN) IV, oxyCODONE  Antibiotics  :    Anti-infectives (From admission, onward)    None         Objective:   Vitals:   11/08/22 0400 11/08/22 0539 11/08/22 0729 11/08/22 0800  BP: 133/71 134/76 (!) 91/55  112/66  Pulse: 68 71 62 69  Resp: 15 13 17 20   Temp: 97.8 F (36.6 C)  97.7 F (36.5 C)   TempSrc: Oral  Oral   SpO2: 96% 96% 95% 97%    Wt Readings from Last 3 Encounters:  10/24/22 73.5 kg  07/21/22 72.6 kg  01/20/22 79.8 kg    No intake or output data in the 24 hours ending 11/08/22 1019   Physical Exam  Awake Alert, No new F.N deficits, Normal affect [place].AT,PERRAL Supple Neck, No JVD,   Symmetrical Chest wall movement, Good air movement bilaterally, CTAB RRR,No Gallops,Rubs or new Murmurs,  +ve B.Sounds, Abd Soft, No tenderness,   No Cyanosis, Clubbing or edema         Data Review:    Recent Labs  Lab 11/07/22 1050 11/08/22 0314  WBC 12.8* 11.2*  HGB 12.6 13.2  HCT 41.2 41.5  PLT 293 288  MCV 87.3 84.5  MCH 26.7 26.9  MCHC 30.6 31.8  RDW 15.2 15.6*  LYMPHSABS 2.3  --   MONOABS 1.1*  --   EOSABS 0.1  --   BASOSABS 0.1  --     Recent Labs  Lab 11/07/22 1050 11/08/22 0314  NA 140 137  K 3.4* 3.6  CL 105 99  CO2 26 31  ANIONGAP 9 7  GLUCOSE 66* 55*  BUN 10 11  CREATININE 1.02* 0.98  AST 23  --   ALT 15  --   ALKPHOS 71  --   BILITOT 0.3  --   ALBUMIN 3.0*  --   INR 1.1  --   HGBA1C  --  8.2*  CALCIUM 8.5* 8.7*      Recent Labs  Lab 11/07/22 1050 11/08/22 0314  INR 1.1  --   HGBA1C  --  8.2*  CALCIUM 8.5* 8.7*   Lab Results  Component Value Date   CHOL 226 (H) 11/16/2018   HDL 44 11/16/2018   LDLCALC 157 (H) 11/16/2018   TRIG 124 11/16/2018   CHOLHDL 5.1 11/16/2018    Lab Results  Component Value Date   HGBA1C 8.2 (H) 11/08/2022    Micro Results No results found for this or any previous visit (from the past 240 hour(s)).  Radiology Reports DG Ribs Unilateral W/Chest Right  Result Date: 11/07/2022 CLINICAL DATA:  Status post fall with right lateral/anterior rib pain EXAM: RIGHT RIBS AND CHEST - 3 VIEW COMPARISON:  Chest radiograph dated 03/26/2022 FINDINGS: Normal lung volumes. No focal consolidations. No pneumothorax.  Focal pleural thickening along the lower right lateral lung adjacent to the rib fractures. The heart size and mediastinal contours are within normal limits. Displaced fractures of the right lateral seventh through ninth ribs. IMPRESSION: 1. Displaced fractures of the right lateral seventh through ninth ribs. 2. Focal pleural thickening along the lower right lateral lung adjacent to the rib fractures, likely a small pleural effusion. No pneumothorax. Electronically Signed   By: Agustin Cree M.D.   On: 11/07/2022 14:02   MR BRAIN WO CONTRAST  Result Date: 11/07/2022 CLINICAL DATA:  Provided history: Syncope/presyncope, cerebrovascular cause suspected. Right-sided weakness/pain. EXAM: MRI HEAD WITHOUT CONTRAST TECHNIQUE: Multiplanar, multiecho pulse  sequences of the brain and surrounding structures were obtained without intravenous contrast. COMPARISON:  Non-contrast head CT and CT angiogram head/neck performed earlier today 11/07/2022. Brain MRI 05/18/2020. FINDINGS: Intermittently motion degraded examination, limiting evaluation. Most notably, there is moderate motion degradation of the axial T2 FLAIR sequence and moderate motion degradation of the coronal T2 sequence. Brain: Mild-to-moderate generalized cerebral atrophy. Small chronic cortical/subcortical infarcts within the bilateral parietal and left occipital lobes, new from the prior brain MRI of 05/18/2020. Chronic hemosiderin deposition and/or cortical laminar necrosis associated with the chronic infarct in the right parietal lobe. Background multifocal T2 FLAIR hyperintense signal abnormality within the cerebral white matter and pons, nonspecific but compatible with mild chronic small vessel ischemic disease. Chronic lacunar infarct within the left thalamus, new from the prior MRI. Punctate chronic microhemorrhage within the left parietal lobe (series 14, image 29). Redemonstrated tiny chronic infarct within the left cerebellar hemisphere. Partially empty  sella turcica. There is no acute infarct. No evidence of an intracranial mass. No extra-axial fluid collection. No midline shift. Vascular: Maintained flow voids within the proximal large arterial vessels. Skull and upper cervical spine: No focal suspicious marrow lesion. Incompletely assessed cervical spondylosis. Sinuses/Orbits: No mass or acute finding within the imaged orbits. No significant paranasal sinus disease. Other: Nonspecific 6 mm soft tissue lesion arising from the right pterional scalp. IMPRESSION: 1. Intermittently motion degraded examination. 2. No evidence of an acute intracranial abnormality. Specifically, the diffusion-weighted imaging is of good quality and there is no evidence of an acute infarct. 3. Small chronic cortical/subcortical infarcts within the bilateral parietal and left occipital lobes, new from the prior brain MRI of 05/18/2020. 4. Chronic lacunar infarct within the left thalamus, new from the prior MRI. 5. Background mild chronic small vessel ischemic changes within the cerebral white matter and pons. 6. Redemonstrated tiny chronic infarct within the left cerebellar hemisphere. 7. Mild-to-moderate generalized cerebral atrophy. 8. 6 mm soft tissue lesion arising from the right pterional scalp. Direct examination recommended. Electronically Signed   By: Jackey Loge D.O.   On: 11/07/2022 12:34   CT ANGIO HEAD NECK W WO CM  Result Date: 11/07/2022 CLINICAL DATA:  Right-sided gaze, slumped over EXAM: CT ANGIOGRAPHY HEAD AND NECK WITH AND WITHOUT CONTRAST TECHNIQUE: Multidetector CT imaging of the head and neck was performed using the standard protocol during bolus administration of intravenous contrast. Multiplanar CT image reconstructions and MIPs were obtained to evaluate the vascular anatomy. Carotid stenosis measurements (when applicable) are obtained utilizing NASCET criteria, using the distal internal carotid diameter as the denominator. RADIATION DOSE REDUCTION: This exam  was performed according to the departmental dose-optimization program which includes automated exposure control, adjustment of the mA and/or kV according to patient size and/or use of iterative reconstruction technique. CONTRAST:  75mL OMNIPAQUE IOHEXOL 350 MG/ML SOLN COMPARISON:  Same-day noncontrast CT head, carotid Doppler 01/26/2019, CTA head/neck 11/15/2018 FINDINGS: Image quality is degraded by motion artifact particularly at the level of the carotid bifurcations. CTA NECK FINDINGS Aortic arch: There is calcified plaque in the imaged aortic arch. The origins of the major branch vessels are patent. The subclavian arteries are patent to the level imaged. Right carotid system: The right common, internal, and external carotid arteries appear patent, without evidence of hemodynamically significant stenosis or occlusion there is no evidence of dissection or aneurysm, within the confines of above-described motion artifact. Left carotid system: The left common carotid artery is suboptimally evaluated due to motion and streak artifact from adjacent venous contrast. The bifurcation  is also not well evaluated. The distal internal carotid artery in the neck is widely patent. There is no definite dissection or aneurysm, within the above-described confines. Vertebral arteries: There is mild stenosis of the origin of the right vertebral artery. The vertebral arteries are otherwise patent, without hemodynamically significant stenosis or occlusion. There is no evidence of dissection or aneurysm. Skeleton: There is multilevel disc space narrowing and degenerative endplate change of the cervical spine. There is no visible canal hematoma. Other neck: The soft tissues of the neck are unremarkable. Upper chest: The imaged lung apices are clear. Review of the MIP images confirms the above findings CTA HEAD FINDINGS Anterior circulation: There is calcified plaque in the intracranial ICAs resulting in up to moderate to severe stenosis  bilaterally. The bilateral MCAs and ACAs are patent, without proximal stenosis or occlusion. The anterior communicating artery is normal. There is no aneurysm or AVM. Posterior circulation: The bilateral V4 segments are patent with mild calcified plaque on the left. The basilar artery is patent. The major cerebellar arteries appear patent. The PCAs are patent, with short-segment moderate stenosis of the left P1/P2 segment (11-25). Posterior communicating arteries are not identified. There is no aneurysm or AVM. Venous sinuses: As permitted by contrast timing, patent. Anatomic variants: None. Review of the MIP images confirms the above findings IMPRESSION: 1. Image quality in the neck is degraded by motion artifact as well as streak artifact from venous contamination. Specifically, the left common carotid artery and carotid bifurcation are not well evaluated. The imaged left internal carotid artery in the neck is widely patent. 2. Patent right carotid system without hemodynamically significant stenosis or occlusion. Patent vertebral arteries with mild stenosis at the origin on the right. 3. Calcified plaque in the intracranial ICAs resulting in moderate to severe stenosis bilaterally, and short-segment moderate stenosis of the left P1/P2 segment. Electronically Signed   By: Lesia Hausen M.D.   On: 11/07/2022 11:43   CT HEAD WO CONTRAST ( )  Result Date: 11/07/2022 CLINICAL DATA:  Fall on blood thinners EXAM: CT HEAD WITHOUT CONTRAST TECHNIQUE: Contiguous axial images were obtained from the base of the skull through the vertex without intravenous contrast. RADIATION DOSE REDUCTION: This exam was performed according to the departmental dose-optimization program which includes automated exposure control, adjustment of the mA and/or kV according to patient size and/or use of iterative reconstruction technique. COMPARISON:  05/17/2020 FINDINGS: Brain: No evidence of acute infarction, hemorrhage, hydrocephalus,  extra-axial collection or mass lesion/mass effect. Periventricular and deep white matter hypodensity. New encephalomalacia of the right parietal vertex (4, image 20. Vascular: No hyperdense vessel or unexpected calcification. Skull: Normal. Negative for fracture or focal lesion. Sinuses/Orbits: No acute finding. Other: None. IMPRESSION: 1. No acute intracranial pathology. Small-vessel white matter disease. 2. New encephalomalacia of the right parietal vertex, consistent with interval, although nonacute infarction. Electronically Signed   By: Jearld Lesch M.D.   On: 11/07/2022 10:55      Signature  -   Susa Raring M.D on 11/08/2022 at 10:19 AM   -  To page go to [email or web address]

## 2022-11-09 DIAGNOSIS — R55 Syncope and collapse: Secondary | ICD-10-CM | POA: Diagnosis not present

## 2022-11-09 LAB — MAGNESIUM: Magnesium: 2.2 mg/dL (ref 1.7–2.4)

## 2022-11-09 LAB — CBC
HCT: 46.2 % — ABNORMAL HIGH (ref 36.0–46.0)
Hemoglobin: 14.6 g/dL (ref 12.0–15.0)
MCH: 27.1 pg (ref 26.0–34.0)
MCHC: 31.6 g/dL (ref 30.0–36.0)
MCV: 85.7 fL (ref 80.0–100.0)
Platelets: 275 10*3/uL (ref 150–400)
RBC: 5.39 MIL/uL — ABNORMAL HIGH (ref 3.87–5.11)
RDW: 15.9 % — ABNORMAL HIGH (ref 11.5–15.5)
WBC: 9.7 10*3/uL (ref 4.0–10.5)
nRBC: 0 % (ref 0.0–0.2)

## 2022-11-09 LAB — GLUCOSE, CAPILLARY
Glucose-Capillary: 95 mg/dL (ref 70–99)
Glucose-Capillary: 250 mg/dL — ABNORMAL HIGH (ref 70–99)
Glucose-Capillary: 96 mg/dL (ref 70–99)

## 2022-11-09 LAB — BASIC METABOLIC PANEL
Anion gap: 9 (ref 5–15)
BUN: 17 mg/dL (ref 8–23)
CO2: 27 mmol/L (ref 22–32)
Calcium: 8.7 mg/dL — ABNORMAL LOW (ref 8.9–10.3)
Chloride: 101 mmol/L (ref 98–111)
Creatinine, Ser: 1.07 mg/dL — ABNORMAL HIGH (ref 0.44–1.00)
GFR, Estimated: 54 mL/min — ABNORMAL LOW (ref >60)
Glucose, Bld: 100 mg/dL — ABNORMAL HIGH (ref 70–99)
Potassium: 4 mmol/L (ref 3.5–5.1)
Sodium: 137 mmol/L (ref 135–145)

## 2022-11-09 MED ORDER — INSULIN GLARGINE-YFGN 100 UNIT/ML ~~LOC~~ SOLN
15.0000 [IU] | Freq: Every day | SUBCUTANEOUS | Status: DC
  Administered 2022-11-09 – 2022-11-10 (×2): 15 [IU] via SUBCUTANEOUS
  Filled 2022-11-09 (×2): qty 0.15

## 2022-11-09 NOTE — Progress Notes (Signed)
PROGRESS NOTE                                                                                                                                                                                                             Patient Demographics:    [name], is a 77 y.o. female, DOB - 1946/01/18, ZOX:[id number]  Outpatient Primary MD for the patient is Paulina Fusi, MD    LOS - 1  Admit date - 11/07/2022    Chief Complaint  Patient presents with   Fall       Brief Narrative (HPI from H&P)   77 y.o. F with HTN, DM, hx CVA without residual deficits, dCHF, carotid PVD, and COPD who presented with syncope, she is a poor historian but says that she has been having episodes where she passes out for several minutes, these episodes have been happening once or twice a month for the last several months.  According to the husband she went to a funeral where she had passed out for at least 15 minutes, no bowel or bladder incontinence, no chest pain, came to the ER where she underwent CT head which was nonacute she was admitted for further care.  Of note patient was recently seen by cardiology and given a Zio patch which per prelim evaluation appears stable.   Subjective:   Patient in bed, appears comfortable, denies any headache, no fever, mild pleuritic right-sided chest pain around the site of her rib fractures, no shortness of breath , no abdominal pain. No focal weakness.   Assessment  & Plan :   Recurrent episodes of Syncope - Patient's ability to articulate her symptoms is limited, husband pretty forgetful as well.  No clear reason for recurrent syncope, CBGs are borderline low, has bilateral carotid artery disease,  blood pressures are soft and there was suspicion of dysrhythmia.  She has been seen by cardiology, recent evaluation of Zio patch was stable, continue rhythm monitoring, so far stable, stable and nonacute  echocardiogram, stable orthostatics, advance activity with PT OT.  Wondering if most of her episodes are related to hypoglycemia.  For her bilateral carotid artery disease.  Vascular surgery input, continue Plavix and statin for now.  Right 7-9th rib fractures due to fall at home.  Supportive care.  Hypertension  - Continue ARB, hydralazine, adjust diuretics as  needed.  Cerebrovascular disease - MRI brain.  Nonacute.  Continue Plavix, Zetia, Lipitor  Carotid stenosis, bilateral  On Plavix and statin, case discussed with vascular surgeon Dr. Myra Gianotti, no need for intervention as most of the lesions are intracranial.  COPD (chronic obstructive pulmonary disease) (HCC)  No flare  Hyperlipidemia - Continue Lipitor and Zetia  Chronic diastolic congestive heart failure (HCC)  Appears euvolemic.  Cardiology following.  Diuretics on hold.  Marcelline Deist to be discontinued upon discharge per cardiology.  Uncontrolled type 2 diabetes mellitus with hypoglycemia, with long-term current use of insulin (HCC) Discontinue glimepiride and Farxiga, insulin dose adjusted.  Monitor.   Lab Results  Component Value Date   HGBA1C 8.2 (H) 11/08/2022   CBG (last 3)  Recent Labs    11/08/22 1141 11/08/22 1535 11/08/22 2114  GLUCAP 101* 115* 151*         Condition - Extremely Guarded  Family Communication  :  husband Lorella Nimrod [phone] - 11/08/22  Code Status :  DNR  Consults  :  Cards, vascular surgery  PUD Prophylaxis :     Procedures  :     TTE - 1. Left ventricular ejection fraction, by estimation, is 65 to 70%. The left ventricle has normal function. The left ventricle has no regional wall motion abnormalities. There is mild left ventricular hypertrophy. Left ventricular diastolic parameters are consistent with Grade II diastolic dysfunction (pseudonormalization). Elevated left atrial pressure.  2. Right ventricular systolic function is normal. The right ventricular size is normal. There is normal  pulmonary artery systolic pressure. The estimated right ventricular systolic pressure is 26.6 mmHg.  3. The mitral valve is degenerative. Trivial mitral valve regurgitation. Mild mitral stenosis. The mean mitral valve gradient is 4.0 mmHg with average heart rate of 73 bpm. Severe mitral annular calcification.  4. The aortic valve is tricuspid. Aortic valve regurgitation is not visualized. Aortic valve sclerosis/calcification is present, without any evidence of aortic stenosis.  5. The inferior vena cava is normal in size with greater than 50% respiratory variability, suggesting right atrial pressure of 3 mmHg  MRI - 1. Intermittently motion degraded examination. 2. No evidence of an acute intracranial abnormality. Specifically, the diffusion-weighted imaging is of good quality and there is no evidence of an acute infarct. 3. Small chronic cortical/subcortical infarcts within the bilateral parietal and left occipital lobes, new from the prior brain MRI of 05/18/2020. 4. Chronic lacunar infarct within the left thalamus, new from the prior MRI. 5. Background mild chronic small vessel ischemic changes within the cerebral white matter and pons. 6. Redemonstrated tiny chronic infarct within the left cerebellar hemisphere. 7. Mild-to-moderate generalized cerebral atrophy. 8. 6 mm soft tissue lesion arising from the right pterional scalp. Direct examination recommended.   CTA -  1. Image quality in the neck is degraded by motion artifact as well as streak artifact from venous contamination. Specifically, the left common carotid artery and carotid bifurcation are not well evaluated. The imaged left internal carotid artery in the neck is widely patent. 2. Patent right carotid system without hemodynamically significant stenosis or occlusion. Patent vertebral arteries with mild stenosis at the origin on the right. 3. Calcified plaque in the intracranial ICAs resulting in moderate to severe stenosis bilaterally, and  short-segment moderate stenosis of the left P1/P2 segment.       Disposition Plan  :    Status is: Observation  DVT Prophylaxis  :    enoxaparin (LOVENOX) injection 40 mg Start: 11/07/22 1630  Lab Results  Component Value Date   PLT 275 11/09/2022    Diet :  Diet Order             Diet Heart Room service appropriate? Yes; Fluid consistency: Thin  Diet effective now                    Inpatient Medications  Scheduled Meds:  acetaminophen  1,000 mg Oral TID   atorvastatin  80 mg Oral QHS   clopidogrel  75 mg Oral q morning   enoxaparin (LOVENOX) injection  40 mg Subcutaneous Q24H   ezetimibe  10 mg Oral q morning   fesoterodine  4 mg Oral Daily   gabapentin  300 mg Oral BID   insulin aspart  0-15 Units Subcutaneous TID WC   insulin glargine-yfgn  15 Units Subcutaneous Daily   rOPINIRole  1 mg Oral QHS   Continuous Infusions: PRN Meds:.hydrALAZINE, ondansetron **OR** ondansetron (ZOFRAN) IV  Antibiotics  :    Anti-infectives (From admission, onward)    None         Objective:   Vitals:   11/08/22 1539 11/08/22 1929 11/08/22 2340 11/09/22 0331  BP: (!) 116/58 127/68 128/65 134/65  Pulse: 70 82 62 63  Resp: 18 18 15 15   Temp: 97.6 F (36.4 C) (!) 97.3 F (36.3 C) 97.7 F (36.5 C) (!) 96.6 F (35.9 C)  TempSrc: Oral Oral Axillary Axillary  SpO2: 95% 97% 98% 96%    Wt Readings from Last 3 Encounters:  10/24/22 73.5 kg  07/21/22 72.6 kg  01/20/22 79.8 kg    No intake or output data in the 24 hours ending 11/09/22 0802   Physical Exam  Awake Alert, No new F.N deficits, Normal affect [place].AT,PERRAL Supple Neck, No JVD,   Symmetrical Chest wall movement, Good air movement bilaterally, CTAB RRR,No Gallops,Rubs or new Murmurs,  +ve B.Sounds, Abd Soft, No tenderness,   No Cyanosis, Clubbing or edema         Data Review:    Recent Labs  Lab 11/07/22 1050 11/08/22 0314 11/09/22 0141  WBC 12.8* 11.2* 9.7  HGB 12.6 13.2 14.6  HCT  41.2 41.5 46.2*  PLT 293 288 275  MCV 87.3 84.5 85.7  MCH 26.7 26.9 27.1  MCHC 30.6 31.8 31.6  RDW 15.2 15.6* 15.9*  LYMPHSABS 2.3  --   --   MONOABS 1.1*  --   --   EOSABS 0.1  --   --   BASOSABS 0.1  --   --     Recent Labs  Lab 11/07/22 1050 11/08/22 0313 11/08/22 0314 11/09/22 0141  NA 140  --  137 137  K 3.4*  --  3.6 4.0  CL 105  --  99 101  CO2 26  --  31 27  ANIONGAP 9  --  7 9  GLUCOSE 66*  --  55* 100*  BUN 10  --  11 17  CREATININE 1.02*  --  0.98 1.07*  AST 23  --   --   --   ALT 15  --   --   --   ALKPHOS 71  --   --   --   BILITOT 0.3  --   --   --   ALBUMIN 3.0*  --   --   --   INR 1.1  --   --   --   TSH  --  1.658  --   --  HGBA1C  --   --  8.2*  --   MG  --  2.2  --  2.2  CALCIUM 8.5*  --  8.7* 8.7*      Recent Labs  Lab 11/07/22 1050 11/08/22 0313 11/08/22 0314 11/09/22 0141  INR 1.1  --   --   --   TSH  --  1.658  --   --   HGBA1C  --   --  8.2*  --   MG  --  2.2  --  2.2  CALCIUM 8.5*  --  8.7* 8.7*   Lab Results  Component Value Date   CHOL 226 (H) 11/16/2018   HDL 44 11/16/2018   LDLCALC 157 (H) 11/16/2018   TRIG 124 11/16/2018   CHOLHDL 5.1 11/16/2018    Lab Results  Component Value Date   HGBA1C 8.2 (H) 11/08/2022    Micro Results No results found for this or any previous visit (from the past 240 hour(s)).  Radiology Reports ECHOCARDIOGRAM COMPLETE  Result Date: 11/08/2022    ECHOCARDIOGRAM REPORT   Patient Name:   [name] Date of Exam: 11/08/2022 Medical Rec #:  [id number]    Height:       63.0 in Accession #:    [id number]   Weight:       162.0 lb Date of Birth:  02/23/1946    BSA:          1.768 m Patient Age:    77 years     BP:           111/59 mmHg Patient Gender: F            HR:           75 bpm. Exam Location:  Inpatient Procedure: 2D Echo, Cardiac Doppler and Color Doppler Indications:    syncope  History:        Patient has prior history of Echocardiogram examinations, most                 recent 11/16/2018.  COPD; Risk Factors:Hypertension, Dyslipidemia                 and Diabetes.  Sonographer:    Delcie Roch RDCS Referring Phys: [id number] CHRISTOPHER P DANFORD IMPRESSIONS  1. Left ventricular ejection fraction, by estimation, is 65 to 70%. The left ventricle has normal function. The left ventricle has no regional wall motion abnormalities. There is mild left ventricular hypertrophy. Left ventricular diastolic parameters are consistent with Grade II diastolic dysfunction (pseudonormalization). Elevated left atrial pressure.  2. Right ventricular systolic function is normal. The right ventricular size is normal. There is normal pulmonary artery systolic pressure. The estimated right ventricular systolic pressure is 26.6 mmHg.  3. The mitral valve is degenerative. Trivial mitral valve regurgitation. Mild mitral stenosis. The mean mitral valve gradient is 4.0 mmHg with average heart rate of 73 bpm. Severe mitral annular calcification.  4. The aortic valve is tricuspid. Aortic valve regurgitation is not visualized. Aortic valve sclerosis/calcification is present, without any evidence of aortic stenosis.  5. The inferior vena cava is normal in size with greater than 50% respiratory variability, suggesting right atrial pressure of 3 mmHg. FINDINGS  Left Ventricle: Left ventricular ejection fraction, by estimation, is 65 to 70%. The left ventricle has normal function. The left ventricle has no regional wall motion abnormalities. The left ventricular internal cavity size was normal in size. There is  mild left ventricular hypertrophy. Left ventricular diastolic parameters  are consistent with Grade II diastolic dysfunction (pseudonormalization). Elevated left atrial pressure. Right Ventricle: The right ventricular size is normal. No increase in right ventricular wall thickness. Right ventricular systolic function is normal. There is normal pulmonary artery systolic pressure. The tricuspid regurgitant velocity is 2.43 m/s,  and  with an assumed right atrial pressure of 3 mmHg, the estimated right ventricular systolic pressure is 26.6 mmHg. Left Atrium: Left atrial size was normal in size. Right Atrium: Right atrial size was normal in size. Pericardium: There is no evidence of pericardial effusion. Mitral Valve: The mitral valve is degenerative in appearance. Severe mitral annular calcification. Trivial mitral valve regurgitation. Mild mitral valve stenosis. MV peak gradient, 9.5 mmHg. The mean mitral valve gradient is 4.0 mmHg with average heart rate of 73 bpm. Tricuspid Valve: The tricuspid valve is normal in structure. Tricuspid valve regurgitation is trivial. Aortic Valve: The aortic valve is tricuspid. Aortic valve regurgitation is not visualized. Aortic valve sclerosis/calcification is present, without any evidence of aortic stenosis. Pulmonic Valve: The pulmonic valve was grossly normal. Pulmonic valve regurgitation is not visualized. Aorta: The aortic root and ascending aorta are structurally normal, with no evidence of dilitation. Venous: The inferior vena cava is normal in size with greater than 50% respiratory variability, suggesting right atrial pressure of 3 mmHg. IAS/Shunts: The interatrial septum was not well visualized.  LEFT VENTRICLE PLAX 2D LVIDd:         4.70 cm   Diastology LVIDs:         3.00 cm   LV e' medial:    6.09 cm/s LV PW:         1.10 cm   LV E/e' medial:  20.4 LV IVS:        1.10 cm   LV e' lateral:   6.53 cm/s LVOT diam:     1.60 cm   LV E/e' lateral: 19.0 LV SV:         43 LV SV Index:   24 LVOT Area:     2.01 cm  RIGHT VENTRICLE             IVC RV Basal diam:  2.50 cm     IVC diam: 1.70 cm RV S prime:     13.60 cm/s TAPSE (M-mode): 2.0 cm LEFT ATRIUM             Index        RIGHT ATRIUM           Index LA diam:        3.70 cm 2.09 cm/m   RA Area:     12.10 cm LA Vol (A2C):   63.0 ml 35.63 ml/m  RA Volume:   24.20 ml  13.69 ml/m LA Vol (A4C):   44.3 ml 25.06 ml/m LA Biplane Vol: 52.0 ml 29.41  ml/m  AORTIC VALVE LVOT Vmax:   104.00 cm/s LVOT Vmean:  67.700 cm/s LVOT VTI:    0.214 m  AORTA Ao Root diam: 2.40 cm Ao Asc diam:  3.30 cm MITRAL VALVE                TRICUSPID VALVE MV Area (PHT): 2.95 cm     TR Peak grad:   23.6 mmHg MV Area VTI:   1.07 cm     TR Vmax:        243.00 cm/s MV Peak grad:  9.5 mmHg MV Mean grad:  4.0 mmHg     SHUNTS MV Vmax:  1.54 m/s     Systemic VTI:  0.21 m MV Vmean:      81.3 cm/s    Systemic Diam: 1.60 cm MV Decel Time: 257 msec MV E velocity: 124.00 cm/s MV A velocity: 106.00 cm/s MV E/A ratio:  1.17 Epifanio Lesches MD Electronically signed by Epifanio Lesches MD Signature Date/Time: 11/08/2022/4:03:43 PM    Final    EP PPM/ICD IMPLANT  Result Date: 11/08/2022 SURGEON:  Francis Dowse, PA Assistant: Will Jorja Loa, MD   PREPROCEDURE DIAGNOSIS:  Syncope   POSTPROCEDURE DIAGNOSIS: Syncope    PROCEDURES:  1. Implantable loop recorder implantation   INTRODUCTION:  TASFIA HANSEN presents with a history of syncope The costs of loop recorder monitoring have been discussed with the patient. Appropriate time out was performed prior to the procedure.   DESCRIPTION OF PROCEDURE:  Informed written consent was obtained.  The patient required no sedation for the procedure today.  Mapping over the patient's chest was performed to identify the area where electrograms were most prominent for ILR recording.  This area was found to be the left parasternal region over the 4th intercostal space. The patients left chest was therefore prepped and draped in the usual sterile fashion. The skin overlying the left parasternal region was infiltrated with lidocaine for local analgesia.  A 0.5-cm incision was made over the left parasternal region over the 3rd intercostal space.  A subcutaneous ILR pocket was fashioned using a combination of sharp and blunt dissection.  A [employer] Reveal LINQ 2 (serial # [id number]) implantable loop recorder was then placed into the pocket  R waves were  very prominent and measured 0.36mV.  Steri- Strips and a sterile dressing were then applied.  There were no early apparent complications.   Will Camnitz was present during the entirety of the case for proctoring and supervision. CONCLUSIONS:  1. Successful implantation of a implantable loop recorder for a history of syncope  2. No early apparent complications. Will Jorja Loa, MD 11/08/2022 2:32 PM   EEG adult  Result Date: 11/08/2022 Charlsie Quest, MD     11/08/2022  1:30 PM Patient Name: [name] MRN: [id number] Epilepsy Attending: Charlsie Quest Referring Physician/Provider: Leroy Sea, MD Date: 11/08/2022 Duration: 22.46 mins Patient history: 77yo F with syncope. EEG to evaluate for seizure. Level of alertness: Awake AEDs during EEG study: GBP Technical aspects: This EEG study was done with scalp electrodes positioned according to the 10-20 International system of electrode placement. Electrical activity was reviewed with band pass filter of 1-70Hz , sensitivity of 7 uV/mm, display speed of 49mm/sec with a 60Hz  notched filter applied as appropriate. EEG data were recorded continuously and digitally stored.  Video monitoring was available and reviewed as appropriate. Description: The posterior dominant rhythm consists of 6 Hz activity of moderate voltage (25-35 uV) seen predominantly in posterior head regions, symmetric and reactive to eye opening and eye closing. EEG showed continuous generalized 5 to 7 Hz theta slowing. Hyperventilation and photic stimulation were not performed.   ABNORMALITY - Continuous slow, generalized - Background slow IMPRESSION: This study is suggestive of mild to moderate diffuse encephalopathy, nonspecific etiology. No seizures or epileptiform discharges were seen throughout the recording. Charlsie Quest   DG Ribs Unilateral W/Chest Right  Result Date: 11/07/2022 CLINICAL DATA:  Status post fall with right lateral/anterior rib pain EXAM: RIGHT RIBS AND CHEST - 3  VIEW COMPARISON:  Chest radiograph dated 03/26/2022 FINDINGS: Normal lung volumes. No focal consolidations. No pneumothorax. Focal  pleural thickening along the lower right lateral lung adjacent to the rib fractures. The heart size and mediastinal contours are within normal limits. Displaced fractures of the right lateral seventh through ninth ribs. IMPRESSION: 1. Displaced fractures of the right lateral seventh through ninth ribs. 2. Focal pleural thickening along the lower right lateral lung adjacent to the rib fractures, likely a small pleural effusion. No pneumothorax. Electronically Signed   By: Agustin Cree M.D.   On: 11/07/2022 14:02   MR BRAIN WO CONTRAST  Result Date: 11/07/2022 CLINICAL DATA:  Provided history: Syncope/presyncope, cerebrovascular cause suspected. Right-sided weakness/pain. EXAM: MRI HEAD WITHOUT CONTRAST TECHNIQUE: Multiplanar, multiecho pulse sequences of the brain and surrounding structures were obtained without intravenous contrast. COMPARISON:  Non-contrast head CT and CT angiogram head/neck performed earlier today 11/07/2022. Brain MRI 05/18/2020. FINDINGS: Intermittently motion degraded examination, limiting evaluation. Most notably, there is moderate motion degradation of the axial T2 FLAIR sequence and moderate motion degradation of the coronal T2 sequence. Brain: Mild-to-moderate generalized cerebral atrophy. Small chronic cortical/subcortical infarcts within the bilateral parietal and left occipital lobes, new from the prior brain MRI of 05/18/2020. Chronic hemosiderin deposition and/or cortical laminar necrosis associated with the chronic infarct in the right parietal lobe. Background multifocal T2 FLAIR hyperintense signal abnormality within the cerebral white matter and pons, nonspecific but compatible with mild chronic small vessel ischemic disease. Chronic lacunar infarct within the left thalamus, new from the prior MRI. Punctate chronic microhemorrhage within the left  parietal lobe (series 14, image 29). Redemonstrated tiny chronic infarct within the left cerebellar hemisphere. Partially empty sella turcica. There is no acute infarct. No evidence of an intracranial mass. No extra-axial fluid collection. No midline shift. Vascular: Maintained flow voids within the proximal large arterial vessels. Skull and upper cervical spine: No focal suspicious marrow lesion. Incompletely assessed cervical spondylosis. Sinuses/Orbits: No mass or acute finding within the imaged orbits. No significant paranasal sinus disease. Other: Nonspecific 6 mm soft tissue lesion arising from the right pterional scalp. IMPRESSION: 1. Intermittently motion degraded examination. 2. No evidence of an acute intracranial abnormality. Specifically, the diffusion-weighted imaging is of good quality and there is no evidence of an acute infarct. 3. Small chronic cortical/subcortical infarcts within the bilateral parietal and left occipital lobes, new from the prior brain MRI of 05/18/2020. 4. Chronic lacunar infarct within the left thalamus, new from the prior MRI. 5. Background mild chronic small vessel ischemic changes within the cerebral white matter and pons. 6. Redemonstrated tiny chronic infarct within the left cerebellar hemisphere. 7. Mild-to-moderate generalized cerebral atrophy. 8. 6 mm soft tissue lesion arising from the right pterional scalp. Direct examination recommended. Electronically Signed   By: Jackey Loge D.O.   On: 11/07/2022 12:34   CT ANGIO HEAD NECK W WO CM  Result Date: 11/07/2022 CLINICAL DATA:  Right-sided gaze, slumped over EXAM: CT ANGIOGRAPHY HEAD AND NECK WITH AND WITHOUT CONTRAST TECHNIQUE: Multidetector CT imaging of the head and neck was performed using the standard protocol during bolus administration of intravenous contrast. Multiplanar CT image reconstructions and MIPs were obtained to evaluate the vascular anatomy. Carotid stenosis measurements (when applicable) are obtained  utilizing NASCET criteria, using the distal internal carotid diameter as the denominator. RADIATION DOSE REDUCTION: This exam was performed according to the departmental dose-optimization program which includes automated exposure control, adjustment of the mA and/or kV according to patient size and/or use of iterative reconstruction technique. CONTRAST:  75mL OMNIPAQUE IOHEXOL 350 MG/ML SOLN COMPARISON:  Same-day noncontrast CT head, carotid Doppler  01/26/2019, CTA head/neck 11/15/2018 FINDINGS: Image quality is degraded by motion artifact particularly at the level of the carotid bifurcations. CTA NECK FINDINGS Aortic arch: There is calcified plaque in the imaged aortic arch. The origins of the major branch vessels are patent. The subclavian arteries are patent to the level imaged. Right carotid system: The right common, internal, and external carotid arteries appear patent, without evidence of hemodynamically significant stenosis or occlusion there is no evidence of dissection or aneurysm, within the confines of above-described motion artifact. Left carotid system: The left common carotid artery is suboptimally evaluated due to motion and streak artifact from adjacent venous contrast. The bifurcation is also not well evaluated. The distal internal carotid artery in the neck is widely patent. There is no definite dissection or aneurysm, within the above-described confines. Vertebral arteries: There is mild stenosis of the origin of the right vertebral artery. The vertebral arteries are otherwise patent, without hemodynamically significant stenosis or occlusion. There is no evidence of dissection or aneurysm. Skeleton: There is multilevel disc space narrowing and degenerative endplate change of the cervical spine. There is no visible canal hematoma. Other neck: The soft tissues of the neck are unremarkable. Upper chest: The imaged lung apices are clear. Review of the MIP images confirms the above findings CTA HEAD  FINDINGS Anterior circulation: There is calcified plaque in the intracranial ICAs resulting in up to moderate to severe stenosis bilaterally. The bilateral MCAs and ACAs are patent, without proximal stenosis or occlusion. The anterior communicating artery is normal. There is no aneurysm or AVM. Posterior circulation: The bilateral V4 segments are patent with mild calcified plaque on the left. The basilar artery is patent. The major cerebellar arteries appear patent. The PCAs are patent, with short-segment moderate stenosis of the left P1/P2 segment (11-25). Posterior communicating arteries are not identified. There is no aneurysm or AVM. Venous sinuses: As permitted by contrast timing, patent. Anatomic variants: None. Review of the MIP images confirms the above findings IMPRESSION: 1. Image quality in the neck is degraded by motion artifact as well as streak artifact from venous contamination. Specifically, the left common carotid artery and carotid bifurcation are not well evaluated. The imaged left internal carotid artery in the neck is widely patent. 2. Patent right carotid system without hemodynamically significant stenosis or occlusion. Patent vertebral arteries with mild stenosis at the origin on the right. 3. Calcified plaque in the intracranial ICAs resulting in moderate to severe stenosis bilaterally, and short-segment moderate stenosis of the left P1/P2 segment. Electronically Signed   By: Lesia Hausen M.D.   On: 11/07/2022 11:43   CT HEAD WO CONTRAST ( )  Result Date: 11/07/2022 CLINICAL DATA:  Fall on blood thinners EXAM: CT HEAD WITHOUT CONTRAST TECHNIQUE: Contiguous axial images were obtained from the base of the skull through the vertex without intravenous contrast. RADIATION DOSE REDUCTION: This exam was performed according to the departmental dose-optimization program which includes automated exposure control, adjustment of the mA and/or kV according to patient size and/or use of iterative  reconstruction technique. COMPARISON:  05/17/2020 FINDINGS: Brain: No evidence of acute infarction, hemorrhage, hydrocephalus, extra-axial collection or mass lesion/mass effect. Periventricular and deep white matter hypodensity. New encephalomalacia of the right parietal vertex (4, image 20. Vascular: No hyperdense vessel or unexpected calcification. Skull: Normal. Negative for fracture or focal lesion. Sinuses/Orbits: No acute finding. Other: None. IMPRESSION: 1. No acute intracranial pathology. Small-vessel white matter disease. 2. New encephalomalacia of the right parietal vertex, consistent with interval, although nonacute infarction. Electronically  Signed   By: Jearld Lesch M.D.   On: 11/07/2022 10:55      Signature  -   Susa Raring M.D on 11/09/2022 at 8:02 AM   -  To page go to [email or web address]

## 2022-11-09 NOTE — Progress Notes (Signed)
Cardiology Progress Note  Patient ID: [name] MRN: [id number] DOB: [birth year] Date of Encounter: 11/09/2022  Primary Cardiologist: Norman Herrlich, MD  Subjective   Chief Complaint: None.   HPI: Telemetry unremarkable.  No further syncope.  Loop recorder in place.  Still not wanting to do left heart cath.  ROS:  All other ROS reviewed and negative. Pertinent positives noted in the HPI.     Inpatient Medications  Scheduled Meds:  acetaminophen  1,000 mg Oral TID   atorvastatin  80 mg Oral QHS   clopidogrel  75 mg Oral q morning   enoxaparin (LOVENOX) injection  40 mg Subcutaneous Q24H   ezetimibe  10 mg Oral q morning   fesoterodine  4 mg Oral Daily   gabapentin  300 mg Oral BID   insulin aspart  0-15 Units Subcutaneous TID WC   insulin glargine-yfgn  15 Units Subcutaneous Daily   rOPINIRole  1 mg Oral QHS   Continuous Infusions:  PRN Meds: hydrALAZINE, ondansetron **OR** ondansetron (ZOFRAN) IV   Vital Signs   Vitals:   11/08/22 1539 11/08/22 1929 11/08/22 2340 11/09/22 0331  BP: (!) 116/58 127/68 128/65 134/65  Pulse: 70 82 62 63  Resp: 18 18 15 15   Temp: 97.6 F (36.4 C) (!) 97.3 F (36.3 C) 97.7 F (36.5 C) (!) 96.6 F (35.9 C)  TempSrc: Oral Oral Axillary Axillary  SpO2: 95% 97% 98% 96%   No intake or output data in the 24 hours ending 11/09/22 0802    10/24/2022   11:21 AM 07/21/2022    8:52 AM 01/20/2022    9:34 AM  Last 3 Weights  Weight (lbs) 162 lb 160 lb 176 lb  Weight (kg) 73.483 kg 72.576 kg 79.833 kg      Telemetry  Overnight telemetry shows SR 60s, which I personally reviewed.    Physical Exam   Vitals:   11/08/22 1539 11/08/22 1929 11/08/22 2340 11/09/22 0331  BP: (!) 116/58 127/68 128/65 134/65  Pulse: 70 82 62 63  Resp: 18 18 15 15   Temp: 97.6 F (36.4 C) (!) 97.3 F (36.3 C) 97.7 F (36.5 C) (!) 96.6 F (35.9 C)  TempSrc: Oral Oral Axillary Axillary  SpO2: 95% 97% 98% 96%   No intake or output data in the 24 hours ending  11/09/22 0802     10/24/2022   11:21 AM 07/21/2022    8:52 AM 01/20/2022    9:34 AM  Last 3 Weights  Weight (lbs) 162 lb 160 lb 176 lb  Weight (kg) 73.483 kg 72.576 kg 79.833 kg    There is no height or weight on file to calculate BMI.  General: Well nourished, well developed, in no acute distress Head: Atraumatic, normal size  Eyes: PEERLA, EOMI  Neck: Supple, no JVD Endocrine: No thryomegaly Cardiac: Normal S1, S2; RRR; no murmurs, rubs, or gallops Lungs: Clear to auscultation bilaterally, no wheezing, rhonchi or rales  Abd: Soft, nontender, no hepatomegaly  Ext: No edema, pulses 2+ Musculoskeletal: No deformities, BUE and BLE strength normal and equal Skin: Warm and dry, no rashes   Neuro: Alert and oriented to person, place, time, and situation, CNII-XII grossly intact, no focal deficits  Psych: Normal mood and affect   Labs  High Sensitivity Troponin:  No results for input(s): "TROPONINIHS" in the last 720 hours.   Cardiac EnzymesNo results for input(s): "TROPONINI" in the last 168 hours. No results for input(s): "TROPIPOC" in the last 168 hours.  Chemistry Recent Labs  Lab 11/07/22 1050 11/08/22 0314 11/09/22 0141  NA 140 137 137  K 3.4* 3.6 4.0  CL 105 99 101  CO2 26 31 27   GLUCOSE 66* 55* 100*  BUN 10 11 17   CREATININE 1.02* 0.98 1.07*  CALCIUM 8.5* 8.7* 8.7*  PROT 6.5  --   --   ALBUMIN 3.0*  --   --   AST 23  --   --   ALT 15  --   --   ALKPHOS 71  --   --   BILITOT 0.3  --   --   GFRNONAA 57* 60* 54*  ANIONGAP 9 7 9     Hematology Recent Labs  Lab 11/07/22 1050 11/08/22 0314 11/09/22 0141  WBC 12.8* 11.2* 9.7  RBC 4.72 4.91 5.39*  HGB 12.6 13.2 14.6  HCT 41.2 41.5 46.2*  MCV 87.3 84.5 85.7  MCH 26.7 26.9 27.1  MCHC 30.6 31.8 31.6  RDW 15.2 15.6* 15.9*  PLT 293 288 275   BNPNo results for input(s): "BNP", "PROBNP" in the last 168 hours.  DDimer No results for input(s): "DDIMER" in the last 168 hours.   Radiology  ECHOCARDIOGRAM  COMPLETE  Result Date: 11/08/2022    ECHOCARDIOGRAM REPORT   Patient Name:   [name] Date of Exam: 11/08/2022 Medical Rec #:  [id number]    Height:       63.0 in Accession #:    [id number]   Weight:       162.0 lb Date of Birth:  26-Sep-1945    BSA:          1.768 m Patient Age:    77 years     BP:           111/59 mmHg Patient Gender: F            HR:           75 bpm. Exam Location:  Inpatient Procedure: 2D Echo, Cardiac Doppler and Color Doppler Indications:    syncope  History:        Patient has prior history of Echocardiogram examinations, most                 recent 11/16/2018. COPD; Risk Factors:Hypertension, Dyslipidemia                 and Diabetes.  Sonographer:    Delcie Roch RDCS Referring Phys: [id number] CHRISTOPHER P DANFORD IMPRESSIONS  1. Left ventricular ejection fraction, by estimation, is 65 to 70%. The left ventricle has normal function. The left ventricle has no regional wall motion abnormalities. There is mild left ventricular hypertrophy. Left ventricular diastolic parameters are consistent with Grade II diastolic dysfunction (pseudonormalization). Elevated left atrial pressure.  2. Right ventricular systolic function is normal. The right ventricular size is normal. There is normal pulmonary artery systolic pressure. The estimated right ventricular systolic pressure is 26.6 mmHg.  3. The mitral valve is degenerative. Trivial mitral valve regurgitation. Mild mitral stenosis. The mean mitral valve gradient is 4.0 mmHg with average heart rate of 73 bpm. Severe mitral annular calcification.  4. The aortic valve is tricuspid. Aortic valve regurgitation is not visualized. Aortic valve sclerosis/calcification is present, without any evidence of aortic stenosis.  5. The inferior vena cava is normal in size with greater than 50% respiratory variability, suggesting right atrial pressure of 3 mmHg. FINDINGS  Left Ventricle: Left ventricular ejection fraction, by estimation, is 65 to 70%. The left  ventricle has normal function. The left  ventricle has no regional wall motion abnormalities. The left ventricular internal cavity size was normal in size. There is  mild left ventricular hypertrophy. Left ventricular diastolic parameters are consistent with Grade II diastolic dysfunction (pseudonormalization). Elevated left atrial pressure. Right Ventricle: The right ventricular size is normal. No increase in right ventricular wall thickness. Right ventricular systolic function is normal. There is normal pulmonary artery systolic pressure. The tricuspid regurgitant velocity is 2.43 m/s, and  with an assumed right atrial pressure of 3 mmHg, the estimated right ventricular systolic pressure is 26.6 mmHg. Left Atrium: Left atrial size was normal in size. Right Atrium: Right atrial size was normal in size. Pericardium: There is no evidence of pericardial effusion. Mitral Valve: The mitral valve is degenerative in appearance. Severe mitral annular calcification. Trivial mitral valve regurgitation. Mild mitral valve stenosis. MV peak gradient, 9.5 mmHg. The mean mitral valve gradient is 4.0 mmHg with average heart rate of 73 bpm. Tricuspid Valve: The tricuspid valve is normal in structure. Tricuspid valve regurgitation is trivial. Aortic Valve: The aortic valve is tricuspid. Aortic valve regurgitation is not visualized. Aortic valve sclerosis/calcification is present, without any evidence of aortic stenosis. Pulmonic Valve: The pulmonic valve was grossly normal. Pulmonic valve regurgitation is not visualized. Aorta: The aortic root and ascending aorta are structurally normal, with no evidence of dilitation. Venous: The inferior vena cava is normal in size with greater than 50% respiratory variability, suggesting right atrial pressure of 3 mmHg. IAS/Shunts: The interatrial septum was not well visualized.  LEFT VENTRICLE PLAX 2D LVIDd:         4.70 cm   Diastology LVIDs:         3.00 cm   LV e' medial:    6.09 cm/s LV PW:          1.10 cm   LV E/e' medial:  20.4 LV IVS:        1.10 cm   LV e' lateral:   6.53 cm/s LVOT diam:     1.60 cm   LV E/e' lateral: 19.0 LV SV:         43 LV SV Index:   24 LVOT Area:     2.01 cm  RIGHT VENTRICLE             IVC RV Basal diam:  2.50 cm     IVC diam: 1.70 cm RV S prime:     13.60 cm/s TAPSE (M-mode): 2.0 cm LEFT ATRIUM             Index        RIGHT ATRIUM           Index LA diam:        3.70 cm 2.09 cm/m   RA Area:     12.10 cm LA Vol (A2C):   63.0 ml 35.63 ml/m  RA Volume:   24.20 ml  13.69 ml/m LA Vol (A4C):   44.3 ml 25.06 ml/m LA Biplane Vol: 52.0 ml 29.41 ml/m  AORTIC VALVE LVOT Vmax:   104.00 cm/s LVOT Vmean:  67.700 cm/s LVOT VTI:    0.214 m  AORTA Ao Root diam: 2.40 cm Ao Asc diam:  3.30 cm MITRAL VALVE                TRICUSPID VALVE MV Area (PHT): 2.95 cm     TR Peak grad:   23.6 mmHg MV Area VTI:   1.07 cm     TR Vmax:  243.00 cm/s MV Peak grad:  9.5 mmHg MV Mean grad:  4.0 mmHg     SHUNTS MV Vmax:       1.54 m/s     Systemic VTI:  0.21 m MV Vmean:      81.3 cm/s    Systemic Diam: 1.60 cm MV Decel Time: 257 msec MV E velocity: 124.00 cm/s MV A velocity: 106.00 cm/s MV E/A ratio:  1.17 Epifanio Lesches MD Electronically signed by Epifanio Lesches MD Signature Date/Time: 11/08/2022/4:03:43 PM    Final    EP PPM/ICD IMPLANT  Result Date: 11/08/2022 SURGEON:  Francis Dowse, PA Assistant: Will Jorja Loa, MD   PREPROCEDURE DIAGNOSIS:  Syncope   POSTPROCEDURE DIAGNOSIS: Syncope    PROCEDURES:  1. Implantable loop recorder implantation   INTRODUCTION:  [name] presents with a history of syncope The costs of loop recorder monitoring have been discussed with the patient. Appropriate time out was performed prior to the procedure.   DESCRIPTION OF PROCEDURE:  Informed written consent was obtained.  The patient required no sedation for the procedure today.  Mapping over the patient's chest was performed to identify the area where electrograms were most prominent for ILR  recording.  This area was found to be the left parasternal region over the 4th intercostal space. The patients left chest was therefore prepped and draped in the usual sterile fashion. The skin overlying the left parasternal region was infiltrated with lidocaine for local analgesia.  A 0.5-cm incision was made over the left parasternal region over the 3rd intercostal space.  A subcutaneous ILR pocket was fashioned using a combination of sharp and blunt dissection.  A [employer] Reveal LINQ 2 (serial # [id number]) implantable loop recorder was then placed into the pocket  R waves were very prominent and measured 0.68mV.  Steri- Strips and a sterile dressing were then applied.  There were no early apparent complications.   Will Camnitz was present during the entirety of the case for proctoring and supervision. CONCLUSIONS:  1. Successful implantation of a implantable loop recorder for a history of syncope  2. No early apparent complications. Will Jorja Loa, MD 11/08/2022 2:32 PM   EEG adult  Result Date: 11/08/2022 Charlsie Quest, MD     11/08/2022  1:30 PM Patient Name: [name] MRN: [id number] Epilepsy Attending: Charlsie Quest Referring Physician/Provider: Leroy Sea, MD Date: 11/08/2022 Duration: 22.46 mins Patient history: 77yo F with syncope. EEG to evaluate for seizure. Level of alertness: Awake AEDs during EEG study: GBP Technical aspects: This EEG study was done with scalp electrodes positioned according to the 10-20 International system of electrode placement. Electrical activity was reviewed with band pass filter of 1-70Hz , sensitivity of 7 uV/mm, display speed of 68mm/sec with a 60Hz  notched filter applied as appropriate. EEG data were recorded continuously and digitally stored.  Video monitoring was available and reviewed as appropriate. Description: The posterior dominant rhythm consists of 6 Hz activity of moderate voltage (25-35 uV) seen predominantly in posterior head regions,  symmetric and reactive to eye opening and eye closing. EEG showed continuous generalized 5 to 7 Hz theta slowing. Hyperventilation and photic stimulation were not performed.   ABNORMALITY - Continuous slow, generalized - Background slow IMPRESSION: This study is suggestive of mild to moderate diffuse encephalopathy, nonspecific etiology. No seizures or epileptiform discharges were seen throughout the recording. Charlsie Quest   DG Ribs Unilateral W/Chest Right  Result Date: 11/07/2022 CLINICAL DATA:  Status post fall with right  lateral/anterior rib pain EXAM: RIGHT RIBS AND CHEST - 3 VIEW COMPARISON:  Chest radiograph dated 03/26/2022 FINDINGS: Normal lung volumes. No focal consolidations. No pneumothorax. Focal pleural thickening along the lower right lateral lung adjacent to the rib fractures. The heart size and mediastinal contours are within normal limits. Displaced fractures of the right lateral seventh through ninth ribs. IMPRESSION: 1. Displaced fractures of the right lateral seventh through ninth ribs. 2. Focal pleural thickening along the lower right lateral lung adjacent to the rib fractures, likely a small pleural effusion. No pneumothorax. Electronically Signed   By: Agustin Cree M.D.   On: 11/07/2022 14:02   MR BRAIN WO CONTRAST  Result Date: 11/07/2022 CLINICAL DATA:  Provided history: Syncope/presyncope, cerebrovascular cause suspected. Right-sided weakness/pain. EXAM: MRI HEAD WITHOUT CONTRAST TECHNIQUE: Multiplanar, multiecho pulse sequences of the brain and surrounding structures were obtained without intravenous contrast. COMPARISON:  Non-contrast head CT and CT angiogram head/neck performed earlier today 11/07/2022. Brain MRI 05/18/2020. FINDINGS: Intermittently motion degraded examination, limiting evaluation. Most notably, there is moderate motion degradation of the axial T2 FLAIR sequence and moderate motion degradation of the coronal T2 sequence. Brain: Mild-to-moderate generalized  cerebral atrophy. Small chronic cortical/subcortical infarcts within the bilateral parietal and left occipital lobes, new from the prior brain MRI of 05/18/2020. Chronic hemosiderin deposition and/or cortical laminar necrosis associated with the chronic infarct in the right parietal lobe. Background multifocal T2 FLAIR hyperintense signal abnormality within the cerebral white matter and pons, nonspecific but compatible with mild chronic small vessel ischemic disease. Chronic lacunar infarct within the left thalamus, new from the prior MRI. Punctate chronic microhemorrhage within the left parietal lobe (series 14, image 29). Redemonstrated tiny chronic infarct within the left cerebellar hemisphere. Partially empty sella turcica. There is no acute infarct. No evidence of an intracranial mass. No extra-axial fluid collection. No midline shift. Vascular: Maintained flow voids within the proximal large arterial vessels. Skull and upper cervical spine: No focal suspicious marrow lesion. Incompletely assessed cervical spondylosis. Sinuses/Orbits: No mass or acute finding within the imaged orbits. No significant paranasal sinus disease. Other: Nonspecific 6 mm soft tissue lesion arising from the right pterional scalp. IMPRESSION: 1. Intermittently motion degraded examination. 2. No evidence of an acute intracranial abnormality. Specifically, the diffusion-weighted imaging is of good quality and there is no evidence of an acute infarct. 3. Small chronic cortical/subcortical infarcts within the bilateral parietal and left occipital lobes, new from the prior brain MRI of 05/18/2020. 4. Chronic lacunar infarct within the left thalamus, new from the prior MRI. 5. Background mild chronic small vessel ischemic changes within the cerebral white matter and pons. 6. Redemonstrated tiny chronic infarct within the left cerebellar hemisphere. 7. Mild-to-moderate generalized cerebral atrophy. 8. 6 mm soft tissue lesion arising from the  right pterional scalp. Direct examination recommended. Electronically Signed   By: Jackey Loge D.O.   On: 11/07/2022 12:34   CT ANGIO HEAD NECK W WO CM  Result Date: 11/07/2022 CLINICAL DATA:  Right-sided gaze, slumped over EXAM: CT ANGIOGRAPHY HEAD AND NECK WITH AND WITHOUT CONTRAST TECHNIQUE: Multidetector CT imaging of the head and neck was performed using the standard protocol during bolus administration of intravenous contrast. Multiplanar CT image reconstructions and MIPs were obtained to evaluate the vascular anatomy. Carotid stenosis measurements (when applicable) are obtained utilizing NASCET criteria, using the distal internal carotid diameter as the denominator. RADIATION DOSE REDUCTION: This exam was performed according to the departmental dose-optimization program which includes automated exposure control, adjustment of the mA and/or  kV according to patient size and/or use of iterative reconstruction technique. CONTRAST:  75mL OMNIPAQUE IOHEXOL 350 MG/ML SOLN COMPARISON:  Same-day noncontrast CT head, carotid Doppler 01/26/2019, CTA head/neck 11/15/2018 FINDINGS: Image quality is degraded by motion artifact particularly at the level of the carotid bifurcations. CTA NECK FINDINGS Aortic arch: There is calcified plaque in the imaged aortic arch. The origins of the major branch vessels are patent. The subclavian arteries are patent to the level imaged. Right carotid system: The right common, internal, and external carotid arteries appear patent, without evidence of hemodynamically significant stenosis or occlusion there is no evidence of dissection or aneurysm, within the confines of above-described motion artifact. Left carotid system: The left common carotid artery is suboptimally evaluated due to motion and streak artifact from adjacent venous contrast. The bifurcation is also not well evaluated. The distal internal carotid artery in the neck is widely patent. There is no definite dissection or  aneurysm, within the above-described confines. Vertebral arteries: There is mild stenosis of the origin of the right vertebral artery. The vertebral arteries are otherwise patent, without hemodynamically significant stenosis or occlusion. There is no evidence of dissection or aneurysm. Skeleton: There is multilevel disc space narrowing and degenerative endplate change of the cervical spine. There is no visible canal hematoma. Other neck: The soft tissues of the neck are unremarkable. Upper chest: The imaged lung apices are clear. Review of the MIP images confirms the above findings CTA HEAD FINDINGS Anterior circulation: There is calcified plaque in the intracranial ICAs resulting in up to moderate to severe stenosis bilaterally. The bilateral MCAs and ACAs are patent, without proximal stenosis or occlusion. The anterior communicating artery is normal. There is no aneurysm or AVM. Posterior circulation: The bilateral V4 segments are patent with mild calcified plaque on the left. The basilar artery is patent. The major cerebellar arteries appear patent. The PCAs are patent, with short-segment moderate stenosis of the left P1/P2 segment (11-25). Posterior communicating arteries are not identified. There is no aneurysm or AVM. Venous sinuses: As permitted by contrast timing, patent. Anatomic variants: None. Review of the MIP images confirms the above findings IMPRESSION: 1. Image quality in the neck is degraded by motion artifact as well as streak artifact from venous contamination. Specifically, the left common carotid artery and carotid bifurcation are not well evaluated. The imaged left internal carotid artery in the neck is widely patent. 2. Patent right carotid system without hemodynamically significant stenosis or occlusion. Patent vertebral arteries with mild stenosis at the origin on the right. 3. Calcified plaque in the intracranial ICAs resulting in moderate to severe stenosis bilaterally, and short-segment  moderate stenosis of the left P1/P2 segment. Electronically Signed   By: Lesia Hausen M.D.   On: 11/07/2022 11:43   CT HEAD WO CONTRAST ( )  Result Date: 11/07/2022 CLINICAL DATA:  Fall on blood thinners EXAM: CT HEAD WITHOUT CONTRAST TECHNIQUE: Contiguous axial images were obtained from the base of the skull through the vertex without intravenous contrast. RADIATION DOSE REDUCTION: This exam was performed according to the departmental dose-optimization program which includes automated exposure control, adjustment of the mA and/or kV according to patient size and/or use of iterative reconstruction technique. COMPARISON:  05/17/2020 FINDINGS: Brain: No evidence of acute infarction, hemorrhage, hydrocephalus, extra-axial collection or mass lesion/mass effect. Periventricular and deep white matter hypodensity. New encephalomalacia of the right parietal vertex (4, image 20. Vascular: No hyperdense vessel or unexpected calcification. Skull: Normal. Negative for fracture or focal lesion. Sinuses/Orbits: No acute finding.  Other: None. IMPRESSION: 1. No acute intracranial pathology. Small-vessel white matter disease. 2. New encephalomalacia of the right parietal vertex, consistent with interval, although nonacute infarction. Electronically Signed   By: Jearld Lesch M.D.   On: 11/07/2022 10:55    Cardiac Studies  TTE 11/08/2022  1. Left ventricular ejection fraction, by estimation, is 65 to 70%. The  left ventricle has normal function. The left ventricle has no regional  wall motion abnormalities. There is mild left ventricular hypertrophy.  Left ventricular diastolic parameters  are consistent with Grade II diastolic dysfunction (pseudonormalization).  Elevated left atrial pressure.   2. Right ventricular systolic function is normal. The right ventricular  size is normal. There is normal pulmonary artery systolic pressure. The  estimated right ventricular systolic pressure is 26.6 mmHg.   3. The mitral  valve is degenerative. Trivial mitral valve regurgitation.  Mild mitral stenosis. The mean mitral valve gradient is 4.0 mmHg with  average heart rate of 73 bpm. Severe mitral annular calcification.   4. The aortic valve is tricuspid. Aortic valve regurgitation is not  visualized. Aortic valve sclerosis/calcification is present, without any  evidence of aortic stenosis.   5. The inferior vena cava is normal in size with greater than 50%  respiratory variability, suggesting right atrial pressure of 3 mmHg.   Patient Profile  [name] is a 77 y.o. female with CAD, diabetes, hypertension, CVA, carotid artery disease status post CEA, COPD, HFpEF admitted on 11/07/2022 for syncope.   Assessment & Plan   # Syncope #Diffuse small vessel cerebrovascular disease #Moderate to severe intracranial ICA stenoses bilaterally # CAD # Diabetes # HFpEF -Admitted with recurrent syncopal events.  No real prodrome.  Echo was normal.  Telemetry is stable.  Loop recorder placed by EP.  She does have severe coronary calcifications but is not wanting to do a left heart cath.  She reports no chest pains or trouble breathing prior to the episodes.  These episodes appear to be brief.  I do question hypoglycemia.  Adjustment of diabetes medications per hospital medicine.  She also has severe small vessel cerebrovascular disease as well as concerns for moderate to severe stenoses of the intracranial internal carotid arteries.  This could be contributing.  This could just be orthostasis or multifactorial due to the above problems.  We discussed left heart cath but she is not wanting to do this.  Her echo is normal.  EKG is stable.  From my standpoint she can be discharged.  EP will follow the loop recorder.  She can continue her home cardiac medications as prescribed.  She has an appointment with Dr. Dulce Sellar on 12/05/2022.  [facility] will sign off.   Medication Recommendations: As above Other recommendations  (labs, testing, etc): None Follow up as an outpatient: 12/05/2022 with Dr. Dulce Sellar  For questions or updates, please contact [facility] Please consult [email or web address] for contact info under        Signed, Gerri Spore T. Flora Lipps, MD, [facility]  [facility]  11/09/2022 8:02 AM

## 2022-11-10 ENCOUNTER — Telehealth: Payer: Self-pay

## 2022-11-10 ENCOUNTER — Encounter (HOSPITAL_COMMUNITY): Payer: Self-pay | Admitting: Cardiology

## 2022-11-10 DIAGNOSIS — Z7984 Long term (current) use of oral hypoglycemic drugs: Secondary | ICD-10-CM | POA: Diagnosis not present

## 2022-11-10 DIAGNOSIS — I69392 Facial weakness following cerebral infarction: Secondary | ICD-10-CM | POA: Diagnosis not present

## 2022-11-10 DIAGNOSIS — R55 Syncope and collapse: Secondary | ICD-10-CM | POA: Diagnosis not present

## 2022-11-10 DIAGNOSIS — J42 Unspecified chronic bronchitis: Secondary | ICD-10-CM | POA: Diagnosis not present

## 2022-11-10 DIAGNOSIS — Z8679 Personal history of other diseases of the circulatory system: Secondary | ICD-10-CM | POA: Diagnosis not present

## 2022-11-10 DIAGNOSIS — R2681 Unsteadiness on feet: Secondary | ICD-10-CM | POA: Diagnosis not present

## 2022-11-10 DIAGNOSIS — L24A9 Irritant contact dermatitis due friction or contact with other specified body fluids: Secondary | ICD-10-CM | POA: Diagnosis not present

## 2022-11-10 DIAGNOSIS — H35373 Puckering of macula, bilateral: Secondary | ICD-10-CM | POA: Diagnosis not present

## 2022-11-10 DIAGNOSIS — M6281 Muscle weakness (generalized): Secondary | ICD-10-CM | POA: Diagnosis not present

## 2022-11-10 DIAGNOSIS — Z7401 Bed confinement status: Secondary | ICD-10-CM | POA: Diagnosis not present

## 2022-11-10 DIAGNOSIS — H52223 Regular astigmatism, bilateral: Secondary | ICD-10-CM | POA: Diagnosis not present

## 2022-11-10 DIAGNOSIS — Z794 Long term (current) use of insulin: Secondary | ICD-10-CM | POA: Diagnosis not present

## 2022-11-10 DIAGNOSIS — I5032 Chronic diastolic (congestive) heart failure: Secondary | ICD-10-CM | POA: Diagnosis not present

## 2022-11-10 DIAGNOSIS — R52 Pain, unspecified: Secondary | ICD-10-CM | POA: Diagnosis not present

## 2022-11-10 DIAGNOSIS — Z9181 History of falling: Secondary | ICD-10-CM | POA: Diagnosis not present

## 2022-11-10 DIAGNOSIS — H35341 Macular cyst, hole, or pseudohole, right eye: Secondary | ICD-10-CM | POA: Diagnosis not present

## 2022-11-10 DIAGNOSIS — H5203 Hypermetropia, bilateral: Secondary | ICD-10-CM | POA: Diagnosis not present

## 2022-11-10 DIAGNOSIS — Z8659 Personal history of other mental and behavioral disorders: Secondary | ICD-10-CM | POA: Diagnosis not present

## 2022-11-10 DIAGNOSIS — E1165 Type 2 diabetes mellitus with hyperglycemia: Secondary | ICD-10-CM | POA: Diagnosis not present

## 2022-11-10 DIAGNOSIS — Z7189 Other specified counseling: Secondary | ICD-10-CM | POA: Diagnosis not present

## 2022-11-10 DIAGNOSIS — G2581 Restless legs syndrome: Secondary | ICD-10-CM | POA: Diagnosis not present

## 2022-11-10 DIAGNOSIS — Z7902 Long term (current) use of antithrombotics/antiplatelets: Secondary | ICD-10-CM | POA: Diagnosis not present

## 2022-11-10 DIAGNOSIS — I6521 Occlusion and stenosis of right carotid artery: Secondary | ICD-10-CM | POA: Diagnosis not present

## 2022-11-10 DIAGNOSIS — I509 Heart failure, unspecified: Secondary | ICD-10-CM | POA: Diagnosis not present

## 2022-11-10 DIAGNOSIS — I679 Cerebrovascular disease, unspecified: Secondary | ICD-10-CM | POA: Diagnosis not present

## 2022-11-10 DIAGNOSIS — H25813 Combined forms of age-related cataract, bilateral: Secondary | ICD-10-CM | POA: Diagnosis not present

## 2022-11-10 DIAGNOSIS — Z95818 Presence of other cardiac implants and grafts: Secondary | ICD-10-CM | POA: Diagnosis not present

## 2022-11-10 DIAGNOSIS — H35371 Puckering of macula, right eye: Secondary | ICD-10-CM | POA: Diagnosis not present

## 2022-11-10 DIAGNOSIS — I69354 Hemiplegia and hemiparesis following cerebral infarction affecting left non-dominant side: Secondary | ICD-10-CM | POA: Diagnosis not present

## 2022-11-10 DIAGNOSIS — R32 Unspecified urinary incontinence: Secondary | ICD-10-CM | POA: Diagnosis not present

## 2022-11-10 DIAGNOSIS — R41841 Cognitive communication deficit: Secondary | ICD-10-CM | POA: Diagnosis not present

## 2022-11-10 DIAGNOSIS — E11649 Type 2 diabetes mellitus with hypoglycemia without coma: Secondary | ICD-10-CM | POA: Diagnosis not present

## 2022-11-10 DIAGNOSIS — E1142 Type 2 diabetes mellitus with diabetic polyneuropathy: Secondary | ICD-10-CM | POA: Diagnosis not present

## 2022-11-10 DIAGNOSIS — J449 Chronic obstructive pulmonary disease, unspecified: Secondary | ICD-10-CM | POA: Diagnosis not present

## 2022-11-10 DIAGNOSIS — M4696 Unspecified inflammatory spondylopathy, lumbar region: Secondary | ICD-10-CM | POA: Diagnosis not present

## 2022-11-10 DIAGNOSIS — R7989 Other specified abnormal findings of blood chemistry: Secondary | ICD-10-CM | POA: Diagnosis not present

## 2022-11-10 DIAGNOSIS — F4321 Adjustment disorder with depressed mood: Secondary | ICD-10-CM | POA: Diagnosis not present

## 2022-11-10 DIAGNOSIS — I779 Disorder of arteries and arterioles, unspecified: Secondary | ICD-10-CM | POA: Diagnosis not present

## 2022-11-10 DIAGNOSIS — R296 Repeated falls: Secondary | ICD-10-CM | POA: Diagnosis not present

## 2022-11-10 DIAGNOSIS — R443 Hallucinations, unspecified: Secondary | ICD-10-CM | POA: Diagnosis not present

## 2022-11-10 DIAGNOSIS — E785 Hyperlipidemia, unspecified: Secondary | ICD-10-CM | POA: Diagnosis not present

## 2022-11-10 DIAGNOSIS — M545 Low back pain, unspecified: Secondary | ICD-10-CM | POA: Diagnosis not present

## 2022-11-10 DIAGNOSIS — I69351 Hemiplegia and hemiparesis following cerebral infarction affecting right dominant side: Secondary | ICD-10-CM | POA: Diagnosis not present

## 2022-11-10 DIAGNOSIS — Z66 Do not resuscitate: Secondary | ICD-10-CM | POA: Diagnosis not present

## 2022-11-10 DIAGNOSIS — I10 Essential (primary) hypertension: Secondary | ICD-10-CM | POA: Diagnosis not present

## 2022-11-10 DIAGNOSIS — I890 Lymphedema, not elsewhere classified: Secondary | ICD-10-CM | POA: Diagnosis not present

## 2022-11-10 DIAGNOSIS — K5900 Constipation, unspecified: Secondary | ICD-10-CM | POA: Diagnosis not present

## 2022-11-10 DIAGNOSIS — I110 Hypertensive heart disease with heart failure: Secondary | ICD-10-CM | POA: Diagnosis not present

## 2022-11-10 DIAGNOSIS — H524 Presbyopia: Secondary | ICD-10-CM | POA: Diagnosis not present

## 2022-11-10 DIAGNOSIS — Z741 Need for assistance with personal care: Secondary | ICD-10-CM | POA: Diagnosis not present

## 2022-11-10 DIAGNOSIS — E119 Type 2 diabetes mellitus without complications: Secondary | ICD-10-CM | POA: Diagnosis not present

## 2022-11-10 DIAGNOSIS — R531 Weakness: Secondary | ICD-10-CM | POA: Diagnosis not present

## 2022-11-10 DIAGNOSIS — I69331 Monoplegia of upper limb following cerebral infarction affecting right dominant side: Secondary | ICD-10-CM | POA: Diagnosis not present

## 2022-11-10 DIAGNOSIS — I959 Hypotension, unspecified: Secondary | ICD-10-CM | POA: Diagnosis not present

## 2022-11-10 DIAGNOSIS — L89309 Pressure ulcer of unspecified buttock, unspecified stage: Secondary | ICD-10-CM | POA: Diagnosis not present

## 2022-11-10 DIAGNOSIS — S2249XD Multiple fractures of ribs, unspecified side, subsequent encounter for fracture with routine healing: Secondary | ICD-10-CM | POA: Diagnosis not present

## 2022-11-10 LAB — GLUCOSE, CAPILLARY
Glucose-Capillary: 99 mg/dL (ref 70–99)
Glucose-Capillary: 140 mg/dL — ABNORMAL HIGH (ref 70–99)

## 2022-11-10 MED ORDER — HUMALOG 100 UNIT/ML IJ SOLN: INTRAMUSCULAR | 0 refills | Status: DC

## 2022-11-10 MED ORDER — INSULIN GLARGINE 100 UNIT/ML ~~LOC~~ SOLN: 15.0000 [IU] | Freq: Two times a day (BID) | SUBCUTANEOUS | 0 refills | Status: DC

## 2022-11-10 NOTE — Telephone Encounter (Signed)
Patient is still in the hospital 

## 2022-11-10 NOTE — TOC Transition Note (Addendum)
Transition of Care [facility]) - CM/SW Discharge Note   Patient Details  Name: [name] MRN: [id number] Date of Birth: [birth year]  Transition of Care [facility]) CM/SW Contact:  Mearl Latin, LCSW Phone Number: 11/10/2022, 1:23 PM   Clinical Narrative:    Patient will DC to: [facility]   Anticipated DC date: 11/10/22 Family notified: Son, JT (Pt notified spouse), and friend Catering manager by: Sharin Mons   Per MD patient ready for DC to [place]. RN to call report prior to discharge [phone] room 601p). RN, patient, patient's family, and facility notified of DC. Discharge Summary and FL2 sent to facility. DC packet on chart including signed DNR. Ambulance transport requested for patient.   CSW will sign off for now as social work intervention is no longer needed. Please consult [place] again if new needs arise.     Final next level of care: Skilled Nursing Facility Barriers to Discharge: Barriers Resolved   Patient Goals and CMS Choice CMS Medicare.gov Compare Post Acute Care list provided to:: Other (Comment Required) (dispo TBD) Choice offered to / list presented to : NA  Discharge Placement     Existing PASRR number confirmed : 11/10/22          Patient chooses bed at: [facility] Patient to be transferred to facility by: [facility] Name of family member notified: Son Patient and family notified of of transfer: 11/10/22  Discharge Plan and Services Additional resources added to the After Visit Summary for     Discharge Planning Services: NA Post Acute Care Choice: Skilled Nursing Facility (Patient desires SNF)          DME Arranged: N/A (Already has a cane, RW , BSC, shower chair and wheelchair) DME Agency: [employer]       HH Arranged: NA HH Agency: NA        Social Determinants of Health (SDOH) Interventions SDOH Screenings   Food Insecurity: No Food Insecurity (11/08/2022)  Housing: Low Risk  (11/08/2022)  Transportation Needs: No Transportation Needs (11/08/2022)   Utilities: Not At Risk (11/08/2022)  Alcohol Screen: Low Risk  (12/25/2018)  Depression (PHQ2-9): Low Risk  (12/25/2018)  Financial Resource Strain: Low Risk  (12/20/2018)  Physical Activity: Inactive (12/20/2018)  Social Connections: Moderately Integrated (12/25/2018)  Stress: Stress Concern Present (12/20/2018)  Tobacco Use: Low Risk  (11/10/2022)     Readmission Risk Interventions     No data to display

## 2022-11-10 NOTE — Discharge Planning (Signed)
AIDSA OKAWA UEA:[id number] DOB: 06/04/1945 DOA: 11/07/2022  PCP: Paulina Fusi, MD  Admit date: 11/07/2022  Discharge date: 11/10/2022  Admitted From: Home   Disposition:  SNF   Recommendations for Outpatient Follow-up:   Follow up with PCP in 1-2 weeks  PCP Please obtain BMP/CBC, 2 view CXR in 1week,  (see Discharge instructions)   PCP Please follow up on the following pending results:    Home Health: None   Equipment/Devices: None  Consultations: Cards Discharge Condition: Stable    CODE STATUS: Full    Diet Recommendation: Heart Healthy Low Carb    Chief Complaint  Patient presents with   Fall     Brief history of present illness from the day of admission and additional interim summary    77 y.o. F with HTN, DM, hx CVA without residual deficits, dCHF, carotid PVD, and COPD who presented with syncope, she is a poor historian but says that she has been having episodes where she passes out for several minutes, these episodes have been happening once or twice a month for the last several months. According to the husband she went to a funeral where she had passed out for at least 15 minutes, no bowel or bladder incontinence, no chest pain, came to the ER where she underwent CT head which was nonacute she was admitted for further care. Of note patient was recently seen by cardiology and given a Zio patch which per prelim evaluation appears stable.   Workup here was consistent with episodes of hypoglycemia and hypotension, which could be the reason for her syncope at home.                                                                 Hospital Course   Recurrent episodes of Syncope - Patient's ability to articulate her symptoms is limited, husband pretty forgetful as well.  No clear reason for recurrent  syncope, she had borderline low blood pressures and low CBGs upon arrival, I think these 2 are the main culprits for her blackout episodes, she also was found to have  bilateral intracranial carotid artery disease for which medical treatment is recommended by vascular surgery, her Zio patch evaluation was stable, she has received a loop recorder here.  Echocardiogram stable, seen by cardiology as well.  With adjustment of her blood pressure medications and diabetic medications adjustment her CBGs and blood pressure are running great and she is completely symptom-free without any further blackout spells, underwent PT OT evaluation now being discharged to SNF for further care.    For her bilateral carotid artery disease.  Vascular surgery input, continue Plavix and statin for now.   Right 7-9th rib fractures due to fall at home.  Supportive care.   Hypertension  - Continue ARB, hydralazine, adjust  diuretics as needed.   Cerebrovascular disease - MRI brain.  Nonacute.  Continue Plavix, Zetia, Lipitor   Carotid stenosis, bilateral  On Plavix and statin, case discussed with vascular surgeon Dr. Myra Gianotti, no need for intervention as most of the lesions are intracranial.   COPD (chronic obstructive pulmonary disease) (HCC)  No flare   Hyperlipidemia - Continue Lipitor and Zetia   Chronic diastolic congestive heart failure (HCC)  Appears euvolemic.  Cardiology following.  Diuretics on hold.  Marcelline Deist to be discontinued upon discharge per cardiology.  She has received loop recorder here post discharge will follow-up with her cardiologist Dr. Dulce Sellar in 1 to 2 weeks.   Uncontrolled type 2 diabetes mellitus with hypoglycemia, with long-term current use of insulin (HCC) Discontinue glimepiride and Farxiga, insulin dose significantly reduced.  Continue to monitor CBGs at SNF and adjust insulin further as needed, keep her regimen simplified.    Discharge diagnosis     Principal Problem:   Syncope and  collapse Active Problems:   Right 7-9th rib fractures   Chronic diastolic congestive heart failure (HCC)   Hyperlipidemia   COPD (chronic obstructive pulmonary disease) (HCC)   Carotid stenosis, right   Uncontrolled type 2 diabetes mellitus with hypoglycemia, with long-term current use of insulin (HCC)   Cerebrovascular disease   Hypertension    Discharge instructions    Discharge Instructions     Discharge instructions   Complete by: As directed    Follow with Primary MD Paulina Fusi, MD in 7 days   Get CBC, CMP, 2 view Chest X ray, Magnesium -  checked next visit with your primary MD or SNF MD    Activity: As tolerated with Full fall precautions use walker/cane & assistance as needed  Disposition SNF  Diet: Heart Healthy low carbohydrate diet, check CBGs q. ACH S and adjust insulin further at SNF as needed.  Special Instructions: If you have smoked or chewed Tobacco  in the last 2 yrs please stop smoking, stop any regular Alcohol  and or any Recreational drug use.  On your next visit with your primary care physician please Get Medicines reviewed and adjusted.  Please request your Prim.MD to go over all Hospital Tests and Procedure/Radiological results at the follow up, please get all Hospital records sent to your Prim MD by signing hospital release before you go home.  If you experience worsening of your admission symptoms, develop shortness of breath, life threatening emergency, suicidal or homicidal thoughts you must seek medical attention immediately by calling 911 or calling your MD immediately  if symptoms less severe.  You Must read complete instructions/literature along with all the possible adverse reactions/side effects for all the Medicines you take and that have been prescribed to you. Take any new Medicines after you have completely understood and accpet all the possible adverse reactions/side effects.   Increase activity slowly   Complete by: As directed         Discharge Medications   Allergies as of 11/10/2022       Reactions   Alendronate    Other reaction(s): Other (See Comments) Makes me feel very weak   Valsartan Nausea And Vomiting        Medication List     STOP taking these medications    Farxiga 10 MG Tabs tablet Generic drug: dapagliflozin propanediol   glimepiride 4 MG tablet Commonly known as: AMARYL   hydrALAZINE 25 MG tablet Commonly known as: APRESOLINE   potassium chloride SA 20 MEQ  tablet Commonly known as: KLOR-CON M   valsartan 320 MG tablet Commonly known as: DIOVAN       TAKE these medications    atorvastatin 80 MG tablet Commonly known as: LIPITOR Take 80 mg by mouth at bedtime.   clopidogrel 75 MG tablet Commonly known as: PLAVIX Take 75 mg by mouth every morning.   ezetimibe 10 MG tablet Commonly known as: ZETIA Take 10 mg by mouth every morning.   gabapentin 300 MG capsule Commonly known as: NEURONTIN Take 300 mg by mouth in the morning and at bedtime.   HumaLOG 100 UNIT/ML injection Generic drug: insulin lispro Before each meal 3 times a day, 140-199 - 2 units, 200-250 - 4 units, 251-299 - 6 units,  300-349 - 8 units,  350 or above 10 units. What changed:  how much to take how to take this when to take this additional instructions   insulin glargine 100 UNIT/ML injection Commonly known as: LANTUS Inject 0.15 mLs (15 Units total) into the skin 2 (two) times daily. What changed: how much to take   nitroGLYCERIN 0.4 MG SL tablet Commonly known as: NITROSTAT Place 1 tablet (0.4 mg total) under the tongue every 5 (five) minutes as needed for chest pain.   rOPINIRole 1 MG tablet Commonly known as: REQUIP Take 1 mg by mouth at bedtime.   solifenacin 10 MG tablet Commonly known as: VESICARE Take 10 mg by mouth daily.   torsemide 100 MG tablet Commonly known as: DEMADEX Take 50 mg by mouth 2 (two) times daily.         Follow-up Information     Paulina Fusi, MD. Schedule an appointment as soon as possible for a visit in 1 week(s).   Specialty: Internal Medicine Contact information: [address] [place] [address] [phone]         Baldo Daub, MD. Schedule an appointment as soon as possible for a visit in 1 week(s).   Specialties: Cardiology, Radiology Contact information: [address] [place] [address] [phone]                 Major procedures and Radiology Reports - PLEASE review detailed and final reports thoroughly  -        ECHOCARDIOGRAM COMPLETE  Result Date: 11/08/2022    ECHOCARDIOGRAM REPORT   Patient Name:   SHIRLEYMAE CHACKO Date of Exam: 11/08/2022 Medical Rec #:  [id number]    Height:       63.0 in Accession #:    [id number]   Weight:       162.0 lb Date of Birth:  1946/05/01    BSA:          1.768 m Patient Age:    77 years     BP:           111/59 mmHg Patient Gender: F            HR:           75 bpm. Exam Location:  Inpatient Procedure: 2D Echo, Cardiac Doppler and Color Doppler Indications:    syncope  History:        Patient has prior history of Echocardiogram examinations, most                 recent 11/16/2018. COPD; Risk Factors:Hypertension, Dyslipidemia                 and Diabetes.  Sonographer:  Delcie Roch RDCS Referring Phys: [id number] CHRISTOPHER P DANFORD IMPRESSIONS  1. Left ventricular ejection fraction, by estimation, is 65 to 70%. The left ventricle has normal function. The left ventricle has no regional wall motion abnormalities. There is mild left ventricular hypertrophy. Left ventricular diastolic parameters are consistent with Grade II diastolic dysfunction (pseudonormalization). Elevated left atrial pressure.  2. Right ventricular systolic function is normal. The right ventricular size is normal. There is normal pulmonary artery systolic pressure. The estimated right ventricular systolic pressure is 26.6 mmHg.  3. The mitral valve is degenerative. Trivial  mitral valve regurgitation. Mild mitral stenosis. The mean mitral valve gradient is 4.0 mmHg with average heart rate of 73 bpm. Severe mitral annular calcification.  4. The aortic valve is tricuspid. Aortic valve regurgitation is not visualized. Aortic valve sclerosis/calcification is present, without any evidence of aortic stenosis.  5. The inferior vena cava is normal in size with greater than 50% respiratory variability, suggesting right atrial pressure of 3 mmHg. FINDINGS  Left Ventricle: Left ventricular ejection fraction, by estimation, is 65 to 70%. The left ventricle has normal function. The left ventricle has no regional wall motion abnormalities. The left ventricular internal cavity size was normal in size. There is  mild left ventricular hypertrophy. Left ventricular diastolic parameters are consistent with Grade II diastolic dysfunction (pseudonormalization). Elevated left atrial pressure. Right Ventricle: The right ventricular size is normal. No increase in right ventricular wall thickness. Right ventricular systolic function is normal. There is normal pulmonary artery systolic pressure. The tricuspid regurgitant velocity is 2.43 m/s, and  with an assumed right atrial pressure of 3 mmHg, the estimated right ventricular systolic pressure is 26.6 mmHg. Left Atrium: Left atrial size was normal in size. Right Atrium: Right atrial size was normal in size. Pericardium: There is no evidence of pericardial effusion. Mitral Valve: The mitral valve is degenerative in appearance. Severe mitral annular calcification. Trivial mitral valve regurgitation. Mild mitral valve stenosis. MV peak gradient, 9.5 mmHg. The mean mitral valve gradient is 4.0 mmHg with average heart rate of 73 bpm. Tricuspid Valve: The tricuspid valve is normal in structure. Tricuspid valve regurgitation is trivial. Aortic Valve: The aortic valve is tricuspid. Aortic valve regurgitation is not visualized. Aortic valve sclerosis/calcification is  present, without any evidence of aortic stenosis. Pulmonic Valve: The pulmonic valve was grossly normal. Pulmonic valve regurgitation is not visualized. Aorta: The aortic root and ascending aorta are structurally normal, with no evidence of dilitation. Venous: The inferior vena cava is normal in size with greater than 50% respiratory variability, suggesting right atrial pressure of 3 mmHg. IAS/Shunts: The interatrial septum was not well visualized.  LEFT VENTRICLE PLAX 2D LVIDd:         4.70 cm   Diastology LVIDs:         3.00 cm   LV e' medial:    6.09 cm/s LV PW:         1.10 cm   LV E/e' medial:  20.4 LV IVS:        1.10 cm   LV e' lateral:   6.53 cm/s LVOT diam:     1.60 cm   LV E/e' lateral: 19.0 LV SV:         43 LV SV Index:   24 LVOT Area:     2.01 cm  RIGHT VENTRICLE             IVC RV Basal diam:  2.50 cm     IVC diam: 1.70 cm  RV S prime:     13.60 cm/s TAPSE (M-mode): 2.0 cm LEFT ATRIUM             Index        RIGHT ATRIUM           Index LA diam:        3.70 cm 2.09 cm/m   RA Area:     12.10 cm LA Vol (A2C):   63.0 ml 35.63 ml/m  RA Volume:   24.20 ml  13.69 ml/m LA Vol (A4C):   44.3 ml 25.06 ml/m LA Biplane Vol: 52.0 ml 29.41 ml/m  AORTIC VALVE LVOT Vmax:   104.00 cm/s LVOT Vmean:  67.700 cm/s LVOT VTI:    0.214 m  AORTA Ao Root diam: 2.40 cm Ao Asc diam:  3.30 cm MITRAL VALVE                TRICUSPID VALVE MV Area (PHT): 2.95 cm     TR Peak grad:   23.6 mmHg MV Area VTI:   1.07 cm     TR Vmax:        243.00 cm/s MV Peak grad:  9.5 mmHg MV Mean grad:  4.0 mmHg     SHUNTS MV Vmax:       1.54 m/s     Systemic VTI:  0.21 m MV Vmean:      81.3 cm/s    Systemic Diam: 1.60 cm MV Decel Time: 257 msec MV E velocity: 124.00 cm/s MV A velocity: 106.00 cm/s MV E/A ratio:  1.17 Epifanio Lesches MD Electronically signed by Epifanio Lesches MD Signature Date/Time: 11/08/2022/4:03:43 PM    Final    EP PPM/ICD IMPLANT  Result Date: 11/08/2022 SURGEON:  Francis Dowse, PA Assistant: Will Jorja Loa,  MD   PREPROCEDURE DIAGNOSIS:  Syncope   POSTPROCEDURE DIAGNOSIS: Syncope    PROCEDURES:  1. Implantable loop recorder implantation   INTRODUCTION:  DENIS SKALKA presents with a history of syncope The costs of loop recorder monitoring have been discussed with the patient. Appropriate time out was performed prior to the procedure.   DESCRIPTION OF PROCEDURE:  Informed written consent was obtained.  The patient required no sedation for the procedure today.  Mapping over the patient's chest was performed to identify the area where electrograms were most prominent for ILR recording.  This area was found to be the left parasternal region over the 4th intercostal space. The patients left chest was therefore prepped and draped in the usual sterile fashion. The skin overlying the left parasternal region was infiltrated with lidocaine for local analgesia.  A 0.5-cm incision was made over the left parasternal region over the 3rd intercostal space.  A subcutaneous ILR pocket was fashioned using a combination of sharp and blunt dissection.  A [employer] Reveal LINQ 2 (serial # [id number]) implantable loop recorder was then placed into the pocket  R waves were very prominent and measured 0.56mV.  Steri- Strips and a sterile dressing were then applied.  There were no early apparent complications.   Will Camnitz was present during the entirety of the case for proctoring and supervision. CONCLUSIONS:  1. Successful implantation of a implantable loop recorder for a history of syncope  2. No early apparent complications. Will Jorja Loa, MD 11/08/2022 2:32 PM   EEG adult  Result Date: 11/08/2022 Charlsie Quest, MD     11/08/2022  1:30 PM Patient Name: NEZZIE MAYER MRN: [id number] Epilepsy Attending: Charlsie Quest Referring Physician/Provider:  Leroy Sea, MD Date: 11/08/2022 Duration: 22.46 mins Patient history: 77yo F with syncope. EEG to evaluate for seizure. Level of alertness: Awake AEDs during EEG study: GBP Technical  aspects: This EEG study was done with scalp electrodes positioned according to the 10-20 International system of electrode placement. Electrical activity was reviewed with band pass filter of 1-70Hz , sensitivity of 7 uV/mm, display speed of 10mm/sec with a 60Hz  notched filter applied as appropriate. EEG data were recorded continuously and digitally stored.  Video monitoring was available and reviewed as appropriate. Description: The posterior dominant rhythm consists of 6 Hz activity of moderate voltage (25-35 uV) seen predominantly in posterior head regions, symmetric and reactive to eye opening and eye closing. EEG showed continuous generalized 5 to 7 Hz theta slowing. Hyperventilation and photic stimulation were not performed.   ABNORMALITY - Continuous slow, generalized - Background slow IMPRESSION: This study is suggestive of mild to moderate diffuse encephalopathy, nonspecific etiology. No seizures or epileptiform discharges were seen throughout the recording. Charlsie Quest   DG Ribs Unilateral W/Chest Right  Result Date: 11/07/2022 CLINICAL DATA:  Status post fall with right lateral/anterior rib pain EXAM: RIGHT RIBS AND CHEST - 3 VIEW COMPARISON:  Chest radiograph dated 03/26/2022 FINDINGS: Normal lung volumes. No focal consolidations. No pneumothorax. Focal pleural thickening along the lower right lateral lung adjacent to the rib fractures. The heart size and mediastinal contours are within normal limits. Displaced fractures of the right lateral seventh through ninth ribs. IMPRESSION: 1. Displaced fractures of the right lateral seventh through ninth ribs. 2. Focal pleural thickening along the lower right lateral lung adjacent to the rib fractures, likely a small pleural effusion. No pneumothorax. Electronically Signed   By: Agustin Cree M.D.   On: 11/07/2022 14:02   MR BRAIN WO CONTRAST  Result Date: 11/07/2022 CLINICAL DATA:  Provided history: Syncope/presyncope, cerebrovascular cause suspected.  Right-sided weakness/pain. EXAM: MRI HEAD WITHOUT CONTRAST TECHNIQUE: Multiplanar, multiecho pulse sequences of the brain and surrounding structures were obtained without intravenous contrast. COMPARISON:  Non-contrast head CT and CT angiogram head/neck performed earlier today 11/07/2022. Brain MRI 05/18/2020. FINDINGS: Intermittently motion degraded examination, limiting evaluation. Most notably, there is moderate motion degradation of the axial T2 FLAIR sequence and moderate motion degradation of the coronal T2 sequence. Brain: Mild-to-moderate generalized cerebral atrophy. Small chronic cortical/subcortical infarcts within the bilateral parietal and left occipital lobes, new from the prior brain MRI of 05/18/2020. Chronic hemosiderin deposition and/or cortical laminar necrosis associated with the chronic infarct in the right parietal lobe. Background multifocal T2 FLAIR hyperintense signal abnormality within the cerebral white matter and pons, nonspecific but compatible with mild chronic small vessel ischemic disease. Chronic lacunar infarct within the left thalamus, new from the prior MRI. Punctate chronic microhemorrhage within the left parietal lobe (series 14, image 29). Redemonstrated tiny chronic infarct within the left cerebellar hemisphere. Partially empty sella turcica. There is no acute infarct. No evidence of an intracranial mass. No extra-axial fluid collection. No midline shift. Vascular: Maintained flow voids within the proximal large arterial vessels. Skull and upper cervical spine: No focal suspicious marrow lesion. Incompletely assessed cervical spondylosis. Sinuses/Orbits: No mass or acute finding within the imaged orbits. No significant paranasal sinus disease. Other: Nonspecific 6 mm soft tissue lesion arising from the right pterional scalp. IMPRESSION: 1. Intermittently motion degraded examination. 2. No evidence of an acute intracranial abnormality. Specifically, the diffusion-weighted  imaging is of good quality and there is no evidence of an acute infarct. 3. Small chronic cortical/subcortical  infarcts within the bilateral parietal and left occipital lobes, new from the prior brain MRI of 05/18/2020. 4. Chronic lacunar infarct within the left thalamus, new from the prior MRI. 5. Background mild chronic small vessel ischemic changes within the cerebral white matter and pons. 6. Redemonstrated tiny chronic infarct within the left cerebellar hemisphere. 7. Mild-to-moderate generalized cerebral atrophy. 8. 6 mm soft tissue lesion arising from the right pterional scalp. Direct examination recommended. Electronically Signed   By: Jackey Loge D.O.   On: 11/07/2022 12:34   CT ANGIO HEAD NECK W WO CM  Result Date: 11/07/2022 CLINICAL DATA:  Right-sided gaze, slumped over EXAM: CT ANGIOGRAPHY HEAD AND NECK WITH AND WITHOUT CONTRAST TECHNIQUE: Multidetector CT imaging of the head and neck was performed using the standard protocol during bolus administration of intravenous contrast. Multiplanar CT image reconstructions and MIPs were obtained to evaluate the vascular anatomy. Carotid stenosis measurements (when applicable) are obtained utilizing NASCET criteria, using the distal internal carotid diameter as the denominator. RADIATION DOSE REDUCTION: This exam was performed according to the departmental dose-optimization program which includes automated exposure control, adjustment of the mA and/or kV according to patient size and/or use of iterative reconstruction technique. CONTRAST:  75mL OMNIPAQUE IOHEXOL 350 MG/ML SOLN COMPARISON:  Same-day noncontrast CT head, carotid Doppler 01/26/2019, CTA head/neck 11/15/2018 FINDINGS: Image quality is degraded by motion artifact particularly at the level of the carotid bifurcations. CTA NECK FINDINGS Aortic arch: There is calcified plaque in the imaged aortic arch. The origins of the major branch vessels are patent. The subclavian arteries are patent to the level  imaged. Right carotid system: The right common, internal, and external carotid arteries appear patent, without evidence of hemodynamically significant stenosis or occlusion there is no evidence of dissection or aneurysm, within the confines of above-described motion artifact. Left carotid system: The left common carotid artery is suboptimally evaluated due to motion and streak artifact from adjacent venous contrast. The bifurcation is also not well evaluated. The distal internal carotid artery in the neck is widely patent. There is no definite dissection or aneurysm, within the above-described confines. Vertebral arteries: There is mild stenosis of the origin of the right vertebral artery. The vertebral arteries are otherwise patent, without hemodynamically significant stenosis or occlusion. There is no evidence of dissection or aneurysm. Skeleton: There is multilevel disc space narrowing and degenerative endplate change of the cervical spine. There is no visible canal hematoma. Other neck: The soft tissues of the neck are unremarkable. Upper chest: The imaged lung apices are clear. Review of the MIP images confirms the above findings CTA HEAD FINDINGS Anterior circulation: There is calcified plaque in the intracranial ICAs resulting in up to moderate to severe stenosis bilaterally. The bilateral MCAs and ACAs are patent, without proximal stenosis or occlusion. The anterior communicating artery is normal. There is no aneurysm or AVM. Posterior circulation: The bilateral V4 segments are patent with mild calcified plaque on the left. The basilar artery is patent. The major cerebellar arteries appear patent. The PCAs are patent, with short-segment moderate stenosis of the left P1/P2 segment (11-25). Posterior communicating arteries are not identified. There is no aneurysm or AVM. Venous sinuses: As permitted by contrast timing, patent. Anatomic variants: None. Review of the MIP images confirms the above findings  IMPRESSION: 1. Image quality in the neck is degraded by motion artifact as well as streak artifact from venous contamination. Specifically, the left common carotid artery and carotid bifurcation are not well evaluated. The imaged left internal carotid  artery in the neck is widely patent. 2. Patent right carotid system without hemodynamically significant stenosis or occlusion. Patent vertebral arteries with mild stenosis at the origin on the right. 3. Calcified plaque in the intracranial ICAs resulting in moderate to severe stenosis bilaterally, and short-segment moderate stenosis of the left P1/P2 segment. Electronically Signed   By: Lesia Hausen M.D.   On: 11/07/2022 11:43   CT HEAD WO CONTRAST ( )  Result Date: 11/07/2022 CLINICAL DATA:  Fall on blood thinners EXAM: CT HEAD WITHOUT CONTRAST TECHNIQUE: Contiguous axial images were obtained from the base of the skull through the vertex without intravenous contrast. RADIATION DOSE REDUCTION: This exam was performed according to the departmental dose-optimization program which includes automated exposure control, adjustment of the mA and/or kV according to patient size and/or use of iterative reconstruction technique. COMPARISON:  05/17/2020 FINDINGS: Brain: No evidence of acute infarction, hemorrhage, hydrocephalus, extra-axial collection or mass lesion/mass effect. Periventricular and deep white matter hypodensity. New encephalomalacia of the right parietal vertex (4, image 20. Vascular: No hyperdense vessel or unexpected calcification. Skull: Normal. Negative for fracture or focal lesion. Sinuses/Orbits: No acute finding. Other: None. IMPRESSION: 1. No acute intracranial pathology. Small-vessel white matter disease. 2. New encephalomalacia of the right parietal vertex, consistent with interval, although nonacute infarction. Electronically Signed   By: Jearld Lesch M.D.   On: 11/07/2022 10:55    Micro Results     No results found for this or any previous  visit (from the past 240 hour(s)).  Today   Subjective    Andrian Bumgarner today has no headache,no chest abdominal pain,no new weakness tingling or numbness, feels much better   Objective   Blood pressure (!) 117/58, pulse 72, temperature (!) 97.5 F (36.4 C), temperature source Oral, resp. rate 18, SpO2 97 %.   Intake/Output Summary (Last 24 hours) at 11/10/2022 0748 Last data filed at 11/10/2022 0438 Gross per 24 hour  Intake --  Output 1150 ml  Net -1150 ml    Exam  Awake Alert, No new F.N deficits,    [place].AT,PERRAL Supple Neck,   Symmetrical Chest wall movement, Good air movement bilaterally, CTAB RRR,No Gallops,   +ve B.Sounds, Abd Soft, Non tender,  No Cyanosis, Clubbing or edema    Data Review   Recent Labs  Lab 11/07/22 1050 11/08/22 0314 11/09/22 0141  WBC 12.8* 11.2* 9.7  HGB 12.6 13.2 14.6  HCT 41.2 41.5 46.2*  PLT 293 288 275  MCV 87.3 84.5 85.7  MCH 26.7 26.9 27.1  MCHC 30.6 31.8 31.6  RDW 15.2 15.6* 15.9*  LYMPHSABS 2.3  --   --   MONOABS 1.1*  --   --   EOSABS 0.1  --   --   BASOSABS 0.1  --   --     Recent Labs  Lab 11/07/22 1050 11/08/22 0313 11/08/22 0314 11/09/22 0141  NA 140  --  137 137  K 3.4*  --  3.6 4.0  CL 105  --  99 101  CO2 26  --  31 27  ANIONGAP 9  --  7 9  GLUCOSE 66*  --  55* 100*  BUN 10  --  11 17  CREATININE 1.02*  --  0.98 1.07*  AST 23  --   --   --   ALT 15  --   --   --   ALKPHOS 71  --   --   --   BILITOT 0.3  --   --   --  ALBUMIN 3.0*  --   --   --   INR 1.1  --   --   --   TSH  --  1.658  --   --   HGBA1C  --   --  8.2*  --   MG  --  2.2  --  2.2  CALCIUM 8.5*  --  8.7* 8.7*    Total Time in preparing paper work, data evaluation and todays exam - 35 minutes  Signature  -    Susa Raring M.D on 11/10/2022 at 7:48 AM   -  To page go to [email or web address]

## 2022-11-10 NOTE — Telephone Encounter (Signed)
-----   Message from Sheilah Pigeon, [place] sent at 11/08/2022  2:47 PM EDT ----- MDT loop implanted today WC  Recurrent syncope  Francis Dowse, PA-C

## 2022-11-10 NOTE — Plan of Care (Signed)
  Problem: Education: Goal: Knowledge of General Education information will improve Description: Including pain rating scale, medication(s)/side effects and non-pharmacologic comfort measures Outcome: Completed/Met   Problem: Health Behavior/Discharge Planning: Goal: Ability to manage health-related needs will improve Outcome: Completed/Met   Problem: Clinical Measurements: Goal: Ability to maintain clinical measurements within normal limits will improve Outcome: Completed/Met Goal: Will remain free from infection Outcome: Completed/Met Goal: Diagnostic test results will improve Outcome: Completed/Met Goal: Respiratory complications will improve Outcome: Completed/Met Goal: Cardiovascular complication will be avoided Outcome: Completed/Met   Problem: Activity: Goal: Risk for activity intolerance will decrease Outcome: Completed/Met   Problem: Nutrition: Goal: Adequate nutrition will be maintained Outcome: Completed/Met   Problem: Coping: Goal: Level of anxiety will decrease Outcome: Completed/Met   Problem: Elimination: Goal: Will not experience complications related to bowel motility Outcome: Completed/Met Goal: Will not experience complications related to urinary retention Outcome: Completed/Met   Problem: Pain Managment: Goal: General experience of comfort will improve Outcome: Completed/Met   Problem: Safety: Goal: Ability to remain free from injury will improve Outcome: Completed/Met   Problem: Skin Integrity: Goal: Risk for impaired skin integrity will decrease Outcome: Completed/Met   Problem: Education: Goal: Understanding of CV disease, CV risk reduction, and recovery process will improve Outcome: Completed/Met Goal: Individualized Educational Video(s) Outcome: Completed/Met   Problem: Activity: Goal: Ability to return to baseline activity level will improve Outcome: Completed/Met   Problem: Cardiovascular: Goal: Ability to achieve and maintain  adequate cardiovascular perfusion will improve Outcome: Completed/Met Goal: Vascular access site(s) Level 0-1 will be maintained Outcome: Completed/Met   Problem: Health Behavior/Discharge Planning: Goal: Ability to safely manage health-related needs after discharge will improve Outcome: Completed/Met   Problem: Education: Goal: Ability to describe self-care measures that may prevent or decrease complications (Diabetes Survival Skills Education) will improve Outcome: Completed/Met Goal: Individualized Educational Video(s) Outcome: Completed/Met   Problem: Coping: Goal: Ability to adjust to condition or change in health will improve Outcome: Completed/Met   Problem: Fluid Volume: Goal: Ability to maintain a balanced intake and output will improve Outcome: Completed/Met   Problem: Health Behavior/Discharge Planning: Goal: Ability to identify and utilize available resources and services will improve Outcome: Completed/Met Goal: Ability to manage health-related needs will improve Outcome: Completed/Met   Problem: Metabolic: Goal: Ability to maintain appropriate glucose levels will improve Outcome: Completed/Met   Problem: Nutritional: Goal: Maintenance of adequate nutrition will improve Outcome: Completed/Met Goal: Progress toward achieving an optimal weight will improve Outcome: Completed/Met   Problem: Skin Integrity: Goal: Risk for impaired skin integrity will decrease Outcome: Completed/Met   Problem: Tissue Perfusion: Goal: Adequacy of tissue perfusion will improve Outcome: Completed/Met

## 2022-11-10 NOTE — Discharge Summary (Signed)
[name] ZOX:[id number] DOB: [birth year] DOA: 11/07/2022   PCP: Paulina Fusi, MD   Admit date: 11/07/2022  Discharge date: 11/10/2022   Admitted From: Home   Disposition:  SNF     Recommendations for Outpatient Follow-up:    Follow up with PCP in 1-2 weeks   PCP Please obtain BMP/CBC, 2 view CXR in 1week,  (see Discharge instructions)    PCP Please follow up on the following pending results:      Home Health: None   Equipment/Devices: None  Consultations: Cards Discharge Condition: Stable    CODE STATUS: Full    Diet Recommendation: Heart Healthy Low Carb        Chief Complaint  Patient presents with   Fall      Brief history of present illness from the day of admission and additional interim summary     77 y.o. F with HTN, DM, hx CVA without residual deficits, dCHF, carotid PVD, and COPD who presented with syncope, she is a poor historian but says that she has been having episodes where she passes out for several minutes, these episodes have been happening once or twice a month for the last several months. According to the husband she went to a funeral where she had passed out for at least 15 minutes, no bowel or bladder incontinence, no chest pain, came to the ER where she underwent CT head which was nonacute she was admitted for further care. Of note patient was recently seen by cardiology and given a Zio patch which per prelim evaluation appears stable.    Workup here was consistent with episodes of hypoglycemia and hypotension, which could be the reason for her syncope at home.                                                                  Hospital Course    Recurrent episodes of Syncope - Patient's ability to articulate her symptoms is  limited, husband pretty forgetful as well.  No clear reason for recurrent syncope, she had borderline low blood pressures and low CBGs upon arrival, I think these 2 are the main culprits for her blackout episodes, she also was found to have  bilateral intracranial carotid artery disease for  which medical treatment is recommended by vascular surgery, her Zio patch evaluation was stable, she has received a loop recorder here.  Echocardiogram stable, seen by cardiology as well.   With adjustment of her blood pressure medications and diabetic medications adjustment her CBGs and blood pressure are running great and she is completely symptom-free without any further blackout spells, underwent PT OT evaluation now being discharged to SNF for further care.     For her bilateral carotid artery disease.  Vascular surgery input, continue Plavix and statin for now.   Right 7-9th rib fractures due to fall at home.  Supportive care.   Hypertension  - Continue ARB, hydralazine, adjust diuretics as needed.   Cerebrovascular disease - MRI brain.  Nonacute.  Continue Plavix, Zetia, Lipitor   Carotid stenosis, bilateral  On Plavix and statin, case discussed with vascular surgeon Dr. Myra Gianotti, no need for intervention as most of the lesions are intracranial.   COPD (chronic obstructive pulmonary disease) (HCC)  No flare   Hyperlipidemia - Continue Lipitor and Zetia   Chronic diastolic congestive heart failure (HCC)  Appears euvolemic.  Cardiology following.  Diuretics on hold.  Marcelline Deist to be discontinued upon discharge per cardiology.  She has received loop recorder here post discharge will follow-up with her cardiologist Dr. Dulce Sellar in 1 to 2 weeks.   Uncontrolled type 2 diabetes mellitus with hypoglycemia, with long-term current use of insulin (HCC) Discontinue glimepiride and Farxiga, insulin dose significantly reduced.  Continue to monitor CBGs at SNF and adjust insulin further as needed, keep her regimen  simplified.     Discharge diagnosis       Principal Problem:   Syncope and collapse Active Problems:   Right 7-9th rib fractures   Chronic diastolic congestive heart failure (HCC)   Hyperlipidemia   COPD (chronic obstructive pulmonary disease) (HCC)   Carotid stenosis, right   Uncontrolled type 2 diabetes mellitus with hypoglycemia, with long-term current use of insulin (HCC)   Cerebrovascular disease   Hypertension       Discharge instructions     Discharge Instructions       Discharge instructions   Complete by: As directed      Follow with Primary MD Paulina Fusi, MD in 7 days    Get CBC, CMP, 2 view Chest X ray, Magnesium -  checked next visit with your primary MD or SNF MD     Activity: As tolerated with Full fall precautions use walker/cane & assistance as needed   Disposition SNF   Diet: Heart Healthy low carbohydrate diet, check CBGs q. ACH S and adjust insulin further at SNF as needed.   Special Instructions: If you have smoked or chewed Tobacco  in the last 2 yrs please stop smoking, stop any regular Alcohol  and or any Recreational drug use.   On your next visit with your primary care physician please Get Medicines reviewed and adjusted.   Please request your Prim.MD to go over all Hospital Tests and Procedure/Radiological results at the follow up, please get all Hospital records sent to your Prim MD by signing hospital release before you go home.   If you experience worsening of your admission symptoms, develop shortness of breath, life threatening emergency, suicidal or homicidal thoughts you must seek medical attention immediately by calling 911 or calling your MD immediately  if symptoms less severe.   You Must read complete instructions/literature along with all the possible adverse reactions/side effects for all the Medicines you take and  that have been prescribed to you. Take any new Medicines after you have completely understood and accpet all the  possible adverse reactions/side effects.    Increase activity slowly   Complete by: As directed             Discharge Medications    Allergies as of 11/10/2022         Reactions    Alendronate      Other reaction(s): Other (See Comments) Makes me feel very weak    Valsartan Nausea And Vomiting            Medication List       STOP taking these medications     Farxiga 10 MG Tabs tablet Generic drug: dapagliflozin propanediol    glimepiride 4 MG tablet Commonly known as: AMARYL    hydrALAZINE 25 MG tablet Commonly known as: APRESOLINE    potassium chloride SA 20 MEQ tablet Commonly known as: KLOR-CON M    valsartan 320 MG tablet Commonly known as: DIOVAN           TAKE these medications     atorvastatin 80 MG tablet Commonly known as: LIPITOR Take 80 mg by mouth at bedtime.    clopidogrel 75 MG tablet Commonly known as: PLAVIX Take 75 mg by mouth every morning.    ezetimibe 10 MG tablet Commonly known as: ZETIA Take 10 mg by mouth every morning.    gabapentin 300 MG capsule Commonly known as: NEURONTIN Take 300 mg by mouth in the morning and at bedtime.    HumaLOG 100 UNIT/ML injection Generic drug: insulin lispro Before each meal 3 times a day, 140-199 - 2 units, 200-250 - 4 units, 251-299 - 6 units,  300-349 - 8 units,  350 or above 10 units. What changed:  how much to take how to take this when to take this additional instructions    insulin glargine 100 UNIT/ML injection Commonly known as: LANTUS Inject 0.15 mLs (15 Units total) into the skin 2 (two) times daily. What changed: how much to take    nitroGLYCERIN 0.4 MG SL tablet Commonly known as: NITROSTAT Place 1 tablet (0.4 mg total) under the tongue every 5 (five) minutes as needed for chest pain.    rOPINIRole 1 MG tablet Commonly known as: REQUIP Take 1 mg by mouth at bedtime.    solifenacin 10 MG tablet Commonly known as: VESICARE Take 10 mg by mouth daily.    torsemide 100  MG tablet Commonly known as: DEMADEX Take 50 mg by mouth 2 (two) times daily.               Follow-up Information       Paulina Fusi, MD. Schedule an appointment as soon as possible for a visit in 1 week(s).   Specialty: Internal Medicine Contact information: [address] [place] [address] [phone]              Baldo Daub, MD. Schedule an appointment as soon as possible for a visit in 1 week(s).   Specialties: Cardiology, Radiology Contact information: [address] [place] [address] [phone]                          Major procedures and Radiology Reports - PLEASE review detailed and final reports thoroughly  -           Imaging Results  ECHOCARDIOGRAM COMPLETE   Result Date: 11/08/2022  ECHOCARDIOGRAM REPORT   Patient Name:   [name] Date of Exam: 11/08/2022 Medical Rec #:  [id number]    Height:       63.0 in Accession #:    [id number]   Weight:       162.0 lb Date of Birth:  [birth year]    BSA:          1.768 m Patient Age:    77 years     BP:           111/59 mmHg Patient Gender: F            HR:           75 bpm. Exam Location:  Inpatient Procedure: 2D Echo, Cardiac Doppler and Color Doppler Indications:    syncope  History:        Patient has prior history of Echocardiogram examinations, most                 recent 11/16/2018. COPD; Risk Factors:Hypertension, Dyslipidemia                 and Diabetes.  Sonographer:    Delcie Roch RDCS Referring Phys: [id number] CHRISTOPHER P DANFORD IMPRESSIONS  1. Left ventricular ejection fraction, by estimation, is 65 to 70%. The left ventricle has normal function. The left ventricle has no regional wall motion abnormalities. There is mild left ventricular hypertrophy. Left ventricular diastolic parameters are consistent with Grade II diastolic dysfunction (pseudonormalization). Elevated left atrial pressure.  2. Right ventricular systolic function is normal. The right ventricular  size is normal. There is normal pulmonary artery systolic pressure. The estimated right ventricular systolic pressure is 26.6 mmHg.  3. The mitral valve is degenerative. Trivial mitral valve regurgitation. Mild mitral stenosis. The mean mitral valve gradient is 4.0 mmHg with average heart rate of 73 bpm. Severe mitral annular calcification.  4. The aortic valve is tricuspid. Aortic valve regurgitation is not visualized. Aortic valve sclerosis/calcification is present, without any evidence of aortic stenosis.  5. The inferior vena cava is normal in size with greater than 50% respiratory variability, suggesting right atrial pressure of 3 mmHg. FINDINGS  Left Ventricle: Left ventricular ejection fraction, by estimation, is 65 to 70%. The left ventricle has normal function. The left ventricle has no regional wall motion abnormalities. The left ventricular internal cavity size was normal in size. There is  mild left ventricular hypertrophy. Left ventricular diastolic parameters are consistent with Grade II diastolic dysfunction (pseudonormalization). Elevated left atrial pressure. Right Ventricle: The right ventricular size is normal. No increase in right ventricular wall thickness. Right ventricular systolic function is normal. There is normal pulmonary artery systolic pressure. The tricuspid regurgitant velocity is 2.43 m/s, and  with an assumed right atrial pressure of 3 mmHg, the estimated right ventricular systolic pressure is 26.6 mmHg. Left Atrium: Left atrial size was normal in size. Right Atrium: Right atrial size was normal in size. Pericardium: There is no evidence of pericardial effusion. Mitral Valve: The mitral valve is degenerative in appearance. Severe mitral annular calcification. Trivial mitral valve regurgitation. Mild mitral valve stenosis. MV peak gradient, 9.5 mmHg. The mean mitral valve gradient is 4.0 mmHg with average heart rate of 73 bpm. Tricuspid Valve: The tricuspid valve is normal in  structure. Tricuspid valve regurgitation is trivial. Aortic Valve: The aortic valve is tricuspid. Aortic valve regurgitation is not visualized. Aortic valve sclerosis/calcification is present, without any evidence of aortic stenosis. Pulmonic Valve: The pulmonic valve was grossly normal.  Pulmonic valve regurgitation is not visualized. Aorta: The aortic root and ascending aorta are structurally normal, with no evidence of dilitation. Venous: The inferior vena cava is normal in size with greater than 50% respiratory variability, suggesting right atrial pressure of 3 mmHg. IAS/Shunts: The interatrial septum was not well visualized.  LEFT VENTRICLE PLAX 2D LVIDd:         4.70 cm   Diastology LVIDs:         3.00 cm   LV e' medial:    6.09 cm/s LV PW:         1.10 cm   LV E/e' medial:  20.4 LV IVS:        1.10 cm   LV e' lateral:   6.53 cm/s LVOT diam:     1.60 cm   LV E/e' lateral: 19.0 LV SV:         43 LV SV Index:   24 LVOT Area:     2.01 cm  RIGHT VENTRICLE             IVC RV Basal diam:  2.50 cm     IVC diam: 1.70 cm RV S prime:     13.60 cm/s TAPSE (M-mode): 2.0 cm LEFT ATRIUM             Index        RIGHT ATRIUM           Index LA diam:        3.70 cm 2.09 cm/m   RA Area:     12.10 cm LA Vol (A2C):   63.0 ml 35.63 ml/m  RA Volume:   24.20 ml  13.69 ml/m LA Vol (A4C):   44.3 ml 25.06 ml/m LA Biplane Vol: 52.0 ml 29.41 ml/m  AORTIC VALVE LVOT Vmax:   104.00 cm/s LVOT Vmean:  67.700 cm/s LVOT VTI:    0.214 m  AORTA Ao Root diam: 2.40 cm Ao Asc diam:  3.30 cm MITRAL VALVE                TRICUSPID VALVE MV Area (PHT): 2.95 cm     TR Peak grad:   23.6 mmHg MV Area VTI:   1.07 cm     TR Vmax:        243.00 cm/s MV Peak grad:  9.5 mmHg MV Mean grad:  4.0 mmHg     SHUNTS MV Vmax:       1.54 m/s     Systemic VTI:  0.21 m MV Vmean:      81.3 cm/s    Systemic Diam: 1.60 cm MV Decel Time: 257 msec MV E velocity: 124.00 cm/s MV A velocity: 106.00 cm/s MV E/A ratio:  1.17 Epifanio Lesches MD Electronically signed  by Epifanio Lesches MD Signature Date/Time: 11/08/2022/4:03:43 PM    Final     EP PPM/ICD IMPLANT   Result Date: 11/08/2022 SURGEON:  Francis Dowse, PA Assistant: Will Jorja Loa, MD   PREPROCEDURE DIAGNOSIS:  Syncope   POSTPROCEDURE DIAGNOSIS: Syncope    PROCEDURES:  1. Implantable loop recorder implantation   INTRODUCTION:  BRICEIDA ELFRINK presents with a history of syncope The costs of loop recorder monitoring have been discussed with the patient. Appropriate time out was performed prior to the procedure.   DESCRIPTION OF PROCEDURE:  Informed written consent was obtained.  The patient required no sedation for the procedure today.  Mapping over the patient's chest was performed to identify the area where electrograms were most  prominent for ILR recording.  This area was found to be the left parasternal region over the 4th intercostal space. The patients left chest was therefore prepped and draped in the usual sterile fashion. The skin overlying the left parasternal region was infiltrated with lidocaine for local analgesia.  A 0.5-cm incision was made over the left parasternal region over the 3rd intercostal space.  A subcutaneous ILR pocket was fashioned using a combination of sharp and blunt dissection.  A [employer] Reveal LINQ 2 (serial # [id number]) implantable loop recorder was then placed into the pocket  R waves were very prominent and measured 0.6mV.  Steri- Strips and a sterile dressing were then applied.  There were no early apparent complications.   Will Camnitz was present during the entirety of the case for proctoring and supervision. CONCLUSIONS:  1. Successful implantation of a implantable loop recorder for a history of syncope  2. No early apparent complications. Will Jorja Loa, MD 11/08/2022 2:32 PM    EEG adult   Result Date: 11/08/2022 Charlsie Quest, MD     11/08/2022  1:30 PM Patient Name: [name] MRN: [id number] Epilepsy Attending: Charlsie Quest Referring Physician/Provider:  Leroy Sea, MD Date: 11/08/2022 Duration: 22.46 mins Patient history: 77yo F with syncope. EEG to evaluate for seizure. Level of alertness: Awake AEDs during EEG study: GBP Technical aspects: This EEG study was done with scalp electrodes positioned according to the 10-20 International system of electrode placement. Electrical activity was reviewed with band pass filter of 1-70Hz , sensitivity of 7 uV/mm, display speed of 6mm/sec with a 60Hz  notched filter applied as appropriate. EEG data were recorded continuously and digitally stored.  Video monitoring was available and reviewed as appropriate. Description: The posterior dominant rhythm consists of 6 Hz activity of moderate voltage (25-35 uV) seen predominantly in posterior head regions, symmetric and reactive to eye opening and eye closing. EEG showed continuous generalized 5 to 7 Hz theta slowing. Hyperventilation and photic stimulation were not performed.   ABNORMALITY - Continuous slow, generalized - Background slow IMPRESSION: This study is suggestive of mild to moderate diffuse encephalopathy, nonspecific etiology. No seizures or epileptiform discharges were seen throughout the recording. Charlsie Quest    DG Ribs Unilateral W/Chest Right   Result Date: 11/07/2022 CLINICAL DATA:  Status post fall with right lateral/anterior rib pain EXAM: RIGHT RIBS AND CHEST - 3 VIEW COMPARISON:  Chest radiograph dated 03/26/2022 FINDINGS: Normal lung volumes. No focal consolidations. No pneumothorax. Focal pleural thickening along the lower right lateral lung adjacent to the rib fractures. The heart size and mediastinal contours are within normal limits. Displaced fractures of the right lateral seventh through ninth ribs. IMPRESSION: 1. Displaced fractures of the right lateral seventh through ninth ribs. 2. Focal pleural thickening along the lower right lateral lung adjacent to the rib fractures, likely a small pleural effusion. No pneumothorax. Electronically  Signed   By: Agustin Cree M.D.   On: 11/07/2022 14:02    MR BRAIN WO CONTRAST   Result Date: 11/07/2022 CLINICAL DATA:  Provided history: Syncope/presyncope, cerebrovascular cause suspected. Right-sided weakness/pain. EXAM: MRI HEAD WITHOUT CONTRAST TECHNIQUE: Multiplanar, multiecho pulse sequences of the brain and surrounding structures were obtained without intravenous contrast. COMPARISON:  Non-contrast head CT and CT angiogram head/neck performed earlier today 11/07/2022. Brain MRI 05/18/2020. FINDINGS: Intermittently motion degraded examination, limiting evaluation. Most notably, there is moderate motion degradation of the axial T2 FLAIR sequence and moderate motion degradation of the coronal T2 sequence. Brain: Mild-to-moderate  generalized cerebral atrophy. Small chronic cortical/subcortical infarcts within the bilateral parietal and left occipital lobes, new from the prior brain MRI of 05/18/2020. Chronic hemosiderin deposition and/or cortical laminar necrosis associated with the chronic infarct in the right parietal lobe. Background multifocal T2 FLAIR hyperintense signal abnormality within the cerebral white matter and pons, nonspecific but compatible with mild chronic small vessel ischemic disease. Chronic lacunar infarct within the left thalamus, new from the prior MRI. Punctate chronic microhemorrhage within the left parietal lobe (series 14, image 29). Redemonstrated tiny chronic infarct within the left cerebellar hemisphere. Partially empty sella turcica. There is no acute infarct. No evidence of an intracranial mass. No extra-axial fluid collection. No midline shift. Vascular: Maintained flow voids within the proximal large arterial vessels. Skull and upper cervical spine: No focal suspicious marrow lesion. Incompletely assessed cervical spondylosis. Sinuses/Orbits: No mass or acute finding within the imaged orbits. No significant paranasal sinus disease. Other: Nonspecific 6 mm soft tissue lesion  arising from the right pterional scalp. IMPRESSION: 1. Intermittently motion degraded examination. 2. No evidence of an acute intracranial abnormality. Specifically, the diffusion-weighted imaging is of good quality and there is no evidence of an acute infarct. 3. Small chronic cortical/subcortical infarcts within the bilateral parietal and left occipital lobes, new from the prior brain MRI of 05/18/2020. 4. Chronic lacunar infarct within the left thalamus, new from the prior MRI. 5. Background mild chronic small vessel ischemic changes within the cerebral white matter and pons. 6. Redemonstrated tiny chronic infarct within the left cerebellar hemisphere. 7. Mild-to-moderate generalized cerebral atrophy. 8. 6 mm soft tissue lesion arising from the right pterional scalp. Direct examination recommended. Electronically Signed   By: Jackey Loge D.O.   On: 11/07/2022 12:34    CT ANGIO HEAD NECK W WO CM   Result Date: 11/07/2022 CLINICAL DATA:  Right-sided gaze, slumped over EXAM: CT ANGIOGRAPHY HEAD AND NECK WITH AND WITHOUT CONTRAST TECHNIQUE: Multidetector CT imaging of the head and neck was performed using the standard protocol during bolus administration of intravenous contrast. Multiplanar CT image reconstructions and MIPs were obtained to evaluate the vascular anatomy. Carotid stenosis measurements (when applicable) are obtained utilizing NASCET criteria, using the distal internal carotid diameter as the denominator. RADIATION DOSE REDUCTION: This exam was performed according to the departmental dose-optimization program which includes automated exposure control, adjustment of the mA and/or kV according to patient size and/or use of iterative reconstruction technique. CONTRAST:  75mL OMNIPAQUE IOHEXOL 350 MG/ML SOLN COMPARISON:  Same-day noncontrast CT head, carotid Doppler 01/26/2019, CTA head/neck 11/15/2018 FINDINGS: Image quality is degraded by motion artifact particularly at the level of the carotid  bifurcations. CTA NECK FINDINGS Aortic arch: There is calcified plaque in the imaged aortic arch. The origins of the major branch vessels are patent. The subclavian arteries are patent to the level imaged. Right carotid system: The right common, internal, and external carotid arteries appear patent, without evidence of hemodynamically significant stenosis or occlusion there is no evidence of dissection or aneurysm, within the confines of above-described motion artifact. Left carotid system: The left common carotid artery is suboptimally evaluated due to motion and streak artifact from adjacent venous contrast. The bifurcation is also not well evaluated. The distal internal carotid artery in the neck is widely patent. There is no definite dissection or aneurysm, within the above-described confines. Vertebral arteries: There is mild stenosis of the origin of the right vertebral artery. The vertebral arteries are otherwise patent, without hemodynamically significant stenosis or occlusion. There is no evidence  of dissection or aneurysm. Skeleton: There is multilevel disc space narrowing and degenerative endplate change of the cervical spine. There is no visible canal hematoma. Other neck: The soft tissues of the neck are unremarkable. Upper chest: The imaged lung apices are clear. Review of the MIP images confirms the above findings CTA HEAD FINDINGS Anterior circulation: There is calcified plaque in the intracranial ICAs resulting in up to moderate to severe stenosis bilaterally. The bilateral MCAs and ACAs are patent, without proximal stenosis or occlusion. The anterior communicating artery is normal. There is no aneurysm or AVM. Posterior circulation: The bilateral V4 segments are patent with mild calcified plaque on the left. The basilar artery is patent. The major cerebellar arteries appear patent. The PCAs are patent, with short-segment moderate stenosis of the left P1/P2 segment (11-25). Posterior communicating  arteries are not identified. There is no aneurysm or AVM. Venous sinuses: As permitted by contrast timing, patent. Anatomic variants: None. Review of the MIP images confirms the above findings IMPRESSION: 1. Image quality in the neck is degraded by motion artifact as well as streak artifact from venous contamination. Specifically, the left common carotid artery and carotid bifurcation are not well evaluated. The imaged left internal carotid artery in the neck is widely patent. 2. Patent right carotid system without hemodynamically significant stenosis or occlusion. Patent vertebral arteries with mild stenosis at the origin on the right. 3. Calcified plaque in the intracranial ICAs resulting in moderate to severe stenosis bilaterally, and short-segment moderate stenosis of the left P1/P2 segment. Electronically Signed   By: Lesia Hausen M.D.   On: 11/07/2022 11:43    CT HEAD WO CONTRAST ( )   Result Date: 11/07/2022 CLINICAL DATA:  Fall on blood thinners EXAM: CT HEAD WITHOUT CONTRAST TECHNIQUE: Contiguous axial images were obtained from the base of the skull through the vertex without intravenous contrast. RADIATION DOSE REDUCTION: This exam was performed according to the departmental dose-optimization program which includes automated exposure control, adjustment of the mA and/or kV according to patient size and/or use of iterative reconstruction technique. COMPARISON:  05/17/2020 FINDINGS: Brain: No evidence of acute infarction, hemorrhage, hydrocephalus, extra-axial collection or mass lesion/mass effect. Periventricular and deep white matter hypodensity. New encephalomalacia of the right parietal vertex (4, image 20. Vascular: No hyperdense vessel or unexpected calcification. Skull: Normal. Negative for fracture or focal lesion. Sinuses/Orbits: No acute finding. Other: None. IMPRESSION: 1. No acute intracranial pathology. Small-vessel white matter disease. 2. New encephalomalacia of the right parietal vertex,  consistent with interval, although nonacute infarction. Electronically Signed   By: Jearld Lesch M.D.   On: 11/07/2022 10:55       Micro Results      No results found for this or any previous visit (from the past 240 hour(s)).   Today    Subjective      [name] today has no headache,no chest abdominal pain,no new weakness tingling or numbness, feels much better   Objective    Blood pressure (!) 117/58, pulse 72, temperature (!) 97.5 F (36.4 C), temperature source Oral, resp. rate 18, SpO2 97 %.     Intake/Output Summary (Last 24 hours) at 11/10/2022 0748 Last data filed at 11/10/2022 0438    Gross per 24 hour  Intake --  Output 1150 ml  Net -1150 ml      Exam   Awake Alert, No new F.N deficits,    Bee.AT,PERRAL Supple Neck,   Symmetrical Chest wall movement, Good air movement bilaterally, CTAB RRR,No Gallops,   +  ve B.Sounds, Abd Soft, Non tender,  No Cyanosis, Clubbing or edema     Data Review    Last Labs       Recent Labs  Lab 11/07/22 1050 11/08/22 0314 11/09/22 0141  WBC 12.8* 11.2* 9.7  HGB 12.6 13.2 14.6  HCT 41.2 41.5 46.2*  PLT 293 288 275  MCV 87.3 84.5 85.7  MCH 26.7 26.9 27.1  MCHC 30.6 31.8 31.6  RDW 15.2 15.6* 15.9*  LYMPHSABS 2.3  --   --   MONOABS 1.1*  --   --   EOSABS 0.1  --   --   BASOSABS 0.1  --   --         Last Labs        Recent Labs  Lab 11/07/22 1050 11/08/22 0313 11/08/22 0314 11/09/22 0141  NA 140  --  137 137  K 3.4*  --  3.6 4.0  CL 105  --  99 101  CO2 26  --  31 27  ANIONGAP 9  --  7 9  GLUCOSE 66*  --  55* 100*  BUN 10  --  11 17  CREATININE 1.02*  --  0.98 1.07*  AST 23  --   --   --   ALT 15  --   --   --   ALKPHOS 71  --   --   --   BILITOT 0.3  --   --   --   ALBUMIN 3.0*  --   --   --   INR 1.1  --   --   --   TSH  --  1.658  --   --   HGBA1C  --   --  8.2*  --   MG  --  2.2  --  2.2  CALCIUM 8.5*  --  8.7* 8.7*        Total Time in preparing paper work, data evaluation and todays exam -  35 minutes   Signature  -    Susa Raring M.D on 11/10/2022 at 7:48 AM   -  To page go to [email or web address]

## 2022-11-10 NOTE — TOC Progression Note (Addendum)
Transition of Care [facility]) - Progression Note    Patient Details  Name: [name] MRN: [id number] Date of Birth: Nov 11, 1945  Transition of [facility]) CM/SW Contact  Mearl Latin, LCSW Phone Number: 11/10/2022, 10:29 AM  Clinical Narrative:    10:29am-Patient does not have Medicare 3 inpatient midnight criteria. CSW conducting search for [facility] waiver bed. Awaiting response from [facility] as that is the only [facility] participating facility in [facility].   12:04 PM- CSW met with patient and made her aware that [facility] does not have any beds available and that placement would likely be in [place] due to bed availability. She reported agreement since she needs SNF and requested private room. [facility] able to accept patient today in a private room. Patient in agreement based off of closest distance to [place]. She requested CSW call her son, Berton Bon. CSW left him a Engineer, technical sales. She also requested CSW contact her friend Mayra Neer. [phone]) to make him aware so he can help her husband with the address of [facility]. CSW left him a secure voicemail as well. Patient requested [facility] for transport. CSW notified the [facility] waiver program and received confirmation of participation.   12:28pm-CSW received return call from patient's son, JT, and relayed discharge info and address.    Expected Discharge Plan: Skilled Nursing Facility Barriers to Discharge: Continued Medical Work up  Expected Discharge Plan and Services   Discharge Planning Services: NA Post Acute Care Choice: Skilled Nursing Facility (Patient desires SNF) Living arrangements for the past 2 months: Single Family Home Expected Discharge Date: 11/10/22               DME Arranged: N/A (Already has a cane, RW , BSC, shower chair and wheelchair) DME Agency: [employer]       HH Arranged: NA HH Agency: NA         Social Determinants of Health (SDOH) Interventions SDOH Screenings   Food Insecurity: No Food Insecurity (11/08/2022)   Housing: Low Risk  (11/08/2022)  Transportation Needs: No Transportation Needs (11/08/2022)  Utilities: Not At Risk (11/08/2022)  Alcohol Screen: Low Risk  (12/25/2018)  Depression (PHQ2-9): Low Risk  (12/25/2018)  Financial Resource Strain: Low Risk  (12/20/2018)  Physical Activity: Inactive (12/20/2018)  Social Connections: Moderately Integrated (12/25/2018)  Stress: Stress Concern Present (12/20/2018)  Tobacco Use: Low Risk  (11/10/2022)    Readmission Risk Interventions     No data to display

## 2022-11-10 NOTE — Progress Notes (Signed)
Speech Language Pathology Treatment: Dysphagia  Patient Details Name: [name] MRN: [id number] DOB: Dec 20, 1945 Today's Date: 11/10/2022 Time: 4098-1191 SLP Time Calculation (min) (ACUTE ONLY): 13 min  Assessment / Plan / Recommendation Clinical Impression  SLP follow-up with patient regarding her dysphagia and report of coughing with liquids.  She is sitting in her bed with her breakfast tray nearly empty.  Today patient endorses issues with coughing and strangling sometimes on liquids more than foods and states this has been ongoing for years.  Denies this being worse now nor her voice being more impaired than normal.  Patient denies having pneumonias nor requiring Heimlich maneuver.  Observed her consuming orange juice, 3 ounce Yale water challenge as well as coffee.  Drinking from the cup with orange juice with neck extension, patient with subtle cough.  Advised that she maintain head neutral position for liquid intake.  Given patient has rib fractures right now, if she does aspirate, she will be at increased risk of aspiration pneumonia.  SLP advised her to as such.  Patient does report that she took medicine previously with applesauce when at skilled nursing facilities, advised that she continue to do this given her chronic choking with liquids and increased pulmonary risk currently.  Patient agreeable to recommendations.  No SLP follow-up indicated as of now.  Thanks for this consult  HPI HPI: Pt is a 77 y.o. female who presented with syncope and fall striking her ribs on the commode. EMS noted her slumped over with gaze preference. Pt found to have 7-9th right rib fractures. MRI brain negative for acute changes. PMH: HTN, DM, hx CVA without residual deficits, dCHF, carotid PVD, and COPD.      SLP Plan  All goals met      Recommendations for follow up therapy are one component of a multi-disciplinary discharge planning process, led by the attending physician.  Recommendations may be updated  based on patient status, additional functional criteria and insurance authorization.    Recommendations  Diet recommendations: Regular;Thin liquid Liquids provided via: Cup;Straw Medication Administration:  (Large pills whole with applesauce if helpful but patient) Compensations: Slow rate;Small sips/bites;Other (Comment) (Rest if short of breath) Postural Changes and/or Swallow Maneuvers: Seated upright 90 degrees;Upright 30-60 min after meal                  Oral care BID     Dysphagia, unspecified (R13.10)     All goals met    Rolena Infante, MS [facility] SLP Acute Rehab Services Office [phone]  Chales Abrahams  11/10/2022, 9:07 AM

## 2022-11-13 DIAGNOSIS — Z7902 Long term (current) use of antithrombotics/antiplatelets: Secondary | ICD-10-CM | POA: Diagnosis not present

## 2022-11-13 DIAGNOSIS — G2581 Restless legs syndrome: Secondary | ICD-10-CM | POA: Diagnosis not present

## 2022-11-13 DIAGNOSIS — Z95818 Presence of other cardiac implants and grafts: Secondary | ICD-10-CM | POA: Diagnosis not present

## 2022-11-13 DIAGNOSIS — I69331 Monoplegia of upper limb following cerebral infarction affecting right dominant side: Secondary | ICD-10-CM | POA: Diagnosis not present

## 2022-11-13 DIAGNOSIS — E1142 Type 2 diabetes mellitus with diabetic polyneuropathy: Secondary | ICD-10-CM | POA: Diagnosis not present

## 2022-11-13 DIAGNOSIS — M545 Low back pain, unspecified: Secondary | ICD-10-CM | POA: Diagnosis not present

## 2022-11-13 DIAGNOSIS — Z9181 History of falling: Secondary | ICD-10-CM | POA: Diagnosis not present

## 2022-11-13 DIAGNOSIS — R32 Unspecified urinary incontinence: Secondary | ICD-10-CM | POA: Diagnosis not present

## 2022-11-13 DIAGNOSIS — I6521 Occlusion and stenosis of right carotid artery: Secondary | ICD-10-CM | POA: Diagnosis not present

## 2022-11-13 DIAGNOSIS — I5032 Chronic diastolic (congestive) heart failure: Secondary | ICD-10-CM | POA: Diagnosis not present

## 2022-11-13 DIAGNOSIS — L89309 Pressure ulcer of unspecified buttock, unspecified stage: Secondary | ICD-10-CM | POA: Diagnosis not present

## 2022-11-13 DIAGNOSIS — S2249XD Multiple fractures of ribs, unspecified side, subsequent encounter for fracture with routine healing: Secondary | ICD-10-CM | POA: Diagnosis not present

## 2022-11-13 DIAGNOSIS — R55 Syncope and collapse: Secondary | ICD-10-CM | POA: Diagnosis not present

## 2022-11-13 DIAGNOSIS — I110 Hypertensive heart disease with heart failure: Secondary | ICD-10-CM | POA: Diagnosis not present

## 2022-11-13 DIAGNOSIS — E1165 Type 2 diabetes mellitus with hyperglycemia: Secondary | ICD-10-CM | POA: Diagnosis not present

## 2022-11-13 DIAGNOSIS — I69354 Hemiplegia and hemiparesis following cerebral infarction affecting left non-dominant side: Secondary | ICD-10-CM | POA: Diagnosis not present

## 2022-11-13 DIAGNOSIS — E785 Hyperlipidemia, unspecified: Secondary | ICD-10-CM | POA: Diagnosis not present

## 2022-11-13 DIAGNOSIS — J42 Unspecified chronic bronchitis: Secondary | ICD-10-CM | POA: Diagnosis not present

## 2022-11-13 DIAGNOSIS — Z794 Long term (current) use of insulin: Secondary | ICD-10-CM | POA: Diagnosis not present

## 2022-11-13 NOTE — Telephone Encounter (Signed)
Monitor is not connected in Carelink as of 11/13/22.

## 2022-11-13 NOTE — Telephone Encounter (Signed)
Attempted to contact patient and spouse. No answer on any numbers. LM on number that has VM set up.

## 2022-11-14 DIAGNOSIS — I890 Lymphedema, not elsewhere classified: Secondary | ICD-10-CM | POA: Diagnosis not present

## 2022-11-14 DIAGNOSIS — L24A9 Irritant contact dermatitis due friction or contact with other specified body fluids: Secondary | ICD-10-CM | POA: Diagnosis not present

## 2022-11-15 NOTE — Telephone Encounter (Signed)
Spoke to patients son (JT) who advises the remote monitor was left at home, he will take it within the next day or two. Advised if we do not have the monitor we are not able to see what the device is recording or review any alerts that may transmit.  States he will take to the facility. Also advised if or when patient returns home the remote monitor should go with her and plug in beside her bed. Voiced understanding.  Patient is currently residing at [facility] [phone].    Loop Recorder Follow up   Is patient connected to Carelink/Latitude? No   Have steri-strips fallen off or been removed? No   Does the patient need in office follow up? No   Please continue to monitor your cardiac device site for redness, swelling, and drainage. Call the device clinic at [phone] if you experience these symptoms, fever/chills, or have questions about your device.   Remote monitoring is used to monitor your cardiac device from home. This monitoring is scheduled every month by our office. It allows [place] to keep an eye on the functioning of your device to ensure it is working properly.

## 2022-11-16 ENCOUNTER — Other Ambulatory Visit: Payer: Self-pay | Admitting: *Deleted

## 2022-11-16 NOTE — Patient Outreach (Signed)
Mrs. [name] recently admitted to [facility] under [facility] SNF ACO [place] waiver. Screening for potential [facility] care coordination services as benefit of health plan and Primary Care Provider.   Facility site visit to [facility]. Met with [name], Haematologist and [name], Sales promotion account executive. Mrs. [name] is from home with spouse who provides limited assistance. Mrs. [name] needs to be as independent as possible before returning home per therapy. Mrs. [name] is currently mod/max with ADLs. Mrs. [name] has generalized weakness and issues with labile blood sugars. Anticipated transition plan is to return home with spouse.  Went to bedside to speak with Mrs. [name]. However, Mrs. [name] reports she is unwell. States she feels nauseous. Writer went to get nursing staff to assist. Left [facility] brochure and contact information at bedside.   Will follow up at another time.     Raiford Noble, MSN, RN,BSN [facility] Post Acute Care Coordinator [phone] (Direct dial)

## 2022-11-17 DIAGNOSIS — Z794 Long term (current) use of insulin: Secondary | ICD-10-CM | POA: Diagnosis not present

## 2022-11-17 DIAGNOSIS — Z66 Do not resuscitate: Secondary | ICD-10-CM | POA: Diagnosis not present

## 2022-11-17 DIAGNOSIS — R55 Syncope and collapse: Secondary | ICD-10-CM | POA: Diagnosis not present

## 2022-11-17 DIAGNOSIS — I10 Essential (primary) hypertension: Secondary | ICD-10-CM | POA: Diagnosis not present

## 2022-11-17 DIAGNOSIS — Z7189 Other specified counseling: Secondary | ICD-10-CM | POA: Diagnosis not present

## 2022-11-17 DIAGNOSIS — E1165 Type 2 diabetes mellitus with hyperglycemia: Secondary | ICD-10-CM | POA: Diagnosis not present

## 2022-11-17 DIAGNOSIS — Z9181 History of falling: Secondary | ICD-10-CM | POA: Diagnosis not present

## 2022-11-17 DIAGNOSIS — S2249XD Multiple fractures of ribs, unspecified side, subsequent encounter for fracture with routine healing: Secondary | ICD-10-CM | POA: Diagnosis not present

## 2022-11-20 DIAGNOSIS — K5900 Constipation, unspecified: Secondary | ICD-10-CM | POA: Diagnosis not present

## 2022-11-20 DIAGNOSIS — F4321 Adjustment disorder with depressed mood: Secondary | ICD-10-CM | POA: Diagnosis not present

## 2022-11-20 DIAGNOSIS — R2681 Unsteadiness on feet: Secondary | ICD-10-CM | POA: Diagnosis not present

## 2022-11-20 DIAGNOSIS — I679 Cerebrovascular disease, unspecified: Secondary | ICD-10-CM | POA: Diagnosis not present

## 2022-11-20 DIAGNOSIS — Z9181 History of falling: Secondary | ICD-10-CM | POA: Diagnosis not present

## 2022-11-20 DIAGNOSIS — R443 Hallucinations, unspecified: Secondary | ICD-10-CM | POA: Diagnosis not present

## 2022-11-20 DIAGNOSIS — R52 Pain, unspecified: Secondary | ICD-10-CM | POA: Diagnosis not present

## 2022-11-20 DIAGNOSIS — R55 Syncope and collapse: Secondary | ICD-10-CM | POA: Diagnosis not present

## 2022-11-21 ENCOUNTER — Other Ambulatory Visit: Payer: Medicare Other

## 2022-11-21 DIAGNOSIS — L24A9 Irritant contact dermatitis due friction or contact with other specified body fluids: Secondary | ICD-10-CM | POA: Diagnosis not present

## 2022-11-21 DIAGNOSIS — Z8659 Personal history of other mental and behavioral disorders: Secondary | ICD-10-CM | POA: Diagnosis not present

## 2022-11-21 DIAGNOSIS — E1165 Type 2 diabetes mellitus with hyperglycemia: Secondary | ICD-10-CM | POA: Diagnosis not present

## 2022-11-21 DIAGNOSIS — S2249XD Multiple fractures of ribs, unspecified side, subsequent encounter for fracture with routine healing: Secondary | ICD-10-CM | POA: Diagnosis not present

## 2022-11-21 DIAGNOSIS — Z9181 History of falling: Secondary | ICD-10-CM | POA: Diagnosis not present

## 2022-11-21 DIAGNOSIS — I110 Hypertensive heart disease with heart failure: Secondary | ICD-10-CM | POA: Diagnosis not present

## 2022-11-21 DIAGNOSIS — I5032 Chronic diastolic (congestive) heart failure: Secondary | ICD-10-CM | POA: Diagnosis not present

## 2022-11-21 DIAGNOSIS — E785 Hyperlipidemia, unspecified: Secondary | ICD-10-CM | POA: Diagnosis not present

## 2022-11-21 DIAGNOSIS — I890 Lymphedema, not elsewhere classified: Secondary | ICD-10-CM | POA: Diagnosis not present

## 2022-11-21 NOTE — Telephone Encounter (Signed)
I spoke with the patient and she has the monitor in a closet. She agreed to have the nurse to get the monitor out the closet and plug it in.

## 2022-11-22 DIAGNOSIS — R52 Pain, unspecified: Secondary | ICD-10-CM | POA: Diagnosis not present

## 2022-11-22 DIAGNOSIS — R443 Hallucinations, unspecified: Secondary | ICD-10-CM | POA: Diagnosis not present

## 2022-11-22 DIAGNOSIS — R55 Syncope and collapse: Secondary | ICD-10-CM | POA: Diagnosis not present

## 2022-11-22 DIAGNOSIS — K5900 Constipation, unspecified: Secondary | ICD-10-CM | POA: Diagnosis not present

## 2022-11-22 DIAGNOSIS — R2681 Unsteadiness on feet: Secondary | ICD-10-CM | POA: Diagnosis not present

## 2022-11-22 DIAGNOSIS — Z9181 History of falling: Secondary | ICD-10-CM | POA: Diagnosis not present

## 2022-11-22 DIAGNOSIS — I679 Cerebrovascular disease, unspecified: Secondary | ICD-10-CM | POA: Diagnosis not present

## 2022-11-23 ENCOUNTER — Other Ambulatory Visit: Payer: Self-pay | Admitting: *Deleted

## 2022-11-23 NOTE — Patient Outreach (Signed)
Mrs. Kercheval resides in [facility] skilled nursing facility. Screening for potential care coordination services as benefit of health plan and Primary Care Provider.   Facility site visit to [facility]. Met with therapy manager and social work team. Mrs. Pellicano will return home with spouse. Progressing well with therapy. Therapy reports Mrs. Rennie had syncopal episodes at home but has not had any syncopal episodes in SNF thus far.   Met with Mrs. Salonga at beside. States she feels better than last week when Clinical research associate visited. States it was thought that theTramadol was causing her nausea. Reports she is feeling better now. Mentions she is no longer taking Tramadol.   Confirms plan is to return home with spouse. Mrs. Givler reports her husband is very hard of hearing. Reports her nephew Brett Canales should be contacted for facility meetings. States her son JT works night shift and sleeps during the day. Mrs. Apolinar reports her sister comes once a week to help with light house work and Pharmacologist. Mrs. Rafuse reports she is looking for someone to pay 10 dollars a hour to assist with housework and meals. Mrs. Kissoon states she is not interested in mobile meals. Confirms either her son or nephew or "somebody" will take her to MD appointments. Denies any concerns with meals or transportation. Discussed [facility] follow up for medication review after she reports she has had medication adjustments. Mrs. Betten declines this offer at the time. Reports she is familiar with her medications. Mrs. Fitzgerald does report her blood sugars can be labile. Reports medication adjustments have been made for DM management.   Mrs. Vinas reports she is wearing a heart monitor (loop recorder) due to her "passing out spells" at home.  Mrs. Petrik endorses she has a life alert system at home. States she just needs to activate it. States she thinks her husband has activated however. Reports life alert system would have been helpful when she fell at home.   Mrs.  Dohrmann agreeable to scheduling PCP appointment upon SNF discharge. States she will need to make schedule according to family's availability to take her.   Discussed [facility] care coordination services. Mrs. Lagares is agreeable. Discussed Clinical research associate will continue to follow and refer for RN care coordination services upon SNF discharge.   Provided [facility] Care Management brochure, writer's contact information, and PCP reminder card.  Mrs. Novitsky reports home phone is the best contact telephone number for her when she gets home at [phone].  Will continue to follow.   Raiford Noble, MSN, RN,BSN [facility] Post Acute Care Coordinator [phone] (Direct dial)

## 2022-11-27 DIAGNOSIS — Z9181 History of falling: Secondary | ICD-10-CM | POA: Diagnosis not present

## 2022-11-27 DIAGNOSIS — K5900 Constipation, unspecified: Secondary | ICD-10-CM | POA: Diagnosis not present

## 2022-11-27 DIAGNOSIS — I679 Cerebrovascular disease, unspecified: Secondary | ICD-10-CM | POA: Diagnosis not present

## 2022-11-27 DIAGNOSIS — R443 Hallucinations, unspecified: Secondary | ICD-10-CM | POA: Diagnosis not present

## 2022-11-27 DIAGNOSIS — R52 Pain, unspecified: Secondary | ICD-10-CM | POA: Diagnosis not present

## 2022-11-27 DIAGNOSIS — R2681 Unsteadiness on feet: Secondary | ICD-10-CM | POA: Diagnosis not present

## 2022-11-27 DIAGNOSIS — R55 Syncope and collapse: Secondary | ICD-10-CM | POA: Diagnosis not present

## 2022-11-27 NOTE — Telephone Encounter (Signed)
Remote updated 11/22/2022

## 2022-11-28 DIAGNOSIS — L24A9 Irritant contact dermatitis due friction or contact with other specified body fluids: Secondary | ICD-10-CM | POA: Diagnosis not present

## 2022-11-28 DIAGNOSIS — I5032 Chronic diastolic (congestive) heart failure: Secondary | ICD-10-CM | POA: Diagnosis not present

## 2022-11-28 DIAGNOSIS — E1165 Type 2 diabetes mellitus with hyperglycemia: Secondary | ICD-10-CM | POA: Diagnosis not present

## 2022-11-28 DIAGNOSIS — I890 Lymphedema, not elsewhere classified: Secondary | ICD-10-CM | POA: Diagnosis not present

## 2022-11-28 DIAGNOSIS — I69392 Facial weakness following cerebral infarction: Secondary | ICD-10-CM | POA: Diagnosis not present

## 2022-11-30 DIAGNOSIS — R52 Pain, unspecified: Secondary | ICD-10-CM | POA: Diagnosis not present

## 2022-11-30 DIAGNOSIS — Z9181 History of falling: Secondary | ICD-10-CM | POA: Diagnosis not present

## 2022-11-30 DIAGNOSIS — R443 Hallucinations, unspecified: Secondary | ICD-10-CM | POA: Diagnosis not present

## 2022-11-30 DIAGNOSIS — K5900 Constipation, unspecified: Secondary | ICD-10-CM | POA: Diagnosis not present

## 2022-11-30 DIAGNOSIS — I679 Cerebrovascular disease, unspecified: Secondary | ICD-10-CM | POA: Diagnosis not present

## 2022-11-30 DIAGNOSIS — R2681 Unsteadiness on feet: Secondary | ICD-10-CM | POA: Diagnosis not present

## 2022-11-30 DIAGNOSIS — R55 Syncope and collapse: Secondary | ICD-10-CM | POA: Diagnosis not present

## 2022-12-04 DIAGNOSIS — R52 Pain, unspecified: Secondary | ICD-10-CM | POA: Diagnosis not present

## 2022-12-04 DIAGNOSIS — Z9181 History of falling: Secondary | ICD-10-CM | POA: Diagnosis not present

## 2022-12-04 DIAGNOSIS — K5900 Constipation, unspecified: Secondary | ICD-10-CM | POA: Diagnosis not present

## 2022-12-04 DIAGNOSIS — R443 Hallucinations, unspecified: Secondary | ICD-10-CM | POA: Diagnosis not present

## 2022-12-04 DIAGNOSIS — R55 Syncope and collapse: Secondary | ICD-10-CM | POA: Diagnosis not present

## 2022-12-04 DIAGNOSIS — R2681 Unsteadiness on feet: Secondary | ICD-10-CM | POA: Diagnosis not present

## 2022-12-04 DIAGNOSIS — I679 Cerebrovascular disease, unspecified: Secondary | ICD-10-CM | POA: Diagnosis not present

## 2022-12-05 ENCOUNTER — Ambulatory Visit: Payer: Medicare Other | Admitting: Cardiology

## 2022-12-06 DIAGNOSIS — R52 Pain, unspecified: Secondary | ICD-10-CM | POA: Diagnosis not present

## 2022-12-06 DIAGNOSIS — R2681 Unsteadiness on feet: Secondary | ICD-10-CM | POA: Diagnosis not present

## 2022-12-06 DIAGNOSIS — R55 Syncope and collapse: Secondary | ICD-10-CM | POA: Diagnosis not present

## 2022-12-06 DIAGNOSIS — Z9181 History of falling: Secondary | ICD-10-CM | POA: Diagnosis not present

## 2022-12-06 DIAGNOSIS — I679 Cerebrovascular disease, unspecified: Secondary | ICD-10-CM | POA: Diagnosis not present

## 2022-12-11 DIAGNOSIS — R52 Pain, unspecified: Secondary | ICD-10-CM | POA: Diagnosis not present

## 2022-12-11 DIAGNOSIS — Z9181 History of falling: Secondary | ICD-10-CM | POA: Diagnosis not present

## 2022-12-11 DIAGNOSIS — I679 Cerebrovascular disease, unspecified: Secondary | ICD-10-CM | POA: Diagnosis not present

## 2022-12-11 DIAGNOSIS — R55 Syncope and collapse: Secondary | ICD-10-CM | POA: Diagnosis not present

## 2022-12-11 DIAGNOSIS — R2681 Unsteadiness on feet: Secondary | ICD-10-CM | POA: Diagnosis not present

## 2022-12-12 ENCOUNTER — Ambulatory Visit (INDEPENDENT_AMBULATORY_CARE_PROVIDER_SITE_OTHER): Payer: Medicare Other

## 2022-12-12 DIAGNOSIS — R55 Syncope and collapse: Secondary | ICD-10-CM | POA: Diagnosis not present

## 2022-12-13 DIAGNOSIS — H35371 Puckering of macula, right eye: Secondary | ICD-10-CM | POA: Diagnosis not present

## 2022-12-13 DIAGNOSIS — H35341 Macular cyst, hole, or pseudohole, right eye: Secondary | ICD-10-CM | POA: Diagnosis not present

## 2022-12-13 DIAGNOSIS — Z794 Long term (current) use of insulin: Secondary | ICD-10-CM | POA: Diagnosis not present

## 2022-12-13 DIAGNOSIS — Z7984 Long term (current) use of oral hypoglycemic drugs: Secondary | ICD-10-CM | POA: Diagnosis not present

## 2022-12-13 DIAGNOSIS — H524 Presbyopia: Secondary | ICD-10-CM | POA: Diagnosis not present

## 2022-12-13 DIAGNOSIS — H5203 Hypermetropia, bilateral: Secondary | ICD-10-CM | POA: Diagnosis not present

## 2022-12-13 DIAGNOSIS — H52223 Regular astigmatism, bilateral: Secondary | ICD-10-CM | POA: Diagnosis not present

## 2022-12-13 DIAGNOSIS — H25813 Combined forms of age-related cataract, bilateral: Secondary | ICD-10-CM | POA: Diagnosis not present

## 2022-12-13 DIAGNOSIS — E119 Type 2 diabetes mellitus without complications: Secondary | ICD-10-CM | POA: Diagnosis not present

## 2022-12-13 DIAGNOSIS — H35373 Puckering of macula, bilateral: Secondary | ICD-10-CM | POA: Diagnosis not present

## 2022-12-15 DIAGNOSIS — R52 Pain, unspecified: Secondary | ICD-10-CM | POA: Diagnosis not present

## 2022-12-15 DIAGNOSIS — R55 Syncope and collapse: Secondary | ICD-10-CM | POA: Diagnosis not present

## 2022-12-15 DIAGNOSIS — R2681 Unsteadiness on feet: Secondary | ICD-10-CM | POA: Diagnosis not present

## 2022-12-15 DIAGNOSIS — Z9181 History of falling: Secondary | ICD-10-CM | POA: Diagnosis not present

## 2022-12-15 DIAGNOSIS — I679 Cerebrovascular disease, unspecified: Secondary | ICD-10-CM | POA: Diagnosis not present

## 2022-12-18 DIAGNOSIS — R2681 Unsteadiness on feet: Secondary | ICD-10-CM | POA: Diagnosis not present

## 2022-12-18 DIAGNOSIS — R52 Pain, unspecified: Secondary | ICD-10-CM | POA: Diagnosis not present

## 2022-12-18 DIAGNOSIS — R55 Syncope and collapse: Secondary | ICD-10-CM | POA: Diagnosis not present

## 2022-12-18 DIAGNOSIS — Z9181 History of falling: Secondary | ICD-10-CM | POA: Diagnosis not present

## 2022-12-18 DIAGNOSIS — I679 Cerebrovascular disease, unspecified: Secondary | ICD-10-CM | POA: Diagnosis not present

## 2022-12-20 DIAGNOSIS — R52 Pain, unspecified: Secondary | ICD-10-CM | POA: Diagnosis not present

## 2022-12-20 DIAGNOSIS — I679 Cerebrovascular disease, unspecified: Secondary | ICD-10-CM | POA: Diagnosis not present

## 2022-12-20 DIAGNOSIS — Z9181 History of falling: Secondary | ICD-10-CM | POA: Diagnosis not present

## 2022-12-20 DIAGNOSIS — R2681 Unsteadiness on feet: Secondary | ICD-10-CM | POA: Diagnosis not present

## 2022-12-20 DIAGNOSIS — R55 Syncope and collapse: Secondary | ICD-10-CM | POA: Diagnosis not present

## 2022-12-21 DIAGNOSIS — I110 Hypertensive heart disease with heart failure: Secondary | ICD-10-CM | POA: Diagnosis not present

## 2022-12-21 DIAGNOSIS — Z794 Long term (current) use of insulin: Secondary | ICD-10-CM | POA: Diagnosis not present

## 2022-12-21 DIAGNOSIS — I509 Heart failure, unspecified: Secondary | ICD-10-CM | POA: Diagnosis not present

## 2022-12-21 DIAGNOSIS — J42 Unspecified chronic bronchitis: Secondary | ICD-10-CM | POA: Diagnosis not present

## 2022-12-21 DIAGNOSIS — E11649 Type 2 diabetes mellitus with hypoglycemia without coma: Secondary | ICD-10-CM | POA: Diagnosis not present

## 2022-12-21 DIAGNOSIS — Z95818 Presence of other cardiac implants and grafts: Secondary | ICD-10-CM | POA: Diagnosis not present

## 2022-12-25 DIAGNOSIS — Z9181 History of falling: Secondary | ICD-10-CM | POA: Diagnosis not present

## 2022-12-25 DIAGNOSIS — R52 Pain, unspecified: Secondary | ICD-10-CM | POA: Diagnosis not present

## 2022-12-25 DIAGNOSIS — I679 Cerebrovascular disease, unspecified: Secondary | ICD-10-CM | POA: Diagnosis not present

## 2022-12-25 DIAGNOSIS — R55 Syncope and collapse: Secondary | ICD-10-CM | POA: Diagnosis not present

## 2022-12-25 DIAGNOSIS — R2681 Unsteadiness on feet: Secondary | ICD-10-CM | POA: Diagnosis not present

## 2022-12-26 ENCOUNTER — Other Ambulatory Visit: Payer: Self-pay | Admitting: *Deleted

## 2022-12-26 NOTE — Patient Outreach (Incomplete)
Per [facility] [name]  resides in [facility] skilled nursing facility. Screening for potential care coordination services as benefit of health plan and Primary Care Provider.   Update received from

## 2022-12-27 DIAGNOSIS — R55 Syncope and collapse: Secondary | ICD-10-CM | POA: Diagnosis not present

## 2022-12-27 DIAGNOSIS — R52 Pain, unspecified: Secondary | ICD-10-CM | POA: Diagnosis not present

## 2022-12-27 DIAGNOSIS — R2681 Unsteadiness on feet: Secondary | ICD-10-CM | POA: Diagnosis not present

## 2022-12-27 DIAGNOSIS — Z9181 History of falling: Secondary | ICD-10-CM | POA: Diagnosis not present

## 2022-12-27 DIAGNOSIS — I679 Cerebrovascular disease, unspecified: Secondary | ICD-10-CM | POA: Diagnosis not present

## 2022-12-28 NOTE — Patient Outreach (Signed)
Mrs. Lindblom resides in [facility] skilled nursing facility. Screening for potential care coordination services as a benefit of health plan and primary care provider.  Collaboration with Finis Bud social worker. Mrs. Mehan plans to return home. Will have [facility].   Raiford Noble, MSN, RN,BSN Post Acute Care Coordinator [phone] (Direct dial)

## 2022-12-28 NOTE — Progress Notes (Signed)
Carelink Summary Report / Loop Recorder 

## 2023-01-01 DIAGNOSIS — Z9181 History of falling: Secondary | ICD-10-CM | POA: Diagnosis not present

## 2023-01-01 DIAGNOSIS — I679 Cerebrovascular disease, unspecified: Secondary | ICD-10-CM | POA: Diagnosis not present

## 2023-01-01 DIAGNOSIS — R2681 Unsteadiness on feet: Secondary | ICD-10-CM | POA: Diagnosis not present

## 2023-01-01 DIAGNOSIS — R55 Syncope and collapse: Secondary | ICD-10-CM | POA: Diagnosis not present

## 2023-01-01 DIAGNOSIS — R52 Pain, unspecified: Secondary | ICD-10-CM | POA: Diagnosis not present

## 2023-01-03 DIAGNOSIS — R55 Syncope and collapse: Secondary | ICD-10-CM | POA: Diagnosis not present

## 2023-01-03 DIAGNOSIS — R52 Pain, unspecified: Secondary | ICD-10-CM | POA: Diagnosis not present

## 2023-01-03 DIAGNOSIS — Z9181 History of falling: Secondary | ICD-10-CM | POA: Diagnosis not present

## 2023-01-03 DIAGNOSIS — R2681 Unsteadiness on feet: Secondary | ICD-10-CM | POA: Diagnosis not present

## 2023-01-03 DIAGNOSIS — I679 Cerebrovascular disease, unspecified: Secondary | ICD-10-CM | POA: Diagnosis not present

## 2023-01-04 DIAGNOSIS — I509 Heart failure, unspecified: Secondary | ICD-10-CM | POA: Diagnosis not present

## 2023-01-04 DIAGNOSIS — I959 Hypotension, unspecified: Secondary | ICD-10-CM | POA: Diagnosis not present

## 2023-01-04 DIAGNOSIS — I779 Disorder of arteries and arterioles, unspecified: Secondary | ICD-10-CM | POA: Diagnosis not present

## 2023-01-04 DIAGNOSIS — Z794 Long term (current) use of insulin: Secondary | ICD-10-CM | POA: Diagnosis not present

## 2023-01-04 DIAGNOSIS — R7989 Other specified abnormal findings of blood chemistry: Secondary | ICD-10-CM | POA: Diagnosis not present

## 2023-01-04 DIAGNOSIS — E11649 Type 2 diabetes mellitus with hypoglycemia without coma: Secondary | ICD-10-CM | POA: Diagnosis not present

## 2023-01-10 DIAGNOSIS — I69351 Hemiplegia and hemiparesis following cerebral infarction affecting right dominant side: Secondary | ICD-10-CM | POA: Diagnosis not present

## 2023-01-10 DIAGNOSIS — R55 Syncope and collapse: Secondary | ICD-10-CM | POA: Diagnosis not present

## 2023-01-10 DIAGNOSIS — R52 Pain, unspecified: Secondary | ICD-10-CM | POA: Diagnosis not present

## 2023-01-10 DIAGNOSIS — Z9181 History of falling: Secondary | ICD-10-CM | POA: Diagnosis not present

## 2023-01-10 DIAGNOSIS — G2581 Restless legs syndrome: Secondary | ICD-10-CM | POA: Diagnosis not present

## 2023-01-10 DIAGNOSIS — M545 Low back pain, unspecified: Secondary | ICD-10-CM | POA: Diagnosis not present

## 2023-01-10 DIAGNOSIS — R2681 Unsteadiness on feet: Secondary | ICD-10-CM | POA: Diagnosis not present

## 2023-01-10 DIAGNOSIS — S2249XD Multiple fractures of ribs, unspecified side, subsequent encounter for fracture with routine healing: Secondary | ICD-10-CM | POA: Diagnosis not present

## 2023-01-10 DIAGNOSIS — Z8679 Personal history of other diseases of the circulatory system: Secondary | ICD-10-CM | POA: Diagnosis not present

## 2023-01-10 DIAGNOSIS — I679 Cerebrovascular disease, unspecified: Secondary | ICD-10-CM | POA: Diagnosis not present

## 2023-01-11 ENCOUNTER — Other Ambulatory Visit: Payer: Self-pay | Admitting: *Deleted

## 2023-01-11 ENCOUNTER — Telehealth: Payer: Self-pay | Admitting: *Deleted

## 2023-01-11 DIAGNOSIS — I5032 Chronic diastolic (congestive) heart failure: Secondary | ICD-10-CM

## 2023-01-11 DIAGNOSIS — I110 Hypertensive heart disease with heart failure: Secondary | ICD-10-CM | POA: Diagnosis not present

## 2023-01-11 DIAGNOSIS — I69331 Monoplegia of upper limb following cerebral infarction affecting right dominant side: Secondary | ICD-10-CM | POA: Diagnosis not present

## 2023-01-11 DIAGNOSIS — I69354 Hemiplegia and hemiparesis following cerebral infarction affecting left non-dominant side: Secondary | ICD-10-CM | POA: Diagnosis not present

## 2023-01-11 DIAGNOSIS — E785 Hyperlipidemia, unspecified: Secondary | ICD-10-CM | POA: Diagnosis not present

## 2023-01-11 DIAGNOSIS — Z792 Long term (current) use of antibiotics: Secondary | ICD-10-CM | POA: Diagnosis not present

## 2023-01-11 DIAGNOSIS — Z9181 History of falling: Secondary | ICD-10-CM | POA: Diagnosis not present

## 2023-01-11 DIAGNOSIS — R32 Unspecified urinary incontinence: Secondary | ICD-10-CM | POA: Diagnosis not present

## 2023-01-11 DIAGNOSIS — E1165 Type 2 diabetes mellitus with hyperglycemia: Secondary | ICD-10-CM | POA: Diagnosis not present

## 2023-01-11 DIAGNOSIS — J4489 Other specified chronic obstructive pulmonary disease: Secondary | ICD-10-CM | POA: Diagnosis not present

## 2023-01-11 DIAGNOSIS — L89309 Pressure ulcer of unspecified buttock, unspecified stage: Secondary | ICD-10-CM | POA: Diagnosis not present

## 2023-01-11 DIAGNOSIS — R41841 Cognitive communication deficit: Secondary | ICD-10-CM | POA: Diagnosis not present

## 2023-01-11 DIAGNOSIS — Z79891 Long term (current) use of opiate analgesic: Secondary | ICD-10-CM | POA: Diagnosis not present

## 2023-01-11 DIAGNOSIS — G2581 Restless legs syndrome: Secondary | ICD-10-CM | POA: Diagnosis not present

## 2023-01-11 DIAGNOSIS — R296 Repeated falls: Secondary | ICD-10-CM | POA: Diagnosis not present

## 2023-01-11 DIAGNOSIS — I2510 Atherosclerotic heart disease of native coronary artery without angina pectoris: Secondary | ICD-10-CM | POA: Diagnosis not present

## 2023-01-11 DIAGNOSIS — E1142 Type 2 diabetes mellitus with diabetic polyneuropathy: Secondary | ICD-10-CM | POA: Diagnosis not present

## 2023-01-11 DIAGNOSIS — M5136 Other intervertebral disc degeneration, lumbar region: Secondary | ICD-10-CM | POA: Diagnosis not present

## 2023-01-11 DIAGNOSIS — Z794 Long term (current) use of insulin: Secondary | ICD-10-CM | POA: Diagnosis not present

## 2023-01-11 DIAGNOSIS — S2241XD Multiple fractures of ribs, right side, subsequent encounter for fracture with routine healing: Secondary | ICD-10-CM | POA: Diagnosis not present

## 2023-01-11 DIAGNOSIS — K5900 Constipation, unspecified: Secondary | ICD-10-CM | POA: Diagnosis not present

## 2023-01-11 DIAGNOSIS — M810 Age-related osteoporosis without current pathological fracture: Secondary | ICD-10-CM | POA: Diagnosis not present

## 2023-01-11 DIAGNOSIS — E1151 Type 2 diabetes mellitus with diabetic peripheral angiopathy without gangrene: Secondary | ICD-10-CM | POA: Diagnosis not present

## 2023-01-11 DIAGNOSIS — Z9049 Acquired absence of other specified parts of digestive tract: Secondary | ICD-10-CM | POA: Diagnosis not present

## 2023-01-11 NOTE — Patient Outreach (Signed)
Per [facility] [name] discharged from [facility] skilled nursing facility on 01/10/23. Screening for potential care coordination services as benefit of health plan and Primary Care Provider.  [facility] social worker, Everardo Pacific previously indicated [name] will return home with spouse. Will have [facility].   [name] previously agreed to care coordination services.   Referral made for RN care coordination services.   Raiford Noble, MSN, RN,BSN Post Acute Care Coordinator [phone] (Direct dial)

## 2023-01-11 NOTE — Progress Notes (Signed)
  Care Coordination  Outreach Note  01/11/2023 Name: [name] MRN: [id number] DOB: Dec 02, 1945   Care Coordination Outreach Attempts: An unsuccessful telephone outreach was attempted today to offer the patient information about available care coordination services.  Follow Up Plan:  Additional outreach attempts will be made to offer the patient care coordination information and services.   Encounter Outcome:  No Answer  Burman Nieves, CCMA Care Coordination Care Guide Direct Dial: [phone]

## 2023-01-12 NOTE — Progress Notes (Signed)
  Care Coordination   Note   01/12/2023 Name: [name] MRN: [id number] DOB: 31-Jan-1946  [name] is a 77 y.o. year old female who sees Paulina Fusi, MD for primary care. I reached out to [employer] by phone today to offer care coordination services.  [name] was given information about Care Coordination services today including:   The Care Coordination services include support from the care team which includes your Nurse Coordinator, Clinical Social Worker, or Pharmacist.  The Care Coordination team is here to help remove barriers to the health concerns and goals most important to you. Care Coordination services are voluntary, and the patient may decline or stop services at any time by request to their care team member.   Care Coordination Consent Status: Patient agreed to services and verbal consent obtained.   Follow up plan:  Telephone appointment with care coordination team member scheduled for:  01/22/2023  Encounter Outcome:  Patient Scheduled from referral   Burman Nieves, [facility] Care Coordination Care Guide Direct Dial: [phone]

## 2023-01-13 DIAGNOSIS — I6389 Other cerebral infarction: Secondary | ICD-10-CM | POA: Diagnosis not present

## 2023-01-13 DIAGNOSIS — I6523 Occlusion and stenosis of bilateral carotid arteries: Secondary | ICD-10-CM | POA: Diagnosis not present

## 2023-01-13 DIAGNOSIS — R262 Difficulty in walking, not elsewhere classified: Secondary | ICD-10-CM | POA: Diagnosis not present

## 2023-01-13 DIAGNOSIS — F32A Depression, unspecified: Secondary | ICD-10-CM | POA: Diagnosis not present

## 2023-01-13 DIAGNOSIS — G319 Degenerative disease of nervous system, unspecified: Secondary | ICD-10-CM | POA: Diagnosis not present

## 2023-01-13 DIAGNOSIS — J449 Chronic obstructive pulmonary disease, unspecified: Secondary | ICD-10-CM | POA: Diagnosis not present

## 2023-01-13 DIAGNOSIS — R918 Other nonspecific abnormal finding of lung field: Secondary | ICD-10-CM | POA: Diagnosis not present

## 2023-01-13 DIAGNOSIS — Z7902 Long term (current) use of antithrombotics/antiplatelets: Secondary | ICD-10-CM | POA: Diagnosis not present

## 2023-01-13 DIAGNOSIS — M6281 Muscle weakness (generalized): Secondary | ICD-10-CM | POA: Diagnosis not present

## 2023-01-13 DIAGNOSIS — G9341 Metabolic encephalopathy: Secondary | ICD-10-CM | POA: Diagnosis not present

## 2023-01-13 DIAGNOSIS — Z66 Do not resuscitate: Secondary | ICD-10-CM | POA: Diagnosis not present

## 2023-01-13 DIAGNOSIS — Z87440 Personal history of urinary (tract) infections: Secondary | ICD-10-CM | POA: Diagnosis not present

## 2023-01-13 DIAGNOSIS — Z1623 Resistance to quinolones and fluoroquinolones: Secondary | ICD-10-CM | POA: Diagnosis not present

## 2023-01-13 DIAGNOSIS — M1990 Unspecified osteoarthritis, unspecified site: Secondary | ICD-10-CM | POA: Diagnosis not present

## 2023-01-13 DIAGNOSIS — Z1611 Resistance to penicillins: Secondary | ICD-10-CM | POA: Diagnosis not present

## 2023-01-13 DIAGNOSIS — I639 Cerebral infarction, unspecified: Secondary | ICD-10-CM | POA: Diagnosis not present

## 2023-01-13 DIAGNOSIS — R2681 Unsteadiness on feet: Secondary | ICD-10-CM | POA: Diagnosis not present

## 2023-01-13 DIAGNOSIS — I252 Old myocardial infarction: Secondary | ICD-10-CM | POA: Diagnosis not present

## 2023-01-13 DIAGNOSIS — Z7401 Bed confinement status: Secondary | ICD-10-CM | POA: Diagnosis not present

## 2023-01-13 DIAGNOSIS — R1312 Dysphagia, oropharyngeal phase: Secondary | ICD-10-CM | POA: Diagnosis not present

## 2023-01-13 DIAGNOSIS — Z741 Need for assistance with personal care: Secondary | ICD-10-CM | POA: Diagnosis not present

## 2023-01-13 DIAGNOSIS — R404 Transient alteration of awareness: Secondary | ICD-10-CM | POA: Diagnosis not present

## 2023-01-13 DIAGNOSIS — Z79899 Other long term (current) drug therapy: Secondary | ICD-10-CM | POA: Diagnosis not present

## 2023-01-13 DIAGNOSIS — E7800 Pure hypercholesterolemia, unspecified: Secondary | ICD-10-CM | POA: Diagnosis not present

## 2023-01-13 DIAGNOSIS — I110 Hypertensive heart disease with heart failure: Secondary | ICD-10-CM | POA: Diagnosis not present

## 2023-01-13 DIAGNOSIS — R4182 Altered mental status, unspecified: Secondary | ICD-10-CM | POA: Diagnosis not present

## 2023-01-13 DIAGNOSIS — R29720 NIHSS score 20: Secondary | ICD-10-CM | POA: Diagnosis not present

## 2023-01-13 DIAGNOSIS — M4696 Unspecified inflammatory spondylopathy, lumbar region: Secondary | ICD-10-CM | POA: Diagnosis not present

## 2023-01-13 DIAGNOSIS — G238 Other specified degenerative diseases of basal ganglia: Secondary | ICD-10-CM | POA: Diagnosis not present

## 2023-01-13 DIAGNOSIS — I5032 Chronic diastolic (congestive) heart failure: Secondary | ICD-10-CM | POA: Diagnosis not present

## 2023-01-13 DIAGNOSIS — R9431 Abnormal electrocardiogram [ECG] [EKG]: Secondary | ICD-10-CM | POA: Diagnosis not present

## 2023-01-13 DIAGNOSIS — N390 Urinary tract infection, site not specified: Secondary | ICD-10-CM | POA: Diagnosis not present

## 2023-01-13 DIAGNOSIS — I6350 Cerebral infarction due to unspecified occlusion or stenosis of unspecified cerebral artery: Secondary | ICD-10-CM | POA: Diagnosis not present

## 2023-01-13 DIAGNOSIS — R296 Repeated falls: Secondary | ICD-10-CM | POA: Diagnosis not present

## 2023-01-13 DIAGNOSIS — J42 Unspecified chronic bronchitis: Secondary | ICD-10-CM | POA: Diagnosis not present

## 2023-01-13 DIAGNOSIS — R279 Unspecified lack of coordination: Secondary | ICD-10-CM | POA: Diagnosis not present

## 2023-01-13 DIAGNOSIS — R55 Syncope and collapse: Secondary | ICD-10-CM | POA: Diagnosis not present

## 2023-01-13 DIAGNOSIS — B9620 Unspecified Escherichia coli [E. coli] as the cause of diseases classified elsewhere: Secondary | ICD-10-CM | POA: Diagnosis not present

## 2023-01-13 DIAGNOSIS — R41841 Cognitive communication deficit: Secondary | ICD-10-CM | POA: Diagnosis not present

## 2023-01-13 DIAGNOSIS — Z8673 Personal history of transient ischemic attack (TIA), and cerebral infarction without residual deficits: Secondary | ICD-10-CM | POA: Diagnosis not present

## 2023-01-13 DIAGNOSIS — N3001 Acute cystitis with hematuria: Secondary | ICD-10-CM | POA: Diagnosis not present

## 2023-01-13 DIAGNOSIS — E119 Type 2 diabetes mellitus without complications: Secondary | ICD-10-CM | POA: Diagnosis not present

## 2023-01-13 DIAGNOSIS — Z794 Long term (current) use of insulin: Secondary | ICD-10-CM | POA: Diagnosis not present

## 2023-01-13 DIAGNOSIS — F419 Anxiety disorder, unspecified: Secondary | ICD-10-CM | POA: Diagnosis not present

## 2023-01-13 DIAGNOSIS — R4701 Aphasia: Secondary | ICD-10-CM | POA: Diagnosis not present

## 2023-01-13 DIAGNOSIS — I491 Atrial premature depolarization: Secondary | ICD-10-CM | POA: Diagnosis not present

## 2023-01-13 DIAGNOSIS — R0989 Other specified symptoms and signs involving the circulatory and respiratory systems: Secondary | ICD-10-CM | POA: Diagnosis not present

## 2023-01-13 DIAGNOSIS — S2249XD Multiple fractures of ribs, unspecified side, subsequent encounter for fracture with routine healing: Secondary | ICD-10-CM | POA: Diagnosis not present

## 2023-01-13 DIAGNOSIS — I6782 Cerebral ischemia: Secondary | ICD-10-CM | POA: Diagnosis not present

## 2023-01-13 DIAGNOSIS — I672 Cerebral atherosclerosis: Secondary | ICD-10-CM | POA: Diagnosis not present

## 2023-01-13 DIAGNOSIS — I279 Pulmonary heart disease, unspecified: Secondary | ICD-10-CM | POA: Diagnosis not present

## 2023-01-13 DIAGNOSIS — I444 Left anterior fascicular block: Secondary | ICD-10-CM | POA: Diagnosis not present

## 2023-01-13 DIAGNOSIS — I663 Occlusion and stenosis of cerebellar arteries: Secondary | ICD-10-CM | POA: Diagnosis not present

## 2023-01-13 DIAGNOSIS — I6501 Occlusion and stenosis of right vertebral artery: Secondary | ICD-10-CM | POA: Diagnosis not present

## 2023-01-15 ENCOUNTER — Ambulatory Visit (INDEPENDENT_AMBULATORY_CARE_PROVIDER_SITE_OTHER): Payer: Medicare Other

## 2023-01-15 DIAGNOSIS — R55 Syncope and collapse: Secondary | ICD-10-CM

## 2023-01-15 LAB — CUP PACEART REMOTE DEVICE CHECK
Date Time Interrogation Session: 20240907230912
Implantable Pulse Generator Implant Date: 20240703

## 2023-01-16 DIAGNOSIS — I6389 Other cerebral infarction: Secondary | ICD-10-CM | POA: Diagnosis not present

## 2023-01-18 DIAGNOSIS — L89312 Pressure ulcer of right buttock, stage 2: Secondary | ICD-10-CM | POA: Diagnosis not present

## 2023-01-18 DIAGNOSIS — S2249XD Multiple fractures of ribs, unspecified side, subsequent encounter for fracture with routine healing: Secondary | ICD-10-CM | POA: Diagnosis not present

## 2023-01-18 DIAGNOSIS — Z7401 Bed confinement status: Secondary | ICD-10-CM | POA: Diagnosis not present

## 2023-01-18 DIAGNOSIS — I700 Atherosclerosis of aorta: Secondary | ICD-10-CM | POA: Diagnosis not present

## 2023-01-18 DIAGNOSIS — Z794 Long term (current) use of insulin: Secondary | ICD-10-CM | POA: Diagnosis not present

## 2023-01-18 DIAGNOSIS — I6350 Cerebral infarction due to unspecified occlusion or stenosis of unspecified cerebral artery: Secondary | ICD-10-CM | POA: Diagnosis not present

## 2023-01-18 DIAGNOSIS — R41841 Cognitive communication deficit: Secondary | ICD-10-CM | POA: Diagnosis not present

## 2023-01-18 DIAGNOSIS — E1142 Type 2 diabetes mellitus with diabetic polyneuropathy: Secondary | ICD-10-CM | POA: Diagnosis present

## 2023-01-18 DIAGNOSIS — R32 Unspecified urinary incontinence: Secondary | ICD-10-CM | POA: Diagnosis not present

## 2023-01-18 DIAGNOSIS — R55 Syncope and collapse: Secondary | ICD-10-CM | POA: Diagnosis not present

## 2023-01-18 DIAGNOSIS — E785 Hyperlipidemia, unspecified: Secondary | ICD-10-CM | POA: Diagnosis not present

## 2023-01-18 DIAGNOSIS — Z9181 History of falling: Secondary | ICD-10-CM | POA: Diagnosis not present

## 2023-01-18 DIAGNOSIS — R935 Abnormal findings on diagnostic imaging of other abdominal regions, including retroperitoneum: Secondary | ICD-10-CM | POA: Diagnosis not present

## 2023-01-18 DIAGNOSIS — E1122 Type 2 diabetes mellitus with diabetic chronic kidney disease: Secondary | ICD-10-CM | POA: Diagnosis present

## 2023-01-18 DIAGNOSIS — E119 Type 2 diabetes mellitus without complications: Secondary | ICD-10-CM | POA: Diagnosis not present

## 2023-01-18 DIAGNOSIS — Z8673 Personal history of transient ischemic attack (TIA), and cerebral infarction without residual deficits: Secondary | ICD-10-CM | POA: Diagnosis not present

## 2023-01-18 DIAGNOSIS — L89322 Pressure ulcer of left buttock, stage 2: Secondary | ICD-10-CM | POA: Diagnosis not present

## 2023-01-18 DIAGNOSIS — R4182 Altered mental status, unspecified: Secondary | ICD-10-CM | POA: Diagnosis not present

## 2023-01-18 DIAGNOSIS — A419 Sepsis, unspecified organism: Secondary | ICD-10-CM | POA: Diagnosis present

## 2023-01-18 DIAGNOSIS — Z87440 Personal history of urinary (tract) infections: Secondary | ICD-10-CM | POA: Diagnosis not present

## 2023-01-18 DIAGNOSIS — E1165 Type 2 diabetes mellitus with hyperglycemia: Secondary | ICD-10-CM | POA: Diagnosis not present

## 2023-01-18 DIAGNOSIS — R279 Unspecified lack of coordination: Secondary | ICD-10-CM | POA: Diagnosis not present

## 2023-01-18 DIAGNOSIS — R000 Tachycardia, unspecified: Secondary | ICD-10-CM | POA: Diagnosis not present

## 2023-01-18 DIAGNOSIS — J929 Pleural plaque without asbestos: Secondary | ICD-10-CM | POA: Diagnosis not present

## 2023-01-18 DIAGNOSIS — R338 Other retention of urine: Secondary | ICD-10-CM | POA: Diagnosis not present

## 2023-01-18 DIAGNOSIS — N1831 Chronic kidney disease, stage 3a: Secondary | ICD-10-CM | POA: Diagnosis present

## 2023-01-18 DIAGNOSIS — Z1152 Encounter for screening for COVID-19: Secondary | ICD-10-CM | POA: Diagnosis not present

## 2023-01-18 DIAGNOSIS — I959 Hypotension, unspecified: Secondary | ICD-10-CM | POA: Diagnosis not present

## 2023-01-18 DIAGNOSIS — I110 Hypertensive heart disease with heart failure: Secondary | ICD-10-CM | POA: Diagnosis not present

## 2023-01-18 DIAGNOSIS — L89319 Pressure ulcer of right buttock, unspecified stage: Secondary | ICD-10-CM | POA: Diagnosis present

## 2023-01-18 DIAGNOSIS — R509 Fever, unspecified: Secondary | ICD-10-CM | POA: Diagnosis not present

## 2023-01-18 DIAGNOSIS — M6281 Muscle weakness (generalized): Secondary | ICD-10-CM | POA: Diagnosis not present

## 2023-01-18 DIAGNOSIS — J449 Chronic obstructive pulmonary disease, unspecified: Secondary | ICD-10-CM | POA: Diagnosis not present

## 2023-01-18 DIAGNOSIS — Z7902 Long term (current) use of antithrombotics/antiplatelets: Secondary | ICD-10-CM | POA: Diagnosis not present

## 2023-01-18 DIAGNOSIS — R1312 Dysphagia, oropharyngeal phase: Secondary | ICD-10-CM | POA: Diagnosis not present

## 2023-01-18 DIAGNOSIS — R7881 Bacteremia: Secondary | ICD-10-CM | POA: Diagnosis not present

## 2023-01-18 DIAGNOSIS — I6523 Occlusion and stenosis of bilateral carotid arteries: Secondary | ICD-10-CM | POA: Diagnosis not present

## 2023-01-18 DIAGNOSIS — R1310 Dysphagia, unspecified: Secondary | ICD-10-CM | POA: Diagnosis not present

## 2023-01-18 DIAGNOSIS — R2681 Unsteadiness on feet: Secondary | ICD-10-CM | POA: Diagnosis not present

## 2023-01-18 DIAGNOSIS — I454 Nonspecific intraventricular block: Secondary | ICD-10-CM | POA: Diagnosis not present

## 2023-01-18 DIAGNOSIS — I5032 Chronic diastolic (congestive) heart failure: Secondary | ICD-10-CM | POA: Diagnosis present

## 2023-01-18 DIAGNOSIS — I130 Hypertensive heart and chronic kidney disease with heart failure and stage 1 through stage 4 chronic kidney disease, or unspecified chronic kidney disease: Secondary | ICD-10-CM | POA: Diagnosis present

## 2023-01-18 DIAGNOSIS — K5900 Constipation, unspecified: Secondary | ICD-10-CM | POA: Diagnosis not present

## 2023-01-18 DIAGNOSIS — Z1612 Extended spectrum beta lactamase (ESBL) resistance: Secondary | ICD-10-CM | POA: Diagnosis present

## 2023-01-18 DIAGNOSIS — S36039A Unspecified laceration of spleen, initial encounter: Secondary | ICD-10-CM | POA: Diagnosis present

## 2023-01-18 DIAGNOSIS — Z741 Need for assistance with personal care: Secondary | ICD-10-CM | POA: Diagnosis not present

## 2023-01-18 DIAGNOSIS — I10 Essential (primary) hypertension: Secondary | ICD-10-CM | POA: Diagnosis not present

## 2023-01-18 DIAGNOSIS — I679 Cerebrovascular disease, unspecified: Secondary | ICD-10-CM | POA: Diagnosis not present

## 2023-01-18 DIAGNOSIS — Z8619 Personal history of other infectious and parasitic diseases: Secondary | ICD-10-CM | POA: Diagnosis not present

## 2023-01-18 DIAGNOSIS — W19XXXA Unspecified fall, initial encounter: Secondary | ICD-10-CM | POA: Diagnosis present

## 2023-01-18 DIAGNOSIS — Z66 Do not resuscitate: Secondary | ICD-10-CM | POA: Diagnosis present

## 2023-01-18 DIAGNOSIS — I2510 Atherosclerotic heart disease of native coronary artery without angina pectoris: Secondary | ICD-10-CM | POA: Diagnosis not present

## 2023-01-18 DIAGNOSIS — D709 Neutropenia, unspecified: Secondary | ICD-10-CM | POA: Diagnosis present

## 2023-01-18 DIAGNOSIS — J42 Unspecified chronic bronchitis: Secondary | ICD-10-CM | POA: Diagnosis not present

## 2023-01-18 DIAGNOSIS — R404 Transient alteration of awareness: Secondary | ICD-10-CM | POA: Diagnosis not present

## 2023-01-18 DIAGNOSIS — N179 Acute kidney failure, unspecified: Secondary | ICD-10-CM | POA: Diagnosis present

## 2023-01-18 DIAGNOSIS — M4696 Unspecified inflammatory spondylopathy, lumbar region: Secondary | ICD-10-CM | POA: Diagnosis not present

## 2023-01-18 DIAGNOSIS — R262 Difficulty in walking, not elsewhere classified: Secondary | ICD-10-CM | POA: Diagnosis not present

## 2023-01-18 DIAGNOSIS — Z7982 Long term (current) use of aspirin: Secondary | ICD-10-CM | POA: Diagnosis not present

## 2023-01-18 DIAGNOSIS — R6521 Severe sepsis with septic shock: Secondary | ICD-10-CM | POA: Diagnosis present

## 2023-01-18 DIAGNOSIS — F4321 Adjustment disorder with depressed mood: Secondary | ICD-10-CM | POA: Diagnosis not present

## 2023-01-18 DIAGNOSIS — G238 Other specified degenerative diseases of basal ganglia: Secondary | ICD-10-CM | POA: Diagnosis not present

## 2023-01-18 DIAGNOSIS — G9341 Metabolic encephalopathy: Secondary | ICD-10-CM | POA: Diagnosis present

## 2023-01-18 DIAGNOSIS — G2581 Restless legs syndrome: Secondary | ICD-10-CM | POA: Diagnosis present

## 2023-01-18 DIAGNOSIS — I69820 Aphasia following other cerebrovascular disease: Secondary | ICD-10-CM | POA: Diagnosis not present

## 2023-01-18 DIAGNOSIS — K5790 Diverticulosis of intestine, part unspecified, without perforation or abscess without bleeding: Secondary | ICD-10-CM | POA: Diagnosis not present

## 2023-01-18 DIAGNOSIS — N12 Tubulo-interstitial nephritis, not specified as acute or chronic: Secondary | ICD-10-CM | POA: Diagnosis not present

## 2023-01-18 DIAGNOSIS — D61818 Other pancytopenia: Secondary | ICD-10-CM | POA: Diagnosis present

## 2023-01-18 DIAGNOSIS — I672 Cerebral atherosclerosis: Secondary | ICD-10-CM | POA: Diagnosis not present

## 2023-01-18 DIAGNOSIS — F0390 Unspecified dementia without behavioral disturbance: Secondary | ICD-10-CM | POA: Diagnosis present

## 2023-01-18 DIAGNOSIS — R296 Repeated falls: Secondary | ICD-10-CM | POA: Diagnosis not present

## 2023-01-18 DIAGNOSIS — N3001 Acute cystitis with hematuria: Secondary | ICD-10-CM | POA: Diagnosis not present

## 2023-01-18 DIAGNOSIS — E872 Acidosis, unspecified: Secondary | ICD-10-CM | POA: Diagnosis present

## 2023-01-18 DIAGNOSIS — N10 Acute tubulo-interstitial nephritis: Secondary | ICD-10-CM | POA: Diagnosis present

## 2023-01-18 DIAGNOSIS — S2241XA Multiple fractures of ribs, right side, initial encounter for closed fracture: Secondary | ICD-10-CM | POA: Diagnosis present

## 2023-01-19 DIAGNOSIS — Z9181 History of falling: Secondary | ICD-10-CM | POA: Diagnosis not present

## 2023-01-19 DIAGNOSIS — I679 Cerebrovascular disease, unspecified: Secondary | ICD-10-CM | POA: Diagnosis not present

## 2023-01-19 DIAGNOSIS — M6281 Muscle weakness (generalized): Secondary | ICD-10-CM | POA: Diagnosis not present

## 2023-01-19 DIAGNOSIS — G9341 Metabolic encephalopathy: Secondary | ICD-10-CM | POA: Diagnosis not present

## 2023-01-19 DIAGNOSIS — R2681 Unsteadiness on feet: Secondary | ICD-10-CM | POA: Diagnosis not present

## 2023-01-19 DIAGNOSIS — I69820 Aphasia following other cerebrovascular disease: Secondary | ICD-10-CM | POA: Diagnosis not present

## 2023-01-22 ENCOUNTER — Ambulatory Visit: Payer: Self-pay

## 2023-01-22 DIAGNOSIS — E1142 Type 2 diabetes mellitus with diabetic polyneuropathy: Secondary | ICD-10-CM | POA: Diagnosis not present

## 2023-01-22 DIAGNOSIS — I2510 Atherosclerotic heart disease of native coronary artery without angina pectoris: Secondary | ICD-10-CM | POA: Diagnosis not present

## 2023-01-22 DIAGNOSIS — Z8619 Personal history of other infectious and parasitic diseases: Secondary | ICD-10-CM | POA: Diagnosis not present

## 2023-01-22 DIAGNOSIS — M6281 Muscle weakness (generalized): Secondary | ICD-10-CM | POA: Diagnosis not present

## 2023-01-22 DIAGNOSIS — G2581 Restless legs syndrome: Secondary | ICD-10-CM | POA: Diagnosis not present

## 2023-01-22 DIAGNOSIS — Z87440 Personal history of urinary (tract) infections: Secondary | ICD-10-CM | POA: Diagnosis not present

## 2023-01-22 DIAGNOSIS — Z9181 History of falling: Secondary | ICD-10-CM | POA: Diagnosis not present

## 2023-01-22 DIAGNOSIS — Z7902 Long term (current) use of antithrombotics/antiplatelets: Secondary | ICD-10-CM | POA: Diagnosis not present

## 2023-01-22 DIAGNOSIS — R32 Unspecified urinary incontinence: Secondary | ICD-10-CM | POA: Diagnosis not present

## 2023-01-22 DIAGNOSIS — I679 Cerebrovascular disease, unspecified: Secondary | ICD-10-CM | POA: Diagnosis not present

## 2023-01-22 DIAGNOSIS — I110 Hypertensive heart disease with heart failure: Secondary | ICD-10-CM | POA: Diagnosis not present

## 2023-01-22 DIAGNOSIS — E1165 Type 2 diabetes mellitus with hyperglycemia: Secondary | ICD-10-CM | POA: Diagnosis not present

## 2023-01-22 DIAGNOSIS — G9341 Metabolic encephalopathy: Secondary | ICD-10-CM | POA: Diagnosis not present

## 2023-01-22 DIAGNOSIS — R2681 Unsteadiness on feet: Secondary | ICD-10-CM | POA: Diagnosis not present

## 2023-01-22 DIAGNOSIS — I69820 Aphasia following other cerebrovascular disease: Secondary | ICD-10-CM | POA: Diagnosis not present

## 2023-01-22 DIAGNOSIS — I5032 Chronic diastolic (congestive) heart failure: Secondary | ICD-10-CM | POA: Diagnosis not present

## 2023-01-22 DIAGNOSIS — Z8673 Personal history of transient ischemic attack (TIA), and cerebral infarction without residual deficits: Secondary | ICD-10-CM | POA: Diagnosis not present

## 2023-01-22 DIAGNOSIS — Z794 Long term (current) use of insulin: Secondary | ICD-10-CM | POA: Diagnosis not present

## 2023-01-22 DIAGNOSIS — Z7982 Long term (current) use of aspirin: Secondary | ICD-10-CM | POA: Diagnosis not present

## 2023-01-22 DIAGNOSIS — I6523 Occlusion and stenosis of bilateral carotid arteries: Secondary | ICD-10-CM | POA: Diagnosis not present

## 2023-01-22 DIAGNOSIS — E785 Hyperlipidemia, unspecified: Secondary | ICD-10-CM | POA: Diagnosis not present

## 2023-01-22 NOTE — Patient Outreach (Signed)
Care Coordination   01/22/2023 Name: BENELLI SCHUELKE MRN: [id number] DOB: May 09, 1945   Care Coordination Outreach Attempts:  An unsuccessful telephone outreach was attempted for a scheduled appointment today.  Follow Up Plan:  Additional outreach attempts will be made to offer the patient care coordination information and services.   Encounter Outcome:  No Answer   Care Coordination Interventions:  No, not indicated    Lonia Chimera, RN, BSN, CEN [facility] [employer] [phone]

## 2023-01-23 DIAGNOSIS — L89312 Pressure ulcer of right buttock, stage 2: Secondary | ICD-10-CM | POA: Diagnosis not present

## 2023-01-23 DIAGNOSIS — F4321 Adjustment disorder with depressed mood: Secondary | ICD-10-CM | POA: Diagnosis not present

## 2023-01-23 DIAGNOSIS — L89322 Pressure ulcer of left buttock, stage 2: Secondary | ICD-10-CM | POA: Diagnosis not present

## 2023-01-24 DIAGNOSIS — M6281 Muscle weakness (generalized): Secondary | ICD-10-CM | POA: Diagnosis not present

## 2023-01-24 DIAGNOSIS — R2681 Unsteadiness on feet: Secondary | ICD-10-CM | POA: Diagnosis not present

## 2023-01-24 DIAGNOSIS — I69820 Aphasia following other cerebrovascular disease: Secondary | ICD-10-CM | POA: Diagnosis not present

## 2023-01-24 DIAGNOSIS — I679 Cerebrovascular disease, unspecified: Secondary | ICD-10-CM | POA: Diagnosis not present

## 2023-01-24 DIAGNOSIS — Z9181 History of falling: Secondary | ICD-10-CM | POA: Diagnosis not present

## 2023-01-24 DIAGNOSIS — G9341 Metabolic encephalopathy: Secondary | ICD-10-CM | POA: Diagnosis not present

## 2023-01-25 ENCOUNTER — Other Ambulatory Visit: Payer: Self-pay | Admitting: *Deleted

## 2023-01-25 NOTE — Patient Outreach (Signed)
[name] resides in  [facility] skilled nursing facility.  [name] recently discharged from [facility] and was referred to chronic care management team. However, [name] readmitted before engagement.   Collaboration with Mining engineer and social work team. Sales promotion account executive suggests [name] remain long term care due to her frequent hospitalizations.   Will continue to follow while [name] remains in SNF.   Will update care coordination/chronic care management team.   Raiford Noble, MSN, RN, BSN [place]  [facility] RN Post- Acute Care Coordinator Direct Dial: [phone]

## 2023-01-26 DIAGNOSIS — Z8673 Personal history of transient ischemic attack (TIA), and cerebral infarction without residual deficits: Secondary | ICD-10-CM | POA: Diagnosis not present

## 2023-01-26 DIAGNOSIS — I5032 Chronic diastolic (congestive) heart failure: Secondary | ICD-10-CM | POA: Diagnosis not present

## 2023-01-26 DIAGNOSIS — Z7902 Long term (current) use of antithrombotics/antiplatelets: Secondary | ICD-10-CM | POA: Diagnosis not present

## 2023-01-26 DIAGNOSIS — I10 Essential (primary) hypertension: Secondary | ICD-10-CM | POA: Diagnosis not present

## 2023-01-26 DIAGNOSIS — E1165 Type 2 diabetes mellitus with hyperglycemia: Secondary | ICD-10-CM | POA: Diagnosis not present

## 2023-01-26 DIAGNOSIS — Z794 Long term (current) use of insulin: Secondary | ICD-10-CM | POA: Diagnosis not present

## 2023-01-26 DIAGNOSIS — E785 Hyperlipidemia, unspecified: Secondary | ICD-10-CM | POA: Diagnosis not present

## 2023-01-28 ENCOUNTER — Emergency Department (HOSPITAL_COMMUNITY): Payer: Medicare Other

## 2023-01-28 ENCOUNTER — Inpatient Hospital Stay (HOSPITAL_COMMUNITY)
Admission: EM | Admit: 2023-01-28 | Discharge: 2023-02-07 | DRG: 871 | Disposition: A | Discharge status: Skilled Nursing Facility | Payer: Medicare Other | Source: Skilled Nursing Facility | Attending: Internal Medicine | Admitting: Internal Medicine

## 2023-01-28 ENCOUNTER — Encounter (HOSPITAL_COMMUNITY): Payer: Self-pay

## 2023-01-28 DIAGNOSIS — I6350 Cerebral infarction due to unspecified occlusion or stenosis of unspecified cerebral artery: Secondary | ICD-10-CM | POA: Diagnosis not present

## 2023-01-28 DIAGNOSIS — Z66 Do not resuscitate: Secondary | ICD-10-CM | POA: Diagnosis present

## 2023-01-28 DIAGNOSIS — N10 Acute tubulo-interstitial nephritis: Secondary | ICD-10-CM | POA: Diagnosis present

## 2023-01-28 DIAGNOSIS — J449 Chronic obstructive pulmonary disease, unspecified: Secondary | ICD-10-CM | POA: Diagnosis present

## 2023-01-28 DIAGNOSIS — Z95818 Presence of other cardiac implants and grafts: Secondary | ICD-10-CM

## 2023-01-28 DIAGNOSIS — D61818 Other pancytopenia: Secondary | ICD-10-CM | POA: Diagnosis present

## 2023-01-28 DIAGNOSIS — S36020A Minor contusion of spleen, initial encounter: Secondary | ICD-10-CM | POA: Diagnosis not present

## 2023-01-28 DIAGNOSIS — N1831 Chronic kidney disease, stage 3a: Secondary | ICD-10-CM

## 2023-01-28 DIAGNOSIS — I130 Hypertensive heart and chronic kidney disease with heart failure and stage 1 through stage 4 chronic kidney disease, or unspecified chronic kidney disease: Secondary | ICD-10-CM | POA: Diagnosis present

## 2023-01-28 DIAGNOSIS — L8990 Pressure ulcer of unspecified site, unspecified stage: Secondary | ICD-10-CM | POA: Insufficient documentation

## 2023-01-28 DIAGNOSIS — G2581 Restless legs syndrome: Secondary | ICD-10-CM | POA: Diagnosis not present

## 2023-01-28 DIAGNOSIS — J42 Unspecified chronic bronchitis: Secondary | ICD-10-CM | POA: Diagnosis not present

## 2023-01-28 DIAGNOSIS — R2681 Unsteadiness on feet: Secondary | ICD-10-CM | POA: Diagnosis not present

## 2023-01-28 DIAGNOSIS — I5032 Chronic diastolic (congestive) heart failure: Secondary | ICD-10-CM | POA: Diagnosis not present

## 2023-01-28 DIAGNOSIS — I700 Atherosclerosis of aorta: Secondary | ICD-10-CM | POA: Diagnosis not present

## 2023-01-28 DIAGNOSIS — M6281 Muscle weakness (generalized): Secondary | ICD-10-CM | POA: Diagnosis not present

## 2023-01-28 DIAGNOSIS — Z811 Family history of alcohol abuse and dependence: Secondary | ICD-10-CM

## 2023-01-28 DIAGNOSIS — J929 Pleural plaque without asbestos: Secondary | ICD-10-CM | POA: Diagnosis not present

## 2023-01-28 DIAGNOSIS — R4182 Altered mental status, unspecified: Secondary | ICD-10-CM | POA: Diagnosis not present

## 2023-01-28 DIAGNOSIS — G238 Other specified degenerative diseases of basal ganglia: Secondary | ICD-10-CM | POA: Diagnosis not present

## 2023-01-28 DIAGNOSIS — E876 Hypokalemia: Secondary | ICD-10-CM | POA: Insufficient documentation

## 2023-01-28 DIAGNOSIS — E872 Acidosis, unspecified: Secondary | ICD-10-CM | POA: Diagnosis not present

## 2023-01-28 DIAGNOSIS — N12 Tubulo-interstitial nephritis, not specified as acute or chronic: Secondary | ICD-10-CM | POA: Diagnosis not present

## 2023-01-28 DIAGNOSIS — Z1152 Encounter for screening for COVID-19: Secondary | ICD-10-CM

## 2023-01-28 DIAGNOSIS — E119 Type 2 diabetes mellitus without complications: Secondary | ICD-10-CM | POA: Diagnosis not present

## 2023-01-28 DIAGNOSIS — K5900 Constipation, unspecified: Secondary | ICD-10-CM | POA: Insufficient documentation

## 2023-01-28 DIAGNOSIS — S2249XD Multiple fractures of ribs, unspecified side, subsequent encounter for fracture with routine healing: Secondary | ICD-10-CM | POA: Diagnosis not present

## 2023-01-28 DIAGNOSIS — N179 Acute kidney failure, unspecified: Secondary | ICD-10-CM | POA: Diagnosis present

## 2023-01-28 DIAGNOSIS — Z7902 Long term (current) use of antithrombotics/antiplatelets: Secondary | ICD-10-CM

## 2023-01-28 DIAGNOSIS — Z7401 Bed confinement status: Secondary | ICD-10-CM | POA: Diagnosis not present

## 2023-01-28 DIAGNOSIS — W19XXXA Unspecified fall, initial encounter: Secondary | ICD-10-CM | POA: Diagnosis present

## 2023-01-28 DIAGNOSIS — M4696 Unspecified inflammatory spondylopathy, lumbar region: Secondary | ICD-10-CM | POA: Diagnosis not present

## 2023-01-28 DIAGNOSIS — I959 Hypotension, unspecified: Secondary | ICD-10-CM | POA: Diagnosis not present

## 2023-01-28 DIAGNOSIS — Z7189 Other specified counseling: Secondary | ICD-10-CM

## 2023-01-28 DIAGNOSIS — S36039A Unspecified laceration of spleen, initial encounter: Secondary | ICD-10-CM | POA: Diagnosis not present

## 2023-01-28 DIAGNOSIS — L89319 Pressure ulcer of right buttock, unspecified stage: Secondary | ICD-10-CM | POA: Diagnosis present

## 2023-01-28 DIAGNOSIS — B965 Pseudomonas (aeruginosa) (mallei) (pseudomallei) as the cause of diseases classified elsewhere: Secondary | ICD-10-CM | POA: Diagnosis present

## 2023-01-28 DIAGNOSIS — R296 Repeated falls: Secondary | ICD-10-CM | POA: Diagnosis not present

## 2023-01-28 DIAGNOSIS — R4189 Other symptoms and signs involving cognitive functions and awareness: Secondary | ICD-10-CM | POA: Diagnosis not present

## 2023-01-28 DIAGNOSIS — Z8249 Family history of ischemic heart disease and other diseases of the circulatory system: Secondary | ICD-10-CM

## 2023-01-28 DIAGNOSIS — E1122 Type 2 diabetes mellitus with diabetic chronic kidney disease: Secondary | ICD-10-CM | POA: Diagnosis present

## 2023-01-28 DIAGNOSIS — R1312 Dysphagia, oropharyngeal phase: Secondary | ICD-10-CM | POA: Diagnosis not present

## 2023-01-28 DIAGNOSIS — M810 Age-related osteoporosis without current pathological fracture: Secondary | ICD-10-CM | POA: Diagnosis present

## 2023-01-28 DIAGNOSIS — Z7982 Long term (current) use of aspirin: Secondary | ICD-10-CM

## 2023-01-28 DIAGNOSIS — E1142 Type 2 diabetes mellitus with diabetic polyneuropathy: Secondary | ICD-10-CM | POA: Diagnosis not present

## 2023-01-28 DIAGNOSIS — R41841 Cognitive communication deficit: Secondary | ICD-10-CM | POA: Diagnosis not present

## 2023-01-28 DIAGNOSIS — R339 Retention of urine, unspecified: Secondary | ICD-10-CM | POA: Diagnosis not present

## 2023-01-28 DIAGNOSIS — R000 Tachycardia, unspecified: Secondary | ICD-10-CM

## 2023-01-28 DIAGNOSIS — R2689 Other abnormalities of gait and mobility: Secondary | ICD-10-CM | POA: Diagnosis not present

## 2023-01-28 DIAGNOSIS — Z741 Need for assistance with personal care: Secondary | ICD-10-CM | POA: Diagnosis not present

## 2023-01-28 DIAGNOSIS — S2241XA Multiple fractures of ribs, right side, initial encounter for closed fracture: Secondary | ICD-10-CM | POA: Diagnosis present

## 2023-01-28 DIAGNOSIS — Z1612 Extended spectrum beta lactamase (ESBL) resistance: Secondary | ICD-10-CM | POA: Diagnosis present

## 2023-01-28 DIAGNOSIS — E785 Hyperlipidemia, unspecified: Secondary | ICD-10-CM | POA: Diagnosis present

## 2023-01-28 DIAGNOSIS — I454 Nonspecific intraventricular block: Secondary | ICD-10-CM | POA: Diagnosis not present

## 2023-01-28 DIAGNOSIS — R509 Fever, unspecified: Secondary | ICD-10-CM | POA: Diagnosis not present

## 2023-01-28 DIAGNOSIS — R7881 Bacteremia: Secondary | ICD-10-CM | POA: Diagnosis not present

## 2023-01-28 DIAGNOSIS — Z8673 Personal history of transient ischemic attack (TIA), and cerebral infarction without residual deficits: Secondary | ICD-10-CM

## 2023-01-28 DIAGNOSIS — R338 Other retention of urine: Secondary | ICD-10-CM | POA: Diagnosis not present

## 2023-01-28 DIAGNOSIS — R55 Syncope and collapse: Secondary | ICD-10-CM | POA: Diagnosis not present

## 2023-01-28 DIAGNOSIS — D709 Neutropenia, unspecified: Secondary | ICD-10-CM | POA: Diagnosis present

## 2023-01-28 DIAGNOSIS — Z993 Dependence on wheelchair: Secondary | ICD-10-CM

## 2023-01-28 DIAGNOSIS — A419 Sepsis, unspecified organism: Principal | ICD-10-CM | POA: Diagnosis present

## 2023-01-28 DIAGNOSIS — Z79899 Other long term (current) drug therapy: Secondary | ICD-10-CM

## 2023-01-28 DIAGNOSIS — R6521 Severe sepsis with septic shock: Secondary | ICD-10-CM | POA: Diagnosis not present

## 2023-01-28 DIAGNOSIS — R531 Weakness: Secondary | ICD-10-CM | POA: Diagnosis not present

## 2023-01-28 DIAGNOSIS — Z794 Long term (current) use of insulin: Secondary | ICD-10-CM | POA: Diagnosis not present

## 2023-01-28 DIAGNOSIS — R404 Transient alteration of awareness: Secondary | ICD-10-CM | POA: Diagnosis not present

## 2023-01-28 DIAGNOSIS — K5790 Diverticulosis of intestine, part unspecified, without perforation or abscess without bleeding: Secondary | ICD-10-CM | POA: Diagnosis not present

## 2023-01-28 DIAGNOSIS — N3001 Acute cystitis with hematuria: Secondary | ICD-10-CM | POA: Diagnosis not present

## 2023-01-28 DIAGNOSIS — Z888 Allergy status to other drugs, medicaments and biological substances status: Secondary | ICD-10-CM

## 2023-01-28 DIAGNOSIS — L89329 Pressure ulcer of left buttock, unspecified stage: Secondary | ICD-10-CM | POA: Diagnosis present

## 2023-01-28 DIAGNOSIS — R1310 Dysphagia, unspecified: Secondary | ICD-10-CM | POA: Diagnosis not present

## 2023-01-28 DIAGNOSIS — I10 Essential (primary) hypertension: Secondary | ICD-10-CM | POA: Diagnosis present

## 2023-01-28 DIAGNOSIS — S2249XA Multiple fractures of ribs, unspecified side, initial encounter for closed fracture: Secondary | ICD-10-CM | POA: Diagnosis present

## 2023-01-28 DIAGNOSIS — I672 Cerebral atherosclerosis: Secondary | ICD-10-CM | POA: Diagnosis not present

## 2023-01-28 DIAGNOSIS — F0390 Unspecified dementia without behavioral disturbance: Secondary | ICD-10-CM | POA: Diagnosis present

## 2023-01-28 DIAGNOSIS — G9341 Metabolic encephalopathy: Secondary | ICD-10-CM | POA: Diagnosis present

## 2023-01-28 DIAGNOSIS — Z833 Family history of diabetes mellitus: Secondary | ICD-10-CM

## 2023-01-28 DIAGNOSIS — Z823 Family history of stroke: Secondary | ICD-10-CM

## 2023-01-28 DIAGNOSIS — J99 Respiratory disorders in diseases classified elsewhere: Secondary | ICD-10-CM | POA: Diagnosis not present

## 2023-01-28 DIAGNOSIS — R935 Abnormal findings on diagnostic imaging of other abdominal regions, including retroperitoneum: Secondary | ICD-10-CM | POA: Diagnosis not present

## 2023-01-28 DIAGNOSIS — R262 Difficulty in walking, not elsewhere classified: Secondary | ICD-10-CM | POA: Diagnosis not present

## 2023-01-28 DIAGNOSIS — B9620 Unspecified Escherichia coli [E. coli] as the cause of diseases classified elsewhere: Secondary | ICD-10-CM | POA: Diagnosis present

## 2023-01-28 LAB — CBC WITH DIFFERENTIAL/PLATELET
Abs Immature Granulocytes: 0.07 10*3/uL (ref 0.00–0.07)
Basophils Absolute: 0.1 10*3/uL (ref 0.0–0.1)
Basophils Relative: 0 %
Eosinophils Absolute: 0 10*3/uL (ref 0.0–0.5)
Eosinophils Relative: 0 %
HCT: 36 % (ref 36.0–46.0)
Hemoglobin: 11.7 g/dL — ABNORMAL LOW (ref 12.0–15.0)
Immature Granulocytes: 0 %
Lymphocytes Relative: 9 %
Lymphs Abs: 1.4 10*3/uL (ref 0.7–4.0)
MCH: 29 pg (ref 26.0–34.0)
MCHC: 32.5 g/dL (ref 30.0–36.0)
MCV: 89.1 fL (ref 80.0–100.0)
Monocytes Absolute: 1.7 10*3/uL — ABNORMAL HIGH (ref 0.1–1.0)
Monocytes Relative: 11 %
Neutro Abs: 12.5 10*3/uL — ABNORMAL HIGH (ref 1.7–7.7)
Neutrophils Relative %: 80 %
Platelets: 213 10*3/uL (ref 150–400)
RBC: 4.04 MIL/uL (ref 3.87–5.11)
RDW: 15.2 % (ref 11.5–15.5)
WBC: 15.7 10*3/uL — ABNORMAL HIGH (ref 4.0–10.5)
nRBC: 0 % (ref 0.0–0.2)

## 2023-01-28 LAB — URINALYSIS, W/ REFLEX TO CULTURE (INFECTION SUSPECTED)
Bilirubin Urine: NEGATIVE
Glucose, UA: NEGATIVE mg/dL
Ketones, ur: NEGATIVE mg/dL
Nitrite: POSITIVE — AB
Protein, ur: 30 mg/dL — AB
Specific Gravity, Urine: 1.015 (ref 1.005–1.030)
WBC, UA: >50 WBC/hpf (ref 0–5)
pH: 5 (ref 5.0–8.0)

## 2023-01-28 LAB — COMPREHENSIVE METABOLIC PANEL
ALT: 31 U/L (ref 0–44)
AST: 38 U/L (ref 15–41)
Albumin: 2.7 g/dL — ABNORMAL LOW (ref 3.5–5.0)
Alkaline Phosphatase: 76 U/L (ref 38–126)
Anion gap: 13 (ref 5–15)
BUN: 39 mg/dL — ABNORMAL HIGH (ref 8–23)
CO2: 31 mmol/L (ref 22–32)
Calcium: 8.3 mg/dL — ABNORMAL LOW (ref 8.9–10.3)
Chloride: 91 mmol/L — ABNORMAL LOW (ref 98–111)
Creatinine, Ser: 1.67 mg/dL — ABNORMAL HIGH (ref 0.44–1.00)
GFR, Estimated: 32 mL/min — ABNORMAL LOW (ref >60)
Glucose, Bld: 99 mg/dL (ref 70–99)
Potassium: 3.5 mmol/L (ref 3.5–5.1)
Sodium: 135 mmol/L (ref 135–145)
Total Bilirubin: 1.3 mg/dL — ABNORMAL HIGH (ref 0.3–1.2)
Total Protein: 6.8 g/dL (ref 6.5–8.1)

## 2023-01-28 LAB — LIPASE, BLOOD: Lipase: 22 U/L (ref 11–51)

## 2023-01-28 LAB — RESP PANEL BY RT-PCR (RSV, FLU A&B, COVID)  RVPGX2
Influenza A by PCR: NEGATIVE
Influenza B by PCR: NEGATIVE
Resp Syncytial Virus by PCR: NEGATIVE
SARS Coronavirus 2 by RT PCR: NEGATIVE

## 2023-01-28 LAB — I-STAT CG4 LACTIC ACID, ED: Lactic Acid, Venous: 1.7 mmol/L (ref 0.5–1.9)

## 2023-01-28 MED ORDER — INSULIN ASPART 100 UNIT/ML IJ SOLN: 0.0000 [IU] | Freq: Three times a day (TID) | INTRAMUSCULAR | Status: DC

## 2023-01-28 MED ORDER — ACETAMINOPHEN 325 MG PO TABS: 650.0000 mg | ORAL_TABLET | Freq: Four times a day (QID) | ORAL | Status: DC | PRN

## 2023-01-28 MED ORDER — ONDANSETRON HCL 4 MG PO TABS: 4.0000 mg | ORAL_TABLET | Freq: Four times a day (QID) | ORAL | Status: DC | PRN

## 2023-01-28 MED ORDER — POLYETHYLENE GLYCOL 3350 17 G PO PACK
17.0000 g | PACK | Freq: Every day | ORAL | Status: DC
  Filled 2023-01-28 (×5): qty 1

## 2023-01-28 MED ORDER — LACTATED RINGERS IV BOLUS
500.0000 mL | Freq: Once | INTRAVENOUS | Status: AC
  Administered 2023-01-28: 500 mL via INTRAVENOUS

## 2023-01-28 MED ORDER — ONDANSETRON HCL 4 MG/2ML IJ SOLN
4.0000 mg | Freq: Four times a day (QID) | INTRAMUSCULAR | Status: DC | PRN
  Administered 2023-01-29: 4 mg via INTRAVENOUS
  Filled 2023-01-28 (×2): qty 2

## 2023-01-28 MED ORDER — HEPARIN SODIUM (PORCINE) 5000 UNIT/ML IJ SOLN
5000.0000 [IU] | Freq: Three times a day (TID) | INTRAMUSCULAR | Status: DC
  Administered 2023-01-29 – 2023-02-07 (×30): 5000 [IU] via SUBCUTANEOUS
  Filled 2023-01-28 (×30): qty 1

## 2023-01-28 MED ORDER — IOHEXOL 350 MG/ML SOLN
60.0000 mL | Freq: Once | INTRAVENOUS | Status: AC | PRN
  Administered 2023-01-28: 60 mL via INTRAVENOUS

## 2023-01-28 MED ORDER — LACTATED RINGERS IV BOLUS
1000.0000 mL | Freq: Once | INTRAVENOUS | Status: AC
  Administered 2023-01-28: 1000 mL via INTRAVENOUS

## 2023-01-28 MED ORDER — INSULIN ASPART 100 UNIT/ML IJ SOLN: 0.0000 [IU] | Freq: Every day | INTRAMUSCULAR | Status: DC

## 2023-01-28 MED ORDER — ROPINIROLE HCL 1 MG PO TABS
1.0000 mg | ORAL_TABLET | Freq: Every day | ORAL | Status: DC
  Administered 2023-01-29: 1 mg via ORAL
  Filled 2023-01-28 (×2): qty 1

## 2023-01-28 MED ORDER — CLOPIDOGREL BISULFATE 75 MG PO TABS
75.0000 mg | ORAL_TABLET | Freq: Every morning | ORAL | Status: DC
  Administered 2023-01-30 – 2023-02-07 (×9): 75 mg via ORAL
  Filled 2023-01-28 (×9): qty 1

## 2023-01-28 MED ORDER — ACETAMINOPHEN 650 MG RE SUPP
650.0000 mg | Freq: Four times a day (QID) | RECTAL | Status: DC | PRN
  Administered 2023-01-29 – 2023-01-30 (×3): 650 mg via RECTAL
  Filled 2023-01-28 (×3): qty 1

## 2023-01-28 MED ORDER — EZETIMIBE 10 MG PO TABS
10.0000 mg | ORAL_TABLET | Freq: Every morning | ORAL | Status: DC
  Administered 2023-01-30 – 2023-02-07 (×9): 10 mg via ORAL
  Filled 2023-01-28 (×9): qty 1

## 2023-01-28 MED ORDER — SODIUM CHLORIDE 0.9 % IV SOLN
1.0000 g | Freq: Once | INTRAVENOUS | Status: AC
  Administered 2023-01-28: 1 g via INTRAVENOUS
  Filled 2023-01-28: qty 10

## 2023-01-28 MED ORDER — LACTATED RINGERS IV SOLN: INTRAVENOUS | Status: DC

## 2023-01-28 MED ORDER — ATORVASTATIN CALCIUM 80 MG PO TABS
80.0000 mg | ORAL_TABLET | Freq: Every day | ORAL | Status: DC
  Administered 2023-01-29 – 2023-02-06 (×9): 80 mg via ORAL
  Filled 2023-01-28 (×6): qty 1
  Filled 2023-01-28: qty 2
  Filled 2023-01-28 (×2): qty 1

## 2023-01-28 MED ORDER — SENNOSIDES-DOCUSATE SODIUM 8.6-50 MG PO TABS: 1.0000 | ORAL_TABLET | Freq: Every evening | ORAL | Status: DC | PRN

## 2023-01-28 MED ORDER — ASPIRIN 81 MG PO CHEW
81.0000 mg | CHEWABLE_TABLET | Freq: Every day | ORAL | Status: DC
  Administered 2023-01-30 – 2023-02-07 (×9): 81 mg via ORAL
  Filled 2023-01-28 (×9): qty 1

## 2023-01-28 MED ORDER — SODIUM CHLORIDE 0.9 % IV SOLN: 1.0000 g | INTRAVENOUS | Status: DC

## 2023-01-28 NOTE — ED Triage Notes (Signed)
Patient arrives by EMS from [facility] for possible UTI.  Staff reported patient had a fever of 100.8 today, and was given tylenol, also reports patient altered.  On arrival patient answers questions appropriately, but is delayed.  Patient reports having recent stroke.   Reports difficulty swallowing, states threw up last night.   CXR done at facility.   Patient reports they were unable to get urine sample.     EMS VITALS: 93/47 HR 81 96% RA 146 CBG  Patient given 200 ml of normal saline PTA.

## 2023-01-28 NOTE — ED Provider Notes (Signed)
  Physical Exam  BP (!) 138/109 (BP Location: Right Arm)   Pulse 88   Temp 98.6 F (37 C) (Oral)   Resp (!) 31   SpO2 99%   Physical Exam  Procedures  Procedures  ED Course / MDM   Clinical Course as of 01/28/23 2246  Sun Jan 28, 2023  2242 Consult to Dr. Joneen Roach, hospitalist, who is agreeable to seeing this patient and admitting her to her service. I appreciate her collaboration in the care of this patient.  [RS]    Clinical Course User Index [RS] Seairra Otani, Eugene Gavia, PA-C   Medical Decision Making Amount and/or Complexity of Data Reviewed Labs: ordered. Radiology: ordered.  Risk Prescription drug management. Decision regarding hospitalization.    I assumed care of this patient at time of shift change from Dr. Earlene Plater; please see his associated note for further insight into the care of this patient.  In brief 74 female with history of dementia presenting from [facility] with question of UTI.  Fever of 100.8 F today, altered from her baseline.  Patient pending hospitalist consult for admission for pyelonephritis with altered mental status at time of shift change.  Consult to hospitalist Dr. Joneen Roach, patient admitted to medicine service, remains hemodynamically stable at this time.  This chart was dictated using voice recognition software, Dragon. Despite the best efforts of this provider to proofread and correct errors, errors may still occur which can change documentation meaning.       Sherrilee Gilles 01/28/23 2246    Laurence Spates, MD 01/29/23 1600

## 2023-01-28 NOTE — ED Provider Notes (Signed)
[place] EMERGENCY DEPARTMENT AT [facility] Provider Note   CSN: [id number] Arrival date & time: 01/28/23  1742     History {Add pertinent medical, surgical, social history, OB history to HPI:1} Chief Complaint  Patient presents with   Urinary Tract Infection    [name] is a 77 y.o. female.  HPI 77 year old female history of dementia, type 2 diabetes, prior stroke, COPD, heart failure, hyperlipidemia presenting for confusion, fever.  Patient reported had a fever today to 100.8.  For the last couple days she has been confused.  There were concern for UTI at the facility per EMS, however unable to obtain a urinalysis.  She also has had some emesis.  Patient here knows her name, does not know she is in the hospital.  She endorses some recent cough and vomiting.  Denies any chest pain, shortness of breath, abdominal pain.  Denies any urinary symptoms although history is somewhat limited.  She denies any headache or weakness or numbness.     Home Medications Prior to Admission medications   Medication Sig Start Date End Date Taking? Authorizing Provider  atorvastatin (LIPITOR) 80 MG tablet Take 80 mg by mouth at bedtime.     [provider]  clopidogrel (PLAVIX) 75 MG tablet Take 75 mg by mouth every morning.     [provider]  ezetimibe (ZETIA) 10 MG tablet Take 10 mg by mouth every morning.     [provider]  gabapentin (NEURONTIN) 300 MG capsule Take 300 mg by mouth in the morning and at bedtime.    [provider]  HUMALOG 100 UNIT/ML injection Before each meal 3 times a day, 140-199 - 2 units, 200-250 - 4 units, 251-299 - 6 units,  300-349 - 8 units,  350 or above 10 units. 11/10/22   Leroy Sea, MD  insulin glargine (LANTUS) 100 UNIT/ML injection Inject 0.15 mLs (15 Units total) into the skin 2 (two) times daily. 11/10/22   Leroy Sea, MD  nitroGLYCERIN (NITROSTAT) 0.4 MG SL tablet Place 1 tablet (0.4 mg total) under  the tongue every 5 (five) minutes as needed for chest pain. 02/10/21   Baldo Daub, MD  rOPINIRole (REQUIP) 1 MG tablet Take 1 mg by mouth at bedtime.     [provider]  solifenacin (VESICARE) 10 MG tablet Take 10 mg by mouth daily. 08/10/22   [provider]  torsemide (DEMADEX) 100 MG tablet Take 50 mg by mouth 2 (two) times daily.    [provider]      Allergies    Alendronate and Valsartan    Review of Systems   Review of Systems Review of systems completed and notable as per HPI.  ROS otherwise negative.   Physical Exam Updated Vital Signs BP (!) 126/51   Pulse 87   Temp 98.6 F (37 C) (Oral)   Resp (!) 21   SpO2 91%  Physical Exam Vitals and nursing note reviewed.  Constitutional:      General: She is not in acute distress.    Appearance: She is well-developed.  HENT:     Head: Normocephalic and atraumatic.     Nose: Nose normal.     Mouth/Throat:     Mouth: Mucous membranes are moist.     Pharynx: Oropharynx is clear.  Eyes:     Extraocular Movements: Extraocular movements intact.     Conjunctiva/sclera: Conjunctivae normal.     Pupils: Pupils are equal, round, and  reactive to light.  Cardiovascular:     Rate and Rhythm: Normal rate and regular rhythm.     Heart sounds: No murmur heard. Pulmonary:     Effort: Pulmonary effort is normal. No respiratory distress.     Breath sounds: Normal breath sounds.  Abdominal:     Palpations: Abdomen is soft.     Tenderness: There is no abdominal tenderness. There is no guarding or rebound.  Musculoskeletal:        General: No swelling.     Cervical back: Normal range of motion and neck supple. No rigidity or tenderness.     Right lower leg: No edema.     Left lower leg: No edema.  Skin:    General: Skin is warm and dry.     Capillary Refill: Capillary refill takes less than 2 seconds.  Neurological:     Mental Status: She is alert.     Cranial Nerves: No cranial nerve deficit.      Sensory: No sensory deficit.     Motor: No weakness.     Comments: Awake and alert, oriented to name.  Able to answer questions, although does not know the place or the situation.  She is able to move all extremities antigravity.  Sensation is intact in all extremities.  No cranial nerve deficits.  Psychiatric:        Mood and Affect: Mood normal.     ED Results / Procedures / Treatments   Labs (all labs ordered are listed, but only abnormal results are displayed) Labs Reviewed  CBC WITH DIFFERENTIAL/PLATELET - Abnormal; Notable for the following components:      Result Value   WBC 15.7 (*)    Hemoglobin 11.7 (*)    Neutro Abs 12.5 (*)    Monocytes Absolute 1.7 (*)    All other components within normal limits  COMPREHENSIVE METABOLIC PANEL - Abnormal; Notable for the following components:   Chloride 91 (*)    BUN 39 (*)    Creatinine, Ser 1.67 (*)    Calcium 8.3 (*)    Albumin 2.7 (*)    Total Bilirubin 1.3 (*)    GFR, Estimated 32 (*)    All other components within normal limits  RESP PANEL BY RT-PCR (RSV, FLU A&B, COVID)  RVPGX2  CULTURE, BLOOD (ROUTINE X 2)  CULTURE, BLOOD (ROUTINE X 2)  LIPASE, BLOOD  URINALYSIS, W/ REFLEX TO CULTURE (INFECTION SUSPECTED)  I-STAT CG4 LACTIC ACID, ED  I-STAT CG4 LACTIC ACID, ED    EKG EKG Interpretation Date/Time:  Sunday January 28 2023 18:18:15 EDT Ventricular Rate:  79 PR Interval:  153 QRS Duration:  113 QT Interval:  412 QTC Calculation: 473 R Axis:   -38  Text Interpretation: Sinus rhythm Incomplete left bundle branch block Confirmed by Fulton Reek [phone]) on 01/28/2023 6:19:21 PM  Radiology CT ABDOMEN PELVIS W CONTRAST  Result Date: 01/28/2023 CLINICAL DATA:  Abdominal pain, acute, nonlocalized. Low-grade fever on arrival of the emergency services. EXAM: CT ABDOMEN AND PELVIS WITH CONTRAST TECHNIQUE: Multidetector CT imaging of the abdomen and pelvis was performed using the standard protocol following bolus  administration of intravenous contrast. RADIATION DOSE REDUCTION: This exam was performed according to the departmental dose-optimization program which includes automated exposure control, adjustment of the mA and/or kV according to patient size and/or use of iterative reconstruction technique. CONTRAST:  60mL OMNIPAQUE IOHEXOL 350 MG/ML SOLN COMPARISON:  CTs with IV contrast 06/23/2019 and 03/23/2019. FINDINGS: Lower chest: There is mild lower  lobe bronchial thickening. Posterior atelectasis in the lower lobes without lung base infiltrates. Loop recorder device left chest wall. The heart is slightly enlarged. There is no pericardial effusion. The coronary arteries and mitral ring are heavily calcified. Small hiatal hernia. Hepatobiliary: No focal liver abnormality is seen. Status post cholecystectomy. No biliary dilatation. Liver appears mildly steatotic. Pancreas: No abnormality. Spleen: There is a new 1.9 cm subcapsular linear hypodensity in the posterolateral midportion of the spleen on 3:21. Grossly this has the appearance of a grade 2 laceration, but since there is no perisplenic or subcapsular hematoma or evidence of a free hemorrhage, this is most likely a late-subacute or otherwise contained injury. Rest of the spleen enhanced homogeneously.  No splenomegaly. Adrenals/Urinary Tract: There is no adrenal mass. The kidneys enhanced fairly homogeneously in the initial images, but there are areas of faint hypoenhancement in both kidneys in the delayed phase concerning for pyelonephritis. Furthermore there is mild urothelial thickening in the renal pelves and proximal ureters suggesting further evidence of an ascending UTI. The bladder however, is not thickened. Stomach/Bowel: No bowel obstruction or inflammation is seen. An appendix is not seen in this patient. There is moderate fecal stasis. Scattered diverticulosis without evidence of colitis or diverticulitis. Vascular/Lymphatic: Atherosclerosis noted  aortoiliac systems in visceral branch arteries, with again noted small celiac trunk with collateralization through the pancreaticoduodenal branch of the SMA unchanged. No enlarged lymph nodes. Reproductive: Status post hysterectomy. No adnexal masses. Other: Mild rectus diastasis at and above the umbilicus and a tiny umbilical fat hernia. No incarcerated hernia. Calcified right mid abdominal lymph node cluster again noted. Trace deep pelvic ascites posteriorly with no free hemorrhage, free air or localizing collections. Musculoskeletal: Osteopenia and degenerative change thoracic and lumbar spine, disproportionate advanced facet hypertrophy L4-5 with acquired spinal stenosis and chronic grade 1 anterolisthesis. There is advanced arthrosis at the left hip with interval right hip replacement. One of the securing screws for the acetabular component extends posteriorly into the right gluteus medius muscle. Mild levoscoliosis. There are subacute partially healed displaced fractures of the right posterolateral seventh and eighth ribs. No acute or other significant osseous findings. IMPRESSION: 1. Faint areas of hypoenhancement in both kidneys in the delayed phase concerning for pyelonephritis. There is also mild urothelial thickening in the renal pelves and proximal ureters suggesting an ascending UTI, but there is no bladder thickening. 2. 1.9 cm subcapsular linear hypodensity in the posterolateral midportion of the spleen. Grossly this has the appearance of a grade 2 laceration, but since there is no perisplenic or subcapsular hematoma or evidence of a free hemorrhage, this is most likely a late-subacute or otherwise contained injury. 3. Subacute partially healed displaced fractures of the right posterolateral seventh and eighth ribs. 4. Cardiomegaly with calcific CAD and heavily calcified mitral ring. 5. Mild hepatic steatosis. 6. Constipation and diverticulosis. 7. Aortic and branch vessel atherosclerosis. 8. Interval  right hip replacement. One of the securing screws for the acetabular component extends posteriorly into the right gluteus medius muscle. 9. Osteopenia and degenerative change. Aortic Atherosclerosis (ICD10-I70.0). Electronically Signed   By: Almira Bar M.D.   On: 01/28/2023 21:46   CT Head Wo Contrast  Result Date: 01/28/2023 CLINICAL DATA:  Altered mental status EXAM: CT HEAD WITHOUT CONTRAST TECHNIQUE: Contiguous axial images were obtained from the base of the skull through the vertex without intravenous contrast. RADIATION DOSE REDUCTION: This exam was performed according to the departmental dose-optimization program which includes automated exposure control, adjustment of the mA and/or kV  according to patient size and/or use of iterative reconstruction technique. COMPARISON:  01/13/2023 FINDINGS: Brain: No evidence of acute infarction, hemorrhage, mass, mass effect, or midline shift. No hydrocephalus or extra-axial fluid collection. Left greater than right basal ganglia calcifications. Remote cortical infarcts in the right parietal and left occipital lobe. Remote lacunar infarcts in the right basal ganglia and left thalamus. Periventricular white matter changes, likely the sequela of chronic small vessel ischemic disease. Vascular: No hyperdense vessel. Atherosclerotic calcifications in the intracranial carotid and vertebral arteries. Skull: Negative for fracture or focal lesion. Sinuses/Orbits: Mucosal thickening in the ethmoid air cells. No acute finding in the orbits. Other: The mastoid air cells are well aerated. IMPRESSION: No acute intracranial process. Electronically Signed   By: Wiliam Ke M.D.   On: 01/28/2023 21:14   DG CHEST PORT 1 VIEW  Result Date: 01/28/2023 CLINICAL DATA:  Fever EXAM: PORTABLE CHEST 1 VIEW COMPARISON:  01/13/2023 FINDINGS: The heart size and mediastinal contours are within normal limits. Aortic atherosclerosis. Persistent area pleural thickening at the periphery of  the right lung base. Dense calcification of the mitral annulus. No focal airspace consolidation, pleural effusion, or pneumothorax. The visualized skeletal structures are unremarkable. IMPRESSION: 1. No active cardiopulmonary disease. 2. Persistent area of pleural thickening at the periphery of the right lung base. Electronically Signed   By: Duanne Guess D.O.   On: 01/28/2023 19:22    Procedures Procedures  {Document cardiac monitor, telemetry assessment procedure when appropriate:1}  Medications Ordered in ED Medications  lactated ringers bolus 500 mL (has no administration in time range)  cefTRIAXone (ROCEPHIN) 1 g in sodium chloride 0.9 % 100 mL IVPB (has no administration in time range)  lactated ringers bolus 1,000 mL (0 mLs Intravenous Stopped 01/28/23 2107)  iohexol (OMNIPAQUE) 350 MG/ML injection 60 mL (60 mLs Intravenous Contrast Given 01/28/23 2049)    ED Course/ Medical Decision Making/ A&P   {   Click here for ABCD2, HEART and other calculatorsREFRESH Note before signing :1}                              Medical Decision Making Amount and/or Complexity of Data Reviewed Labs: ordered. Radiology: ordered.  Risk Prescription drug management. Decision regarding hospitalization.   Medical Decision Making:   MARCHETTA SOLES is a 77 y.o. female who presented to the ED today with fever, confusion, emesis.  Here she is pleasantly confused.  Reportedly worse than baseline.  She is quite disoriented.  She reportedly had a fever at her facility, differential including possible infection, UTI, intra-abdominal infection given vomiting, pneumonia.  I do not see any signs of CNS infection at this time.  She is clear lungs bilaterally and normal work of breathing.  Will obtain lab workup as well as CT scan.   Patient placed on continuous vitals and telemetry monitoring while in ED which was reviewed periodically.  Reviewed and confirmed nursing documentation for past medical history,  family history, social history.  Reassessment and Plan:   On reassessment she remained stable.  Blood pressure improved after fluids.  Did not give full 30 cc/kg bolus given normal lactic acid and history of heart failure.  However so far is noted for leukocytosis, mild AKI.  Her CT scan is concerning for pyelonephritis.  They also report possible subacute splenic laceration.  She has not had any trauma or falls recently, suspect this is an older injury.  I ordered antibiotics for  her pyelonephritis, urinalysis is in process.  Will admit to hospitalist for further care.   Patient's presentation is most consistent with acute presentation with potential threat to life or bodily function.     {Document critical care time when appropriate:1} {Document review of labs and clinical decision tools ie heart score, Chads2Vasc2 etc:1}  {Document your independent review of radiology images, and any outside records:1} {Document your discussion with family members, caretakers, and with consultants:1} {Document social determinants of health affecting pt's care:1} {Document your decision making why or why not admission, treatments were needed:1} Final Clinical Impression(s) / ED Diagnoses Final diagnoses:  Pyelonephritis    Rx / DC Orders ED Discharge Orders     None

## 2023-01-28 NOTE — H&P (Addendum)
PCP:   Paulina Fusi, MD   Chief Complaint:  Altered mentation  HPI: This is a 77 year old female with past medical history of T2DM, HTN, HLD, RLS, HFpEF, COPD.  Patient is a resident of [facility].  Per patient she has been feeling off for the past 3 days.  2 days ago she became confused.  The facility was concerned for UTI.  They are unable to get a urine specimen.  She was brought to the ER.  In the ER patient is a bit more cognizant but weak in appearance.  Initial blood pressure soft 101/45.,  Otherwise vital stable.  Patient's lactic acid is normal 1.7, respiratory panel normal.  WBC 16.7, creatinine 1.67 previously normal, urine consistent with pyelonephritis,  subacute rib fractures, and constipation.  Blood cultures and urine cultures collected.  1 g IV Rocephin given.  1.5 L LR bolus given.  Respiratory panel negative.  Admission requested  Review of Systems:  Per HPI  Past Medical History: Past Medical History:  Diagnosis Date   Acute pain of left knee    Angina pectoris (HCC) 07/28/2016   With normal coronary arteriography   Carotid stenosis, right 11/18/2018   Cerebral infarction due to stenosis of right carotid artery (HCC) s/p tPA 11/15/2018   CHF (congestive heart failure) (HCC)    Chronic diastolic congestive heart failure (HCC) 07/28/2016   Chronic venous insufficiency 09/27/2021   COPD (chronic obstructive pulmonary disease) (HCC) 01/14/2018   Diabetes mellitus type II, uncontrolled 11/18/2018   Diabetes mellitus without complication (HCC)    Diabetic polyneuropathy associated with type 2 diabetes mellitus (HCC) 11/30/2015   Fall 11/21/2018   Family hx-stroke 11/18/2018   Hyperlipidemia    Hypertension    Hypertensive heart disease with heart failure (HCC) 07/28/2016   Living in nursing home    Lumbar disc narrowing    Onychogryposis of toenail 09/07/2020   Osteoporosis    Overgrown toenails 12/16/2020   Pustules determined by examination 04/11/2018    Spondylosis    lumbosacral   Stroke [facility])    Past Surgical History:  Procedure Laterality Date   ABDOMINAL HYSTERECTOMY     APPENDECTOMY     CARDIAC CATHETERIZATION     CHOLECYSTECTOMY     ENDARTERECTOMY Right 11/22/2018   Procedure: ENDARTERECTOMY CAROTID RIGHT;  Surgeon: Cephus Shelling, MD;  Location: [facility] OR;  Service: Vascular;  Laterality: Right;   HIP SURGERY  07/16/2019   Had to repair a lot of damage   KNEE ARTHROCENTESIS  11/22/2018       LOOP RECORDER INSERTION N/A 11/08/2022   Procedure: LOOP RECORDER INSERTION;  Surgeon: Regan Lemming, MD;  Location: [facility] INVASIVE CV LAB;  Service: Cardiovascular;  Laterality: N/A;   PATCH ANGIOPLASTY Right 11/22/2018   Procedure: Patch Angioplasty Right Carotid Artery using Xenosure Biologic Patch;  Surgeon: Cephus Shelling, MD;  Location: [facility] OR;  Service: Vascular;  Laterality: Right;    Medications: Prior to Admission medications   Medication Sig Start Date End Date Taking? Authorizing Provider  atorvastatin (LIPITOR) 80 MG tablet Take 80 mg by mouth at bedtime.     [provider]  clopidogrel (PLAVIX) 75 MG tablet Take 75 mg by mouth every morning.     [provider]  ezetimibe (ZETIA) 10 MG tablet Take 10 mg by mouth every morning.     [provider]  gabapentin (NEURONTIN) 300 MG capsule Take 300 mg by mouth in the morning and at bedtime.  [provider]  HUMALOG 100 UNIT/ML injection Before each meal 3 times a day, 140-199 - 2 units, 200-250 - 4 units, 251-299 - 6 units,  300-349 - 8 units,  350 or above 10 units. 11/10/22   Leroy Sea, MD  insulin glargine (LANTUS) 100 UNIT/ML injection Inject 0.15 mLs (15 Units total) into the skin 2 (two) times daily. 11/10/22   Leroy Sea, MD  nitroGLYCERIN (NITROSTAT) 0.4 MG SL tablet Place 1 tablet (0.4 mg total) under the tongue every 5 (five) minutes as needed for chest pain. 02/10/21   Baldo Daub, MD  rOPINIRole (REQUIP) 1 MG  tablet Take 1 mg by mouth at bedtime.     [provider]  solifenacin (VESICARE) 10 MG tablet Take 10 mg by mouth daily. 08/10/22   [provider]  torsemide (DEMADEX) 100 MG tablet Take 50 mg by mouth 2 (two) times daily.    [provider]    Allergies:   Allergies  Allergen Reactions   Alendronate     Other reaction(s): Other (See Comments) Makes me feel very weak   Valsartan Nausea And Vomiting    Social History:  reports that she has never smoked. She has never been exposed to tobacco smoke. She has never used smokeless tobacco. She reports that she does not currently use alcohol. She reports that she does not use drugs.  Family History: Family History  Problem Relation Age of Onset   Stroke Mother    Hypertension Mother    Diabetes Mother    Heart disease Mother    Prostate cancer Father    Alcohol abuse Father    CAD Sister    CAD Sister    Valvular heart disease Sister     Physical Exam: Vitals:   01/28/23 1825 01/28/23 2000 01/28/23 2015 01/28/23 2220  BP: (!) 101/45 113/74 (!) 126/51 (!) 138/109  Pulse: 81 87 87 88  Resp: 16 19 (!) 21 (!) 31  Temp: 98.6 F (37 C)     TempSrc: Oral     SpO2: 92% 90% 91% 99%    General: A&O x 3, morbidly obese, no acute distress Eyes: Pink conjunctiva, no scleral icterus ENT: Dry oral mucosa, neck supple, no thyromegaly Lungs: CTA B/L, no wheeze, no crackles, no use of accessory muscles Cardiovascular: RRR, 2/6 murmurs. No carotid bruits, no JVD Abdomen: soft, positive BS, non-tender, non-distended, no organomegaly, not an acute abdomen GU: not examined Neuro: CN II - XII grossly intact, sensation intact Musculoskeletal: strength 5/5 all extremities, no clubbing, cyanosis or edema Skin: Positive decubitus Psych: appropriate patient   Labs on Admission:  Recent Labs    01/28/23 1902  NA 135  K 3.5  CL 91*  CO2 31  GLUCOSE 99  BUN 39*  CREATININE 1.67*  CALCIUM 8.3*   Recent Labs     01/28/23 1902  AST 38  ALT 31  ALKPHOS 76  BILITOT 1.3*  PROT 6.8  ALBUMIN 2.7*   Recent Labs    01/28/23 1902  LIPASE 22   Recent Labs    01/28/23 1902  WBC 15.7*  NEUTROABS 12.5*  HGB 11.7*  HCT 36.0  MCV 89.1  PLT 213    Micro Results: Recent Results (from the past 240 hour(s))  Resp panel by RT-PCR (RSV, Flu A&B, Covid) Anterior Nasal Swab     Status: None   Collection Time: 01/28/23  7:03 PM   Specimen: Anterior Nasal Swab  Result Value Ref  Range Status   SARS Coronavirus 2 by RT PCR NEGATIVE NEGATIVE Final   Influenza A by PCR NEGATIVE NEGATIVE Final   Influenza B by PCR NEGATIVE NEGATIVE Final    Comment: (NOTE) The Xpert Xpress SARS-CoV-2/FLU/RSV plus assay is intended as an aid in the diagnosis of influenza from Nasopharyngeal swab specimens and should not be used as a sole basis for treatment. Nasal washings and aspirates are unacceptable for Xpert Xpress SARS-CoV-2/FLU/RSV testing.  Fact Sheet for Patients: BloggerCourse.com  Fact Sheet for Healthcare Providers: SeriousBroker.it  This test is not yet approved or cleared by the [place] FDA and has been authorized for detection and/or diagnosis of SARS-CoV-2 by FDA under an Emergency Use Authorization (EUA). This EUA will remain in effect (meaning this test can be used) for the duration of the COVID-19 declaration under Section 564(b)(1) of the Act, 21 U.S.C. section 360bbb-3(b)(1), unless the authorization is terminated or revoked.     Resp Syncytial Virus by PCR NEGATIVE NEGATIVE Final    Comment: (NOTE) Fact Sheet for Patients: BloggerCourse.com  Fact Sheet for Healthcare Providers: SeriousBroker.it  This test is not yet approved or cleared by the [place] FDA and has been authorized for detection and/or diagnosis of SARS-CoV-2 by FDA under an Emergency Use Authorization (EUA).  This EUA will remain in effect (meaning this test can be used) for the duration of the COVID-19 declaration under Section 564(b)(1) of the Act, 21 U.S.C. section 360bbb-3(b)(1), unless the authorization is terminated or revoked.  Performed at [facility], [address] [place], [place] [address]      Radiological Exams on Admission: CT ABDOMEN PELVIS W CONTRAST  Result Date: 01/28/2023 CLINICAL DATA:  Abdominal pain, acute, nonlocalized. Low-grade fever on arrival of the emergency services. EXAM: CT ABDOMEN AND PELVIS WITH CONTRAST TECHNIQUE: Multidetector CT imaging of the abdomen and pelvis was performed using the standard protocol following bolus administration of intravenous contrast. RADIATION DOSE REDUCTION: This exam was performed according to the departmental dose-optimization program which includes automated exposure control, adjustment of the mA and/or kV according to patient size and/or use of iterative reconstruction technique. CONTRAST:  60mL OMNIPAQUE IOHEXOL 350 MG/ML SOLN COMPARISON:  CTs with IV contrast 06/23/2019 and 03/23/2019. FINDINGS: Lower chest: There is mild lower lobe bronchial thickening. Posterior atelectasis in the lower lobes without lung base infiltrates. Loop recorder device left chest wall. The heart is slightly enlarged. There is no pericardial effusion. The coronary arteries and mitral ring are heavily calcified. Small hiatal hernia. Hepatobiliary: No focal liver abnormality is seen. Status post cholecystectomy. No biliary dilatation. Liver appears mildly steatotic. Pancreas: No abnormality. Spleen: There is a new 1.9 cm subcapsular linear hypodensity in the posterolateral midportion of the spleen on 3:21. Grossly this has the appearance of a grade 2 laceration, but since there is no perisplenic or subcapsular hematoma or evidence of a free hemorrhage, this is most likely a late-subacute or otherwise contained injury. Rest of the spleen enhanced  homogeneously.  No splenomegaly. Adrenals/Urinary Tract: There is no adrenal mass. The kidneys enhanced fairly homogeneously in the initial images, but there are areas of faint hypoenhancement in both kidneys in the delayed phase concerning for pyelonephritis. Furthermore there is mild urothelial thickening in the renal pelves and proximal ureters suggesting further evidence of an ascending UTI. The bladder however, is not thickened. Stomach/Bowel: No bowel obstruction or inflammation is seen. An appendix is not seen in this patient. There is moderate fecal stasis. Scattered diverticulosis without evidence of  colitis or diverticulitis. Vascular/Lymphatic: Atherosclerosis noted aortoiliac systems in visceral branch arteries, with again noted small celiac trunk with collateralization through the pancreaticoduodenal branch of the SMA unchanged. No enlarged lymph nodes. Reproductive: Status post hysterectomy. No adnexal masses. Other: Mild rectus diastasis at and above the umbilicus and a tiny umbilical fat hernia. No incarcerated hernia. Calcified right mid abdominal lymph node cluster again noted. Trace deep pelvic ascites posteriorly with no free hemorrhage, free air or localizing collections. Musculoskeletal: Osteopenia and degenerative change thoracic and lumbar spine, disproportionate advanced facet hypertrophy L4-5 with acquired spinal stenosis and chronic grade 1 anterolisthesis. There is advanced arthrosis at the left hip with interval right hip replacement. One of the securing screws for the acetabular component extends posteriorly into the right gluteus medius muscle. Mild levoscoliosis. There are subacute partially healed displaced fractures of the right posterolateral seventh and eighth ribs. No acute or other significant osseous findings. IMPRESSION: 1. Faint areas of hypoenhancement in both kidneys in the delayed phase concerning for pyelonephritis. There is also mild urothelial thickening in the renal  pelves and proximal ureters suggesting an ascending UTI, but there is no bladder thickening. 2. 1.9 cm subcapsular linear hypodensity in the posterolateral midportion of the spleen. Grossly this has the appearance of a grade 2 laceration, but since there is no perisplenic or subcapsular hematoma or evidence of a free hemorrhage, this is most likely a late-subacute or otherwise contained injury. 3. Subacute partially healed displaced fractures of the right posterolateral seventh and eighth ribs. 4. Cardiomegaly with calcific CAD and heavily calcified mitral ring. 5. Mild hepatic steatosis. 6. Constipation and diverticulosis. 7. Aortic and branch vessel atherosclerosis. 8. Interval right hip replacement. One of the securing screws for the acetabular component extends posteriorly into the right gluteus medius muscle. 9. Osteopenia and degenerative change. Aortic Atherosclerosis (ICD10-I70.0). Electronically Signed   By: Almira Bar M.D.   On: 01/28/2023 21:46   CT Head Wo Contrast  Result Date: 01/28/2023 CLINICAL DATA:  Altered mental status EXAM: CT HEAD WITHOUT CONTRAST TECHNIQUE: Contiguous axial images were obtained from the base of the skull through the vertex without intravenous contrast. RADIATION DOSE REDUCTION: This exam was performed according to the departmental dose-optimization program which includes automated exposure control, adjustment of the mA and/or kV according to patient size and/or use of iterative reconstruction technique. COMPARISON:  01/13/2023 FINDINGS: Brain: No evidence of acute infarction, hemorrhage, mass, mass effect, or midline shift. No hydrocephalus or extra-axial fluid collection. Left greater than right basal ganglia calcifications. Remote cortical infarcts in the right parietal and left occipital lobe. Remote lacunar infarcts in the right basal ganglia and left thalamus. Periventricular white matter changes, likely the sequela of chronic small vessel ischemic disease.  Vascular: No hyperdense vessel. Atherosclerotic calcifications in the intracranial carotid and vertebral arteries. Skull: Negative for fracture or focal lesion. Sinuses/Orbits: Mucosal thickening in the ethmoid air cells. No acute finding in the orbits. Other: The mastoid air cells are well aerated. IMPRESSION: No acute intracranial process. Electronically Signed   By: Wiliam Ke M.D.   On: 01/28/2023 21:14   DG CHEST PORT 1 VIEW  Result Date: 01/28/2023 CLINICAL DATA:  Fever EXAM: PORTABLE CHEST 1 VIEW COMPARISON:  01/13/2023 FINDINGS: The heart size and mediastinal contours are within normal limits. Aortic atherosclerosis. Persistent area pleural thickening at the periphery of the right lung base. Dense calcification of the mitral annulus. No focal airspace consolidation, pleural effusion, or pneumothorax. The visualized skeletal structures are unremarkable. IMPRESSION: 1. No active cardiopulmonary  disease. 2. Persistent area of pleural thickening at the periphery of the right lung base. Electronically Signed   By: Duanne Guess D.O.   On: 01/28/2023 19:22    Assessment/Plan Present on Admission:  Acute pyelonephritis //  Acute metabolic encephalopathy -Urine cultures collected -IV Rocephin initiated in ER, will continue.  Acute kidney injury -IV fluid hydration, LR at 75 cc an hour -BMP in a.m. -I/O daily -Torsemide 100 mg daily on hold   Constipation per CT -miralax BiD initiated  Anemia -Hemoglobin 11.7, down from 14 (11/09/22) -No reports of bleeding.  Hemoccult stool ordered   Chronic diastolic congestive heart failure (HCC) -Nebulizers as needed -Torsemide on hold   T2DM w/ Peripheral neuropathy -Sliding scale insulin, Lantus 10 units twice daily resumed -Neurontin held with patient altered mentation   COPD (chronic obstructive pulmonary disease) (HCC) -Nebulizers as needed  Dementia -Aware   HLD -Zetia and Lipitor resumed  H/o CVA -Continue Plavix, Lipitor,  Zetia   [name] 01/28/2023, 10:34 PM

## 2023-01-29 ENCOUNTER — Encounter: Payer: Self-pay | Admitting: Internal Medicine

## 2023-01-29 DIAGNOSIS — N10 Acute tubulo-interstitial nephritis: Secondary | ICD-10-CM | POA: Diagnosis not present

## 2023-01-29 DIAGNOSIS — A419 Sepsis, unspecified organism: Secondary | ICD-10-CM

## 2023-01-29 DIAGNOSIS — N12 Tubulo-interstitial nephritis, not specified as acute or chronic: Principal | ICD-10-CM | POA: Diagnosis present

## 2023-01-29 DIAGNOSIS — E119 Type 2 diabetes mellitus without complications: Secondary | ICD-10-CM | POA: Diagnosis not present

## 2023-01-29 DIAGNOSIS — I5032 Chronic diastolic (congestive) heart failure: Secondary | ICD-10-CM | POA: Diagnosis not present

## 2023-01-29 DIAGNOSIS — Z7189 Other specified counseling: Secondary | ICD-10-CM

## 2023-01-29 LAB — BASIC METABOLIC PANEL
Anion gap: 17 — ABNORMAL HIGH (ref 5–15)
Anion gap: 15 (ref 5–15)
BUN: 36 mg/dL — ABNORMAL HIGH (ref 8–23)
BUN: 40 mg/dL — ABNORMAL HIGH (ref 8–23)
CO2: 24 mmol/L (ref 22–32)
CO2: 22 mmol/L (ref 22–32)
Calcium: 7.9 mg/dL — ABNORMAL LOW (ref 8.9–10.3)
Calcium: 7.8 mg/dL — ABNORMAL LOW (ref 8.9–10.3)
Chloride: 94 mmol/L — ABNORMAL LOW (ref 98–111)
Chloride: 94 mmol/L — ABNORMAL LOW (ref 98–111)
Creatinine, Ser: 1.37 mg/dL — ABNORMAL HIGH (ref 0.44–1.00)
Creatinine, Ser: 1.68 mg/dL — ABNORMAL HIGH (ref 0.44–1.00)
GFR, Estimated: 40 mL/min — ABNORMAL LOW (ref >60)
GFR, Estimated: 31 mL/min — ABNORMAL LOW (ref >60)
Glucose, Bld: 109 mg/dL — ABNORMAL HIGH (ref 70–99)
Glucose, Bld: 271 mg/dL — ABNORMAL HIGH (ref 70–99)
Potassium: 3.3 mmol/L — ABNORMAL LOW (ref 3.5–5.1)
Potassium: 4.5 mmol/L (ref 3.5–5.1)
Sodium: 135 mmol/L (ref 135–145)
Sodium: 131 mmol/L — ABNORMAL LOW (ref 135–145)

## 2023-01-29 LAB — COMPREHENSIVE METABOLIC PANEL
ALT: 31 U/L (ref 0–44)
AST: 48 U/L — ABNORMAL HIGH (ref 15–41)
Albumin: 2.3 g/dL — ABNORMAL LOW (ref 3.5–5.0)
Alkaline Phosphatase: 140 U/L — ABNORMAL HIGH (ref 38–126)
Anion gap: 14 (ref 5–15)
BUN: 38 mg/dL — ABNORMAL HIGH (ref 8–23)
CO2: 27 mmol/L (ref 22–32)
Calcium: 7.9 mg/dL — ABNORMAL LOW (ref 8.9–10.3)
Chloride: 95 mmol/L — ABNORMAL LOW (ref 98–111)
Creatinine, Ser: 1.49 mg/dL — ABNORMAL HIGH (ref 0.44–1.00)
GFR, Estimated: 36 mL/min — ABNORMAL LOW (ref >60)
Glucose, Bld: 100 mg/dL — ABNORMAL HIGH (ref 70–99)
Potassium: 3.1 mmol/L — ABNORMAL LOW (ref 3.5–5.1)
Sodium: 136 mmol/L (ref 135–145)
Total Bilirubin: 1 mg/dL (ref 0.3–1.2)
Total Protein: 5.9 g/dL — ABNORMAL LOW (ref 6.5–8.1)

## 2023-01-29 LAB — CBC
HCT: 37.4 % (ref 36.0–46.0)
HCT: 37.6 % (ref 36.0–46.0)
Hemoglobin: 11.8 g/dL — ABNORMAL LOW (ref 12.0–15.0)
Hemoglobin: 12.1 g/dL (ref 12.0–15.0)
MCH: 28.3 pg (ref 26.0–34.0)
MCH: 29.7 pg (ref 26.0–34.0)
MCHC: 31.6 g/dL (ref 30.0–36.0)
MCHC: 32.2 g/dL (ref 30.0–36.0)
MCV: 89.7 fL (ref 80.0–100.0)
MCV: 92.4 fL (ref 80.0–100.0)
Platelets: 154 10*3/uL (ref 150–400)
Platelets: 170 10*3/uL (ref 150–400)
RBC: 4.17 MIL/uL (ref 3.87–5.11)
RBC: 4.07 MIL/uL (ref 3.87–5.11)
RDW: 15.1 % (ref 11.5–15.5)
RDW: 15.4 % (ref 11.5–15.5)
WBC: 8.9 10*3/uL (ref 4.0–10.5)
WBC: 22.5 10*3/uL — ABNORMAL HIGH (ref 4.0–10.5)
nRBC: 0 % (ref 0.0–0.2)
nRBC: 0 % (ref 0.0–0.2)

## 2023-01-29 LAB — AMMONIA: Ammonia: 25 umol/L (ref 9–35)

## 2023-01-29 LAB — I-STAT VENOUS BLOOD GAS, ED
Acid-Base Excess: 8 mmol/L — ABNORMAL HIGH (ref 0.0–2.0)
Bicarbonate: 30.5 mmol/L — ABNORMAL HIGH (ref 20.0–28.0)
Calcium, Ion: 0.99 mmol/L — ABNORMAL LOW (ref 1.15–1.40)
HCT: 30 % — ABNORMAL LOW (ref 36.0–46.0)
Hemoglobin: 10.2 g/dL — ABNORMAL LOW (ref 12.0–15.0)
O2 Saturation: 93 %
Potassium: 3.2 mmol/L — ABNORMAL LOW (ref 3.5–5.1)
Sodium: 137 mmol/L (ref 135–145)
TCO2: 32 mmol/L (ref 22–32)
pCO2, Ven: 35.8 mmHg — ABNORMAL LOW (ref 44–60)
pH, Ven: 7.538 — ABNORMAL HIGH (ref 7.25–7.43)
pO2, Ven: 60 mmHg — ABNORMAL HIGH (ref 32–45)

## 2023-01-29 LAB — BLOOD CULTURE ID PANEL (REFLEXED) - BCID2

## 2023-01-29 LAB — MRSA NEXT GEN BY PCR, NASAL: MRSA by PCR Next Gen: DETECTED — AB

## 2023-01-29 LAB — CBG MONITORING, ED: Glucose-Capillary: 90 mg/dL (ref 70–99)

## 2023-01-29 LAB — CBC WITH DIFFERENTIAL/PLATELET
Abs Immature Granulocytes: 0.11 10*3/uL — ABNORMAL HIGH (ref 0.00–0.07)
Basophils Absolute: 0 10*3/uL (ref 0.0–0.1)
Basophils Relative: 0 %
Eosinophils Absolute: 0 10*3/uL (ref 0.0–0.5)
Eosinophils Relative: 0 %
HCT: 36.2 % (ref 36.0–46.0)
Hemoglobin: 11.8 g/dL — ABNORMAL LOW (ref 12.0–15.0)
Immature Granulocytes: 4 %
Lymphocytes Relative: 11 %
Lymphs Abs: 0.3 10*3/uL — ABNORMAL LOW (ref 0.7–4.0)
MCH: 28.7 pg (ref 26.0–34.0)
MCHC: 32.6 g/dL (ref 30.0–36.0)
MCV: 88.1 fL (ref 80.0–100.0)
Monocytes Absolute: 0.1 10*3/uL (ref 0.1–1.0)
Monocytes Relative: 2 %
Neutro Abs: 2.3 10*3/uL (ref 1.7–7.7)
Neutrophils Relative %: 83 %
Platelets: 149 10*3/uL — ABNORMAL LOW (ref 150–400)
RBC: 4.11 MIL/uL (ref 3.87–5.11)
RDW: 15 % (ref 11.5–15.5)
WBC: 2.8 10*3/uL — ABNORMAL LOW (ref 4.0–10.5)
nRBC: 0 % (ref 0.0–0.2)

## 2023-01-29 LAB — GLUCOSE, CAPILLARY
Glucose-Capillary: 122 mg/dL — ABNORMAL HIGH (ref 70–99)
Glucose-Capillary: 179 mg/dL — ABNORMAL HIGH (ref 70–99)
Glucose-Capillary: 265 mg/dL — ABNORMAL HIGH (ref 70–99)
Glucose-Capillary: 238 mg/dL — ABNORMAL HIGH (ref 70–99)
Glucose-Capillary: 168 mg/dL — ABNORMAL HIGH (ref 70–99)

## 2023-01-29 LAB — PHOSPHORUS: Phosphorus: 2.3 mg/dL — ABNORMAL LOW (ref 2.5–4.6)

## 2023-01-29 LAB — CORTISOL: Cortisol, Plasma: 49 ug/dL

## 2023-01-29 LAB — BLOOD GAS, VENOUS
Acid-Base Excess: 5.2 mmol/L — ABNORMAL HIGH (ref 0.0–2.0)
Bicarbonate: 29.9 mmol/L — ABNORMAL HIGH (ref 20.0–28.0)
Drawn by: 62333
O2 Saturation: 77.9 %
Patient temperature: 38.2
pCO2, Ven: 45 mmHg (ref 44–60)
pH, Ven: 7.43 (ref 7.25–7.43)
pO2, Ven: 50 mmHg — ABNORMAL HIGH (ref 32–45)

## 2023-01-29 LAB — MAGNESIUM
Magnesium: 1.5 mg/dL — ABNORMAL LOW (ref 1.7–2.4)
Magnesium: 1.5 mg/dL — ABNORMAL LOW (ref 1.7–2.4)
Magnesium: 2 mg/dL (ref 1.7–2.4)

## 2023-01-29 LAB — I-STAT CG4 LACTIC ACID, ED: Lactic Acid, Venous: 3.3 mmol/L (ref 0.5–1.9)

## 2023-01-29 LAB — LACTIC ACID, PLASMA: Lactic Acid, Venous: 2.5 mmol/L (ref 0.5–1.9)

## 2023-01-29 LAB — TSH: TSH: 4.077 u[IU]/mL (ref 0.350–4.500)

## 2023-01-29 MED ORDER — SODIUM CHLORIDE 0.9 % IV BOLUS
250.0000 mL | INTRAVENOUS | Status: AC
  Administered 2023-01-29: 250 mL via INTRAVENOUS

## 2023-01-29 MED ORDER — VANCOMYCIN HCL 750 MG/150ML IV SOLN: 750.0000 mg | Freq: Two times a day (BID) | INTRAVENOUS | Status: DC

## 2023-01-29 MED ORDER — MAGNESIUM SULFATE 2 GM/50ML IV SOLN
2.0000 g | Freq: Once | INTRAVENOUS | Status: AC
  Administered 2023-01-29: 2 g via INTRAVENOUS
  Filled 2023-01-29: qty 50

## 2023-01-29 MED ORDER — CALCIUM GLUCONATE-NACL 2-0.675 GM/100ML-% IV SOLN
2.0000 g | Freq: Once | INTRAVENOUS | Status: AC
  Administered 2023-01-29: 2000 mg via INTRAVENOUS
  Filled 2023-01-29: qty 100

## 2023-01-29 MED ORDER — LACTATED RINGERS IV BOLUS
1000.0000 mL | Freq: Once | INTRAVENOUS | Status: AC
  Administered 2023-01-29: 1000 mL via INTRAVENOUS

## 2023-01-29 MED ORDER — CHLORHEXIDINE GLUCONATE CLOTH 2 % EX PADS
6.0000 | MEDICATED_PAD | Freq: Every day | CUTANEOUS | Status: DC
  Administered 2023-01-29 – 2023-02-07 (×10): 6 via TOPICAL

## 2023-01-29 MED ORDER — MAGNESIUM SULFATE 4 GM/100ML IV SOLN: 4.0000 g | Freq: Once | INTRAVENOUS | Status: DC

## 2023-01-29 MED ORDER — MUPIROCIN 2 % EX OINT
TOPICAL_OINTMENT | Freq: Two times a day (BID) | CUTANEOUS | Status: DC
  Administered 2023-01-29: 1 via NASAL
  Filled 2023-01-29 (×4): qty 22

## 2023-01-29 MED ORDER — NOREPINEPHRINE 4 MG/250ML-% IV SOLN: 0.0000 ug/min | INTRAVENOUS | Status: DC

## 2023-01-29 MED ORDER — INSULIN GLARGINE-YFGN 100 UNIT/ML ~~LOC~~ SOLN
5.0000 [IU] | Freq: Two times a day (BID) | SUBCUTANEOUS | Status: DC
  Administered 2023-01-29 – 2023-02-07 (×18): 5 [IU] via SUBCUTANEOUS
  Filled 2023-01-29 (×21): qty 0.05

## 2023-01-29 MED ORDER — POTASSIUM CHLORIDE 10 MEQ/100ML IV SOLN
10.0000 meq | INTRAVENOUS | Status: AC
  Administered 2023-01-29 (×4): 10 meq via INTRAVENOUS
  Filled 2023-01-29 (×3): qty 100

## 2023-01-29 MED ORDER — MIDODRINE HCL 5 MG PO TABS
20.0000 mg | ORAL_TABLET | ORAL | Status: DC
  Filled 2023-01-29: qty 4

## 2023-01-29 MED ORDER — LACTATED RINGERS IV BOLUS
1000.0000 mL | Freq: Once | INTRAVENOUS | Status: DC
Start: 2023-01-29 — End: 2023-01-29

## 2023-01-29 MED ORDER — PHENYLEPHRINE HCL-NACL 20-0.9 MG/250ML-% IV SOLN
0.0000 ug/min | INTRAVENOUS | Status: DC
  Administered 2023-01-29: 20 ug/min via INTRAVENOUS
  Filled 2023-01-29: qty 250

## 2023-01-29 MED ORDER — POTASSIUM PHOSPHATES 15 MMOLE/5ML IV SOLN
30.0000 mmol | Freq: Once | INTRAVENOUS | Status: AC
  Administered 2023-01-29: 30 mmol via INTRAVENOUS
  Filled 2023-01-29: qty 10

## 2023-01-29 MED ORDER — VANCOMYCIN HCL 750 MG/150ML IV SOLN: 750.0000 mg | INTRAVENOUS | Status: DC

## 2023-01-29 MED ORDER — GERHARDT'S BUTT CREAM
TOPICAL_CREAM | Freq: Two times a day (BID) | CUTANEOUS | Status: DC
  Administered 2023-01-29: 1 via TOPICAL
  Filled 2023-01-29 (×2): qty 1

## 2023-01-29 MED ORDER — POLYETHYLENE GLYCOL 3350 17 G PO PACK: 17.0000 g | PACK | Freq: Every day | ORAL | Status: DC | PRN

## 2023-01-29 MED ORDER — SODIUM CHLORIDE 0.9 % IV SOLN
1.0000 g | Freq: Two times a day (BID) | INTRAVENOUS | Status: DC
  Administered 2023-01-29 – 2023-01-31 (×5): 1 g via INTRAVENOUS
  Filled 2023-01-29 (×5): qty 20

## 2023-01-29 MED ORDER — VASOPRESSIN 20 UNITS/100 ML INFUSION FOR SHOCK
0.0400 [IU]/min | INTRAVENOUS | Status: DC
  Administered 2023-01-29: 0.04 [IU]/min via INTRAVENOUS
  Filled 2023-01-29: qty 100

## 2023-01-29 MED ORDER — NOREPINEPHRINE 4 MG/250ML-% IV SOLN: 2.0000 ug/min | INTRAVENOUS | Status: DC

## 2023-01-29 MED ORDER — NOREPINEPHRINE 4 MG/250ML-% IV SOLN
2.0000 ug/min | INTRAVENOUS | Status: DC
  Administered 2023-01-29: 8 ug/min via INTRAVENOUS
  Administered 2023-01-29: 3 ug/min via INTRAVENOUS
  Administered 2023-01-29: 14 ug/min via INTRAVENOUS
  Administered 2023-01-30: 9 ug/min via INTRAVENOUS
  Administered 2023-01-30 (×2): 7 ug/min via INTRAVENOUS
  Administered 2023-01-31: 6 ug/min via INTRAVENOUS
  Filled 2023-01-29 (×6): qty 250

## 2023-01-29 MED ORDER — METOPROLOL TARTRATE 25 MG PO TABS
12.5000 mg | ORAL_TABLET | Freq: Two times a day (BID) | ORAL | Status: DC
  Filled 2023-01-29: qty 1

## 2023-01-29 MED ORDER — VANCOMYCIN HCL 1500 MG/300ML IV SOLN
1500.0000 mg | Freq: Once | INTRAVENOUS | Status: AC
  Administered 2023-01-29: 1500 mg via INTRAVENOUS
  Filled 2023-01-29: qty 300

## 2023-01-29 MED ORDER — DOCUSATE SODIUM 100 MG PO CAPS: 100.0000 mg | ORAL_CAPSULE | Freq: Two times a day (BID) | ORAL | Status: DC | PRN

## 2023-01-29 MED ORDER — POTASSIUM CHLORIDE 10 MEQ/100ML IV SOLN
10.0000 meq | INTRAVENOUS | Status: AC
  Filled 2023-01-29: qty 100

## 2023-01-29 MED ORDER — LACTATED RINGERS BOLUS PEDS: 1000.0000 mL | Freq: Once | INTRAVENOUS | Status: DC

## 2023-01-29 MED ORDER — IPRATROPIUM-ALBUTEROL 0.5-2.5 (3) MG/3ML IN SOLN: 3.0000 mL | RESPIRATORY_TRACT | Status: DC | PRN

## 2023-01-29 MED ORDER — SODIUM CHLORIDE 0.9 % IV SOLN: 250.0000 mL | INTRAVENOUS | Status: DC

## 2023-01-29 MED ORDER — ORAL CARE MOUTH RINSE: 15.0000 mL | OROMUCOSAL | Status: DC | PRN

## 2023-01-29 MED ORDER — INSULIN ASPART 100 UNIT/ML IJ SOLN
0.0000 [IU] | INTRAMUSCULAR | Status: DC
  Administered 2023-01-29 (×2): 2 [IU] via SUBCUTANEOUS
  Administered 2023-01-29: 1 [IU] via SUBCUTANEOUS
  Administered 2023-01-29: 5 [IU] via SUBCUTANEOUS
  Administered 2023-01-29: 3 [IU] via SUBCUTANEOUS
  Administered 2023-01-30: 2 [IU] via SUBCUTANEOUS
  Administered 2023-01-30: 1 [IU] via SUBCUTANEOUS
  Administered 2023-01-30 (×2): 2 [IU] via SUBCUTANEOUS
  Administered 2023-01-30 – 2023-01-31 (×2): 1 [IU] via SUBCUTANEOUS
  Administered 2023-01-31: 2 [IU] via SUBCUTANEOUS
  Administered 2023-01-31 – 2023-02-01 (×2): 1 [IU] via SUBCUTANEOUS
  Administered 2023-02-01: 2 [IU] via SUBCUTANEOUS

## 2023-01-29 NOTE — Consult Note (Signed)
NAME:  [name], MRN:  [id number], DOB:  12/19/1945, LOS: 1 ADMISSION DATE:  01/28/2023, CONSULTATION DATE:  9/23 REFERRING MD:  Dr. Margo Aye, CHIEF COMPLAINT:  septic shock   History of Present Illness:  Patient is a 77 year old female with pertinent PMH T2DM, HTN, HLD, HFpEF, COPD presents to [facility] ED on 9/22 with septic shock.  Patient is a resident at [facility].  Patient has been increasingly more confused over the past 3 days.  On 9/22 came to [facility] ED for further workup.  On arrival BP soft 101/45.  Initially afebrile.  WBC 15.7.  Sepsis protocol initiated.  Cultures obtained, IV fluids given, started on vancomycin and Rocephin.  CXR with persistent area of pleural thickening at periphery of right lung base.  CT ABD/pelvis showing concern for pyelonephritis in both kidneys and mild urothelial thickening; 1.9 cm subscapular linear hypodensity on spleen appears to be grade 2 laceration likely late subacute; partially healed fractures of the right seventh and eighth ribs.  UA with many bacteria and positive nitrites.  Creatinine 1.67.  CT head no acute abnormality.  Patient admitted by [facility].  CCS consulted for splenic laceration.  Overnight patient noted to have wide-complex tachycardia with rates over 200s that would convert back to sinus tach ED spontaneously.  Patient more lethargic.  Patient more hypotensive with maps in 40s.  Fever now 102.9 F and antibiotics broadened to meropenem and vancomycin.  Despite gentle IV fluids patient remained hypotensive and required pressors.  PCCM consulted for ICU admission.  Pertinent  Medical History   Past Medical History:  Diagnosis Date   Acute pain of left knee    Angina pectoris (HCC) 07/28/2016   With normal coronary arteriography   Carotid stenosis, right 11/18/2018   Cerebral infarction due to stenosis of right carotid artery (HCC) s/p tPA 11/15/2018   CHF (congestive heart failure) (HCC)    Chronic diastolic congestive heart failure  (HCC) 07/28/2016   Chronic venous insufficiency 09/27/2021   COPD (chronic obstructive pulmonary disease) (HCC) 01/14/2018   Diabetes mellitus type II, uncontrolled 11/18/2018   Diabetes mellitus without complication (HCC)    Diabetic polyneuropathy associated with type 2 diabetes mellitus (HCC) 11/30/2015   Fall 11/21/2018   Family hx-stroke 11/18/2018   Hyperlipidemia    Hypertension    Hypertensive heart disease with heart failure (HCC) 07/28/2016   Living in nursing home    Lumbar disc narrowing    Onychogryposis of toenail 09/07/2020   Osteoporosis    Overgrown toenails 12/16/2020   Pustules determined by examination 04/11/2018   Spondylosis    lumbosacral   Stroke (HCC)      Significant Hospital Events: Including procedures, antibiotic start and stop dates in addition to other pertinent events   9/23 admitted w/ septic shock from uti w/ pyelo; now on pressors  Interim History / Subjective:  On neo 70  Objective   Blood pressure (!) 94/49, pulse (!) 105, temperature (!) 102.9 F (39.4 C), temperature source Axillary, resp. rate (!) 25, weight 73.5 kg, SpO2 100%.        Intake/Output Summary (Last 24 hours) at 01/29/2023 0411 Last data filed at 01/28/2023 2333 Gross per 24 hour  Intake 1600 ml  Output --  Net 1600 ml   Filed Weights   01/29/23 0322  Weight: 73.5 kg    Examination: General:  elderly critically ill appearing female  HEENT: MM pink/dry Neuro: lethargic but opens eyes to voice; perrl CV: s1s2, RRR, no  m/r/g PULM:  dim clear BS bilaterally GI: soft, bsx4 active  Extremities: warm/dry, no edema  Skin: no rashes or lesions appreciated   Resolved Hospital Problem list     Assessment & Plan:  Septic shock Acute pyelonephritis Plan: -switch neo to levo for map goal >65 -still appears dry; will give more iv fluids -trend LA -abx broadened to mero and vanc; -follow cultures -check pct -trend wbc/fever curve  Acute metabolic encephalopathy: likely  from sepsis as above Hx of CVA -CT head no acute abnormality Plan: -treat sepsis as above -limit sedating meds -check ammonia and tsh -resume statin, zetia, and DAPT when able to take po  Tachyarrhythmia: per nurse rate up to 200s but corrected spontaneously to NSR Hypokalemia Hypomagnesemia Plan: -replete electrolytes -repeat bmp and mag this afternoon -tele monitoring  AKI on CKD 3a Plan: -iv fluids -Trend BMP / urinary output -Replace electrolytes as indicated -Avoid nephrotoxic agents, ensure adequate renal perfusion  HFpEF HLD -echo on 11/2022 lvef 65-70%; grade II diastolic dysfunction Plan: -daily weight; strict I/o's -hold home meds for now  Constipation Plan: -bowel regimen  T2DM w/ peripheral neuropathy Plan: -ssi and cbg monitoring -hold gabapentin  COPD Plan: -prn duoneb  Subacute splenic laceration Plan: -per CCS  Best Practice (right click and "Reselect all SmartList Selections" daily)   Diet/type: NPO DVT prophylaxis: prophylactic heparin  GI prophylaxis: N/A Lines: N/A Foley:  N/A Code Status:  DNR Last date of multidisciplinary goals of care discussion [9/23 updated husband over phone]  Labs   CBC: Recent Labs  Lab 01/28/23 1902 01/29/23 0146 01/29/23 0356  WBC 15.7* 2.8*  --   NEUTROABS 12.5* 2.3  --   HGB 11.7* 11.8* 10.2*  HCT 36.0 36.2 30.0*  MCV 89.1 88.1  --   PLT 213 149*  --     Basic Metabolic Panel: Recent Labs  Lab 01/28/23 1902 01/29/23 0146 01/29/23 0356  NA 135 135 137  K 3.5 3.3* 3.2*  CL 91* 94*  --   CO2 31 24  --   GLUCOSE 99 109*  --   BUN 39* 36*  --   CREATININE 1.67* 1.37*  --   CALCIUM 8.3* 7.9*  --   MG  --  1.5*  --    GFR: Estimated Creatinine Clearance: 33.5 mL/min (A) (by C-G formula based on SCr of 1.37 mg/dL (H)). Recent Labs  Lab 01/28/23 1902 01/28/23 1922 01/29/23 0146 01/29/23 0403  WBC 15.7*  --  2.8*  --   LATICACIDVEN  --  1.7  --  3.3*    Liver Function  Tests: Recent Labs  Lab 01/28/23 1902  AST 38  ALT 31  ALKPHOS 76  BILITOT 1.3*  PROT 6.8  ALBUMIN 2.7*   Recent Labs  Lab 01/28/23 1902  LIPASE 22   No results for input(s): "AMMONIA" in the last 168 hours.  ABG    Component Value Date/Time   HCO3 30.5 (H) 01/29/2023 0356   TCO2 32 01/29/2023 0356   O2SAT 93 01/29/2023 0356     Coagulation Profile: No results for input(s): "INR", "PROTIME" in the last 168 hours.  Cardiac Enzymes: No results for input(s): "CKTOTAL", "CKMB", "CKMBINDEX", "TROPONINI" in the last 168 hours.  HbA1C: Hgb A1c MFr Bld  Date/Time Value Ref Range Status  11/08/2022 03:14 AM 8.2 (H) 4.8 - 5.6 % Final    Comment:    (NOTE) Pre diabetes:          5.7%-6.4%  Diabetes:              >  6.4%  Glycemic control for   <7.0% adults with diabetes   11/16/2018 03:15 AM 10.4 (H) 4.8 - 5.6 % Final    Comment:    (NOTE)         Prediabetes: 5.7 - 6.4         Diabetes: >6.4         Glycemic control for adults with diabetes: <7.0     CBG: Recent Labs  Lab 01/29/23 0000  GLUCAP 90    Review of Systems:   Patient is encephalopathic; therefore, history has been obtained from chart review.    Past Medical History:  She,  has a past medical history of Acute pain of left knee, Angina pectoris (HCC) (07/28/2016), Carotid stenosis, right (11/18/2018), Cerebral infarction due to stenosis of right carotid artery (HCC) s/p tPA (11/15/2018), CHF (congestive heart failure) (HCC), Chronic diastolic congestive heart failure (HCC) (07/28/2016), Chronic venous insufficiency (09/27/2021), COPD (chronic obstructive pulmonary disease) (HCC) (01/14/2018), Diabetes mellitus type II, uncontrolled (11/18/2018), Diabetes mellitus without complication (HCC), Diabetic polyneuropathy associated with type 2 diabetes mellitus (HCC) (11/30/2015), Fall (11/21/2018), Family hx-stroke (11/18/2018), Hyperlipidemia, Hypertension, Hypertensive heart disease with heart failure (HCC)  (07/28/2016), Living in nursing home, Lumbar disc narrowing, Onychogryposis of toenail (09/07/2020), Osteoporosis, Overgrown toenails (12/16/2020), Pustules determined by examination (04/11/2018), Spondylosis, and Stroke (HCC).   Surgical History:   Past Surgical History:  Procedure Laterality Date   ABDOMINAL HYSTERECTOMY     APPENDECTOMY     CARDIAC CATHETERIZATION     CHOLECYSTECTOMY     ENDARTERECTOMY Right 11/22/2018   Procedure: ENDARTERECTOMY CAROTID RIGHT;  Surgeon: Cephus Shelling, MD;  Location: [facility] OR;  Service: Vascular;  Laterality: Right;   HIP SURGERY  07/16/2019   Had to repair a lot of damage   KNEE ARTHROCENTESIS  11/22/2018       LOOP RECORDER INSERTION N/A 11/08/2022   Procedure: LOOP RECORDER INSERTION;  Surgeon: Regan Lemming, MD;  Location: [facility] INVASIVE CV LAB;  Service: Cardiovascular;  Laterality: N/A;   PATCH ANGIOPLASTY Right 11/22/2018   Procedure: Patch Angioplasty Right Carotid Artery using Xenosure Biologic Patch;  Surgeon: Cephus Shelling, MD;  Location: [facility] OR;  Service: Vascular;  Laterality: Right;     Social History:   reports that she has never smoked. She has never been exposed to tobacco smoke. She has never used smokeless tobacco. She reports that she does not currently use alcohol. She reports that she does not use drugs.   Family History:  Her family history includes Alcohol abuse in her father; CAD in her sister and sister; Diabetes in her mother; Heart disease in her mother; Hypertension in her mother; Prostate cancer in her father; Stroke in her mother; Valvular heart disease in her sister.   Allergies Allergies  Allergen Reactions   Alendronate Other (See Comments)    Muscle weakness  **Fosamax**   Valsartan Nausea And Vomiting     Home Medications  Prior to Admission medications   Medication Sig Start Date End Date Taking? Authorizing Provider  acetaminophen (TYLENOL) 650 MG CR tablet Take 650-1,300 mg by mouth every 8  (eight) hours as needed for pain or fever. Do no exceed more than 3000mg  in 24 hours   Yes [provider]  aspirin 81 MG chewable tablet Chew 81 mg by mouth daily.   Yes [provider]  atorvastatin (LIPITOR) 80 MG tablet Take 80 mg by mouth at bedtime.    Yes [provider]  clopidogrel (PLAVIX) 75 MG  tablet Take 75 mg by mouth every morning.    Yes [provider]  ezetimibe (ZETIA) 10 MG tablet Take 10 mg by mouth every morning.    Yes [provider]  gabapentin (NEURONTIN) 300 MG capsule Take 300 mg by mouth in the morning and at bedtime.   Yes [provider]  HUMALOG 100 UNIT/ML injection Before each meal 3 times a day, 140-199 - 2 units, 200-250 - 4 units, 251-299 - 6 units,  300-349 - 8 units,  350 or above 10 units. Patient taking differently: Inject 2-12 Units into the skin 3 (three) times daily before meals. Before each meal 3 times a day, 201-250- give 2 units, 251-300- give 4 units, 301-350- give 6 units, 351-400- give 8 units, 401--450- give 10 units, 451-600- give 12 units 11/10/22  Yes Leroy Sea, MD  insulin glargine (LANTUS) 100 UNIT/ML injection Inject 0.15 mLs (15 Units total) into the skin 2 (two) times daily. Patient taking differently: Inject 14 Units into the skin 2 (two) times daily. 11/10/22  Yes Leroy Sea, MD  nitroGLYCERIN (NITROSTAT) 0.4 MG SL tablet Place 1 tablet (0.4 mg total) under the tongue every 5 (five) minutes as needed for chest pain. 02/10/21  Yes Baldo Daub, MD  rOPINIRole (REQUIP) 1 MG tablet Take 1 mg by mouth at bedtime.    Yes [provider]  solifenacin (VESICARE) 10 MG tablet Take 10 mg by mouth daily. 08/10/22  Yes [provider]  torsemide (DEMADEX) 100 MG tablet Take 50 mg by mouth 2 (two) times daily.   Yes [provider]     Critical care time: 50 minutes    JD Anselm Lis [facility] 01/29/2023, 4:11 AM  Please see  [email or web address] for pager details.  From 7A-7P if no response, please call [phone]. After hours, please call ELink [phone].

## 2023-01-29 NOTE — Progress Notes (Signed)
Patient seen, examined and chart reviewed  77 year old female who was admitted with septic shock due to acute bilateral pyelonephritis, she was noted to have severe electrolyte abnormalities leading to wide-complex tachycardia overnight  Still remains on vasopressor support Encephalopathic  Physical exam: General: Acute on chronically ill-appearing female, lying on the bed HEENT: [place]/AT, eyes anicteric.  Dry mucus membranes Neuro: Opens eyes with vocal stimuli, not following commands, lethargic Chest: Coarse breath sounds, no wheezes or rhonchi Heart: Regular rate and rhythm, no murmurs or gallops Abdomen: Soft, nontender, nondistended, bowel sounds present Skin: No rash  Labs and images reviewed  Assessment plan: Septic shock due to bilateral acute pyelonephritis Acute septic encephalopathy Lactic acidosis Tachyarrhythmia due to electrolyte abnormalities Hypokalemia/hypophosphatemia/hypomagnesemia/hypocalcemia AKI on CKD stage IIIa Chronic HFpEF Diabetes type 2 with polyneuropathy Subacute splenic laceration Subacute rib fractures status post fall   Continue antibiotics with meropenem Vancomycin was discontinued Follow-up blood and urine culture Continue vasopressor support with MAP goal 65 Avoid sedation Trend lactate Continue telemetry monitoring Continue aggressive electrolyte replacement and monitor Monitor intake and output Avoid nephrotoxic agent Continue insulin CBG goal 140-180 Appreciate general surgery consult, continue watchful waiting   The patient is critically ill due to septic shock.  Critical care was necessary to treat or prevent imminent or life-threatening deterioration.  Critical care was time spent personally by me on the following activities: development of treatment plan with patient and/or surrogate as well as nursing, discussions with consultants, evaluation of patient's response to treatment, examination of patient, obtaining history from patient  or surrogate, ordering and performing treatments and interventions, ordering and review of laboratory studies, ordering and review of radiographic studies, pulse oximetry, re-evaluation of patient's condition and participation in multidisciplinary rounds.   During this encounter critical care time was devoted to patient care services described in this note for 35 minutes.     Cheri Fowler, MD  Pulmonary Critical Care See Amion for pager If no response to pager, please call [phone] until 7pm After 7pm, Please call E-link [phone]

## 2023-01-29 NOTE — TOC Initial Note (Signed)
Transition of Care [facility]) - Initial/Assessment Note    Patient Details  Name: [name] MRN: [id number] Date of Birth: 1945-12-22  [facility] CM/SW Contact:    Mearl Latin, LCSW Phone Number: 01/29/2023, 4:17 PM  Clinical Narrative:                 Patient admitted from [facility] where she has been since July. CSW following for discharge planning and ability to return to [facility].   Expected Discharge Plan: Skilled Nursing Facility Barriers to Discharge: Continued Medical Work up   Patient Goals and CMS Choice            Expected Discharge Plan and Services In-house Referral: Clinical Social Work     Living arrangements for the past 2 months: Skilled Nursing Facility                                      Prior Living Arrangements/Services Living arrangements for the past 2 months: Skilled Nursing Facility Lives with:: Facility Resident Patient language and need for interpreter reviewed:: Yes        Need for Family Participation in Patient Care: Yes (Comment) Care giver support system in place?: Yes (comment)   Criminal Activity/Legal Involvement Pertinent to Current Situation/Hospitalization: No - Comment as needed  Activities of Daily Living      Permission Sought/Granted                  Emotional Assessment Appearance:: Appears stated age Attitude/Demeanor/Rapport: Unable to Assess Affect (typically observed): Unable to Assess Orientation: : Oriented to Self, Oriented to Place Alcohol / Substance Use: Not Applicable Psych Involvement: No (comment)  Admission diagnosis:  Pyelonephritis [N12] Acute pyelonephritis [N10] Septic shock (HCC) [A41.9, R65.21] Patient Active Problem List   Diagnosis Date Noted   Pyelonephritis 01/29/2023   Septic shock (HCC) 01/29/2023   Goals of care, counseling/discussion 01/29/2023   Acute pyelonephritis 01/28/2023   Syncope and collapse 11/07/2022   Right 7-9th rib fractures  11/07/2022   Chronic venous insufficiency 09/27/2021   Overgrown toenails 12/16/2020   Onychogryposis of toenail 09/07/2020   Cerebrovascular disease    Spondylosis    Osteoporosis    Lumbar disc narrowing    Living in nursing home    Hypertension    Diabetes mellitus without complication (HCC)    CHF (congestive heart failure) (HCC)    Acute pain of left knee    Fall 11/21/2018   Carotid stenosis, right 11/18/2018   Uncontrolled type 2 diabetes mellitus with hypoglycemia, with long-term current use of insulin (HCC) 11/18/2018   Family hx-stroke 11/18/2018   Cerebral infarction due to stenosis of right carotid artery (HCC) s/p tPA 11/15/2018   Pustules determined by examination 04/11/2018   Hyperlipidemia 01/14/2018   COPD (chronic obstructive pulmonary disease) (HCC) 01/14/2018   Angina pectoris (HCC) 07/28/2016   Chronic diastolic congestive heart failure (HCC) 07/28/2016   Hypertensive heart disease with heart failure (HCC) 07/28/2016   Diabetic polyneuropathy associated with type 2 diabetes mellitus (HCC) 11/30/2015   Pain due to onychomycosis of toenail of right foot 08/17/2015   PCP:  Paulina Fusi, MD Pharmacy:   [facility] - [place], [place] - [address] [place] [address] Phone: [phone] Fax: [phone]     Social Determinants of Health (SDOH) Social History: SDOH Screenings   Food  Insecurity: No Food Insecurity (11/08/2022)  Housing: Low Risk  (11/08/2022)  Transportation Needs: No Transportation Needs (11/08/2022)  Utilities: Not At Risk (11/08/2022)  Alcohol Screen: Low Risk  (12/25/2018)  Depression (PHQ2-9): Low Risk  (12/25/2018)  Financial Resource Strain: Low Risk  (12/20/2018)  Physical Activity: Inactive (12/20/2018)  Social Connections: Moderately Integrated (12/25/2018)  Stress: Stress Concern Present (12/20/2018)  Tobacco Use: Low Risk  (01/28/2023)   SDOH Interventions:     Readmission Risk Interventions     No data  to display

## 2023-01-29 NOTE — ED Notes (Signed)
Attempted to administer oral medication.  Patient now drowsy.  Will open eyes to voice, but will not verbalize if she feels like she can take medication.  Earlier oral doses were given with apple sauce and patient was able to voice when she was ready.  No coughing was noted prior.  Patient did state that she had been having problems swallowing.    Patient unable to take oral medication at this time.  Admit MD made aware.

## 2023-01-29 NOTE — ED Notes (Signed)
Phone report given to ICU RN at this time

## 2023-01-29 NOTE — Consult Note (Signed)
WOC Nurse Consult Note: Reason for Consult: Consult requested for bilat buttocks.  Pt is frequently incontinent of stool and has red macerated skin to bilat buttocks/sacrum/perineum with patchy areas of partial thickness skin loss, painful to touch, appearance is consistent with moisture associated skin damage.  It is difficult to promote healing when the area is frequently soiled; approx 6X6cm. Cleaned and linens changed.  Dressing procedure/placement/frequency: Topical treatment orders provided for bedside nurses to perform as follows to protect skin and repel moisture: Apply Gerhardts cream to bilat buttocks BID and PRN with each turning and cleaning episode. Leave dressing off buttocks/sacrum since it is trapping stool underneath. Please re-consult if further assistance is needed.  Thank-you,  Cammie Mcgee MSN, RN, CWOCN, [place], CNS [phone]

## 2023-01-29 NOTE — Consult Note (Signed)
Consulting Physician: Hyman Hopes Eliazer Hemphill  Referring Provider: Dr. hall  Chief Complaint: Altered Mentation  Reason for Consult: Splenic laceration   Subjective   HPI: [name] is an 77 y.o. female who is here for altered mentation.  The patient is not responding to me at this point limiting our interview.  Looks like she came in from [facility] with altered mental status, concern for possible UTI.  She had a stroke recently.  She has had syncope issues and fell recently and had rib fractures on 11/07/22.  No CT was done a the time of the rib fractures.  Past Medical History:  Diagnosis Date   Acute pain of left knee    Angina pectoris (HCC) 07/28/2016   With normal coronary arteriography   Carotid stenosis, right 11/18/2018   Cerebral infarction due to stenosis of right carotid artery (HCC) s/p tPA 11/15/2018   CHF (congestive heart failure) (HCC)    Chronic diastolic congestive heart failure (HCC) 07/28/2016   Chronic venous insufficiency 09/27/2021   COPD (chronic obstructive pulmonary disease) (HCC) 01/14/2018   Diabetes mellitus type II, uncontrolled 11/18/2018   Diabetes mellitus without complication (HCC)    Diabetic polyneuropathy associated with type 2 diabetes mellitus (HCC) 11/30/2015   Fall 11/21/2018   Family hx-stroke 11/18/2018   Hyperlipidemia    Hypertension    Hypertensive heart disease with heart failure (HCC) 07/28/2016   Living in nursing home    Lumbar disc narrowing    Onychogryposis of toenail 09/07/2020   Osteoporosis    Overgrown toenails 12/16/2020   Pustules determined by examination 04/11/2018   Spondylosis    lumbosacral   Stroke [facility])     Past Surgical History:  Procedure Laterality Date   ABDOMINAL HYSTERECTOMY     APPENDECTOMY     CARDIAC CATHETERIZATION     CHOLECYSTECTOMY     ENDARTERECTOMY Right 11/22/2018   Procedure: ENDARTERECTOMY CAROTID RIGHT;  Surgeon: Cephus Shelling, MD;  Location: [facility] OR;  Service: Vascular;   Laterality: Right;   HIP SURGERY  07/16/2019   Had to repair a lot of damage   KNEE ARTHROCENTESIS  11/22/2018       LOOP RECORDER INSERTION N/A 11/08/2022   Procedure: LOOP RECORDER INSERTION;  Surgeon: Regan Lemming, MD;  Location: [facility] INVASIVE CV LAB;  Service: Cardiovascular;  Laterality: N/A;   PATCH ANGIOPLASTY Right 11/22/2018   Procedure: Patch Angioplasty Right Carotid Artery using Xenosure Biologic Patch;  Surgeon: Cephus Shelling, MD;  Location: [facility] OR;  Service: Vascular;  Laterality: Right;    Family History  Problem Relation Age of Onset   Stroke Mother    Hypertension Mother    Diabetes Mother    Heart disease Mother    Prostate cancer Father    Alcohol abuse Father    CAD Sister    CAD Sister    Valvular heart disease Sister     Social:  reports that she has never smoked. She has never been exposed to tobacco smoke. She has never used smokeless tobacco. She reports that she does not currently use alcohol. She reports that she does not use drugs.  Allergies:  Allergies  Allergen Reactions   Alendronate Other (See Comments)    Muscle weakness  **Fosamax**   Valsartan Nausea And Vomiting    Medications: Current Outpatient Medications  Medication Instructions   acetaminophen (TYLENOL) 650-1,300 mg, Oral, Every 8 hours PRN, Do no exceed more than 3000mg  in 24 hours  aspirin 81 mg, Oral, Daily   atorvastatin (LIPITOR) 80 mg, Oral, Daily at bedtime   clopidogrel (PLAVIX) 75 mg, Oral, Every morning   ezetimibe (ZETIA) 10 mg, Oral, Every morning   gabapentin (NEURONTIN) 300 mg, Oral, 2 times daily   HUMALOG 100 UNIT/ML injection Before each meal 3 times a day, 140-199 - 2 units, 200-250 - 4 units, 251-299 - 6 units,  300-349 - 8 units,  350 or above 10 units.   insulin glargine (LANTUS) 15 Units, Subcutaneous, 2 times daily   nitroGLYCERIN (NITROSTAT) 0.4 mg, Sublingual, Every 5 min PRN   rOPINIRole (REQUIP) 1 mg, Oral, Daily at bedtime   solifenacin  (VESICARE) 10 mg, Oral, Daily   torsemide (DEMADEX) 50 mg, Oral, 2 times daily    ROS - all of the below systems have been reviewed with the patient and positives are indicated with bold text General: chills, fever or night sweats Eyes: blurry vision or double vision ENT: epistaxis or sore throat Allergy/Immunology: itchy/watery eyes or nasal congestion Hematologic/Lymphatic: bleeding problems, blood clots or swollen lymph nodes Endocrine: temperature intolerance or unexpected weight changes Breast: new or changing breast lumps or nipple discharge Resp: cough, shortness of breath, or wheezing CV: chest pain or dyspnea on exertion GI: as per HPI GU: dysuria, trouble voiding, or hematuria MSK: joint pain or joint stiffness Neuro: TIA or stroke symptoms Derm: pruritus and skin lesion changes Psych: anxiety and depression  Objective   PE Blood pressure (!) 125/59, pulse (!) 117, temperature (!) 102.9 F (39.4 C), temperature source Axillary, resp. rate (!) 25, weight 73.5 kg, SpO2 94%. Constitutional: NAD; conversant; no deformities Eyes: Moist conjunctiva; no lid lag; anicteric; PERRL Neck: Trachea midline; no thyromegaly Lungs: Normal respiratory effort; no tactile fremitus CV: RRR; no palpable thrills; no pitting edema GI: Abd Soft, nontender; no palpable hepatosplenomegaly MSK: Normal range of motion of extremities; no clubbing/cyanosis Psychiatric: Not answering questions Lymphatic: No palpable cervical or axillary lymphadenopathy  Results for orders placed or performed during the hospital encounter of 01/28/23 (from the past 24 hour(s))  CBC with Differential     Status: Abnormal   Collection Time: 01/28/23  7:02 PM  Result Value Ref Range   WBC 15.7 (H) 4.0 - 10.5 K/uL   RBC 4.04 3.87 - 5.11 MIL/uL   Hemoglobin 11.7 (L) 12.0 - 15.0 g/dL   HCT 16.1 09.6 - 04.5 %   MCV 89.1 80.0 - 100.0 fL   MCH 29.0 26.0 - 34.0 pg   MCHC 32.5 30.0 - 36.0 g/dL   RDW 40.9 81.1 - 91.4 %    Platelets 213 150 - 400 K/uL   nRBC 0.0 0.0 - 0.2 %   Neutrophils Relative % 80 %   Neutro Abs 12.5 (H) 1.7 - 7.7 K/uL   Lymphocytes Relative 9 %   Lymphs Abs 1.4 0.7 - 4.0 K/uL   Monocytes Relative 11 %   Monocytes Absolute 1.7 (H) 0.1 - 1.0 K/uL   Eosinophils Relative 0 %   Eosinophils Absolute 0.0 0.0 - 0.5 K/uL   Basophils Relative 0 %   Basophils Absolute 0.1 0.0 - 0.1 K/uL   Immature Granulocytes 0 %   Abs Immature Granulocytes 0.07 0.00 - 0.07 K/uL  Comprehensive metabolic panel     Status: Abnormal   Collection Time: 01/28/23  7:02 PM  Result Value Ref Range   Sodium 135 135 - 145 mmol/L   Potassium 3.5 3.5 - 5.1 mmol/L   Chloride 91 (L) 98 -  111 mmol/L   CO2 31 22 - 32 mmol/L   Glucose, Bld 99 70 - 99 mg/dL   BUN 39 (H) 8 - 23 mg/dL   Creatinine, Ser 6.21 (H) 0.44 - 1.00 mg/dL   Calcium 8.3 (L) 8.9 - 10.3 mg/dL   Total Protein 6.8 6.5 - 8.1 g/dL   Albumin 2.7 (L) 3.5 - 5.0 g/dL   AST 38 15 - 41 U/L   ALT 31 0 - 44 U/L   Alkaline Phosphatase 76 38 - 126 U/L   Total Bilirubin 1.3 (H) 0.3 - 1.2 mg/dL   GFR, Estimated 32 (L) >60 mL/min   Anion gap 13 5 - 15  Lipase, blood     Status: None   Collection Time: 01/28/23  7:02 PM  Result Value Ref Range   Lipase 22 11 - 51 U/L  Resp panel by RT-PCR (RSV, Flu A&B, Covid) Anterior Nasal Swab     Status: None   Collection Time: 01/28/23  7:03 PM   Specimen: Anterior Nasal Swab  Result Value Ref Range   SARS Coronavirus 2 by RT PCR NEGATIVE NEGATIVE   Influenza A by PCR NEGATIVE NEGATIVE   Influenza B by PCR NEGATIVE NEGATIVE   Resp Syncytial Virus by PCR NEGATIVE NEGATIVE  I-Stat Lactic Acid     Status: None   Collection Time: 01/28/23  7:22 PM  Result Value Ref Range   Lactic Acid, Venous 1.7 0.5 - 1.9 mmol/L  Urinalysis, w/ Reflex to Culture (Infection Suspected) -Urine, Clean Catch     Status: Abnormal   Collection Time: 01/28/23  9:48 PM  Result Value Ref Range   Specimen Source URINE, CLEAN CATCH    Color,  Urine YELLOW YELLOW   APPearance TURBID (A) CLEAR   Specific Gravity, Urine 1.015 1.005 - 1.030   pH 5.0 5.0 - 8.0   Glucose, UA NEGATIVE NEGATIVE mg/dL   Hgb urine dipstick MODERATE (A) NEGATIVE   Bilirubin Urine NEGATIVE NEGATIVE   Ketones, ur NEGATIVE NEGATIVE mg/dL   Protein, ur 30 (A) NEGATIVE mg/dL   Nitrite POSITIVE (A) NEGATIVE   Leukocytes,Ua LARGE (A) NEGATIVE   RBC / HPF 21-50 0 - 5 RBC/hpf   WBC, UA >50 0 - 5 WBC/hpf   Bacteria, UA MANY (A) NONE SEEN   Squamous Epithelial / HPF 11-20 0 - 5 /HPF  CBG monitoring, ED     Status: None   Collection Time: 01/29/23 12:00 AM  Result Value Ref Range   Glucose-Capillary 90 70 - 99 mg/dL  Basic metabolic panel     Status: Abnormal   Collection Time: 01/29/23  1:46 AM  Result Value Ref Range   Sodium 135 135 - 145 mmol/L   Potassium 3.3 (L) 3.5 - 5.1 mmol/L   Chloride 94 (L) 98 - 111 mmol/L   CO2 24 22 - 32 mmol/L   Glucose, Bld 109 (H) 70 - 99 mg/dL   BUN 36 (H) 8 - 23 mg/dL   Creatinine, Ser 3.08 (H) 0.44 - 1.00 mg/dL   Calcium 7.9 (L) 8.9 - 10.3 mg/dL   GFR, Estimated 40 (L) >60 mL/min   Anion gap 17 (H) 5 - 15  Magnesium     Status: Abnormal   Collection Time: 01/29/23  1:46 AM  Result Value Ref Range   Magnesium 1.5 (L) 1.7 - 2.4 mg/dL  CBC with Differential/Platelet     Status: Abnormal   Collection Time: 01/29/23  1:46 AM  Result Value Ref Range  WBC 2.8 (L) 4.0 - 10.5 K/uL   RBC 4.11 3.87 - 5.11 MIL/uL   Hemoglobin 11.8 (L) 12.0 - 15.0 g/dL   HCT 16.1 09.6 - 04.5 %   MCV 88.1 80.0 - 100.0 fL   MCH 28.7 26.0 - 34.0 pg   MCHC 32.6 30.0 - 36.0 g/dL   RDW 40.9 81.1 - 91.4 %   Platelets 149 (L) 150 - 400 K/uL   nRBC 0.0 0.0 - 0.2 %   Neutrophils Relative % 83 %   Neutro Abs 2.3 1.7 - 7.7 K/uL   Lymphocytes Relative 11 %   Lymphs Abs 0.3 (L) 0.7 - 4.0 K/uL   Monocytes Relative 2 %   Monocytes Absolute 0.1 0.1 - 1.0 K/uL   Eosinophils Relative 0 %   Eosinophils Absolute 0.0 0.0 - 0.5 K/uL   Basophils  Relative 0 %   Basophils Absolute 0.0 0.0 - 0.1 K/uL   Immature Granulocytes 4 %   Abs Immature Granulocytes 0.11 (H) 0.00 - 0.07 K/uL     Imaging Orders         DG CHEST PORT 1 VIEW         CT ABDOMEN PELVIS W CONTRAST         CT Head Wo Contrast      Assessment and Plan   [name] is an 77 y.o. female with multiple medical issues, recent rib fractures documented on 11/07/22, with no CT at that time now also found to have a subacute grade 2 splenic injury.  I do not think these imaging findings are driving her illness.  It seems her current presentation is more related to the pyelonephritis.  I do not recommend any acute intervention on the spleen.  Vitals, hemoglobin and abdominal exam should be followed.  Trauma team will follow.    Quentin Ore, MD  [facility] Use [email or web address] to contact on call provider  New Patient Billing: [id number] - High MDM

## 2023-01-29 NOTE — Progress Notes (Signed)
Patient seen earlier today for concern for subacute splenic laceration from a fall 2 months ago.  Her hgb has uptrended to 12 from 10 and appears septic secondary to pyelonephritis.  No acute intervention warranted for this findings.  We will be available as needed.  [name] 4:31 PM 01/29/2023

## 2023-01-29 NOTE — ED Notes (Signed)
Patient noted to have increasing heart rate.  RN to bedside and patient c/o chills and "teeth falling out."  Patient provided with warm blankets.  HR continued to increase.

## 2023-01-29 NOTE — Progress Notes (Signed)
IVT consult placed due to vasopressor use. Patient has 4 PIV's currently, all infusing. Receiving levo and vasopressin in bilateral AC's. [place] used to confirm tip placement in both sites. Tip marked for iwatch placement. At this time, do not recommend placing additional access given appropriate access.If additional access beyond 4 sites is needed, consider alternatives. Primary RN notified of findings and instructed to place new consult if status changes.  Patton Swisher Loyola Mast, RN

## 2023-01-29 NOTE — Progress Notes (Signed)
Pharmacy Antibiotic Note  [name] is a 77 y.o. female admitted on 01/28/2023 with concern for pyelonephritis. While in the emergency department, required pressor support for hypotension. Pharmacy has been consulted for meropenem and vancomycin dosing.  Plan: Vancomycin 1500mg  load x 1 followed by vancomycin 750mg  q24h (eAUC 499, Scr 1.37) Meropenem 1g q12h F/u renal function, micro data, and narrow as able Vancomycin levels as indicated   Weight: 73.5 kg (162 lb 0.6 oz)  Temp (24hrs), Avg:100 F (37.8 C), Min:98.5 F (36.9 C), Max:102.9 F (39.4 C)  Recent Labs  Lab 01/28/23 1902 01/28/23 1922 01/29/23 0146  WBC 15.7*  --  2.8*  CREATININE 1.67*  --  1.37*  LATICACIDVEN  --  1.7  --     Estimated Creatinine Clearance: 33.5 mL/min (A) (by C-G formula based on SCr of 1.37 mg/dL (H)).    Allergies  Allergen Reactions   Alendronate Other (See Comments)    Muscle weakness  **Fosamax**   Valsartan Nausea And Vomiting    Antimicrobials this admission: Ceftriaxone 9/22 x 1 Meropenem 9/23 > Vancomycin 9/23 >  Microbiology results: 9/22 BCx: pending 9/22 UCx: pending  9/22 resp panel: negative  Thank you for allowing pharmacy to be a part of this patient's care.  Marja Kays 01/29/2023 3:34 AM

## 2023-01-29 NOTE — Progress Notes (Signed)
eLink Physician-Brief Progress Note Patient Name: [name] DOB: 01/31/1946 MRN: [id number]   Date of Service  01/29/2023  HPI/Events of Note  77 year old female with pertinent PMH T2DM, HTN, HLD, HFpEF, COPD presents to [facility] ED on 9/22 with septic shock.  She has been progressively more confused with leukocytosis, hypotension, elevated creatinine.  Initially admitted to hospitalist service but developed wide-complex tachycardia with rates in the 200s that converted back to sinus tachycardia spontaneously.  Patient is febrile.  Imaging consistent with pyelonephritis and the patient was started on pressors.  Tachycardic, tachypneic, hypotensive requiring norepinephrine and phenylephrine infusion.  Laboratories consistent with electrolyte disturbances, elevated creatinine and normocytic anemia.  Elevated lactic acid.  Blood cultures collected and pending.  CT abdomen personally reviewed.  eICU Interventions  Maintain norepinephrine for MAP goal greater than 65 with additional crystalloid.  Maintain midodrine.  Aggressive electrolyte repletion.  Continue with LR bolus, add Calcium bolus.   Will request ground team eval for potential central line.  Heparin for DVT prophylaxis. GI prophylaxis not indicated.     Intervention Category Evaluation Type: New Patient Evaluation  Serenna Deroy 01/29/2023, 5:58 AM

## 2023-01-29 NOTE — TOC CAGE-AID Note (Signed)
Transition of Care [facility]) - CAGE-AID Screening   Patient Details  Name: NADYA STOLDT MRN: [id number] Date of Birth: 1945/05/24  Transition of Care [facility]) CM/SW Contact:    Katha Hamming, RN Phone Number: 01/29/2023, 4:47 AM   Clinical Narrative:  Not appropriate for screening  CAGE-AID Screening: Substance Abuse Screening unable to be completed due to: : Patient unable to participate (unable to answer questions at this time, nonverbal d/t sepsis)

## 2023-01-29 NOTE — IPAL (Signed)
  Interdisciplinary Goals of Care Family Meeting   Date carried out: 01/29/2023  Location of the meeting: Phone conference  Member's involved: Family Member or next of kin and Other: PA-C  Durable Power of [employer] or acting medical decision maker: Husband Lorella Nimrod    Discussion: We discussed goals of care for [employer] .  Spoke w/ husband Lorella Nimrod and nephew over phone regarding Zhanna's worsening hypotension despite pressors and fluids. Given her acute decline recommended that we not escalate pressors and not do aggressive procedures such as central line placements and agree that she would not want that. Will continue supportive care. Family is on their way and should be here in about an hour to discuss goals moving forward and potential comfort care if Sahvanna continues to decline.  Code status:   Code Status: Limited: Do not attempt resuscitation (DNR) -DNR-LIMITED -Do Not Intubate/DNI    Disposition: Continue current acute care  Time spent for the meeting: 35 minutes    Lidia Collum, PA-C  01/29/2023, 6:48 AM

## 2023-01-29 NOTE — Evaluation (Signed)
Clinical/Bedside Swallow Evaluation Patient Details  Name: [name] MRN: [id number] Date of Birth: [birth year]  Today's Date: 01/29/2023 Time: SLP Start Time (ACUTE ONLY): 1221 SLP Stop Time (ACUTE ONLY): 1240 SLP Time Calculation (min) (ACUTE ONLY): 19 min  Past Medical History:  Past Medical History:  Diagnosis Date   Acute pain of left knee    Angina pectoris (HCC) 07/28/2016   With normal coronary arteriography   Carotid stenosis, right 11/18/2018   Cerebral infarction due to stenosis of right carotid artery (HCC) s/p tPA 11/15/2018   CHF (congestive heart failure) (HCC)    Chronic diastolic congestive heart failure (HCC) 07/28/2016   Chronic venous insufficiency 09/27/2021   COPD (chronic obstructive pulmonary disease) (HCC) 01/14/2018   Diabetes mellitus type II, uncontrolled 11/18/2018   Diabetes mellitus without complication (HCC)    Diabetic polyneuropathy associated with type 2 diabetes mellitus (HCC) 11/30/2015   Fall 11/21/2018   Family hx-stroke 11/18/2018   Hyperlipidemia    Hypertension    Hypertensive heart disease with heart failure (HCC) 07/28/2016   Living in nursing home    Lumbar disc narrowing    Onychogryposis of toenail 09/07/2020   Osteoporosis    Overgrown toenails 12/16/2020   Pustules determined by examination 04/11/2018   Spondylosis    lumbosacral   Stroke [facility])    Past Surgical History:  Past Surgical History:  Procedure Laterality Date   ABDOMINAL HYSTERECTOMY     APPENDECTOMY     CARDIAC CATHETERIZATION     CHOLECYSTECTOMY     ENDARTERECTOMY Right 11/22/2018   Procedure: ENDARTERECTOMY CAROTID RIGHT;  Surgeon: Cephus Shelling, MD;  Location: [facility] OR;  Service: Vascular;  Laterality: Right;   HIP SURGERY  07/16/2019   Had to repair a lot of damage   KNEE ARTHROCENTESIS  11/22/2018       LOOP RECORDER INSERTION N/A 11/08/2022   Procedure: LOOP RECORDER INSERTION;  Surgeon: Regan Lemming, MD;  Location: [facility] INVASIVE CV LAB;  Service:  Cardiovascular;  Laterality: N/A;   PATCH ANGIOPLASTY Right 11/22/2018   Procedure: Patch Angioplasty Right Carotid Artery using Xenosure Biologic Patch;  Surgeon: Cephus Shelling, MD;  Location: [facility] OR;  Service: Vascular;  Laterality: Right;   HPI:  [name] is a 78 yo female presenting to ED 9/22 with three day history of AMS. CTH unremarkable. AMS thought to be secondary to sepsis. Recently admitted for syncopal episode resulting in rib fx and seen by SLP at that time with prolonged mastication and was recommended a regular diet with thin liquids. PMH includes T2DM, HTN, HLD, RLS, HFpEF, COPD    Assessment / Plan / Recommendation  Clinical Impression  Pt lethargic upon SLP arrival, rousing to touch, but unable to sustain alertness for >30 seconds. Pt intermittently able to follow commands to complete an oral motor exam, although appears grossly WFL. Per pt's nephew, she was being seen by SLP at SNF following prior CVA and using compensatory strategies. RN reports pt previously taking meds with purees. Observed pt with limited trials of ice chips with one instance of a delayed throat clear. Pt required tactile cueing to remain alert enough throughout entirety of oral transit , often appearing to fall asleep with POs remaining in her oral cavity. Overall, she does not currently demonstrate a level of alertness adequate for PO intake and should remain NPO. She may benefit from alternative means of nutrition if aligned with GOC until she is able to remain adequately alert. Will continue to  follow to assess readiness to upgrade diet as she is able to remain alert for more sustained amounts of time. SLP Visit Diagnosis: Dysphagia, unspecified (R13.10)    Aspiration Risk  Mild aspiration risk    Diet Recommendation NPO    Medication Administration: Via alternative means    Other  Recommendations Oral Care Recommendations: Oral care QID;Staff/trained caregiver to provide oral care     Recommendations for follow up therapy are one component of a multi-disciplinary discharge planning process, led by the attending physician.  Recommendations may be updated based on patient status, additional functional criteria and insurance authorization.  Follow up Recommendations Skilled nursing-short term rehab (<3 hours/day)      Assistance Recommended at Discharge    Functional Status Assessment Patient has had a recent decline in their functional status and demonstrates the ability to make significant improvements in function in a reasonable and predictable amount of time.  Frequency and Duration min 2x/week  2 weeks       Prognosis Prognosis for improved oropharyngeal function: Good Barriers to Reach Goals: Cognitive deficits;Time post onset      Swallow Study   General HPI: [name] is a 77 yo female presenting to ED 9/22 with three day history of AMS. CTH unremarkable. AMS thought to be secondary to sepsis. Recently admitted for syncopal episode resulting in rib fx and seen by SLP at that time with prolonged mastication and was recommended a regular diet with thin liquids. PMH includes T2DM, HTN, HLD, RLS, HFpEF, COPD Type of Study: Bedside Swallow Evaluation Previous Swallow Assessment: see HPI Diet Prior to this Study: NPO Temperature Spikes Noted: No Respiratory Status: Nasal cannula History of Recent Intubation: No Behavior/Cognition: Lethargic/Drowsy;Requires cueing Oral Cavity Assessment: Within Functional Limits Oral Care Completed by SLP: No Oral Cavity - Dentition: Edentulous Self-Feeding Abilities: Total assist Patient Positioning: Upright in bed Baseline Vocal Quality: Not observed Volitional Cough: Cognitively unable to elicit Volitional Swallow: Unable to elicit    Oral/Motor/Sensory Function Overall Oral Motor/Sensory Function: Within functional limits   Ice Chips Ice chips: Impaired Presentation: Spoon Pharyngeal Phase Impairments: Throat  Clearing - Immediate   Thin Liquid Thin Liquid: Not tested    Nectar Thick Nectar Thick Liquid: Not tested   Honey Thick Honey Thick Liquid: Not tested   Puree Puree: Not tested   Solid     Solid: Not tested      Gwynneth Aliment, M.A., CF-SLP Speech Language Pathology, Acute Rehabilitation Services  Secure Chat preferred [phone]  01/29/2023,12:55 PM

## 2023-01-29 NOTE — Progress Notes (Signed)
Pt arrived on unit around 0600. Pt was hypotensive on 15 on levo, placed pt in trendelenburg position. Contacted e-link. New orders provided and carried out. Suzie Portela, MD arrived to assess pt. New order given and carried out. Critical lactic acid result pended. Notified e-link, no new orders ar this time. Pt is now stable and will continue to monitor and assess the pt and notify of any changes.

## 2023-01-29 NOTE — ED Notes (Signed)
ED TO INPATIENT HANDOFF REPORT  ED Nurse Name and Phone #: Angelique Holm RN [phone]  S Name/Age/Gender [name] 77 y.o. female Room/Bed: 007C/007C  Code Status   Code Status: Do not attempt resuscitation (DNR) - Comfort care  Home/SNF/Other Skilled nursing facility, rehab Patient oriented to: self, place, time, and situation Is this baseline? Yes   Triage Complete: Triage complete  Chief Complaint Acute pyelonephritis [N10]  Triage Note Patient arrives by EMS from [facility] for possible UTI.  Staff reported patient had a fever of 100.8 today, and was given tylenol, also reports patient altered.  On arrival patient answers questions appropriately, but is delayed.  Patient reports having recent stroke.   Reports difficulty swallowing, states threw up last night.   CXR done at facility.   Patient reports they were unable to get urine sample.     EMS VITALS: 93/47 HR 81 96% RA 146 CBG  Patient given 200 ml of normal saline PTA.     Allergies Allergies  Allergen Reactions   Alendronate Other (See Comments)    Muscle weakness  **Fosamax**   Valsartan Nausea And Vomiting    Level of Care/Admitting Diagnosis ED Disposition     ED Disposition  Admit   Condition  --   Comment  Hospital Area: [facility] [[id number]]  Level of Care: Med-Surg [16]  May admit patient to [facility] or [facility] if equivalent level of care is available:: Yes  Covid Evaluation: Confirmed COVID Negative  Diagnosis: Acute pyelonephritis [[id number]]  Admitting Physician: Gery Pray [[id number]]  Attending Physician: Gery Pray [[id number]]  Certification:: I certify this patient will need inpatient services for at least 2 midnights  Expected Medical Readiness: 01/30/2023          B Medical/Surgery History Past Medical History:  Diagnosis Date   Acute pain of left knee    Angina pectoris (HCC) 07/28/2016   With normal coronary arteriography   Carotid stenosis,  right 11/18/2018   Cerebral infarction due to stenosis of right carotid artery (HCC) s/p tPA 11/15/2018   CHF (congestive heart failure) (HCC)    Chronic diastolic congestive heart failure (HCC) 07/28/2016   Chronic venous insufficiency 09/27/2021   COPD (chronic obstructive pulmonary disease) (HCC) 01/14/2018   Diabetes mellitus type II, uncontrolled 11/18/2018   Diabetes mellitus without complication (HCC)    Diabetic polyneuropathy associated with type 2 diabetes mellitus (HCC) 11/30/2015   Fall 11/21/2018   Family hx-stroke 11/18/2018   Hyperlipidemia    Hypertension    Hypertensive heart disease with heart failure (HCC) 07/28/2016   Living in nursing home    Lumbar disc narrowing    Onychogryposis of toenail 09/07/2020   Osteoporosis    Overgrown toenails 12/16/2020   Pustules determined by examination 04/11/2018   Spondylosis    lumbosacral   Stroke [facility])    Past Surgical History:  Procedure Laterality Date   ABDOMINAL HYSTERECTOMY     APPENDECTOMY     CARDIAC CATHETERIZATION     CHOLECYSTECTOMY     ENDARTERECTOMY Right 11/22/2018   Procedure: ENDARTERECTOMY CAROTID RIGHT;  Surgeon: Cephus Shelling, MD;  Location: [facility] OR;  Service: Vascular;  Laterality: Right;   HIP SURGERY  07/16/2019   Had to repair a lot of damage   KNEE ARTHROCENTESIS  11/22/2018       LOOP RECORDER INSERTION N/A 11/08/2022   Procedure: LOOP RECORDER INSERTION;  Surgeon: Regan Lemming, MD;  Location: [facility] INVASIVE CV LAB;  Service: Cardiovascular;  Laterality: N/A;   PATCH ANGIOPLASTY Right 11/22/2018   Procedure: Patch Angioplasty Right Carotid Artery using Xenosure Biologic Patch;  Surgeon: Cephus Shelling, MD;  Location: [facility] OR;  Service: Vascular;  Laterality: Right;     A IV Location/Drains/Wounds Patient Lines/Drains/Airways Status     Active Line/Drains/Airways     Name Placement date Placement time Site Days   Peripheral IV 01/28/23 20 G Left Antecubital 01/28/23  --  Antecubital  1    Peripheral IV 01/28/23 20 G Right Antecubital 01/28/23  1909  Antecubital  1            Intake/Output Last 24 hours  Intake/Output Summary (Last 24 hours) at 01/29/2023 0204 Last data filed at 01/28/2023 2333 Gross per 24 hour  Intake 1600 ml  Output --  Net 1600 ml    Labs/Imaging Results for orders placed or performed during the hospital encounter of 01/28/23 (from the past 48 hour(s))  CBC with Differential     Status: Abnormal   Collection Time: 01/28/23  7:02 PM  Result Value Ref Range   WBC 15.7 (H) 4.0 - 10.5 K/uL   RBC 4.04 3.87 - 5.11 MIL/uL   Hemoglobin 11.7 (L) 12.0 - 15.0 g/dL   HCT 16.1 09.6 - 04.5 %   MCV 89.1 80.0 - 100.0 fL   MCH 29.0 26.0 - 34.0 pg   MCHC 32.5 30.0 - 36.0 g/dL   RDW 40.9 81.1 - 91.4 %   Platelets 213 150 - 400 K/uL   nRBC 0.0 0.0 - 0.2 %   Neutrophils Relative % 80 %   Neutro Abs 12.5 (H) 1.7 - 7.7 K/uL   Lymphocytes Relative 9 %   Lymphs Abs 1.4 0.7 - 4.0 K/uL   Monocytes Relative 11 %   Monocytes Absolute 1.7 (H) 0.1 - 1.0 K/uL   Eosinophils Relative 0 %   Eosinophils Absolute 0.0 0.0 - 0.5 K/uL   Basophils Relative 0 %   Basophils Absolute 0.1 0.0 - 0.1 K/uL   Immature Granulocytes 0 %   Abs Immature Granulocytes 0.07 0.00 - 0.07 K/uL    Comment: Performed at [facility], [address], [place], [place] [address]  Comprehensive metabolic panel     Status: Abnormal   Collection Time: 01/28/23  7:02 PM  Result Value Ref Range   Sodium 135 135 - 145 mmol/L   Potassium 3.5 3.5 - 5.1 mmol/L   Chloride 91 (L) 98 - 111 mmol/L   CO2 31 22 - 32 mmol/L   Glucose, Bld 99 70 - 99 mg/dL    Comment: Glucose reference range applies only to samples taken after fasting for at least 8 hours.   BUN 39 (H) 8 - 23 mg/dL   Creatinine, Ser 6.21 (H) 0.44 - 1.00 mg/dL   Calcium 8.3 (L) 8.9 - 10.3 mg/dL   Total Protein 6.8 6.5 - 8.1 g/dL   Albumin 2.7 (L) 3.5 - 5.0 g/dL   AST 38 15 - 41 U/L   ALT 31 0 - 44 U/L   Alkaline Phosphatase 76 38  - 126 U/L   Total Bilirubin 1.3 (H) 0.3 - 1.2 mg/dL   GFR, Estimated 32 (L) >60 mL/min    Comment: (NOTE) Calculated using the CKD-EPI Creatinine Equation (2021)    Anion gap 13 5 - 15    Comment: Performed at [facility], [address] [place], [place] [address]  Lipase, blood  Status: None   Collection Time: 01/28/23  7:02 PM  Result Value Ref Range   Lipase 22 11 - 51 U/L    Comment: Performed at [facility], [address], [place], [place] [address]  Resp panel by RT-PCR (RSV, Flu A&B, Covid) Anterior Nasal Swab     Status: None   Collection Time: 01/28/23  7:03 PM   Specimen: Anterior Nasal Swab  Result Value Ref Range   SARS Coronavirus 2 by RT PCR NEGATIVE NEGATIVE   Influenza A by PCR NEGATIVE NEGATIVE   Influenza B by PCR NEGATIVE NEGATIVE    Comment: (NOTE) The Xpert Xpress SARS-CoV-2/FLU/RSV plus assay is intended as an aid in the diagnosis of influenza from Nasopharyngeal swab specimens and should not be used as a sole basis for treatment. Nasal washings and aspirates are unacceptable for Xpert Xpress SARS-CoV-2/FLU/RSV testing.  Fact Sheet for Patients: BloggerCourse.com  Fact Sheet for Healthcare Providers: SeriousBroker.it  This test is not yet approved or cleared by the [place] FDA and has been authorized for detection and/or diagnosis of SARS-CoV-2 by FDA under an Emergency Use Authorization (EUA). This EUA will remain in effect (meaning this test can be used) for the duration of the COVID-19 declaration under Section 564(b)(1) of the Act, 21 U.S.C. section 360bbb-3(b)(1), unless the authorization is terminated or revoked.     Resp Syncytial Virus by PCR NEGATIVE NEGATIVE    Comment: (NOTE) Fact Sheet for Patients: BloggerCourse.com  Fact Sheet for Healthcare Providers: SeriousBroker.it  This test is not yet approved or cleared  by the [place] FDA and has been authorized for detection and/or diagnosis of SARS-CoV-2 by FDA under an Emergency Use Authorization (EUA). This EUA will remain in effect (meaning this test can be used) for the duration of the COVID-19 declaration under Section 564(b)(1) of the Act, 21 U.S.C. section 360bbb-3(b)(1), unless the authorization is terminated or revoked.  Performed at [facility], [address], [place], [place] [address]   I-Stat Lactic Acid     Status: None   Collection Time: 01/28/23  7:22 PM  Result Value Ref Range   Lactic Acid, Venous 1.7 0.5 - 1.9 mmol/L  Urinalysis, w/ Reflex to Culture (Infection Suspected) -Urine, Clean Catch     Status: Abnormal   Collection Time: 01/28/23  9:48 PM  Result Value Ref Range   Specimen Source URINE, CLEAN CATCH    Color, Urine YELLOW YELLOW   APPearance TURBID (A) CLEAR   Specific Gravity, Urine 1.015 1.005 - 1.030   pH 5.0 5.0 - 8.0   Glucose, UA NEGATIVE NEGATIVE mg/dL   Hgb urine dipstick MODERATE (A) NEGATIVE   Bilirubin Urine NEGATIVE NEGATIVE   Ketones, ur NEGATIVE NEGATIVE mg/dL   Protein, ur 30 (A) NEGATIVE mg/dL   Nitrite POSITIVE (A) NEGATIVE   Leukocytes,Ua LARGE (A) NEGATIVE   RBC / HPF 21-50 0 - 5 RBC/hpf   WBC, UA >50 0 - 5 WBC/hpf    Comment:        Reflex urine culture not performed if WBC <=10, OR if Squamous epithelial cells >5. If Squamous epithelial cells >5 suggest recollection.    Bacteria, UA MANY (A) NONE SEEN   Squamous Epithelial / HPF 11-20 0 - 5 /HPF    Comment: Performed at [facility], [address] [place], [place] [address]  CBG monitoring, ED     Status: None   Collection Time: 01/29/23 12:00 AM  Result Value Ref Range  Glucose-Capillary 90 70 - 99 mg/dL    Comment: Glucose reference range applies only to samples taken after fasting for at least 8 hours.  CBC with Differential/Platelet     Status: Abnormal   Collection Time: 01/29/23  1:46 AM  Result Value Ref  Range   WBC 2.8 (L) 4.0 - 10.5 K/uL   RBC 4.11 3.87 - 5.11 MIL/uL   Hemoglobin 11.8 (L) 12.0 - 15.0 g/dL   HCT 16.1 09.6 - 04.5 %   MCV 88.1 80.0 - 100.0 fL   MCH 28.7 26.0 - 34.0 pg   MCHC 32.6 30.0 - 36.0 g/dL   RDW 40.9 81.1 - 91.4 %   Platelets 149 (L) 150 - 400 K/uL   nRBC 0.0 0.0 - 0.2 %   Neutrophils Relative % 83 %   Neutro Abs 2.3 1.7 - 7.7 K/uL   Lymphocytes Relative 11 %   Lymphs Abs 0.3 (L) 0.7 - 4.0 K/uL   Monocytes Relative 2 %   Monocytes Absolute 0.1 0.1 - 1.0 K/uL   Eosinophils Relative 0 %   Eosinophils Absolute 0.0 0.0 - 0.5 K/uL   Basophils Relative 0 %   Basophils Absolute 0.0 0.0 - 0.1 K/uL   Immature Granulocytes 4 %   Abs Immature Granulocytes 0.11 (H) 0.00 - 0.07 K/uL    Comment: Performed at [facility], [address] [place], [place] [address]   CT ABDOMEN PELVIS W CONTRAST  Result Date: 01/28/2023 CLINICAL DATA:  Abdominal pain, acute, nonlocalized. Low-grade fever on arrival of the emergency services. EXAM: CT ABDOMEN AND PELVIS WITH CONTRAST TECHNIQUE: Multidetector CT imaging of the abdomen and pelvis was performed using the standard protocol following bolus administration of intravenous contrast. RADIATION DOSE REDUCTION: This exam was performed according to the departmental dose-optimization program which includes automated exposure control, adjustment of the mA and/or kV according to patient size and/or use of iterative reconstruction technique. CONTRAST:  60mL OMNIPAQUE IOHEXOL 350 MG/ML SOLN COMPARISON:  CTs with IV contrast 06/23/2019 and 03/23/2019. FINDINGS: Lower chest: There is mild lower lobe bronchial thickening. Posterior atelectasis in the lower lobes without lung base infiltrates. Loop recorder device left chest wall. The heart is slightly enlarged. There is no pericardial effusion. The coronary arteries and mitral ring are heavily calcified. Small hiatal hernia. Hepatobiliary: No focal liver abnormality is seen. Status post  cholecystectomy. No biliary dilatation. Liver appears mildly steatotic. Pancreas: No abnormality. Spleen: There is a new 1.9 cm subcapsular linear hypodensity in the posterolateral midportion of the spleen on 3:21. Grossly this has the appearance of a grade 2 laceration, but since there is no perisplenic or subcapsular hematoma or evidence of a free hemorrhage, this is most likely a late-subacute or otherwise contained injury. Rest of the spleen enhanced homogeneously.  No splenomegaly. Adrenals/Urinary Tract: There is no adrenal mass. The kidneys enhanced fairly homogeneously in the initial images, but there are areas of faint hypoenhancement in both kidneys in the delayed phase concerning for pyelonephritis. Furthermore there is mild urothelial thickening in the renal pelves and proximal ureters suggesting further evidence of an ascending UTI. The bladder however, is not thickened. Stomach/Bowel: No bowel obstruction or inflammation is seen. An appendix is not seen in this patient. There is moderate fecal stasis. Scattered diverticulosis without evidence of colitis or diverticulitis. Vascular/Lymphatic: Atherosclerosis noted aortoiliac systems in visceral branch arteries, with again noted small celiac trunk with collateralization through the pancreaticoduodenal branch of the SMA unchanged. No enlarged lymph nodes. Reproductive: Status post  hysterectomy. No adnexal masses. Other: Mild rectus diastasis at and above the umbilicus and a tiny umbilical fat hernia. No incarcerated hernia. Calcified right mid abdominal lymph node cluster again noted. Trace deep pelvic ascites posteriorly with no free hemorrhage, free air or localizing collections. Musculoskeletal: Osteopenia and degenerative change thoracic and lumbar spine, disproportionate advanced facet hypertrophy L4-5 with acquired spinal stenosis and chronic grade 1 anterolisthesis. There is advanced arthrosis at the left hip with interval right hip replacement.  One of the securing screws for the acetabular component extends posteriorly into the right gluteus medius muscle. Mild levoscoliosis. There are subacute partially healed displaced fractures of the right posterolateral seventh and eighth ribs. No acute or other significant osseous findings. IMPRESSION: 1. Faint areas of hypoenhancement in both kidneys in the delayed phase concerning for pyelonephritis. There is also mild urothelial thickening in the renal pelves and proximal ureters suggesting an ascending UTI, but there is no bladder thickening. 2. 1.9 cm subcapsular linear hypodensity in the posterolateral midportion of the spleen. Grossly this has the appearance of a grade 2 laceration, but since there is no perisplenic or subcapsular hematoma or evidence of a free hemorrhage, this is most likely a late-subacute or otherwise contained injury. 3. Subacute partially healed displaced fractures of the right posterolateral seventh and eighth ribs. 4. Cardiomegaly with calcific CAD and heavily calcified mitral ring. 5. Mild hepatic steatosis. 6. Constipation and diverticulosis. 7. Aortic and branch vessel atherosclerosis. 8. Interval right hip replacement. One of the securing screws for the acetabular component extends posteriorly into the right gluteus medius muscle. 9. Osteopenia and degenerative change. Aortic Atherosclerosis (ICD10-I70.0). Electronically Signed   By: Almira Bar M.D.   On: 01/28/2023 21:46   CT Head Wo Contrast  Result Date: 01/28/2023 CLINICAL DATA:  Altered mental status EXAM: CT HEAD WITHOUT CONTRAST TECHNIQUE: Contiguous axial images were obtained from the base of the skull through the vertex without intravenous contrast. RADIATION DOSE REDUCTION: This exam was performed according to the departmental dose-optimization program which includes automated exposure control, adjustment of the mA and/or kV according to patient size and/or use of iterative reconstruction technique. COMPARISON:   01/13/2023 FINDINGS: Brain: No evidence of acute infarction, hemorrhage, mass, mass effect, or midline shift. No hydrocephalus or extra-axial fluid collection. Left greater than right basal ganglia calcifications. Remote cortical infarcts in the right parietal and left occipital lobe. Remote lacunar infarcts in the right basal ganglia and left thalamus. Periventricular white matter changes, likely the sequela of chronic small vessel ischemic disease. Vascular: No hyperdense vessel. Atherosclerotic calcifications in the intracranial carotid and vertebral arteries. Skull: Negative for fracture or focal lesion. Sinuses/Orbits: Mucosal thickening in the ethmoid air cells. No acute finding in the orbits. Other: The mastoid air cells are well aerated. IMPRESSION: No acute intracranial process. Electronically Signed   By: Wiliam Ke M.D.   On: 01/28/2023 21:14   DG CHEST PORT 1 VIEW  Result Date: 01/28/2023 CLINICAL DATA:  Fever EXAM: PORTABLE CHEST 1 VIEW COMPARISON:  01/13/2023 FINDINGS: The heart size and mediastinal contours are within normal limits. Aortic atherosclerosis. Persistent area pleural thickening at the periphery of the right lung base. Dense calcification of the mitral annulus. No focal airspace consolidation, pleural effusion, or pneumothorax. The visualized skeletal structures are unremarkable. IMPRESSION: 1. No active cardiopulmonary disease. 2. Persistent area of pleural thickening at the periphery of the right lung base. Electronically Signed   By: Duanne Guess D.O.   On: 01/28/2023 19:22    Pending Labs Unresulted  Labs (From admission, onward)     Start     Ordered   01/29/23 0500  Basic metabolic panel  Tomorrow morning,   R        01/28/23 2231   01/29/23 0500  Magnesium  Tomorrow morning,   R        01/28/23 2231   01/28/23 1851  Culture, blood (routine x 2)  BLOOD CULTURE X 2,   R (with STAT occurrences)      01/28/23 1850   Unscheduled  Occult blood card to lab, stool  As  needed,   R      01/28/23 2332            Vitals/Pain Today's Vitals   01/29/23 0115 01/29/23 0120 01/29/23 0129 01/29/23 0130  BP: 121/60 132/67  (!) 112/55  Pulse: (!) 120 (!) 118 (!) 115 (!) 115  Resp: (!) 30 (!) 0 (!) 26 (!) 27  Temp:      TempSrc:      SpO2: 97% 98% 97% 98%  PainSc:        Isolation Precautions No active isolations  Medications Medications  heparin injection 5,000 Units (5,000 Units Subcutaneous Given 01/29/23 0013)  acetaminophen (TYLENOL) tablet 650 mg (has no administration in time range)    Or  acetaminophen (TYLENOL) suppository 650 mg (has no administration in time range)  senna-docusate (Senokot-S) tablet 1 tablet (has no administration in time range)  ondansetron (ZOFRAN) tablet 4 mg (has no administration in time range)    Or  ondansetron (ZOFRAN) injection 4 mg (has no administration in time range)  cefTRIAXone (ROCEPHIN) 1 g in sodium chloride 0.9 % 100 mL IVPB (has no administration in time range)  insulin aspart (novoLOG) injection 0-15 Units (has no administration in time range)  insulin aspart (novoLOG) injection 0-5 Units (0 Units Subcutaneous Not Given 01/29/23 0006)  lactated ringers infusion ( Intravenous New Bag/Given 01/29/23 0012)  ezetimibe (ZETIA) tablet 10 mg (has no administration in time range)  polyethylene glycol (MIRALAX / GLYCOLAX) packet 17 g (has no administration in time range)  rOPINIRole (REQUIP) tablet 1 mg (1 mg Oral Given 01/29/23 0013)  clopidogrel (PLAVIX) tablet 75 mg (has no administration in time range)  atorvastatin (LIPITOR) tablet 80 mg (80 mg Oral Given 01/29/23 0013)  aspirin chewable tablet 81 mg (has no administration in time range)  lactated ringers bolus 1,000 mL (0 mLs Intravenous Stopped 01/28/23 2107)  iohexol (OMNIPAQUE) 350 MG/ML injection 60 mL (60 mLs Intravenous Contrast Given 01/28/23 2049)  lactated ringers bolus 500 mL (0 mLs Intravenous Stopped 01/28/23 2333)  cefTRIAXone (ROCEPHIN) 1 g in  sodium chloride 0.9 % 100 mL IVPB (0 g Intravenous Stopped 01/28/23 2305)    Mobility non-ambulatory     Focused Assessments Hx of multiple strokes.     R Recommendations: See Admitting Provider Note  Report given to:   Additional Notes: Patient noted to have episode of SVT.  ED MD to bedside.  Patient converted without interventions.  Admit MD made aware.

## 2023-01-29 NOTE — Progress Notes (Addendum)
Received a call from bedside RN regarding the patient having wide complex tachycardia with heart rate in the 220s to 240s lasting a few minutes then converting to sinus tachycardia spontaneously.  Presented at bedside.  The patient is lethargic.  She minimally arouses to voices then quickly drifts back to sleep.    While in the room, the patient became acutely hypotensive with MAP in the low 50s.  The patient has a history of chronic diastolic CHF, currently on IV fluid maintenance LR at 75 cc/h, home torsemide 50 mg twice daily was held on admission.    Ordered p.o. vasopressor, midodrine, however the patient was too somnolent to swallow safely, therefore did not receive it.  Initiated IV vasopressor, phenylephrine, instead of Levophed due to hypotension and persistent tachycardia in the setting of sepsis.    Febrile at bedside with Tmax 102.9.  Additionally received IV fluid bolus 250 cc x 1.  Not aggressively volume resuscitated due to CHF and trace peripheral edema noted on exam.  IV antibiotics broadened to Merrem and IV vancomycin (History of positive MRSA screening test).  Patient incidentally found to have splenic laceration, seen on CT scan.  General surgery was consulted.  General surgery, Dr. Dossie Der, presented at bedside, felt current presentation is less likely contributed by her splenic injury.  Acute pancytopenia noted on repeat CBC.  Urine culture and peripheral blood cultures x 2 are in process.  Due to septic shock likely 2/2 to B/L acute pyelonephritis, critical care medicine was consulted, Dr. Gaynell Face, will see the patient in consultation.  Critical care time : 40 minutes

## 2023-01-29 NOTE — Progress Notes (Signed)
PHARMACY - PHYSICIAN COMMUNICATION CRITICAL VALUE ALERT - BLOOD CULTURE IDENTIFICATION (BCID)  [name] is an 77 y.o. female who presented to [facility] on 01/28/2023 with a chief complaint of altered mental status.   Assessment:  77 year old female who is a resident of [facility] admitted with altered mental status. Concern for pyelonephritis. Now with ESBL E coli in 1/2 blood cultures.   Name of physician (or Provider) Contacted: 4N PharmD/Chand   Current antibiotics: Meropenem   Changes to prescribed antibiotics recommended:  Patient is on recommended antibiotics - No changes needed  Results for orders placed or performed during the hospital encounter of 01/28/23  Blood Culture ID Panel (Reflexed) (Collected: 01/28/2023  8:19 PM)  Result Value Ref Range   Enterococcus faecalis NOT DETECTED NOT DETECTED   Enterococcus Faecium NOT DETECTED NOT DETECTED   Listeria monocytogenes NOT DETECTED NOT DETECTED   Staphylococcus species NOT DETECTED NOT DETECTED   Staphylococcus aureus (BCID) NOT DETECTED NOT DETECTED   Staphylococcus epidermidis NOT DETECTED NOT DETECTED   Staphylococcus lugdunensis NOT DETECTED NOT DETECTED   Streptococcus species NOT DETECTED NOT DETECTED   Streptococcus agalactiae NOT DETECTED NOT DETECTED   Streptococcus pneumoniae NOT DETECTED NOT DETECTED   Streptococcus pyogenes NOT DETECTED NOT DETECTED   A.calcoaceticus-baumannii NOT DETECTED NOT DETECTED   Bacteroides fragilis NOT DETECTED NOT DETECTED   Enterobacterales DETECTED (A) NOT DETECTED   Enterobacter cloacae complex NOT DETECTED NOT DETECTED   Escherichia coli DETECTED (A) NOT DETECTED   Klebsiella aerogenes NOT DETECTED NOT DETECTED   Klebsiella oxytoca NOT DETECTED NOT DETECTED   Klebsiella pneumoniae NOT DETECTED NOT DETECTED   Proteus species NOT DETECTED NOT DETECTED   Salmonella species NOT DETECTED NOT DETECTED   Serratia marcescens NOT DETECTED NOT DETECTED   Haemophilus influenzae NOT  DETECTED NOT DETECTED   Neisseria meningitidis NOT DETECTED NOT DETECTED   Pseudomonas aeruginosa NOT DETECTED NOT DETECTED   Stenotrophomonas maltophilia NOT DETECTED NOT DETECTED   Candida albicans NOT DETECTED NOT DETECTED   Candida auris NOT DETECTED NOT DETECTED   Candida glabrata NOT DETECTED NOT DETECTED   Candida krusei NOT DETECTED NOT DETECTED   Candida parapsilosis NOT DETECTED NOT DETECTED   Candida tropicalis NOT DETECTED NOT DETECTED   Cryptococcus neoformans/gattii NOT DETECTED NOT DETECTED   CTX-M ESBL DETECTED (A) NOT DETECTED   Carbapenem resistance IMP NOT DETECTED NOT DETECTED   Carbapenem resistance KPC NOT DETECTED NOT DETECTED   Carbapenem resistance NDM NOT DETECTED NOT DETECTED   Carbapenem resist OXA 48 LIKE NOT DETECTED NOT DETECTED   Carbapenem resistance VIM NOT DETECTED NOT DETECTED    Sharin Mons, PharmD, BCPS, BCIDP Infectious Diseases Clinical Pharmacist Phone: [phone] 01/29/2023  11:34 AM

## 2023-01-29 NOTE — ED Notes (Signed)
Patient noted to have increased HR in the 200s.  ED MD to bedside.  Patient placed on pads.  Without interventions patient noted to convert back to sinus tach.    Admit MD paged and made aware of changes in vital signs.

## 2023-01-30 ENCOUNTER — Inpatient Hospital Stay (HOSPITAL_COMMUNITY): Payer: Medicare Other

## 2023-01-30 DIAGNOSIS — A419 Sepsis, unspecified organism: Principal | ICD-10-CM

## 2023-01-30 DIAGNOSIS — N10 Acute tubulo-interstitial nephritis: Secondary | ICD-10-CM | POA: Diagnosis not present

## 2023-01-30 DIAGNOSIS — R6521 Severe sepsis with septic shock: Secondary | ICD-10-CM | POA: Diagnosis not present

## 2023-01-30 LAB — CBC
HCT: 33.2 % — ABNORMAL LOW (ref 36.0–46.0)
Hemoglobin: 10.7 g/dL — ABNORMAL LOW (ref 12.0–15.0)
MCH: 28.5 pg (ref 26.0–34.0)
MCHC: 32.2 g/dL (ref 30.0–36.0)
MCV: 88.5 fL (ref 80.0–100.0)
Platelets: 190 10*3/uL (ref 150–400)
RBC: 3.75 MIL/uL — ABNORMAL LOW (ref 3.87–5.11)
RDW: 15.6 % — ABNORMAL HIGH (ref 11.5–15.5)
WBC: 30.4 10*3/uL — ABNORMAL HIGH (ref 4.0–10.5)
nRBC: 0 % (ref 0.0–0.2)

## 2023-01-30 LAB — COMPREHENSIVE METABOLIC PANEL
ALT: 35 U/L (ref 0–44)
AST: 46 U/L — ABNORMAL HIGH (ref 15–41)
Albumin: 2.1 g/dL — ABNORMAL LOW (ref 3.5–5.0)
Alkaline Phosphatase: 95 U/L (ref 38–126)
Anion gap: 13 (ref 5–15)
BUN: 41 mg/dL — ABNORMAL HIGH (ref 8–23)
CO2: 24 mmol/L (ref 22–32)
Calcium: 7.9 mg/dL — ABNORMAL LOW (ref 8.9–10.3)
Chloride: 98 mmol/L (ref 98–111)
Creatinine, Ser: 1.46 mg/dL — ABNORMAL HIGH (ref 0.44–1.00)
GFR, Estimated: 37 mL/min — ABNORMAL LOW (ref >60)
Glucose, Bld: 126 mg/dL — ABNORMAL HIGH (ref 70–99)
Potassium: 3.9 mmol/L (ref 3.5–5.1)
Sodium: 135 mmol/L (ref 135–145)
Total Bilirubin: 0.8 mg/dL (ref 0.3–1.2)
Total Protein: 5.8 g/dL — ABNORMAL LOW (ref 6.5–8.1)

## 2023-01-30 LAB — GLUCOSE, CAPILLARY
Glucose-Capillary: 124 mg/dL — ABNORMAL HIGH (ref 70–99)
Glucose-Capillary: 138 mg/dL — ABNORMAL HIGH (ref 70–99)
Glucose-Capillary: 155 mg/dL — ABNORMAL HIGH (ref 70–99)
Glucose-Capillary: 184 mg/dL — ABNORMAL HIGH (ref 70–99)
Glucose-Capillary: 194 mg/dL — ABNORMAL HIGH (ref 70–99)
Glucose-Capillary: 151 mg/dL — ABNORMAL HIGH (ref 70–99)

## 2023-01-30 LAB — MAGNESIUM: Magnesium: 2.1 mg/dL (ref 1.7–2.4)

## 2023-01-30 LAB — PHOSPHORUS: Phosphorus: 3.8 mg/dL (ref 2.5–4.6)

## 2023-01-30 MED ORDER — MIDODRINE HCL 5 MG PO TABS
10.0000 mg | ORAL_TABLET | Freq: Three times a day (TID) | ORAL | Status: DC
Start: 2023-01-31 — End: 2023-01-30

## 2023-01-30 MED ORDER — MIDODRINE HCL 5 MG PO TABS
10.0000 mg | ORAL_TABLET | Freq: Three times a day (TID) | ORAL | Status: DC
  Administered 2023-01-30 – 2023-01-31 (×2): 10 mg via ORAL
  Filled 2023-01-30 (×2): qty 2

## 2023-01-30 NOTE — Consult Note (Signed)
NAME:  [name], MRN:  [id number], DOB:  Oct 14, 1945, LOS: 2 ADMISSION DATE:  01/28/2023, CONSULTATION DATE:  9/23 REFERRING MD:  Dr. Margo Aye, CHIEF COMPLAINT:  septic shock   History of Present Illness:  Patient is a 77 year old female with pertinent PMH T2DM, HTN, HLD, HFpEF, COPD presents to [facility] ED on 9/22 with septic shock.  Patient is a resident at [facility].  Patient has been increasingly more confused over the past 3 days.  On 9/22 came to [facility] ED for further workup.  On arrival BP soft 101/45.  Initially afebrile.  WBC 15.7.  Sepsis protocol initiated.  Cultures obtained, IV fluids given, started on vancomycin and Rocephin.  CXR with persistent area of pleural thickening at periphery of right lung base.  CT ABD/pelvis showing concern for pyelonephritis in both kidneys and mild urothelial thickening; 1.9 cm subscapular linear hypodensity on spleen appears to be grade 2 laceration likely late subacute; partially healed fractures of the right seventh and eighth ribs.  UA with many bacteria and positive nitrites.  Creatinine 1.67.  CT head no acute abnormality.  Patient admitted by [facility].  CCS consulted for splenic laceration.  Overnight patient noted to have wide-complex tachycardia with rates over 200s that would convert back to sinus tach ED spontaneously.  Patient more lethargic.  Patient more hypotensive with maps in 62s.  Fever now 102.9 F and antibiotics broadened to meropenem and vancomycin.  Despite gentle IV fluids patient remained hypotensive and required pressors.  PCCM consulted for ICU admission.  Pertinent  Medical History   Past Medical History:  Diagnosis Date   Acute pain of left knee    Angina pectoris (HCC) 07/28/2016   With normal coronary arteriography   Carotid stenosis, right 11/18/2018   Cerebral infarction due to stenosis of right carotid artery (HCC) s/p tPA 11/15/2018   CHF (congestive heart failure) (HCC)    Chronic diastolic congestive heart failure  (HCC) 07/28/2016   Chronic venous insufficiency 09/27/2021   COPD (chronic obstructive pulmonary disease) (HCC) 01/14/2018   Diabetes mellitus type II, uncontrolled 11/18/2018   Diabetes mellitus without complication (HCC)    Diabetic polyneuropathy associated with type 2 diabetes mellitus (HCC) 11/30/2015   Fall 11/21/2018   Family hx-stroke 11/18/2018   Hyperlipidemia    Hypertension    Hypertensive heart disease with heart failure (HCC) 07/28/2016   Living in nursing home    Lumbar disc narrowing    Onychogryposis of toenail 09/07/2020   Osteoporosis    Overgrown toenails 12/16/2020   Pustules determined by examination 04/11/2018   Spondylosis    lumbosacral   Stroke (HCC)      Significant Hospital Events: Including procedures, antibiotic start and stop dates in addition to other pertinent events   9/23 admitted w/ septic shock from uti w/ pyelo; now on pressors  Interim History / Subjective:  Became afebrile Blood culture came back positive for ESBL E. coli Remain on vasopressor support with Levophed  Objective   Blood pressure (!) 124/51, pulse 73, temperature 98.7 F (37.1 C), temperature source Oral, resp. rate (!) 23, weight 82.9 kg, SpO2 100%.        Intake/Output Summary (Last 24 hours) at 01/30/2023 0756 Last data filed at 01/30/2023 0700 Gross per 24 hour  Intake 4401.33 ml  Output 1735 ml  Net 2666.33 ml   Filed Weights   01/29/23 0322 01/30/23 0600  Weight: 73.5 kg 82.9 kg    Examination: General: Acute on chronically ill-appearing female,  lying on the bed HEENT: [place]/AT, eyes anicteric.  moist mucus membranes Neuro: Awake, interactive, following commands Chest: Coarse breath sounds, no wheezes or rhonchi Heart: Regular rate and rhythm, no murmurs or gallops Abdomen: Soft, nontender, nondistended, bowel sounds present Skin: No rash  Labs pending Blood culture grew ESBL E. coli  Resolved Hospital Problem list     Assessment & Plan:  Severe sepsis with  septic shock due to bilateral acute pyelonephritis, POA ESBL E. coli bacteremia Lactic acidosis Continue IV antibiotics with meropenem Continue vasopressor support with MAP goal 65 Continue IV fluid Follow-up culture and sensitivity results Remain afebrile Lactate has trended down  Acute septic encephalopathy Mental status has improved Avoid sedation  Wide-complex tachycardia likely due to electrolyte abnormalities Hypokalemia/hypophosphatemia/hypomagnesemia/hypocalcemia  Patient remained in sinus rhythm, no more episodes of arrhythmia Continue aggressive electrolyte replacement and monitor  AKI on CKD 3a Continue IV fluid Avoid nephrotoxic agent Monitor intake and output Trend BMP  Chronic HFpEF HLD Echo on 11/2022 lvef 65-70%; grade II diastolic dysfunction Monitor intake and output  Constipation Continue aggressive bowel regimen  T2DM w/ peripheral neuropathy with hyperglycemia Blood sugars are better controlled now Speech and swallow evaluation is pending Continue insulin with CBG goal 140-180 Hold gabapentin  COPD, not in exacerbation Continue as needed new DuoNeb  Subacute splenic laceration Subacute rib fractures status post fall Continue pain control PT/OT evaluation  Best Practice (right click and "Reselect all SmartList Selections" daily)   Diet/type: NPO DVT prophylaxis: prophylactic heparin  GI prophylaxis: N/A Lines: N/A Foley:  N/A Code Status:  DNR Last date of multidisciplinary goals of care discussion [9/23 updated husband over phone]  Labs   CBC: Recent Labs  Lab 01/28/23 1902 01/29/23 0146 01/29/23 0343 01/29/23 0356 01/29/23 0620 01/30/23 0514  WBC 15.7* 2.8* 8.9  --  22.5* 30.4*  NEUTROABS 12.5* 2.3  --   --   --   --   HGB 11.7* 11.8* 11.8* 10.2* 12.1 10.7*  HCT 36.0 36.2 37.4 30.0* 37.6 33.2*  MCV 89.1 88.1 89.7  --  92.4 88.5  PLT 213 149* 154  --  170 190    Basic Metabolic Panel: Recent Labs  Lab 01/28/23 1902  01/29/23 0146 01/29/23 0343 01/29/23 0356 01/29/23 1428  NA 135 135 136 137 131*  K 3.5 3.3* 3.1* 3.2* 4.5  CL 91* 94* 95*  --  94*  CO2 31 24 27   --  22  GLUCOSE 99 109* 100*  --  271*  BUN 39* 36* 38*  --  40*  CREATININE 1.67* 1.37* 1.49*  --  1.68*  CALCIUM 8.3* 7.9* 7.9*  --  7.8*  MG  --  1.5* 1.5*  --  2.0  PHOS  --   --  2.3*  --   --    GFR: Estimated Creatinine Clearance: 29.1 mL/min (A) (by C-G formula based on SCr of 1.68 mg/dL (H)). Recent Labs  Lab 01/28/23 1922 01/29/23 0146 01/29/23 0343 01/29/23 0403 01/29/23 0620 01/30/23 0514  WBC  --  2.8* 8.9  --  22.5* 30.4*  LATICACIDVEN 1.7  --   --  3.3* 2.5*  --     Liver Function Tests: Recent Labs  Lab 01/28/23 1902 01/29/23 0343  AST 38 48*  ALT 31 31  ALKPHOS 76 140*  BILITOT 1.3* 1.0  PROT 6.8 5.9*  ALBUMIN 2.7* 2.3*   Recent Labs  Lab 01/28/23 1902  LIPASE 22   Recent Labs  Lab 01/29/23 [phone]  AMMONIA 25    ABG    Component Value Date/Time   HCO3 29.9 (H) 01/29/2023 0601   TCO2 32 01/29/2023 0356   O2SAT 77.9 01/29/2023 0601     Coagulation Profile: No results for input(s): "INR", "PROTIME" in the last 168 hours.  Cardiac Enzymes: No results for input(s): "CKTOTAL", "CKMB", "CKMBINDEX", "TROPONINI" in the last 168 hours.  HbA1C: Hgb A1c MFr Bld  Date/Time Value Ref Range Status  11/08/2022 03:14 AM 8.2 (H) 4.8 - 5.6 % Final    Comment:    (NOTE) Pre diabetes:          5.7%-6.4%  Diabetes:              >6.4%  Glycemic control for   <7.0% adults with diabetes   11/16/2018 03:15 AM 10.4 (H) 4.8 - 5.6 % Final    Comment:    (NOTE)         Prediabetes: 5.7 - 6.4         Diabetes: >6.4         Glycemic control for adults with diabetes: <7.0     CBG: Recent Labs  Lab 01/29/23 1151 01/29/23 1605 01/29/23 1926 01/29/23 2314 01/30/23 0318  GLUCAP 179* 265* 238* 168* 124*    The patient is critically ill due to septic shock due to acute bilateral pyelonephritis and  ESBL E. coli bacteremia.  Critical care was necessary to treat or prevent imminent or life-threatening deterioration.  Critical care was time spent personally by me on the following activities: development of treatment plan with patient and/or surrogate as well as nursing, discussions with consultants, evaluation of patient's response to treatment, examination of patient, obtaining history from patient or surrogate, ordering and performing treatments and interventions, ordering and review of laboratory studies, ordering and review of radiographic studies, pulse oximetry, re-evaluation of patient's condition and participation in multidisciplinary rounds.   During this encounter critical care time was devoted to patient care services described in this note for 39 minutes.     Cheri Fowler, MD [facility] See Amion for pager If no response to pager, please call [phone] until 7pm After 7pm, Please call E-link [phone]

## 2023-01-30 NOTE — Progress Notes (Addendum)
Speech Language Pathology Treatment: Dysphagia  Patient Details Name: [name] MRN: [id number] DOB: [birth year] Today's Date: 01/30/2023 Time: 2595-6387 SLP Time Calculation (min) (ACUTE ONLY): 29 min  Assessment / Plan / Recommendation Clinical Impression  Pt much more awake and alert this date, engaging in conversation and answering question regarding history of dysphagia. Oral motor exam WFL. Observed pt with trials of thin liquids, which resulted in am immediate cough. Per pt, she previously used a L head turn with SLP at [facility]. This SLP attempted use of a L head turn with reduced signs of aspiration. Pt is edentulous and reports typically wearing dentures while eating. Pt with seemingly prolonged mastication with regular texture solids, although with complete oral clearance given a teaspoon of puree to soften the texture. Purees overall WFL and pt reports she typically takes her meds whole in puree. Called SLP at [facility]), who confirms pt was using a L head turn primarily with thin liquids, although had only recently started therapy so had only minimally been observed with POs. SLP recommended an OP MBS, which was unable to be completed prior to current admission. She reports pt was hesitant to try solids and was on a diet similar to mechanical soft (Dys 3), although typically liked solids to be "softer". She also states that pt appeared to have new onset changes with vocal quality PTA. Given pt's recent history of dysphagia and clinical signs concerning for aspiration this date, recommend MBS, which is tentatively scheduled for this afternoon. Recommend she remain NPO except for meds given crushed in purees pending MBS results.    HPI HPI: [name] is a 77 yo female presenting to ED 9/22 with three day history of AMS. CTH unremarkable. AMS thought to be secondary to sepsis. Recently admitted for syncopal episode resulting in rib fx and seen by SLP at that time with  prolonged mastication and was recommended a regular diet with thin liquids. SLP at [facility] reports pt coughing intermittently with thin liquids and mixed consistency solids, which appears to resolve with use of a L head turn. SLP recommended an OP MBS, which was unable to be completed prior to current admission. PMH includes T2DM, HTN, HLD, RLS, HFpEF, COPD      SLP Plan  MBS      Recommendations for follow up therapy are one component of a multi-disciplinary discharge planning process, led by the attending physician.  Recommendations may be updated based on patient status, additional functional criteria and insurance authorization.    Recommendations  Diet recommendations: NPO Medication Administration: Crushed with puree                  Oral care QID;Staff/trained caregiver to provide oral care   Frequent or constant Supervision/Assistance Dysphagia, unspecified (R13.10)     MBS     Gwynneth Aliment, M.A., CF-SLP Speech Language Pathology, Acute Rehabilitation Services  Secure Chat preferred [phone]   01/30/2023, 10:32 AM

## 2023-01-30 NOTE — Progress Notes (Addendum)
Modified Barium Swallow Study  Patient Details  Name: [name] MRN: [id number] Date of Birth: Oct 05, 1945  Today's Date: 01/30/2023  Modified Barium Swallow completed.  Full report located under Chart Review in the Imaging Section.  History of Present Illness [name] is a 77 yo female presenting to ED 9/22 with three day history of AMS. CTH unremarkable. AMS thought to be secondary to sepsis. Recently admitted for syncopal episode resulting in rib fx and seen by SLP at that time with prolonged mastication and was recommended a regular diet with thin liquids. SLP at [facility] reports pt coughing intermittently with thin liquids and mixed consistency solids, which appears to resolve with use of a L head turn. SLP recommended an OP MBS, which was unable to be completed prior to current admission. PMH includes T2DM, HTN, HLD, RLS, HFpEF, COPD   Clinical Impression Pt presents with a moderate oropharyngeal dysphagia characterized primarily by mistiming. The swallow is consistently initiated at the level of the pyriform sinuses, which results in frank penetration that eventually progresses below the level of the vocal folds without sensation (PAS 8). [name] cued cough was ineffective in clearing aspirates and her vocal quality became increasingly wet in nature. A L head turn was not consistently effective at preventing boluses entering the airway, intermittently resulting in thin liquids reaching the level of the vocal folds (PAS  5) without being expelled. Pt appears to have improved bolus control with thicker consistencies. Nectar thick liquids resulted in trace, transient penetration (PAS 2), which can be considered an age-related variant. No further penetration/aspiration noted with trials of purees or regular texture solid textures, although note pt's dentures were not in place for the study, resulting in prolonged mastication. The pill was given whole in apple sauce with slow, yet  complete clearance during the esophageal sweep. Given subsequent trials of pureed and liquid boluses, esophageal retention was noted with apparent backflow below the level of the PES. This may warrant furhter esophageal assessment. Recommend initiating diet of Dys 3 texture solids with nectar thick liquids. Provided education regarding esophageal precautions to pt and RN. Suspect as pt is reconditioned and continues ST, she may demonstrate potential to advance diet and more consistently use compensatory strategies. Will continue to follow. Factors that may increase risk of adverse event in presence of aspiration [employer] 2021): Reduced cognitive function;Limited mobility;Frail or deconditioned;Dependence for feeding and/or oral hygiene;Weak cough  Swallow Evaluation Recommendations Recommendations: PO diet PO Diet Recommendation: Dysphagia 3 (Mechanical soft);Mildly thick liquids (Level 2, nectar thick) Liquid Administration via: Cup;Straw Medication Administration: Whole meds with puree Supervision: Full assist for feeding;Full supervision/cueing for swallowing strategies Swallowing strategies  : Minimize environmental distractions;Slow rate;Small bites/sips Postural changes: Position pt fully upright for meals;Stay upright 30-60 min after meals Oral care recommendations: Oral care BID (2x/day);Staff/trained caregiver to provide oral care Recommended consults: Consider esophageal assessment Caregiver Recommendations: Avoid jello, ice cream, thin soups, popsicles;Remove water pitcher      Gwynneth Aliment, M.A., CF-SLP Speech Language Pathology, Acute Rehabilitation Services  Secure Chat preferred [phone]  01/30/2023,3:57 PM

## 2023-01-30 NOTE — Evaluation (Signed)
Occupational Therapy Evaluation Patient Details Name: [name] MRN: [id number] DOB: 03/21/1946 Today's Date: 01/30/2023   History of Present Illness This is a 77 year old female presented with AMS the last 2-3 days. Found to have acute pyelonephritis and acute metabolic encephalopathy, septic shock. Splenic laceration on CT possibly from fall on 7/2 when she had rib fractures, hypotension on pressors. PHMx: T2DM, HTN, HLD, RLS, HFpEF, COPD, CVA.  Patient is a resident of [facility] since July 2024.   Clinical Impression   This 77 yo female admitted with above presents to acute OT with PLOF of needing A for all ADLs from a wheelchair level (walking some with therapy). Currently she is setup-total A for ADLs at bed level (or even attempting sitting at EOB). She will continue to benefit from acute OT with follow up from continued inpatient follow up therapy, <3 hours/day.        If plan is discharge home, recommend the following: Two people to help with walking and/or transfers;A lot of help with bathing/dressing/bathroom;Assistance with cooking/housework;Help with stairs or ramp for entrance;Assist for transportation;Direct supervision/assist for financial management;Direct supervision/assist for medications management    Functional Status Assessment  Patient has had a recent decline in their functional status and demonstrates the ability to make significant improvements in function in a reasonable and predictable amount of time.  Equipment Recommendations  None recommended by OT       Precautions / Restrictions Precautions Precautions: Fall Precaution Comments: watch BP (pt kept saying she was "going out" but BP's looked good --see PT note) Restrictions Weight Bearing Restrictions: No      Mobility Bed Mobility Overal bed mobility: Needs Assistance Bed Mobility: Supine to Sit, Sit to Supine     Supine to sit: Max assist, +2 for physical assistance, +2 for safety/equipment,  HOB elevated, Used rails Sit to supine: Total assist, +2 for physical assistance, +2 for safety/equipment   General bed mobility comments: Max step-by-step cues for sequencing bringing LE's off EOB and use of bed rail to turn trunk. Max +2 with bed pad to sit up EOB. Total +2 to control lowering trunk and bring LE's onto bed.    Transfers                   General transfer comment: deferred      Balance Overall balance assessment: Needs assistance Sitting-balance support: Feet unsupported, Bilateral upper extremity supported Sitting balance-Leahy Scale: Poor Sitting balance - Comments: posterior and Lt lean. fluctuating from min-mod assist for seated EOB.                                   ADL either performed or assessed with clinical judgement   ADL Overall ADL's : Needs assistance/impaired Eating/Feeding: Set up;Bed level   Grooming: Moderate assistance;Bed level   Upper Body Bathing: Moderate assistance;Bed level   Lower Body Bathing: Total assistance;Bed level   Upper Body Dressing : Total assistance;Bed level   Lower Body Dressing: Total assistance;Bed level                       Vision Baseline Vision/History: 1 Wears glasses Patient Visual Report: No change from baseline              Pertinent Vitals/Pain Pain Assessment Pain Assessment: Faces Faces Pain Scale: Hurts little more Pain Location: peri area (greatly irritated from urine) Pain Descriptors / Indicators:  Discomfort, Burning Pain Intervention(s): Limited activity within patient's tolerance, Monitored during session, Repositioned     Extremity/Trunk Assessment Upper Extremity Assessment Upper Extremity Assessment: Generalized weakness;RUE deficits/detail;LUE deficits/detail;Right hand dominant RUE Deficits / Details: Limited AROM/PROM shoulder flexion; rest grossly 3/5 RUE Coordination: decreased gross motor LUE Deficits / Details: Limited AROM/PROM shoulder  flexion; rest grossly 3/5 LUE Coordination: decreased gross motor   Lower Extremity Assessment Lower Extremity Assessment: Generalized weakness;RLE deficits/detail;LLE deficits/detail RLE Deficits / Details: grossly 2+/5 or less for Bil LE, pt completed partial SLR and LAQ ankd ankle pumps. RLE Coordination: decreased gross motor LLE Deficits / Details: grossly 2+/5 or less for Bil LE, pt completed partial SLR and LAQ ankd ankle pumps. LLE Coordination: decreased gross motor   Cervical / Trunk Assessment Cervical / Trunk Assessment: Kyphotic   Communication Communication Communication: No apparent difficulties Cueing Techniques: Verbal cues   Cognition Arousal: Alert Behavior During Therapy: WFL for tasks assessed/performed Overall Cognitive Status: No family/caregiver present to determine baseline cognitive functioning                                                  Home Living Family/patient expects to be discharged to:: Skilled nursing facility                                 Additional Comments: PTA in July pt was living at home, since July has been at [facility], likely to dc back to SNF.      Prior Functioning/Environment Prior Level of Function : Needs assist       Physical Assist : ADLs (physical);Mobility (physical) Mobility (physical): Bed mobility;Transfers;Gait ADLs (physical): Grooming;Bathing;Dressing;Toileting Mobility Comments: pt has assist from staff to get in/out of bed, they assist with stand pivot transfers bed<>WC using RW. pt requires assist for sit<>stand with RW for toileting (change brief). Pt reports she can propel self in WC with bil UE/LE. ADLs Comments: pt reports staff at facility provide sponge bath in bed, she wears brief for toileting and staff assists with changing in standing (or bed) and provide pericare. pt requires assist for dressing every day.        OT Problem List: Decreased  strength;Decreased range of motion;Decreased activity tolerance;Impaired balance (sitting and/or standing);Decreased cognition;Decreased safety awareness      OT Treatment/Interventions: Self-care/ADL training;DME and/or AE instruction;Balance training;Patient/family education;Therapeutic exercise;Therapeutic activities    OT Goals(Current goals can be found in the care plan section) Acute Rehab OT Goals Patient Stated Goal: to feel better OT Goal Formulation: With patient Time For Goal Achievement: 02/13/23 Potential to Achieve Goals: Good  OT Frequency: Min 1X/week    Co-evaluation PT/OT/SLP Co-Evaluation/Treatment: Yes Reason for Co-Treatment: For patient/therapist safety;To address functional/ADL transfers   OT goals addressed during session: ADL's and self-care;Strengthening/ROM      AM-PAC OT "6 Clicks" Daily Activity     Outcome Measure Help from another person eating meals?: A Little Help from another person taking care of personal grooming?: A Lot Help from another person toileting, which includes using toliet, bedpan, or urinal?: Total Help from another person bathing (including washing, rinsing, drying)?: Total Help from another person to put on and taking off regular upper body clothing?: Total Help from another person to put on and taking off regular lower body clothing?: Total  6 Click Score: 9   End of Session    Activity Tolerance:  (kept saying she was "going out" but BPs remained well and she kept talking to [place] and never "went out") Patient left: in bed;with call bell/phone within reach;with bed alarm set  OT Visit Diagnosis: Other abnormalities of gait and mobility (R26.89);Muscle weakness (generalized) (M62.81)                Time: 1610-9604 OT Time Calculation (min): 25 min Charges:  OT General Charges $OT Visit: 1 Visit OT Evaluation $OT Eval Moderate Complexity: 1 Mod Cathy L. OT Acute Rehabilitation Services Office [phone]    Evette Georges 01/30/2023, 1:01 PM

## 2023-01-30 NOTE — Plan of Care (Signed)
  Problem: Education: Goal: Ability to describe self-care measures that may prevent or decrease complications (Diabetes Survival Skills Education) will improve 01/30/2023 1037 by Darryl Nestle, RN Outcome: Progressing 01/30/2023 1037 by Darryl Nestle, RN Outcome: Progressing Goal: Individualized Educational Video(s) 01/30/2023 1037 by Darryl Nestle, RN Outcome: Progressing 01/30/2023 1037 by Darryl Nestle, RN Outcome: Progressing   Problem: Coping: Goal: Ability to adjust to condition or change in health will improve 01/30/2023 1037 by Darryl Nestle, RN Outcome: Progressing 01/30/2023 1037 by Darryl Nestle, RN Outcome: Progressing   Problem: Fluid Volume: Goal: Ability to maintain a balanced intake and output will improve 01/30/2023 1037 by Darryl Nestle, RN Outcome: Progressing 01/30/2023 1037 by Darryl Nestle, RN Outcome: Progressing   Problem: Health Behavior/Discharge Planning: Goal: Ability to identify and utilize available resources and services will improve 01/30/2023 1037 by Darryl Nestle, RN Outcome: Progressing 01/30/2023 1037 by Darryl Nestle, RN Outcome: Progressing Goal: Ability to manage health-related needs will improve 01/30/2023 1037 by Darryl Nestle, RN Outcome: Progressing 01/30/2023 1037 by Darryl Nestle, RN Outcome: Progressing   Problem: Metabolic: Goal: Ability to maintain appropriate glucose levels will improve 01/30/2023 1037 by Darryl Nestle, RN Outcome: Progressing 01/30/2023 1037 by Darryl Nestle, RN Outcome: Progressing   Problem: Nutritional: Goal: Maintenance of adequate nutrition will improve Outcome: Progressing Goal: Progress toward achieving an optimal weight will improve Outcome: Progressing   Problem: Skin Integrity: Goal: Risk for impaired skin integrity will decrease Outcome: Progressing   Problem: Tissue Perfusion: Goal: Adequacy of tissue perfusion will  improve Outcome: Progressing   Problem: Education: Goal: Knowledge of General Education information will improve Description: Including pain rating scale, medication(s)/side effects and non-pharmacologic comfort measures Outcome: Progressing   Problem: Health Behavior/Discharge Planning: Goal: Ability to manage health-related needs will improve Outcome: Progressing   Problem: Clinical Measurements: Goal: Ability to maintain clinical measurements within normal limits will improve Outcome: Progressing Goal: Will remain free from infection Outcome: Progressing Goal: Diagnostic test results will improve Outcome: Progressing Goal: Respiratory complications will improve Outcome: Progressing Goal: Cardiovascular complication will be avoided Outcome: Progressing   Problem: Activity: Goal: Risk for activity intolerance will decrease Outcome: Progressing   Problem: Nutrition: Goal: Adequate nutrition will be maintained Outcome: Progressing   Problem: Coping: Goal: Level of anxiety will decrease Outcome: Progressing   Problem: Elimination: Goal: Will not experience complications related to bowel motility Outcome: Progressing   Problem: Pain Managment: Goal: General experience of comfort will improve Outcome: Progressing   Problem: Safety: Goal: Ability to remain free from injury will improve Outcome: Progressing   Problem: Skin Integrity: Goal: Risk for impaired skin integrity will decrease Outcome: Progressing

## 2023-01-30 NOTE — Evaluation (Signed)
Physical Therapy Evaluation Patient Details Name: [name] MRN: [id number] DOB: 1945-10-14 Today's Date: 01/30/2023  History of Present Illness  This is a 77 year old female presented with AMS the last 2-3 days. Found to have acute pyelonephritis and acute metabolic encephalopathy, septic shock. Splenic laceration on CT possibly from fall on 7/2 when she had rib fractures, hypotension on pressors. PHMx: T2DM, HTN, HLD, RLS, HFpEF, COPD, CVA.  Patient is a resident of [facility] since July 2024.   Clinical Impression  [name] is 77 y.o. female admitted with above HPI and diagnosis. Patient is currently limited by functional impairments below (see PT problem list). Patient has been residing at [facility] and is reports was working on ambulation with therapy at rehab and requiring 1 person assist for stand pivot transfers with RW to her wheelchair. Pt currently requires Max/Total +2 assist for supine<>sit and was unable to progress to standing due to fatigue and reports of dizziness despite BP stable (see below). Patient will benefit from continued skilled PT interventions to address impairments and progress independence with mobility. Patient will benefit from continued inpatient follow up therapy, <3 hours/day. Acute PT will follow and progress as able.     Vitals During Session:  Time Position BP (MAP)  1034 Supine 103/50 (58)  1036 Supine 110/66 (78)  1043 Seated EOB 126/59 (77)  1045 Seated EOB 129/63 (78)  1052 Supine (partial chair position) 114/96 (104)         If plan is discharge home, recommend the following: Assistance with cooking/housework;Two people to help with walking and/or transfers;A lot of help with bathing/dressing/bathroom;Direct supervision/assist for medications management;Assist for transportation;Help with stairs or ramp for entrance   Can travel by private vehicle   No    Equipment Recommendations None recommended by PT (defer to facility)   Recommendations for Other Services       Functional Status Assessment Patient has had a recent decline in their functional status and/or demonstrates limited ability to make significant improvements in function in a reasonable and predictable amount of time     Precautions / Restrictions Precautions Precautions: Fall Precaution Comments: watch BP Restrictions Weight Bearing Restrictions: No      Mobility  Bed Mobility Overal bed mobility: Needs Assistance Bed Mobility: Supine to Sit, Sit to Supine     Supine to sit: Max assist, +2 for physical assistance, +2 for safety/equipment, HOB elevated, Used rails Sit to supine: Total assist, +2 for physical assistance, +2 for safety/equipment   General bed mobility comments: Max step-by-step cues for sequencing bringing LE's off EOB and use of bed rail to turn trunk. Max +2 with bed pad to sit up EOB. Total +2 to control lowering trunk and bring LE's onto bed.    Transfers                   General transfer comment: deferred    Ambulation/Gait                  Stairs            Wheelchair Mobility     Tilt Bed    Modified Rankin (Stroke Patients Only)       Balance Overall balance assessment: Needs assistance Sitting-balance support: Feet unsupported, Bilateral upper extremity supported Sitting balance-Leahy Scale: Poor Sitting balance - Comments: posterior and Lt lean. fluctuating from min-mod assist for seated EOB.  Pertinent Vitals/Pain Pain Assessment Pain Assessment: Faces Faces Pain Scale: Hurts little more Pain Location: periarea Pain Descriptors / Indicators: Discomfort, Burning Pain Intervention(s): Limited activity within patient's tolerance, Monitored during session, Repositioned    Home Living Family/patient expects to be discharged to:: Skilled nursing facility                   Additional Comments: PTA in July pt was  living at home, since July has been at [facility], likely to dc back to SNF.    Prior Function Prior Level of Function : Needs assist       Physical Assist : ADLs (physical);Mobility (physical) Mobility (physical): Bed mobility;Transfers;Gait ADLs (physical): Grooming;Bathing;Dressing;Toileting Mobility Comments: pt has assist from staff to get in/out of bed, they assist with stand pivot transfers bed<>WC using RW. pt requires assist for sit<>stand with RW for toileting (change brief). Pt reports she can propel self in WC with bil UE/LE. ADLs Comments: pt reports staff at facility provide sponge bath in bed, she wears brief for toileting and staff assists with changing in standing (or bed) and provide pericare. pt requires assist for dressing every day.     Extremity/Trunk Assessment   Upper Extremity Assessment Upper Extremity Assessment: Defer to OT evaluation    Lower Extremity Assessment Lower Extremity Assessment: Generalized weakness;RLE deficits/detail;LLE deficits/detail RLE Deficits / Details: grossly 2+/5 or less for Bil LE, pt completed partial SLR and LAQ ankd ankle pumps. RLE Coordination: decreased gross motor LLE Deficits / Details: grossly 2+/5 or less for Bil LE, pt completed partial SLR and LAQ ankd ankle pumps. LLE Coordination: decreased gross motor    Cervical / Trunk Assessment Cervical / Trunk Assessment: Kyphotic  Communication   Communication Communication: No apparent difficulties Cueing Techniques: Verbal cues  Cognition Arousal: Alert Behavior During Therapy: WFL for tasks assessed/performed Overall Cognitive Status: No family/caregiver present to determine baseline cognitive functioning                                          General Comments      Exercises     Assessment/Plan    PT Assessment Patient needs continued PT services  PT Problem List Decreased strength;Decreased activity tolerance;Decreased  balance;Decreased mobility;Decreased knowledge of use of DME;Decreased safety awareness;Decreased knowledge of precautions;Cardiopulmonary status limiting activity       PT Treatment Interventions DME instruction;Gait training;Stair training;Functional mobility training;Therapeutic activities;Therapeutic exercise;Balance training;Neuromuscular re-education;Cognitive remediation;Patient/family education;Wheelchair mobility training    PT Goals (Current goals can be found in the Care Plan section)  Acute Rehab PT Goals Patient Stated Goal: regain strength and get better PT Goal Formulation: With patient Time For Goal Achievement: 02/13/23 Potential to Achieve Goals: Fair    Frequency Min 1X/week     Co-evaluation               AM-PAC PT "6 Clicks" Mobility  Outcome Measure Help needed turning from your back to your side while in a flat bed without using bedrails?: Total Help needed moving from lying on your back to sitting on the side of a flat bed without using bedrails?: Total Help needed moving to and from a bed to a chair (including a wheelchair)?: Total Help needed standing up from a chair using your arms (e.g., wheelchair or bedside chair)?: Total Help needed to walk in hospital room?: Total Help needed climbing 3-5 steps with a  railing? : Total 6 Click Score: 6    End of Session Equipment Utilized During Treatment: Oxygen Activity Tolerance: Patient tolerated treatment well Patient left: in bed;with call bell/phone within reach;with bed alarm set;with family/visitor present (chair position) Nurse Communication: Mobility status PT Visit Diagnosis: Unsteadiness on feet (R26.81);Other abnormalities of gait and mobility (R26.89);Muscle weakness (generalized) (M62.81);Difficulty in walking, not elsewhere classified (R26.2)    Time: 4098-1191 PT Time Calculation (min) (ACUTE ONLY): 25 min   Charges:   PT Evaluation $PT Eval Moderate Complexity: 1 Mod   PT General  Charges $$ ACUTE PT VISIT: 1 Visit         Wynn Maudlin, DPT Acute Rehabilitation Services Office [phone]  01/30/23 11:15 AM

## 2023-01-31 DIAGNOSIS — N10 Acute tubulo-interstitial nephritis: Secondary | ICD-10-CM | POA: Diagnosis not present

## 2023-01-31 DIAGNOSIS — R6521 Severe sepsis with septic shock: Secondary | ICD-10-CM | POA: Diagnosis not present

## 2023-01-31 DIAGNOSIS — A419 Sepsis, unspecified organism: Secondary | ICD-10-CM | POA: Diagnosis not present

## 2023-01-31 LAB — GLUCOSE, CAPILLARY
Glucose-Capillary: 104 mg/dL — ABNORMAL HIGH (ref 70–99)
Glucose-Capillary: 122 mg/dL — ABNORMAL HIGH (ref 70–99)
Glucose-Capillary: 104 mg/dL — ABNORMAL HIGH (ref 70–99)
Glucose-Capillary: 113 mg/dL — ABNORMAL HIGH (ref 70–99)
Glucose-Capillary: 144 mg/dL — ABNORMAL HIGH (ref 70–99)

## 2023-01-31 LAB — BASIC METABOLIC PANEL
Anion gap: 11 (ref 5–15)
BUN: 34 mg/dL — ABNORMAL HIGH (ref 8–23)
CO2: 27 mmol/L (ref 22–32)
Calcium: 7.8 mg/dL — ABNORMAL LOW (ref 8.9–10.3)
Chloride: 99 mmol/L (ref 98–111)
Creatinine, Ser: 1.11 mg/dL — ABNORMAL HIGH (ref 0.44–1.00)
GFR, Estimated: 52 mL/min — ABNORMAL LOW (ref >60)
Glucose, Bld: 113 mg/dL — ABNORMAL HIGH (ref 70–99)
Potassium: 4 mmol/L (ref 3.5–5.1)
Sodium: 137 mmol/L (ref 135–145)

## 2023-01-31 LAB — CULTURE, BLOOD (ROUTINE X 2): Special Requests: ADEQUATE

## 2023-01-31 LAB — CBC
HCT: 31.3 % — ABNORMAL LOW (ref 36.0–46.0)
Hemoglobin: 10.5 g/dL — ABNORMAL LOW (ref 12.0–15.0)
MCH: 29.7 pg (ref 26.0–34.0)
MCHC: 33.5 g/dL (ref 30.0–36.0)
MCV: 88.7 fL (ref 80.0–100.0)
Platelets: 189 10*3/uL (ref 150–400)
RBC: 3.53 MIL/uL — ABNORMAL LOW (ref 3.87–5.11)
RDW: 15.5 % (ref 11.5–15.5)
WBC: 22.4 10*3/uL — ABNORMAL HIGH (ref 4.0–10.5)
nRBC: 0 % (ref 0.0–0.2)

## 2023-01-31 MED ORDER — SODIUM CHLORIDE 0.9 % IV SOLN
1.0000 g | Freq: Two times a day (BID) | INTRAVENOUS | Status: DC
  Administered 2023-01-31 – 2023-02-01 (×2): 1 g via INTRAVENOUS
  Filled 2023-01-31 (×2): qty 20

## 2023-01-31 MED ORDER — MIDODRINE HCL 5 MG PO TABS
10.0000 mg | ORAL_TABLET | Freq: Three times a day (TID) | ORAL | Status: DC
  Administered 2023-01-31 – 2023-02-02 (×7): 10 mg via ORAL
  Filled 2023-01-31 (×7): qty 2

## 2023-01-31 MED ORDER — CALCIUM GLUCONATE-NACL 2-0.675 GM/100ML-% IV SOLN
2.0000 g | Freq: Once | INTRAVENOUS | Status: AC
  Administered 2023-01-31: 2000 mg via INTRAVENOUS
  Filled 2023-01-31: qty 100

## 2023-01-31 NOTE — [place] FL2 (Signed)
[place] MEDICAID FL2 LEVEL OF CARE FORM     IDENTIFICATION  Patient Name: [name] Birthdate: January 25, 1946 Sex: female Admission Date (Current Location): 01/28/2023  [facility] and [place] Number:  Producer, television/film/video and Address:  [facility] [place], [address], [place], [place] [address]      Provider Number: [id number]  Attending Physician Name and Address:  Cheri Fowler, MD  Relative Name and Phone Number:       Current Level of Care: Hospital Recommended Level of Care: Skilled Nursing Facility Prior Approval Number:    Date Approved/Denied:   PASRR Number: [id number]  Discharge Plan: SNF    Current Diagnoses: Patient Active Problem List   Diagnosis Date Noted   Pyelonephritis 01/29/2023   Septic shock (HCC) 01/29/2023   Goals of care, counseling/discussion 01/29/2023   Acute pyelonephritis 01/28/2023   Syncope and collapse 11/07/2022   Right 7-9th rib fractures 11/07/2022   Chronic venous insufficiency 09/27/2021   Overgrown toenails 12/16/2020   Onychogryposis of toenail 09/07/2020   Cerebrovascular disease    Spondylosis    Osteoporosis    Lumbar disc narrowing    Living in nursing home    Hypertension    Diabetes mellitus without complication (HCC)    CHF (congestive heart failure) (HCC)    Acute pain of left knee    Fall 11/21/2018   Carotid stenosis, right 11/18/2018   Uncontrolled type 2 diabetes mellitus with hypoglycemia, with long-term current use of insulin (HCC) 11/18/2018   Family hx-stroke 11/18/2018   Cerebral infarction due to stenosis of right carotid artery (HCC) s/p tPA 11/15/2018   Pustules determined by examination 04/11/2018   Hyperlipidemia 01/14/2018   COPD (chronic obstructive pulmonary disease) (HCC) 01/14/2018   Angina pectoris (HCC) 07/28/2016   Chronic diastolic congestive heart failure (HCC) 07/28/2016   Hypertensive heart disease with heart failure (HCC) 07/28/2016   Diabetic polyneuropathy  associated with type 2 diabetes mellitus (HCC) 11/30/2015   Pain due to onychomycosis of toenail of right foot 08/17/2015    Orientation RESPIRATION BLADDER Height & Weight     Self, Place  Normal Incontinent, Indwelling catheter Weight: 186 lb 11.7 oz (84.7 kg) Height:     BEHAVIORAL SYMPTOMS/MOOD NEUROLOGICAL BOWEL NUTRITION STATUS      Incontinent Diet (See dc summary)  AMBULATORY STATUS COMMUNICATION OF NEEDS Skin   Extensive Assist Verbally PU Stage and Appropriate Care (Stage II on buttocks; MASD on vagina)                       Personal Care Assistance Level of Assistance  Bathing, Feeding, Dressing Bathing Assistance: Maximum assistance Feeding assistance: Maximum assistance Dressing Assistance: Maximum assistance     Functional Limitations Info             SPECIAL CARE FACTORS FREQUENCY  PT (By licensed PT), OT (By licensed OT)     PT Frequency: eval and treat OT Frequency: eval and treat            Contractures Contractures Info: Not present    Additional Factors Info  Code Status, Allergies, Isolation Precautions Code Status Info: DNR Allergies Info: Alendronate, Valsartan     Isolation Precautions Info: MRSA     Current Medications (01/31/2023):  This is the current hospital active medication list Current Facility-Administered Medications  Medication Dose Route Frequency Provider Last Rate Last Admin   acetaminophen (TYLENOL) tablet 650 mg  650 mg Oral Q6H PRN Crosley, Debby,  MD       Or   acetaminophen (TYLENOL) suppository 650 mg  650 mg Rectal Q6H PRN Gery Pray, MD   650 mg at 01/30/23 0753   aspirin chewable tablet 81 mg  81 mg Oral Daily Crosley, Debby, MD   81 mg at 01/31/23 1007   atorvastatin (LIPITOR) tablet 80 mg  80 mg Oral QHS Crosley, Debby, MD   80 mg at 01/30/23 2208   Chlorhexidine Gluconate Cloth 2 % PADS 6 each  6 each Topical Daily Stechschulte, Hyman Hopes, MD   6 each at 01/30/23 1000   clopidogrel (PLAVIX) tablet 75 mg   75 mg Oral q morning Crosley, Debby, MD   75 mg at 01/31/23 1007   docusate sodium (COLACE) capsule 100 mg  100 mg Oral BID PRN Lidia Collum, PA-C       ezetimibe (ZETIA) tablet 10 mg  10 mg Oral q morning Crosley, Debby, MD   10 mg at 01/31/23 1007   Gerhardt's butt cream   Topical BID Cheri Fowler, MD   Given at 01/31/23 1007   heparin injection 5,000 Units  5,000 Units Subcutaneous Q8H Crosley, Debby, MD   5,000 Units at 01/31/23 0522   insulin aspart (novoLOG) injection 0-9 Units  0-9 Units Subcutaneous Q4H Pia Mau D, PA-C   1 Units at 01/31/23 0825   insulin glargine-yfgn (SEMGLEE) injection 5 Units  5 Units Subcutaneous BID Cheri Fowler, MD   5 Units at 01/31/23 1007   ipratropium-albuterol (DUONEB) 0.5-2.5 (3) MG/3ML nebulizer solution 3 mL  3 mL Nebulization Q4H PRN Lidia Collum, PA-C       lactated ringers infusion   Intravenous Continuous Pia Mau D, PA-C 100 mL/hr at 01/31/23 0700 Infusion Verify at 01/31/23 0700   meropenem (MERREM) 1 g in sodium chloride 0.9 % 100 mL IVPB  1 g Intravenous Q12H Cheri Fowler, MD       midodrine (PROAMATINE) tablet 10 mg  10 mg Oral Q8H Chand, Garnet Sierras, MD       mupirocin ointment (BACTROBAN) 2 %   Nasal BID Cheri Fowler, MD   Given at 01/31/23 1008   norepinephrine (LEVOPHED) 4mg  in (0.016 mg/mL) premix infusion  2-20 mcg/min Intravenous Titrated Paliwal, Aditya, MD 18.75 mL/hr at 01/31/23 0700 5 mcg/min at 01/31/23 0700   ondansetron (ZOFRAN) tablet 4 mg  4 mg Oral Q6H PRN Gery Pray, MD       Or   ondansetron (ZOFRAN) injection 4 mg  4 mg Intravenous Q6H PRN Joneen Roach, Debby, MD   4 mg at 01/29/23 1753   Oral care mouth rinse  15 mL Mouth Rinse PRN Stechschulte, Hyman Hopes, MD       polyethylene glycol (MIRALAX / GLYCOLAX) packet 17 g  17 g Oral Daily Crosley, Debby, MD       polyethylene glycol (MIRALAX / GLYCOLAX) packet 17 g  17 g Oral Daily PRN Pia Mau D, PA-C       senna-docusate (Senokot-S) tablet 1 tablet  1 tablet Oral QHS  PRN Gery Pray, MD         Discharge Medications: Please see discharge summary for a list of discharge medications.  Relevant Imaging Results:  Relevant Lab Results:   Additional Information SSN: [id number] [place], [place]

## 2023-01-31 NOTE — Progress Notes (Signed)
Physical Therapy Treatment Patient Details Name: [name] MRN: [id number] DOB: [birth year] Today's Date: 01/31/2023   History of Present Illness This is a 77 year old female presented with AMS the last 2-3 days. Found to have acute pyelonephritis and acute metabolic encephalopathy, septic shock. Splenic laceration on CT possibly from fall on 7/2 when she had rib fractures, hypotension on pressors. PHMx: T2DM, HTN, HLD, RLS, HFpEF, COPD, CVA.  Patient is a resident of [facility] since July 2024.    PT Comments  Patient resting in bed and awake but more flat in affect and appears fatigued. Pt oriented to place, month/year, and self, but not aware of situation and required some re-orientation. Pt agreeable to attempt mobility. Reports dizziness with sitting EOB, Max Assist required to move supine>sit. BP noted to drop dangling EOB and max/total assist to return to supine. 2+ for boost and pt adjusted to chair position and supine exercises completed. Will continue to progress pt as able.  Vitals During Session:  Time Position BP (MAP)  9:41 Supine 103/53 (66)  9:43 Seated EOB 86/74 (80)  9:45 Supine 98/83 (65)  9:51 Supine 99/47 (61)  10:00 Chair position 96/48 (63)      If plan is discharge home, recommend the following: Assistance with cooking/housework;Two people to help with walking and/or transfers;A lot of help with bathing/dressing/bathroom;Direct supervision/assist for medications management;Assist for transportation;Help with stairs or ramp for entrance   Can travel by private vehicle     No  Equipment Recommendations  None recommended by PT (defer to facility)    Recommendations for Other Services       Precautions / Restrictions Precautions Precautions: Fall Precaution Comments: watch BP Restrictions Weight Bearing Restrictions: No     Mobility  Bed Mobility Overal bed mobility: Needs Assistance Bed Mobility: Supine to Sit, Sit to Supine     Supine to sit: Max  assist, HOB elevated, Used rails Sit to supine: Max assist, Total assist   General bed mobility comments: Max step-by-step cues for sequencing bringing LE's off EOB and use of bed rail to turn trunk.HOB elevated for use of support to assist with supine>sit. pt c/o dizziness and made it partially ~75% of upright sitting EOB.    Transfers                   General transfer comment: deferred    Ambulation/Gait                   Stairs             Wheelchair Mobility     Tilt Bed    Modified Rankin (Stroke Patients Only)       Balance Overall balance assessment: Needs assistance Sitting-balance support: Feet unsupported, Bilateral upper extremity supported Sitting balance-Leahy Scale: Poor Sitting balance - Comments: posterior and Lt lean. fluctuating from min-mod assist for seated EOB.                                    Cognition Arousal: Alert Behavior During Therapy: Flat affect Overall Cognitive Status: No family/caregiver present to determine baseline cognitive functioning                                 General Comments: pt very flat with slow processing and delayed responses to all questions. pt oriented to place, month/year  but not date/day, and not to situation.        Exercises General Exercises - Lower Extremity Ankle Circles/Pumps: AROM, Both, 10 reps, Supine Short Arc Quad: AROM, Both, 10 reps, Supine    General Comments        Pertinent Vitals/Pain Pain Assessment Pain Assessment: Faces Faces Pain Scale: Hurts a little bit Pain Location: periarea Pain Descriptors / Indicators: Discomfort Pain Intervention(s): Monitored during session, Limited activity within patient's tolerance, Repositioned    Home Living                          Prior Function            PT Goals (current goals can now be found in the care plan section) Acute Rehab PT Goals Patient Stated Goal: regain strength  and get better PT Goal Formulation: With patient Time For Goal Achievement: 02/13/23 Potential to Achieve Goals: Fair Progress towards PT goals: Progressing toward goals    Frequency    Min 1X/week      PT Plan      Co-evaluation              AM-PAC PT "6 Clicks" Mobility   Outcome Measure  Help needed turning from your back to your side while in a flat bed without using bedrails?: Total Help needed moving from lying on your back to sitting on the side of a flat bed without using bedrails?: Total Help needed moving to and from a bed to a chair (including a wheelchair)?: Total Help needed standing up from a chair using your arms (e.g., wheelchair or bedside chair)?: Total Help needed to walk in hospital room?: Total Help needed climbing 3-5 steps with a railing? : Total 6 Click Score: 6    End of Session Equipment Utilized During Treatment: Oxygen Activity Tolerance: Patient tolerated treatment well Patient left: in bed;with call bell/phone within reach;with bed alarm set;with family/visitor present (chair position) Nurse Communication: Mobility status PT Visit Diagnosis: Unsteadiness on feet (R26.81);Other abnormalities of gait and mobility (R26.89);Muscle weakness (generalized) (M62.81);Difficulty in walking, not elsewhere classified (R26.2)     Time: 0930-1000 PT Time Calculation (min) (ACUTE ONLY): 30 min  Charges:    $Therapeutic Exercise: 8-22 mins $Therapeutic Activity: 8-22 mins PT General Charges $$ ACUTE PT VISIT: 1 Visit                     Wynn Maudlin, DPT Acute Rehabilitation Services Office [phone]  01/31/23 12:24 PM

## 2023-01-31 NOTE — Progress Notes (Signed)
NAME:  [name], MRN:  [id number], DOB:  August 08, 1945, LOS: 3 ADMISSION DATE:  01/28/2023, CONSULTATION DATE:  9/23 REFERRING MD:  Dr. Margo Aye, CHIEF COMPLAINT:  septic shock   History of Present Illness:  Patient is a 77 year old female with pertinent PMH T2DM, HTN, HLD, HFpEF, COPD presents to [facility] ED on 9/22 with septic shock.  Patient is a resident at [facility].  Patient has been increasingly more confused over the past 3 days.  On 9/22 came to [facility] ED for further workup.  On arrival BP soft 101/45.  Initially afebrile.  WBC 15.7.  Sepsis protocol initiated.  Cultures obtained, IV fluids given, started on vancomycin and Rocephin.  CXR with persistent area of pleural thickening at periphery of right lung base.  CT ABD/pelvis showing concern for pyelonephritis in both kidneys and mild urothelial thickening; 1.9 cm subscapular linear hypodensity on spleen appears to be grade 2 laceration likely late subacute; partially healed fractures of the right seventh and eighth ribs.  UA with many bacteria and positive nitrites.  Creatinine 1.67.  CT head no acute abnormality.  Patient admitted by [facility].  CCS consulted for splenic laceration.  Overnight patient noted to have wide-complex tachycardia with rates over 200s that would convert back to sinus tach ED spontaneously.  Patient more lethargic.  Patient more hypotensive with maps in 54s.  Fever now 102.9 F and antibiotics broadened to meropenem and vancomycin.  Despite gentle IV fluids patient remained hypotensive and required pressors.  PCCM consulted for ICU admission.  Pertinent  Medical History   Past Medical History:  Diagnosis Date   Acute pain of left knee    Angina pectoris (HCC) 07/28/2016   With normal coronary arteriography   Carotid stenosis, right 11/18/2018   Cerebral infarction due to stenosis of right carotid artery (HCC) s/p tPA 11/15/2018   CHF (congestive heart failure) (HCC)    Chronic diastolic congestive heart failure  (HCC) 07/28/2016   Chronic venous insufficiency 09/27/2021   COPD (chronic obstructive pulmonary disease) (HCC) 01/14/2018   Diabetes mellitus type II, uncontrolled 11/18/2018   Diabetes mellitus without complication (HCC)    Diabetic polyneuropathy associated with type 2 diabetes mellitus (HCC) 11/30/2015   Fall 11/21/2018   Family hx-stroke 11/18/2018   Hyperlipidemia    Hypertension    Hypertensive heart disease with heart failure (HCC) 07/28/2016   Living in nursing home    Lumbar disc narrowing    Onychogryposis of toenail 09/07/2020   Osteoporosis    Overgrown toenails 12/16/2020   Pustules determined by examination 04/11/2018   Spondylosis    lumbosacral   Stroke (HCC)      Significant Hospital Events: Including procedures, antibiotic start and stop dates in addition to other pertinent events   9/23 admitted w/ septic shock from uti w/ pyelo; now on pressors  Interim History / Subjective:  Remain afebrile Blood and urine cultures are positive for ESBL E. coli, sensitivities are pending Remain on low-dose vasopressor support with Levophed  Stated feeling better this morning  Objective   Blood pressure 115/66, pulse 64, temperature 98.4 F (36.9 C), temperature source Oral, resp. rate 16, weight 84.7 kg, SpO2 97%.    FiO2 (%):  [21 %] 21 %   Intake/Output Summary (Last 24 hours) at 01/31/2023 0812 Last data filed at 01/31/2023 0700 Gross per 24 hour  Intake 2934.3 ml  Output 1660 ml  Net 1274.3 ml   Filed Weights   01/29/23 0322 01/30/23 0600 01/31/23 0500  Weight: 73.5 kg 82.9 kg 84.7 kg    Examination: General: Elderly female, lying on the bed HEENT: [place]/AT, eyes anicteric.  moist mucus membranes Neuro: Alert, awake following commands Chest: Coarse breath sounds, no wheezes or rhonchi Heart: Regular rate and rhythm, no murmurs or gallops Abdomen: Soft, nontender, nondistended, bowel sounds present Skin: No rash   Labs pending Blood and urine culture growing ESBL  E. coli  Resolved Hospital Problem list   Lactic acidosis Acute septic encephalopathy  Assessment & Plan:  Severe sepsis with septic shock due to bilateral acute pyelonephritis with ESBL E. coli, POA ESBL E. coli bacteremia Patient is afebrile Shock is improving, currently on low-dose vasopressor support, will try to taper it off Continue IV meropenem to complete 14 days therapy considering positive blood culture Continue midodrine 10 mg 3 times daily Follow-up sensitivity results on urine and blood cultures  Wide-complex tachycardia likely due to electrolyte abnormalities, resolved Hypokalemia/hypophosphatemia/hypomagnesemia/hypocalcemia  Patient remained in sinus rhythm, no further episodes of arrhythmia Closely monitor electrolytes and replace Electrolytes have corrected  AKI on CKD 3a Continue IV fluid, serum creatinine continue to improve, now down to 1.1 Avoid nephrotoxic agent Monitor intake and output Trend BMP  Chronic HFpEF HLD Echo on 11/2022 lvef 65-70%; grade II diastolic dysfunction Monitor intake and output She is not on GDMT therapy, consider starting once blood pressure improves  Constipation Continue aggressive bowel regimen  T2DM w/ peripheral neuropathy with hyperglycemia Blood sugars are better controlled now Continue insulin with CBG goal 140-180 Hold gabapentin  Dysphagia Seen by speech and swallow, underwent modified barium study, recommendation was to have dysphagia 3 diet  COPD, not in exacerbation Continue as needed new DuoNeb  Subacute splenic laceration Subacute rib fractures status post fall Continue pain control PT/OT evaluation  Best Practice (right click and "Reselect all SmartList Selections" daily)   Diet/type: Dysphagia 3 diet DVT prophylaxis: prophylactic heparin  GI prophylaxis: N/A Lines: N/A Foley:  N/A Code Status:  DNR Last date of multidisciplinary goals of care discussion [9/23 updated husband over phone]  Labs    CBC: Recent Labs  Lab 01/28/23 1902 01/29/23 0146 01/29/23 0343 01/29/23 0356 01/29/23 0620 01/30/23 0514 01/31/23 0449  WBC 15.7* 2.8* 8.9  --  22.5* 30.4* 22.4*  NEUTROABS 12.5* 2.3  --   --   --   --   --   HGB 11.7* 11.8* 11.8* 10.2* 12.1 10.7* 10.5*  HCT 36.0 36.2 37.4 30.0* 37.6 33.2* 31.3*  MCV 89.1 88.1 89.7  --  92.4 88.5 88.7  PLT 213 149* 154  --  170 190 189    Basic Metabolic Panel: Recent Labs  Lab 01/29/23 0146 01/29/23 0343 01/29/23 0356 01/29/23 1428 01/30/23 0754 01/31/23 0449  NA 135 136 137 131* 135 137  K 3.3* 3.1* 3.2* 4.5 3.9 4.0  CL 94* 95*  --  94* 98 99  CO2 24 27  --  22 24 27   GLUCOSE 109* 100*  --  271* 126* 113*  BUN 36* 38*  --  40* 41* 34*  CREATININE 1.37* 1.49*  --  1.68* 1.46* 1.11*  CALCIUM 7.9* 7.9*  --  7.8* 7.9* 7.8*  MG 1.5* 1.5*  --  2.0 2.1  --   PHOS  --  2.3*  --   --  3.8  --    GFR: Estimated Creatinine Clearance: 44.4 mL/min (A) (by C-G formula based on SCr of 1.11 mg/dL (H)). Recent Labs  Lab 01/28/23 1922 01/29/23 0146 01/29/23  1324 01/29/23 0403 01/29/23 0620 01/30/23 0514 01/31/23 0449  WBC  --    < > 8.9  --  22.5* 30.4* 22.4*  LATICACIDVEN 1.7  --   --  3.3* 2.5*  --   --    < > = values in this interval not displayed.    Liver Function Tests: Recent Labs  Lab 01/28/23 1902 01/29/23 0343 01/30/23 0754  AST 38 48* 46*  ALT 31 31 35  ALKPHOS 76 140* 95  BILITOT 1.3* 1.0 0.8  PROT 6.8 5.9* 5.8*  ALBUMIN 2.7* 2.3* 2.1*   Recent Labs  Lab 01/28/23 1902  LIPASE 22   Recent Labs  Lab 01/29/23 0620  AMMONIA 25    ABG    Component Value Date/Time   HCO3 29.9 (H) 01/29/2023 0601   TCO2 32 01/29/2023 0356   O2SAT 77.9 01/29/2023 0601     Coagulation Profile: No results for input(s): "INR", "PROTIME" in the last 168 hours.  Cardiac Enzymes: No results for input(s): "CKTOTAL", "CKMB", "CKMBINDEX", "TROPONINI" in the last 168 hours.  HbA1C: Hgb A1c MFr Bld  Date/Time Value Ref Range  Status  11/08/2022 03:14 AM 8.2 (H) 4.8 - 5.6 % Final    Comment:    (NOTE) Pre diabetes:          5.7%-6.4%  Diabetes:              >6.4%  Glycemic control for   <7.0% adults with diabetes   11/16/2018 03:15 AM 10.4 (H) 4.8 - 5.6 % Final    Comment:    (NOTE)         Prediabetes: 5.7 - 6.4         Diabetes: >6.4         Glycemic control for adults with diabetes: <7.0     CBG: Recent Labs  Lab 01/30/23 1201 01/30/23 1625 01/30/23 1916 01/30/23 2316 01/31/23 0337  GLUCAP 155* 184* 194* 151* 104*    The patient is critically ill due to septic shock due to acute bilateral pyelonephritis and ESBL E. coli bacteremia.  Critical care was necessary to treat or prevent imminent or life-threatening deterioration.  Critical care was time spent personally by me on the following activities: development of treatment plan with patient and/or surrogate as well as nursing, discussions with consultants, evaluation of patient's response to treatment, examination of patient, obtaining history from patient or surrogate, ordering and performing treatments and interventions, ordering and review of laboratory studies, ordering and review of radiographic studies, pulse oximetry, re-evaluation of patient's condition and participation in multidisciplinary rounds.   During this encounter critical care time was devoted to patient care services described in this note for 33 minutes.     Cheri Fowler, MD [place] [facility] See Amion for pager If no response to pager, please call [phone] until 7pm After 7pm, Please call E-link [phone]

## 2023-01-31 NOTE — Progress Notes (Signed)
Speech Language Pathology Treatment: Dysphagia  Patient Details Name: [name] MRN: [id number] DOB: June 12, 1945 Today's Date: 01/31/2023 Time: 4098-1191 SLP Time Calculation (min) (ACUTE ONLY): 10 min  Assessment / Plan / Recommendation Clinical Impression  Pt reports a general distaste for thickened liquids and displays little to no carryover of what necessitates their implementation, requiring multiple reminders of dysphagia. Observed pt with trials of Dys 3 textures and nectar thick liquids via cup with no clinical signs concerning for dysphagia or aspiration. [name] dentures donned this date which appeared to improve mastication. Current diet recommendation remains appropriate. Will continue to follow to target compensatory strategies and assess ability to advance diet.    HPI HPI: [name] is a 77 yo female presenting to ED 9/22 with three day history of AMS. CTH unremarkable. AMS thought to be secondary to sepsis. Recently admitted for syncopal episode resulting in rib fx and seen by SLP at that time with prolonged mastication and was recommended a regular diet with thin liquids. SLP at [facility] reports pt coughing intermittently with thin liquids and mixed consistency solids, which appears to resolve with use of a L head turn. SLP recommended an OP MBS, which was unable to be completed prior to current admission. PMH includes T2DM, HTN, HLD, RLS, HFpEF, COPD      SLP Plan  Continue with current plan of care      Recommendations for follow up therapy are one component of a multi-disciplinary discharge planning process, led by the attending physician.  Recommendations may be updated based on patient status, additional functional criteria and insurance authorization.    Recommendations  Diet recommendations: Dysphagia 3 (mechanical soft);Nectar-thick liquid Liquids provided via: Straw;Cup Medication Administration: Crushed with puree Supervision: Staff to assist with self  feeding;Full supervision/cueing for compensatory strategies Compensations: Slow rate;Small sips/bites Postural Changes and/or Swallow Maneuvers: Seated upright 90 degrees;Upright 30-60 min after meal                  Oral care QID;Staff/trained caregiver to provide oral care   Frequent or constant Supervision/Assistance Dysphagia, oropharyngeal phase (R13.12)     Continue with current plan of care     Gwynneth Aliment, M.A., CF-SLP Speech Language Pathology, Acute Rehabilitation Services  Secure Chat preferred [phone]   01/31/2023, 3:45 PM

## 2023-02-01 ENCOUNTER — Ambulatory Visit: Payer: Medicare Other | Admitting: Cardiology

## 2023-02-01 DIAGNOSIS — R000 Tachycardia, unspecified: Secondary | ICD-10-CM

## 2023-02-01 DIAGNOSIS — R7881 Bacteremia: Secondary | ICD-10-CM

## 2023-02-01 DIAGNOSIS — N179 Acute kidney failure, unspecified: Secondary | ICD-10-CM | POA: Diagnosis not present

## 2023-02-01 DIAGNOSIS — E876 Hypokalemia: Secondary | ICD-10-CM | POA: Insufficient documentation

## 2023-02-01 DIAGNOSIS — K5900 Constipation, unspecified: Secondary | ICD-10-CM | POA: Insufficient documentation

## 2023-02-01 DIAGNOSIS — N1831 Chronic kidney disease, stage 3a: Secondary | ICD-10-CM

## 2023-02-01 DIAGNOSIS — N10 Acute tubulo-interstitial nephritis: Secondary | ICD-10-CM | POA: Diagnosis not present

## 2023-02-01 DIAGNOSIS — Z7401 Bed confinement status: Secondary | ICD-10-CM

## 2023-02-01 DIAGNOSIS — R1310 Dysphagia, unspecified: Secondary | ICD-10-CM

## 2023-02-01 DIAGNOSIS — L8990 Pressure ulcer of unspecified site, unspecified stage: Secondary | ICD-10-CM | POA: Insufficient documentation

## 2023-02-01 DIAGNOSIS — R6521 Severe sepsis with septic shock: Secondary | ICD-10-CM

## 2023-02-01 DIAGNOSIS — E119 Type 2 diabetes mellitus without complications: Secondary | ICD-10-CM

## 2023-02-01 DIAGNOSIS — S36039A Unspecified laceration of spleen, initial encounter: Secondary | ICD-10-CM

## 2023-02-01 LAB — URINE CULTURE: Culture: >=100000 — AB

## 2023-02-01 LAB — CBC
HCT: 29.7 % — ABNORMAL LOW (ref 36.0–46.0)
Hemoglobin: 9.7 g/dL — ABNORMAL LOW (ref 12.0–15.0)
MCH: 29.1 pg (ref 26.0–34.0)
MCHC: 32.7 g/dL (ref 30.0–36.0)
MCV: 89.2 fL (ref 80.0–100.0)
Platelets: 162 10*3/uL (ref 150–400)
RBC: 3.33 MIL/uL — ABNORMAL LOW (ref 3.87–5.11)
RDW: 15.5 % (ref 11.5–15.5)
WBC: 12.5 10*3/uL — ABNORMAL HIGH (ref 4.0–10.5)
nRBC: 0 % (ref 0.0–0.2)

## 2023-02-01 LAB — CULTURE, BLOOD (ROUTINE X 2)
Culture: NO GROWTH
Special Requests: ADEQUATE

## 2023-02-01 LAB — BASIC METABOLIC PANEL
Anion gap: 7 (ref 5–15)
BUN: 28 mg/dL — ABNORMAL HIGH (ref 8–23)
CO2: 29 mmol/L (ref 22–32)
Calcium: 7.9 mg/dL — ABNORMAL LOW (ref 8.9–10.3)
Chloride: 102 mmol/L (ref 98–111)
Creatinine, Ser: 1.02 mg/dL — ABNORMAL HIGH (ref 0.44–1.00)
GFR, Estimated: 57 mL/min — ABNORMAL LOW (ref >60)
Glucose, Bld: 141 mg/dL — ABNORMAL HIGH (ref 70–99)
Potassium: 3.6 mmol/L (ref 3.5–5.1)
Sodium: 138 mmol/L (ref 135–145)

## 2023-02-01 LAB — GLUCOSE, CAPILLARY
Glucose-Capillary: 135 mg/dL — ABNORMAL HIGH (ref 70–99)
Glucose-Capillary: 146 mg/dL — ABNORMAL HIGH (ref 70–99)
Glucose-Capillary: 153 mg/dL — ABNORMAL HIGH (ref 70–99)
Glucose-Capillary: 165 mg/dL — ABNORMAL HIGH (ref 70–99)
Glucose-Capillary: 195 mg/dL — ABNORMAL HIGH (ref 70–99)

## 2023-02-01 MED ORDER — INSULIN ASPART 100 UNIT/ML IJ SOLN
0.0000 [IU] | Freq: Three times a day (TID) | INTRAMUSCULAR | Status: DC
  Administered 2023-02-01: 2 [IU] via SUBCUTANEOUS
  Administered 2023-02-01 – 2023-02-02 (×2): 1 [IU] via SUBCUTANEOUS
  Administered 2023-02-02: 2 [IU] via SUBCUTANEOUS
  Administered 2023-02-02: 3 [IU] via SUBCUTANEOUS
  Administered 2023-02-03 (×2): 1 [IU] via SUBCUTANEOUS
  Administered 2023-02-05: 2 [IU] via SUBCUTANEOUS
  Administered 2023-02-05: 1 [IU] via SUBCUTANEOUS
  Administered 2023-02-05: 2 [IU] via SUBCUTANEOUS
  Administered 2023-02-06: 1 [IU] via SUBCUTANEOUS
  Administered 2023-02-06: 2 [IU] via SUBCUTANEOUS
  Administered 2023-02-06: 1 [IU] via SUBCUTANEOUS
  Administered 2023-02-07: 5 [IU] via SUBCUTANEOUS

## 2023-02-01 MED ORDER — SODIUM CHLORIDE 0.9 % IV SOLN
2.0000 g | Freq: Two times a day (BID) | INTRAVENOUS | Status: DC
  Administered 2023-02-01 – 2023-02-02 (×3): 2 g via INTRAVENOUS
  Filled 2023-02-01 (×4): qty 40

## 2023-02-01 MED ORDER — INSULIN ASPART 100 UNIT/ML IJ SOLN
0.0000 [IU] | Freq: Every day | INTRAMUSCULAR | Status: DC
  Administered 2023-02-03 – 2023-02-04 (×2): 2 [IU] via SUBCUTANEOUS
  Administered 2023-02-05: 4 [IU] via SUBCUTANEOUS

## 2023-02-01 NOTE — Assessment & Plan Note (Signed)
-   No signs/symptoms of exacerbation - Continue DuoNebs PRN

## 2023-02-01 NOTE — Assessment & Plan Note (Signed)
Repleted. °

## 2023-02-01 NOTE — Progress Notes (Signed)
Pharmacy Antibiotic Note  [name] is a 77 y.o. female admitted on 01/28/2023 with bilateral acute pyelonephritis with ESBL E. coli bacteremia.  Pharmacy has been consulted for meropenem dosing. Renal function improving, SCr down to 1.02.  Plan: Adjust meropenem to 2g IV every 12 hours Monitor clinical progress, renal function   Weight: 84.7 kg (186 lb 11.7 oz)  Temp (24hrs), Avg:98.2 F (36.8 C), Min:97.5 F (36.4 C), Max:98.6 F (37 C)  Recent Labs  Lab 01/28/23 1922 01/29/23 0146 01/29/23 0343 01/29/23 0403 01/29/23 0620 01/29/23 1428 01/30/23 0514 01/30/23 0754 01/31/23 0449 02/01/23 0520  WBC  --    < > 8.9  --  22.5*  --  30.4*  --  22.4* 12.5*  CREATININE  --    < > 1.49*  --   --  1.68*  --  1.46* 1.11* 1.02*  LATICACIDVEN 1.7  --   --  3.3* 2.5*  --   --   --   --   --    < > = values in this interval not displayed.    Estimated Creatinine Clearance: 48.4 mL/min (A) (by C-G formula based on SCr of 1.02 mg/dL (H)).    Allergies  Allergen Reactions   Alendronate Other (See Comments)    Muscle weakness  **Fosamax**   Valsartan Nausea And Vomiting    Antimicrobials this admission: 9/22 Rocephin x 1 9/23 vanc >> 9/23 9/23 Merrem >> (10/6)  Dose adjustments this admission: 9/26: adjusted Merrem  Microbiology results: 9/22 BCx: E coli 9/23 UCx: > 100K Ecoli, > 100K pseudomonas  9/22 COV/flu/RSV: negative  9/23 MRSA PCR: positive   Thank you for allowing pharmacy to be a part of this patient's care.   Signe Colt, PharmD 02/01/2023 10:40 AM  **Pharmacist phone directory can be found on [email or web address] listed under [facility]**

## 2023-02-01 NOTE — Assessment & Plan Note (Signed)
-   Attributed to multiple electrolyte derangements -Now remains NSR

## 2023-02-01 NOTE — Assessment & Plan Note (Signed)
-   per CT: "Subacute partially healed displaced fractures of the right posterolateral seventh and eighth ribs."

## 2023-02-01 NOTE — Plan of Care (Signed)

## 2023-02-01 NOTE — Assessment & Plan Note (Addendum)
-   Diet has been advanced to dysphagia 3 - A1c 8.2% on 11/08/22 - Continue SSI

## 2023-02-01 NOTE — Assessment & Plan Note (Addendum)
-   initial WBC 15.7 then became neutropenic followed by brisk leukocytosis (no steroids given) - lactic peaked at 3.3 and then downtrended -Blood cultures growing E. coli ESBL in 2/4 bottles (1 set) from 9/22; Urine culture growing same plus Pseudomonas  -Continue meropenem; see bacteremia -Weaning midodrine

## 2023-02-01 NOTE — Assessment & Plan Note (Signed)
Continue Lipitor and Zetia

## 2023-02-01 NOTE — Assessment & Plan Note (Signed)
-   Hypotensive on admission requiring pressor support - Antihypertensives still on hold - Remains on midodrine, will taper

## 2023-02-01 NOTE — Assessment & Plan Note (Signed)
-   See septic shock - Continue meropenem

## 2023-02-01 NOTE — Progress Notes (Signed)
Carelink Summary Report / Loop Recorder 

## 2023-02-01 NOTE — Assessment & Plan Note (Signed)
-   Patient states wheelchair-bound/bedbound status for several years -Follow-up PT/OT evals - Resides at [facility]

## 2023-02-01 NOTE — Assessment & Plan Note (Signed)
repleted ?

## 2023-02-01 NOTE — Assessment & Plan Note (Signed)
-   Continue wound care to buttocks/sacrum

## 2023-02-01 NOTE — Progress Notes (Signed)
Progress Note    [name]   QMV:[id number]  DOB: 1946/02/16  DOA: 01/28/2023     4 PCP: Paulina Fusi, MD  Initial CC: AMS  Hospital Course: [name] is a 77 yo female with PMH DM II, HTN, HLD, COPD, HFpEF who initially presented to the ER on 01/28/2023 with increased confusion for approximately 3 days.  She presented from [place].  On workup she was found to be mildly hypotensive, afebrile with mild leukocytosis, 15.7. Infectious workup was commenced.  CXR was relatively unremarkable with area of pleural thickening noted at periphery of right lung base. CT abdomen/pelvis showed hypoenhancement of both kidneys concerning for pyelonephritis and mild urothelial thickening and renal pelves and proximal ureters concerning for ascending UTI; no bladder wall thickening. She developed progressively worsening hypotension requiring vasopressor support and was admitted to the ICU.  Managed on vancomycin and meropenem.  Interval History:  No events overnight.  Mostly complaining of ongoing weakness.  Understands being treated for bacteremia and pyelonephritis. Blood pressure has improved although still low/normal at times.  Remains on midodrine.  Assessment and Plan: * Septic shock (HCC)-resolved as of 02/01/2023 - initial WBC 15.7 then became neutropenic followed by brisk leukocytosis (no steroids given) - lactic peaked at 3.3 and then downtrended -Blood cultures growing E. coli ESBL in 2/4 bottles (1 set) from 9/22; Urine culture growing same plus Pseudomonas  -Continue meropenem; plan was 14 day course per ICU note -Weaning midodrine  Acute pyelonephritis - See septic shock - Continue meropenem  Gram-negative bacteremia Blood cultures growing E. coli ESBL in 2/4 bottles (1 set) from 9/22; Urine culture growing same plus Pseudomonas  -Continue meropenem; plan was 14 day course per ICU note  Acute renal failure superimposed on stage 3a chronic kidney disease (HCC)-resolved as of  02/01/2023 - patient has history of CKD3a. Baseline creat ~ 1., eGFR~ 57 - patient presents with increase in creat >0.3 mg/dL above baseline or creat increase >1.5x baseline presumed to have occurred within past 7 days PTA -Creatinine 1.67 on admission - improved with IVF   Splenic laceration - per CT 9/22: "1.9 cm subcapsular linear hypodensity in the posterolateral midportion of the spleen. Grossly this has the appearance of a grade 2 laceration, but since there is no perisplenic or subcapsular hematoma or evidence of a free hemorrhage, this is most likely a late-subacute or otherwise contained injury." - Hgb stable, 9.7 g/dL  Wide-complex tachycardia-resolved as of 02/01/2023 - Attributed to multiple electrolyte derangements -Now remains NSR  Bedbound - Patient states wheelchair-bound/bedbound status for several years -Follow-up PT/OT evals - Resides at [facility]  Right 7-8th rib fractures - per CT: "Subacute partially healed displaced fractures of the right posterolateral seventh and eighth ribs."  Chronic diastolic congestive heart failure (HCC) - no s/s exac -Last echo 11/08/2022: EF 65 to 70%, grade 2 diastolic dysfunction.  No RWMA, mild LVH  Hypertension - Hypotensive on admission requiring pressor support - Antihypertensives still on hold - Remains on midodrine, will taper  Dysphagia - Evaluated by speech therapy, appreciate assistance - Continue dysphagia 3 diet  Constipation - Continue laxatives  DMII (diabetes mellitus, type 2) (HCC) - Diet has been advanced to dysphagia 3 - A1c 8.2% on 11/08/22 - Change sliding scale to 3 times daily AC  Hypocalcemia - repleted  Hypomagnesemia - Repleted  Hypophosphatemia - Repleted  Pressure injury of skin - Continue wound care to buttocks/sacrum  Hypokalemia - Repleted  COPD (chronic obstructive pulmonary disease) (HCC) -  No signs/symptoms of exacerbation - Continue DuoNebs PRN  HLD (hyperlipidemia) -  Continue Lipitor and Zetia   Old records reviewed in assessment of this patient  Antimicrobials: Rocephin 01/28/2023 x 1 Vancomycin 01/29/2023 >> 01/29/2023 Meropenem 01/29/2023 >> current  DVT prophylaxis:  heparin injection 5,000 Units Start: 01/28/23 2245   Code Status:   Code Status: Limited: Do not attempt resuscitation (DNR) -DNR-LIMITED -Do Not Intubate/DNI   Mobility Assessment (Last 72 Hours)     Mobility Assessment     Row Name 02/01/23 0900 02/01/23 0742 01/31/23 1940 01/31/23 1800 01/31/23 1004   Does patient have an order for bedrest or is patient medically unstable No - Continue assessment No - Continue assessment No - Continue assessment No - Continue assessment --   What is the highest level of mobility based on the progressive mobility assessment? Level 2 (Chairfast) - Balance while sitting on edge of bed and cannot stand Level 2 (Chairfast) - Balance while sitting on edge of bed and cannot stand Level 2 (Chairfast) - Balance while sitting on edge of bed and cannot stand Level 2 (Chairfast) - Balance while sitting on edge of bed and cannot stand Level 2 (Chairfast) - Balance while sitting on edge of bed and cannot stand   Is the above level different from baseline mobility prior to current illness? Yes - Recommend PT order Yes - Recommend PT order Yes - Recommend PT order Yes - Recommend PT order --    Row Name 01/30/23 2000 01/30/23 1258 01/30/23 1059       What is the highest level of mobility based on the progressive mobility assessment? Level 2 (Chairfast) - Balance while sitting on edge of bed and cannot stand Level 2 (Chairfast) - Balance while sitting on edge of bed and cannot stand Level 2 (Chairfast) - Balance while sitting on edge of bed and cannot stand     Is the above level different from baseline mobility prior to current illness? Yes - Recommend PT order -- --              Barriers to discharge: none Disposition Plan:  [place] Status is:  Inpt  Objective: Blood pressure (!) 127/57, pulse 63, temperature 98.6 F (37 C), temperature source Oral, resp. rate 18, weight 84.7 kg, SpO2 95%.  Examination:  Physical Exam Constitutional:      Comments: Chronically ill appearing, elderly woman laying in bed in NAD  HENT:     Head: Normocephalic and atraumatic.     Mouth/Throat:     Mouth: Mucous membranes are moist.  Eyes:     Extraocular Movements: Extraocular movements intact.  Cardiovascular:     Rate and Rhythm: Normal rate and regular rhythm.  Pulmonary:     Effort: Pulmonary effort is normal. No respiratory distress.     Breath sounds: Normal breath sounds. No wheezing.  Abdominal:     General: Bowel sounds are normal. There is no distension.     Palpations: Abdomen is soft.     Tenderness: There is no abdominal tenderness.  Musculoskeletal:        General: Normal range of motion.     Cervical back: Normal range of motion and neck supple.  Skin:    General: Skin is warm and dry.  Neurological:     Mental Status: She is alert.     Comments: Diffuse weakness, worse in LE bilaterally; UE strength is 4/5  Psychiatric:        Mood and Affect: Mood  normal.      Consultants:    Procedures:    Data Reviewed: Results for orders placed or performed during the hospital encounter of 01/28/23 (from the past 24 hour(s))  Glucose, capillary     Status: Abnormal   Collection Time: 01/31/23  4:33 PM  Result Value Ref Range   Glucose-Capillary 113 (H) 70 - 99 mg/dL  Glucose, capillary     Status: Abnormal   Collection Time: 01/31/23  8:29 PM  Result Value Ref Range   Glucose-Capillary 144 (H) 70 - 99 mg/dL  Glucose, capillary     Status: Abnormal   Collection Time: 01/31/23 11:59 PM  Result Value Ref Range   Glucose-Capillary 195 (H) 70 - 99 mg/dL  Glucose, capillary     Status: Abnormal   Collection Time: 02/01/23  3:52 AM  Result Value Ref Range   Glucose-Capillary 135 (H) 70 - 99 mg/dL  Basic metabolic panel      Status: Abnormal   Collection Time: 02/01/23  5:20 AM  Result Value Ref Range   Sodium 138 135 - 145 mmol/L   Potassium 3.6 3.5 - 5.1 mmol/L   Chloride 102 98 - 111 mmol/L   CO2 29 22 - 32 mmol/L   Glucose, Bld 141 (H) 70 - 99 mg/dL   BUN 28 (H) 8 - 23 mg/dL   Creatinine, Ser 7.84 (H) 0.44 - 1.00 mg/dL   Calcium 7.9 (L) 8.9 - 10.3 mg/dL   GFR, Estimated 57 (L) >60 mL/min   Anion gap 7 5 - 15  CBC     Status: Abnormal   Collection Time: 02/01/23  5:20 AM  Result Value Ref Range   WBC 12.5 (H) 4.0 - 10.5 K/uL   RBC 3.33 (L) 3.87 - 5.11 MIL/uL   Hemoglobin 9.7 (L) 12.0 - 15.0 g/dL   HCT 69.6 (L) 29.5 - 28.4 %   MCV 89.2 80.0 - 100.0 fL   MCH 29.1 26.0 - 34.0 pg   MCHC 32.7 30.0 - 36.0 g/dL   RDW 13.2 44.0 - 10.2 %   Platelets 162 150 - 400 K/uL   nRBC 0.0 0.0 - 0.2 %  Glucose, capillary     Status: Abnormal   Collection Time: 02/01/23 11:48 AM  Result Value Ref Range   Glucose-Capillary 146 (H) 70 - 99 mg/dL    I have reviewed pertinent nursing notes, vitals, labs, and images as necessary. I have ordered labwork to follow up on as indicated.  I have reviewed the last notes from staff over past 24 hours. I have discussed patient's care plan and test results with nursing staff, CM/SW, and other staff as appropriate.  Time spent: Greater than 50% of the 55 minute visit was spent in counseling/coordination of care for the patient as laid out in the A&P.   LOS: 4 days   Lewie Chamber, MD [facility] 02/01/2023, 1:52 PM

## 2023-02-01 NOTE — Assessment & Plan Note (Addendum)
-   per CT 9/22: "1.9 cm subcapsular linear hypodensity in the posterolateral midportion of the spleen. Grossly this has the appearance of a grade 2 laceration, but since there is no perisplenic or subcapsular hematoma or evidence of a free hemorrhage, this is most likely a late-subacute or otherwise contained injury." - Hgb stable

## 2023-02-01 NOTE — Hospital Course (Addendum)
[name] is a 77 yo female with PMH DM II, HTN, HLD, COPD, HFpEF who initially presented to the ER on 01/28/2023 with increased confusion for approximately 3 days.  She presented from [place].  On workup she was found to be mildly hypotensive, afebrile with mild leukocytosis, 15.7. Infectious workup was commenced.  CXR was relatively unremarkable with area of pleural thickening noted at periphery of right lung base. CT abdomen/pelvis showed hypoenhancement of both kidneys concerning for pyelonephritis and mild urothelial thickening and renal pelves and proximal ureters concerning for ascending UTI; no bladder wall thickening. She developed progressively worsening hypotension requiring vasopressor support and was admitted to the ICU.  Managed on meropenem.

## 2023-02-01 NOTE — Assessment & Plan Note (Signed)
Repleted.

## 2023-02-01 NOTE — Assessment & Plan Note (Signed)
-   patient has history of CKD3a. Baseline creat ~ 1., eGFR~ 57 - patient presents with increase in creat >0.3 mg/dL above baseline or creat increase >1.5x baseline presumed to have occurred within past 7 days PTA -Creatinine 1.67 on admission - improved with IVF

## 2023-02-01 NOTE — Assessment & Plan Note (Addendum)
-   Evaluated by speech therapy, appreciate assistance - Continue dysphagia 3 diet - she has been taking in very little liquids due to not liking the nectar thick liquids and nutrition is starting to suffer; will allow her a trial of thin liquids as she says she does this at her facility but if overt aspiration well change back or have further discussions with her

## 2023-02-01 NOTE — Assessment & Plan Note (Addendum)
Blood cultures growing E. coli ESBL in 2/4 bottles (1 set) from 9/22; Urine culture growing same plus Pseudomonas  -Continue meropenem; plan was 14 day course per ICU note, should be okay for completing 10 days course; currently Day 6/10 (complete on 10/2 then can d/c back to [facility])

## 2023-02-01 NOTE — Assessment & Plan Note (Signed)
-  Continue laxatives

## 2023-02-01 NOTE — Assessment & Plan Note (Signed)
-   no s/s exac -Last echo 11/08/2022: EF 65 to 70%, grade 2 diastolic dysfunction.  No RWMA, mild LVH

## 2023-02-02 DIAGNOSIS — N10 Acute tubulo-interstitial nephritis: Secondary | ICD-10-CM | POA: Diagnosis not present

## 2023-02-02 DIAGNOSIS — R6521 Severe sepsis with septic shock: Secondary | ICD-10-CM | POA: Diagnosis not present

## 2023-02-02 DIAGNOSIS — A419 Sepsis, unspecified organism: Secondary | ICD-10-CM | POA: Diagnosis not present

## 2023-02-02 DIAGNOSIS — N179 Acute kidney failure, unspecified: Secondary | ICD-10-CM | POA: Diagnosis not present

## 2023-02-02 LAB — BASIC METABOLIC PANEL
Anion gap: 7 (ref 5–15)
BUN: 19 mg/dL (ref 8–23)
CO2: 27 mmol/L (ref 22–32)
Calcium: 7.8 mg/dL — ABNORMAL LOW (ref 8.9–10.3)
Chloride: 106 mmol/L (ref 98–111)
Creatinine, Ser: 1.23 mg/dL — ABNORMAL HIGH (ref 0.44–1.00)
GFR, Estimated: 46 mL/min — ABNORMAL LOW (ref >60)
Glucose, Bld: 206 mg/dL — ABNORMAL HIGH (ref 70–99)
Potassium: 3.4 mmol/L — ABNORMAL LOW (ref 3.5–5.1)
Sodium: 140 mmol/L (ref 135–145)

## 2023-02-02 LAB — CBC WITH DIFFERENTIAL/PLATELET
Abs Immature Granulocytes: 0.1 10*3/uL — ABNORMAL HIGH (ref 0.00–0.07)
Basophils Absolute: 0 10*3/uL (ref 0.0–0.1)
Basophils Relative: 0 %
Eosinophils Absolute: 0.3 10*3/uL (ref 0.0–0.5)
Eosinophils Relative: 3 %
HCT: 30.3 % — ABNORMAL LOW (ref 36.0–46.0)
Hemoglobin: 9.8 g/dL — ABNORMAL LOW (ref 12.0–15.0)
Immature Granulocytes: 1 %
Lymphocytes Relative: 19 %
Lymphs Abs: 2.2 10*3/uL (ref 0.7–4.0)
MCH: 29.3 pg (ref 26.0–34.0)
MCHC: 32.3 g/dL (ref 30.0–36.0)
MCV: 90.4 fL (ref 80.0–100.0)
Monocytes Absolute: 1 10*3/uL (ref 0.1–1.0)
Monocytes Relative: 8 %
Neutro Abs: 8 10*3/uL — ABNORMAL HIGH (ref 1.7–7.7)
Neutrophils Relative %: 69 %
Platelets: 203 10*3/uL (ref 150–400)
RBC: 3.35 MIL/uL — ABNORMAL LOW (ref 3.87–5.11)
RDW: 15.6 % — ABNORMAL HIGH (ref 11.5–15.5)
Smear Review: NORMAL
WBC: 11.7 10*3/uL — ABNORMAL HIGH (ref 4.0–10.5)
nRBC: 0 % (ref 0.0–0.2)

## 2023-02-02 LAB — GLUCOSE, CAPILLARY
Glucose-Capillary: 183 mg/dL — ABNORMAL HIGH (ref 70–99)
Glucose-Capillary: 173 mg/dL — ABNORMAL HIGH (ref 70–99)
Glucose-Capillary: 206 mg/dL — ABNORMAL HIGH (ref 70–99)
Glucose-Capillary: 151 mg/dL — ABNORMAL HIGH (ref 70–99)
Glucose-Capillary: 147 mg/dL — ABNORMAL HIGH (ref 70–99)

## 2023-02-02 LAB — URINE CULTURE

## 2023-02-02 LAB — MAGNESIUM: Magnesium: 2 mg/dL (ref 1.7–2.4)

## 2023-02-02 MED ORDER — MIDODRINE HCL 5 MG PO TABS
5.0000 mg | ORAL_TABLET | Freq: Three times a day (TID) | ORAL | Status: AC
  Administered 2023-02-02 – 2023-02-04 (×6): 5 mg via ORAL
  Filled 2023-02-02 (×6): qty 1

## 2023-02-02 MED ORDER — SODIUM CHLORIDE 0.9 % IV SOLN
1.0000 g | Freq: Two times a day (BID) | INTRAVENOUS | Status: DC
  Administered 2023-02-03 (×2): 1 g via INTRAVENOUS
  Filled 2023-02-02 (×2): qty 20

## 2023-02-02 NOTE — Progress Notes (Addendum)
Speech Language Pathology Treatment: Dysphagia  Patient Details Name: [name] MRN: [id number] DOB: 1946/02/23 Today's Date: 02/02/2023 Time: 4098-1191 SLP Time Calculation (min) (ACUTE ONLY): 10 min  Assessment / Plan / Recommendation Clinical Impression  Pt refusing to drink nectar thick liquids. Attempted to review MBS images with pt showing silent aspiration of thin liquids, although pt adamant that aspiration is not occurring since she does not cough. Two cups of regular ice water present on pt's tray, with pt reporting she drank with no concerns. Discussed options regarding Free Water Protocol and use of compensatory strategies. Pt with significantly limited participation in session this date, only agreeable to trial single bite of fruit cup with no overt s/s of dysphagia. Recommend continuing current diet of Dys 3 textures and nectar thick liquids, although with adherence to the Free Water Protocol between meals. Recommend ongoing discussions with pt's family regarding compliance with diet recommendation as well as potential for liberalizing diet with known risk. Will continue to follow.    HPI HPI: [name] is a 77 yo female presenting to ED 9/22 with three day history of AMS. CTH unremarkable. AMS thought to be secondary to sepsis. Recently admitted for syncopal episode resulting in rib fx and seen by SLP at that time with prolonged mastication and was recommended a regular diet with thin liquids. SLP at [facility] reports pt coughing intermittently with thin liquids and mixed consistency solids, which appears to resolve with use of a L head turn. SLP recommended an OP MBS, which was unable to be completed prior to current admission. PMH includes T2DM, HTN, HLD, RLS, HFpEF, COPD      SLP Plan  Continue with current plan of care      Recommendations for follow up therapy are one component of a multi-disciplinary discharge planning process, led by the attending physician.   Recommendations may be updated based on patient status, additional functional criteria and insurance authorization.    Recommendations  Diet recommendations: Dysphagia 3 (mechanical soft);Nectar-thick liquid Liquids provided via: Straw;Cup Medication Administration: Crushed with puree Supervision: Staff to assist with self feeding;Full supervision/cueing for compensatory strategies Compensations: Slow rate;Small sips/bites Postural Changes and/or Swallow Maneuvers: Seated upright 90 degrees;Upright 30-60 min after meal                  Oral care QID;Staff/trained caregiver to provide oral care   Frequent or constant Supervision/Assistance Dysphagia, oropharyngeal phase (R13.12)     Continue with current plan of care     Gwynneth Aliment, M.A., CF-SLP Speech Language Pathology, Acute Rehabilitation Services  Secure Chat preferred [phone]   02/02/2023, 10:41 AM

## 2023-02-02 NOTE — Progress Notes (Signed)
Discontinued foley cath as per MD order.

## 2023-02-02 NOTE — Progress Notes (Signed)
Progress Note    [name]   BJY:[id number]  DOB: Aug 30, 1945  DOA: 01/28/2023     5 PCP: Paulina Fusi, MD  Initial CC: AMS  Hospital Course: [name] is a 77 yo female with PMH DM II, HTN, HLD, COPD, HFpEF who initially presented to the ER on 01/28/2023 with increased confusion for approximately 3 days.  She presented from [place].  On workup she was found to be mildly hypotensive, afebrile with mild leukocytosis, 15.7. Infectious workup was commenced.  CXR was relatively unremarkable with area of pleural thickening noted at periphery of right lung base. CT abdomen/pelvis showed hypoenhancement of both kidneys concerning for pyelonephritis and mild urothelial thickening and renal pelves and proximal ureters concerning for ascending UTI; no bladder wall thickening. She developed progressively worsening hypotension requiring vasopressor support and was admitted to the ICU.  Managed on vancomycin and meropenem.  Interval History:  No events overnight. No changes from yesterday. Not a fan of the nectar thickening; other than that no complaints.   Assessment and Plan: * Septic shock (HCC)-resolved as of 02/01/2023 - initial WBC 15.7 then became neutropenic followed by brisk leukocytosis (no steroids given) - lactic peaked at 3.3 and then downtrended -Blood cultures growing E. coli ESBL in 2/4 bottles (1 set) from 9/22; Urine culture growing same plus Pseudomonas  -Continue meropenem; see bacteremia -Weaning midodrine  Acute pyelonephritis - See septic shock - Continue meropenem  Gram-negative bacteremia Blood cultures growing E. coli ESBL in 2/4 bottles (1 set) from 9/22; Urine culture growing same plus Pseudomonas  -Continue meropenem; plan was 14 day course per ICU note, should be okay for completing 10 days course; currently Day 5/10 (complete on 10/2 then can d/c back to [facility])  Acute renal failure superimposed on stage 3a chronic kidney disease (HCC)-resolved as of  02/01/2023 - patient has history of CKD3a. Baseline creat ~ 1., eGFR~ 57 - patient presents with increase in creat >0.3 mg/dL above baseline or creat increase >1.5x baseline presumed to have occurred within past 7 days PTA -Creatinine 1.67 on admission - improved with IVF   Splenic laceration - per CT 9/22: "1.9 cm subcapsular linear hypodensity in the posterolateral midportion of the spleen. Grossly this has the appearance of a grade 2 laceration, but since there is no perisplenic or subcapsular hematoma or evidence of a free hemorrhage, this is most likely a late-subacute or otherwise contained injury." - Hgb stable, 9.7 g/dL  Wide-complex tachycardia-resolved as of 02/01/2023 - Attributed to multiple electrolyte derangements -Now remains NSR  Bedbound - Patient states wheelchair-bound/bedbound status for several years -Follow-up PT/OT evals - Resides at [facility]  Right 7-8th rib fractures - per CT: "Subacute partially healed displaced fractures of the right posterolateral seventh and eighth ribs."  Chronic diastolic congestive heart failure (HCC) - no s/s exac -Last echo 11/08/2022: EF 65 to 70%, grade 2 diastolic dysfunction.  No RWMA, mild LVH  Hypertension - Hypotensive on admission requiring pressor support - Antihypertensives still on hold - Remains on midodrine, will taper  Dysphagia - Evaluated by speech therapy, appreciate assistance - Continue dysphagia 3 diet  Constipation - Continue laxatives  DMII (diabetes mellitus, type 2) (HCC) - Diet has been advanced to dysphagia 3 - A1c 8.2% on 11/08/22 - Continue SSI  Hypocalcemia - repleted  Hypomagnesemia - Repleted  Hypophosphatemia - Repleted  Pressure injury of skin - Continue wound care to buttocks/sacrum  Hypokalemia - Repleted  COPD (chronic obstructive pulmonary disease) (HCC) - No signs/symptoms  of exacerbation - Continue DuoNebs PRN  HLD (hyperlipidemia) - Continue Lipitor and  Zetia   Old records reviewed in assessment of this patient  Antimicrobials: Rocephin 01/28/2023 x 1 Vancomycin 01/29/2023 >> 01/29/2023 Meropenem 01/29/2023 >> current  DVT prophylaxis:  heparin injection 5,000 Units Start: 01/28/23 2245   Code Status:   Code Status: Limited: Do not attempt resuscitation (DNR) -DNR-LIMITED -Do Not Intubate/DNI   Mobility Assessment (Last 72 Hours)     Mobility Assessment     Row Name 02/02/23 1400 02/02/23 0758 02/02/23 0729 02/01/23 1935 02/01/23 0900   Does patient have an order for bedrest or is patient medically unstable -- No - Continue assessment No - Continue assessment No - Continue assessment No - Continue assessment   What is the highest level of mobility based on the progressive mobility assessment? Level 1 (Bedfast) - Unable to balance while sitting on edge of bed Level 2 (Chairfast) - Balance while sitting on edge of bed and cannot stand Level 2 (Chairfast) - Balance while sitting on edge of bed and cannot stand Level 2 (Chairfast) - Balance while sitting on edge of bed and cannot stand Level 2 (Chairfast) - Balance while sitting on edge of bed and cannot stand   Is the above level different from baseline mobility prior to current illness? -- Yes - Recommend PT order Yes - Recommend PT order Yes - Recommend PT order Yes - Recommend PT order    Row Name 02/01/23 0742 01/31/23 1940 01/31/23 1800 01/31/23 1004 01/30/23 2000   Does patient have an order for bedrest or is patient medically unstable No - Continue assessment No - Continue assessment No - Continue assessment -- --   What is the highest level of mobility based on the progressive mobility assessment? Level 2 (Chairfast) - Balance while sitting on edge of bed and cannot stand Level 2 (Chairfast) - Balance while sitting on edge of bed and cannot stand Level 2 (Chairfast) - Balance while sitting on edge of bed and cannot stand Level 2 (Chairfast) - Balance while sitting on edge of bed and  cannot stand Level 2 (Chairfast) - Balance while sitting on edge of bed and cannot stand   Is the above level different from baseline mobility prior to current illness? Yes - Recommend PT order Yes - Recommend PT order Yes - Recommend PT order -- Yes - Recommend PT order            Barriers to discharge: none Disposition Plan:  [place] Status is: Inpt  Objective: Blood pressure 133/61, pulse 70, temperature 98.6 F (37 C), temperature source Oral, resp. rate 17, weight 84.7 kg, SpO2 96%.  Examination:  Physical Exam Constitutional:      Comments: Chronically ill appearing, elderly woman laying in bed in NAD  HENT:     Head: Normocephalic and atraumatic.     Mouth/Throat:     Mouth: Mucous membranes are moist.  Eyes:     Extraocular Movements: Extraocular movements intact.  Cardiovascular:     Rate and Rhythm: Normal rate and regular rhythm.  Pulmonary:     Effort: Pulmonary effort is normal. No respiratory distress.     Breath sounds: Normal breath sounds. No wheezing.  Abdominal:     General: Bowel sounds are normal. There is no distension.     Palpations: Abdomen is soft.     Tenderness: There is no abdominal tenderness.  Musculoskeletal:        General: Normal range of motion.  Cervical back: Normal range of motion and neck supple.  Skin:    General: Skin is warm and dry.  Neurological:     Mental Status: She is alert.     Comments: Diffuse weakness, worse in LE bilaterally; UE strength is 4/5  Psychiatric:        Mood and Affect: Mood normal.      Consultants:    Procedures:    Data Reviewed: Results for orders placed or performed during the hospital encounter of 01/28/23 (from the past 24 hour(s))  Glucose, capillary     Status: Abnormal   Collection Time: 02/01/23  4:53 PM  Result Value Ref Range   Glucose-Capillary 165 (H) 70 - 99 mg/dL  Glucose, capillary     Status: Abnormal   Collection Time: 02/01/23  9:26 PM  Result Value Ref Range    Glucose-Capillary 153 (H) 70 - 99 mg/dL  Glucose, capillary     Status: Abnormal   Collection Time: 02/02/23  6:34 AM  Result Value Ref Range   Glucose-Capillary 183 (H) 70 - 99 mg/dL  Glucose, capillary     Status: Abnormal   Collection Time: 02/02/23  7:57 AM  Result Value Ref Range   Glucose-Capillary 173 (H) 70 - 99 mg/dL  Basic metabolic panel     Status: Abnormal   Collection Time: 02/02/23 10:15 AM  Result Value Ref Range   Sodium 140 135 - 145 mmol/L   Potassium 3.4 (L) 3.5 - 5.1 mmol/L   Chloride 106 98 - 111 mmol/L   CO2 27 22 - 32 mmol/L   Glucose, Bld 206 (H) 70 - 99 mg/dL   BUN 19 8 - 23 mg/dL   Creatinine, Ser 2.95 (H) 0.44 - 1.00 mg/dL   Calcium 7.8 (L) 8.9 - 10.3 mg/dL   GFR, Estimated 46 (L) >60 mL/min   Anion gap 7 5 - 15  CBC with Differential/Platelet     Status: Abnormal   Collection Time: 02/02/23 10:15 AM  Result Value Ref Range   WBC 11.7 (H) 4.0 - 10.5 K/uL   RBC 3.35 (L) 3.87 - 5.11 MIL/uL   Hemoglobin 9.8 (L) 12.0 - 15.0 g/dL   HCT 62.1 (L) 30.8 - 65.7 %   MCV 90.4 80.0 - 100.0 fL   MCH 29.3 26.0 - 34.0 pg   MCHC 32.3 30.0 - 36.0 g/dL   RDW 84.6 (H) 96.2 - 95.2 %   Platelets 203 150 - 400 K/uL   nRBC 0.0 0.0 - 0.2 %   Neutrophils Relative % 69 %   Neutro Abs 8.0 (H) 1.7 - 7.7 K/uL   Lymphocytes Relative 19 %   Lymphs Abs 2.2 0.7 - 4.0 K/uL   Monocytes Relative 8 %   Monocytes Absolute 1.0 0.1 - 1.0 K/uL   Eosinophils Relative 3 %   Eosinophils Absolute 0.3 0.0 - 0.5 K/uL   Basophils Relative 0 %   Basophils Absolute 0.0 0.0 - 0.1 K/uL   WBC Morphology MORPHOLOGY UNREMARKABLE    RBC Morphology MORPHOLOGY UNREMARKABLE    Smear Review Normal platelet morphology    Immature Granulocytes 1 %   Abs Immature Granulocytes 0.10 (H) 0.00 - 0.07 K/uL  Magnesium     Status: None   Collection Time: 02/02/23 10:15 AM  Result Value Ref Range   Magnesium 2.0 1.7 - 2.4 mg/dL  Glucose, capillary     Status: Abnormal   Collection Time: 02/02/23 11:32 AM   Result Value Ref Range  Glucose-Capillary 206 (H) 70 - 99 mg/dL    I have reviewed pertinent nursing notes, vitals, labs, and images as necessary. I have ordered labwork to follow up on as indicated.  I have reviewed the last notes from staff over past 24 hours. I have discussed patient's care plan and test results with nursing staff, CM/SW, and other staff as appropriate.  Time spent: Greater than 50% of the 55 minute visit was spent in counseling/coordination of care for the patient as laid out in the A&P.   LOS: 5 days   Lewie Chamber, MD [facility] 02/02/2023, 3:27 PM

## 2023-02-02 NOTE — Progress Notes (Signed)
Physical Therapy Treatment Patient Details Name: [name] MRN: [id number] DOB: 08-05-1945 Today's Date: 02/02/2023   History of Present Illness This is a 77 year old female presented with AMS the last 2-3 days. Found to have acute pyelonephritis and acute metabolic encephalopathy, septic shock. Splenic laceration on CT possibly from fall on 7/2 when she had rib fractures, hypotension on pressors. PHMx: T2DM, HTN, HLD, RLS, HFpEF, COPD, CVA.  Patient [facility] a [facility] since July [facility]24.    PT Comments  Pt received in supine, agreeable to therapy session and with good participation but limited tolerance for bed mobility due to frequent loose stools. Pt needing up to maxA to roll to L side with use of rails and left up on bed pan in supine with HOB elevated to reduce aspiration risk at end of session. Reviewed supine BLE exercises but minimal completion due to diarrhea. RN/NT notified of pt needing hygiene assist and foam dressing due to sacral wounds without dressing donned. RW set up and brought to room for next session plan for transfer training with maxi-move vs RW pending progress. Pt continues to benefit from PT services to progress toward functional mobility goals.     If plan is discharge home, recommend the following: Assistance with cooking/housework;Two people to help with walking and/or transfers;A lot of help with bathing/dressing/bathroom;Direct supervision/assist for medications management;Assist for transportation;Help with stairs or ramp for entrance   Can travel by private vehicle     No  Equipment Recommendations  None recommended by PT (defer to facility)    Recommendations for Other Services       Precautions / Restrictions Precautions Precautions: Fall Precaution Comments: watch BP, bring briefs; Contact precs Restrictions Weight Bearing Restrictions: No     Mobility  Bed Mobility Overal bed mobility: Needs Assistance Bed Mobility: Rolling Rolling:  Max assist, Used rails         General bed mobility comments: Max step-by-step cues for sequencing bringing LE's to her L sie and use of bed rail with cross-body reaching. Pt noted to have new onset loose stools while turning and bed pan retrieved for pt. PTA assisted for clean-up but pt with ongoing BM after clean-up so pt left on bed pan and RN/NT notified she will need to be checked on shortly. Pt unlikely to use call bell properly due to cognitive impairment.    Transfers                   General transfer comment: deferred, pt on abx with multiple loose stools today. chair alarm brought to room and set up in case nursing staff can hoyer her OOB over weekend    Ambulation/Gait                   Stairs             Wheelchair Mobility     Tilt Bed    Modified Rankin (Stroke Patients Only)       Balance Overall balance assessment: Needs assistance     Sitting balance - Comments: UTA today, loose stools                                    Cognition Arousal: Alert Behavior During Therapy: Flat affect Overall Cognitive Status: No family/caregiver present to determine baseline cognitive functioning  General Comments: pt very flat with slow processing and delayed responses to all questions. pt oriented to place, month/year but not date/day, and not to situation. Pt needs dense cues for sequencing/bed mobility, reports she uses briefs (incontinent at baseline). RN and NT notified when pt placed on bed pan as she is unlikely to correctly use call bell to alert staff when she is finished due to evident cognitive impairment. Pt requesting to drink water on bed pan but PTA kept it out of her reach until after RN/NT get her cleaned up due to pt high aspiration risk and need for pt hygiene.        Exercises General Exercises - Lower Extremity Ankle Circles/Pumps: AROM, Both, 10 reps, Supine Heel  Slides: AAROM, Both, 5 reps, Supine Straight Leg Raises: AAROM, Both, Supine (~3 reps, limited due to pt needing bed pan)    General Comments General comments (skin integrity, edema, etc.): pt with significant sacral wound, RN notified she needs foam dressing to cover this after she is done on bed pan, she did not have one on prior to session. per NT, foley recently removed as well. bed alarm on for safety given pt cognitive deficit.      Pertinent Vitals/Pain Pain Assessment Pain Assessment: Faces Faces Pain Scale: Hurts little more Pain Location: peri area and generalized Pain Descriptors / Indicators: Discomfort, Grimacing Pain Intervention(s): Monitored during session, Limited activity within patient's tolerance, Repositioned    Home Living                          Prior Function            PT Goals (current goals can now be found in the care plan section) Acute Rehab PT Goals Patient Stated Goal: regain strength and get better PT Goal Formulation: With patient Time For Goal Achievement: 02/13/23 Progress towards PT goals: Progressing toward goals    Frequency    Min 1X/week      PT Plan      Co-evaluation              AM-PAC PT "6 Clicks" Mobility   Outcome Measure  Help needed turning from your back to your side while in a flat bed without using bedrails?: A Lot Help needed moving from lying on your back to sitting on the side of a flat bed without using bedrails?: Total Help needed moving to and from a bed to a chair (including a wheelchair)?: Total Help needed standing up from a chair using your arms (e.g., wheelchair or bedside chair)?: Total Help needed to walk in hospital room?: Total Help needed climbing 3-5 steps with a railing? : Total 6 Click Score: 7    End of Session Equipment Utilized During Treatment: Oxygen Activity Tolerance: Treatment limited secondary to medical complications (Comment);Patient limited by fatigue;Other  (comment) (diarrhea) Patient left: in bed;with call bell/phone within reach;with bed alarm set;with family/visitor present (on bed pan; HOB elevated >30* due to aspiration risk) Nurse Communication: Mobility status;Need for lift equipment;Other (comment) (needs foam dressing -sacrum, needs cleaned up from bed pan) PT Visit Diagnosis: Unsteadiness on feet (R26.81);Other abnormalities of gait and mobility (R26.89);Muscle weakness (generalized) (M62.81);Difficulty in walking, not elsewhere classified (R26.2)     Time: 1416-1430 PT Time Calculation (min) (ACUTE ONLY): 14 min  Charges:    $Therapeutic Activity: 8-22 mins PT General Charges $$ ACUTE PT VISIT: 1 Visit  Florina Ou., PTA Acute Rehabilitation Services Secure Chat Preferred 9a-5:30pm Office: [phone]    Dorathy Kinsman [facility] 02/02/2023, 3:01 PM

## 2023-02-02 NOTE — Plan of Care (Signed)

## 2023-02-03 DIAGNOSIS — A419 Sepsis, unspecified organism: Secondary | ICD-10-CM | POA: Diagnosis not present

## 2023-02-03 DIAGNOSIS — R338 Other retention of urine: Secondary | ICD-10-CM | POA: Diagnosis not present

## 2023-02-03 DIAGNOSIS — N10 Acute tubulo-interstitial nephritis: Secondary | ICD-10-CM | POA: Diagnosis not present

## 2023-02-03 DIAGNOSIS — N179 Acute kidney failure, unspecified: Secondary | ICD-10-CM | POA: Diagnosis not present

## 2023-02-03 LAB — CBC WITH DIFFERENTIAL/PLATELET
Abs Immature Granulocytes: 0.17 10*3/uL — ABNORMAL HIGH (ref 0.00–0.07)
Basophils Absolute: 0.1 10*3/uL (ref 0.0–0.1)
Basophils Relative: 1 %
Eosinophils Absolute: 0.3 10*3/uL (ref 0.0–0.5)
Eosinophils Relative: 3 %
HCT: 30.6 % — ABNORMAL LOW (ref 36.0–46.0)
Hemoglobin: 9.9 g/dL — ABNORMAL LOW (ref 12.0–15.0)
Immature Granulocytes: 2 %
Lymphocytes Relative: 24 %
Lymphs Abs: 2.6 10*3/uL (ref 0.7–4.0)
MCH: 29.5 pg (ref 26.0–34.0)
MCHC: 32.4 g/dL (ref 30.0–36.0)
MCV: 91.1 fL (ref 80.0–100.0)
Monocytes Absolute: 0.9 10*3/uL (ref 0.1–1.0)
Monocytes Relative: 8 %
Neutro Abs: 7 10*3/uL (ref 1.7–7.7)
Neutrophils Relative %: 62 %
Platelets: 221 10*3/uL (ref 150–400)
RBC: 3.36 MIL/uL — ABNORMAL LOW (ref 3.87–5.11)
RDW: 15.7 % — ABNORMAL HIGH (ref 11.5–15.5)
WBC: 11.1 10*3/uL — ABNORMAL HIGH (ref 4.0–10.5)
nRBC: 0 % (ref 0.0–0.2)

## 2023-02-03 LAB — BASIC METABOLIC PANEL
Anion gap: 6 (ref 5–15)
BUN: 13 mg/dL (ref 8–23)
CO2: 26 mmol/L (ref 22–32)
Calcium: 7.7 mg/dL — ABNORMAL LOW (ref 8.9–10.3)
Chloride: 109 mmol/L (ref 98–111)
Creatinine, Ser: 0.78 mg/dL (ref 0.44–1.00)
GFR, Estimated: >60 mL/min (ref >60)
Glucose, Bld: 121 mg/dL — ABNORMAL HIGH (ref 70–99)
Potassium: 3.5 mmol/L (ref 3.5–5.1)
Sodium: 141 mmol/L (ref 135–145)

## 2023-02-03 LAB — GLUCOSE, CAPILLARY
Glucose-Capillary: 107 mg/dL — ABNORMAL HIGH (ref 70–99)
Glucose-Capillary: 135 mg/dL — ABNORMAL HIGH (ref 70–99)
Glucose-Capillary: 133 mg/dL — ABNORMAL HIGH (ref 70–99)
Glucose-Capillary: 225 mg/dL — ABNORMAL HIGH (ref 70–99)

## 2023-02-03 LAB — MAGNESIUM: Magnesium: 2.1 mg/dL (ref 1.7–2.4)

## 2023-02-03 MED ORDER — SODIUM CHLORIDE 0.9 % IV SOLN
1.0000 g | Freq: Three times a day (TID) | INTRAVENOUS | Status: AC
  Administered 2023-02-03 – 2023-02-07 (×12): 1 g via INTRAVENOUS
  Filled 2023-02-03 (×12): qty 20

## 2023-02-03 NOTE — Consult Note (Signed)
Urology Consult   Reason for consult: urinary retention, difficult foley  History of Present Illness: [name] is a 77 y.o. F currently admitted to [facility] with septic shock, likely GU in origin.   A foley was removed and the patient was not able to void. Nursing staff was not able to replace a catheter    Past Medical History:  Diagnosis Date   Acute pain of left knee    Angina pectoris (HCC) 07/28/2016   With normal coronary arteriography   Carotid stenosis, right 11/18/2018   Cerebral infarction due to stenosis of right carotid artery (HCC) s/p tPA 11/15/2018   CHF (congestive heart failure) (HCC)    Chronic diastolic congestive heart failure (HCC) 07/28/2016   Chronic venous insufficiency 09/27/2021   COPD (chronic obstructive pulmonary disease) (HCC) 01/14/2018   Diabetes mellitus type II, uncontrolled 11/18/2018   Diabetes mellitus without complication (HCC)    Diabetic polyneuropathy associated with type 2 diabetes mellitus (HCC) 11/30/2015   Fall 11/21/2018   Family hx-stroke 11/18/2018   Hyperlipidemia    Hypertension    Hypertensive heart disease with heart failure (HCC) 07/28/2016   Living in nursing home    Lumbar disc narrowing    Onychogryposis of toenail 09/07/2020   Osteoporosis    Overgrown toenails 12/16/2020   Pustules determined by examination 04/11/2018   Spondylosis    lumbosacral   Stroke [facility])     Past Surgical History:  Procedure Laterality Date   ABDOMINAL HYSTERECTOMY     APPENDECTOMY     CARDIAC CATHETERIZATION     CHOLECYSTECTOMY     ENDARTERECTOMY Right 11/22/2018   Procedure: ENDARTERECTOMY CAROTID RIGHT;  Surgeon: Cephus Shelling, MD;  Location: [facility] OR;  Service: Vascular;  Laterality: Right;   HIP SURGERY  07/16/2019   Had to repair a lot of damage   KNEE ARTHROCENTESIS  11/22/2018       LOOP RECORDER INSERTION N/A 11/08/2022   Procedure: LOOP RECORDER INSERTION;  Surgeon: Regan Lemming, MD;  Location: [facility] INVASIVE CV LAB;  Service:  Cardiovascular;  Laterality: N/A;   PATCH ANGIOPLASTY Right 11/22/2018   Procedure: Patch Angioplasty Right Carotid Artery using Xenosure Biologic Patch;  Surgeon: Cephus Shelling, MD;  Location: [facility] OR;  Service: Vascular;  Laterality: Right;     Current Hospital Medications:  Home meds:  No current facility-administered medications on file prior to encounter.   Current Outpatient Medications on File Prior to Encounter  Medication Sig Dispense Refill   acetaminophen (TYLENOL) 650 MG CR tablet Take 650-1,300 mg by mouth every 8 (eight) hours as needed for pain or fever. Do no exceed more than 3000mg  in 24 hours     aspirin 81 MG chewable tablet Chew 81 mg by mouth daily.     atorvastatin (LIPITOR) 80 MG tablet Take 80 mg by mouth at bedtime.      clopidogrel (PLAVIX) 75 MG tablet Take 75 mg by mouth every morning.      ezetimibe (ZETIA) 10 MG tablet Take 10 mg by mouth every morning.      gabapentin (NEURONTIN) 300 MG capsule Take 300 mg by mouth in the morning and at bedtime.     HUMALOG 100 UNIT/ML injection Before each meal 3 times a day, 140-199 - 2 units, 200-250 - 4 units, 251-299 - 6 units,  300-349 - 8 units,  350 or above 10 units. (Patient taking differently: Inject 2-12 Units into the skin 3 (three) times daily before meals. Before each  meal 3 times a day, 201-250- give 2 units, 251-300- give 4 units, 301-350- give 6 units, 351-400- give 8 units, 401--450- give 10 units, 451-600- give 12 units) 10 mL 0   insulin glargine (LANTUS) 100 UNIT/ML injection Inject 0.15 mLs (15 Units total) into the skin 2 (two) times daily. (Patient taking differently: Inject 14 Units into the skin 2 (two) times daily.) 10 mL 0   nitroGLYCERIN (NITROSTAT) 0.4 MG SL tablet Place 1 tablet (0.4 mg total) under the tongue every 5 (five) minutes as needed for chest pain. 30 tablet 3   rOPINIRole (REQUIP) 1 MG tablet Take 1 mg by mouth at bedtime.      solifenacin (VESICARE) 10 MG tablet Take 10 mg by mouth  daily.     torsemide (DEMADEX) 100 MG tablet Take 50 mg by mouth 2 (two) times daily.       Scheduled Meds:  aspirin  81 mg Oral Daily   atorvastatin  80 mg Oral QHS   Chlorhexidine Gluconate Cloth  6 each Topical Daily   clopidogrel  75 mg Oral q morning   ezetimibe  10 mg Oral q morning   Gerhardt's butt cream   Topical BID   heparin  5,000 Units Subcutaneous Q8H   insulin aspart  0-5 Units Subcutaneous QHS   insulin aspart  0-9 Units Subcutaneous TID WC   insulin glargine-yfgn  5 Units Subcutaneous BID   midodrine  5 mg Oral Q8H   mupirocin ointment   Nasal BID   polyethylene glycol  17 g Oral Daily   Continuous Infusions:  meropenem (MERREM) IV 1 g (02/03/23 1225)   PRN Meds:.acetaminophen **OR** acetaminophen, docusate sodium, ipratropium-albuterol, ondansetron **OR** ondansetron (ZOFRAN) IV, mouth rinse, polyethylene glycol, senna-docusate  Allergies:  Allergies  Allergen Reactions   Alendronate Other (See Comments)    Muscle weakness  **Fosamax**   Valsartan Nausea And Vomiting    Family History  Problem Relation Age of Onset   Stroke Mother    Hypertension Mother    Diabetes Mother    Heart disease Mother    Prostate cancer Father    Alcohol abuse Father    CAD Sister    CAD Sister    Valvular heart disease Sister     Social History:  reports that she has never smoked. She has never been exposed to tobacco smoke. She has never used smokeless tobacco. She reports that she does not currently use alcohol. She reports that she does not use drugs.  ROS: A complete review of systems was performed.  All systems are negative except for pertinent findings as noted.  Physical Exam:  Vital signs in last 24 hours: Temp:  [98.2 F (36.8 C)-98.4 F (36.9 C)] 98.2 F (36.8 C) (09/28 0700) Pulse Rate:  [60-68] 60 (09/28 0700) Resp:  [16-18] 16 (09/28 0700) BP: (114-123)/(54-59) 114/54 (09/28 0700) SpO2:  [95 %-100 %] 95 % (09/28 0700) Constitutional:  Alert and  oriented, No acute distress Cardiovascular: Regular rate and rhythm Respiratory: Normal respiratory effort, Lungs clear bilaterally GI: Abdomen is soft, nontender, nondistended, no abdominal masses Neurologic: Grossly intact, no focal deficits Psychiatric: Normal mood and affect  Laboratory Data:  Recent Labs    02/01/23 0520 02/02/23 1015 02/03/23 0826  WBC 12.5* 11.7* 11.1*  HGB 9.7* 9.8* 9.9*  HCT 29.7* 30.3* 30.6*  PLT 162 203 221    Recent Labs    02/01/23 0520 02/02/23 1015 02/03/23 0826  NA 138 140 141  K 3.6 3.4*  3.5  CL 102 106 109  GLUCOSE 141* 206* 121*  BUN 28* 19 13  CALCIUM 7.9* 7.8* 7.7*  CREATININE 1.02* 1.23* 0.78     Results for orders placed or performed during the hospital encounter of 01/28/23 (from the past 24 hour(s))  Glucose, capillary     Status: Abnormal   Collection Time: 02/02/23  4:21 PM  Result Value Ref Range   Glucose-Capillary 151 (H) 70 - 99 mg/dL  Glucose, capillary     Status: Abnormal   Collection Time: 02/02/23  9:14 PM  Result Value Ref Range   Glucose-Capillary 147 (H) 70 - 99 mg/dL  Glucose, capillary     Status: Abnormal   Collection Time: 02/03/23  7:29 AM  Result Value Ref Range   Glucose-Capillary 107 (H) 70 - 99 mg/dL  CBC with Differential/Platelet     Status: Abnormal   Collection Time: 02/03/23  8:26 AM  Result Value Ref Range   WBC 11.1 (H) 4.0 - 10.5 K/uL   RBC 3.36 (L) 3.87 - 5.11 MIL/uL   Hemoglobin 9.9 (L) 12.0 - 15.0 g/dL   HCT 78.2 (L) 95.6 - 21.3 %   MCV 91.1 80.0 - 100.0 fL   MCH 29.5 26.0 - 34.0 pg   MCHC 32.4 30.0 - 36.0 g/dL   RDW 08.6 (H) 57.8 - 46.9 %   Platelets 221 150 - 400 K/uL   nRBC 0.0 0.0 - 0.2 %   Neutrophils Relative % 62 %   Neutro Abs 7.0 1.7 - 7.7 K/uL   Lymphocytes Relative 24 %   Lymphs Abs 2.6 0.7 - 4.0 K/uL   Monocytes Relative 8 %   Monocytes Absolute 0.9 0.1 - 1.0 K/uL   Eosinophils Relative 3 %   Eosinophils Absolute 0.3 0.0 - 0.5 K/uL   Basophils Relative 1 %    Basophils Absolute 0.1 0.0 - 0.1 K/uL   Immature Granulocytes 2 %   Abs Immature Granulocytes 0.17 (H) 0.00 - 0.07 K/uL  Magnesium     Status: None   Collection Time: 02/03/23  8:26 AM  Result Value Ref Range   Magnesium 2.1 1.7 - 2.4 mg/dL  Basic metabolic panel     Status: Abnormal   Collection Time: 02/03/23  8:26 AM  Result Value Ref Range   Sodium 141 135 - 145 mmol/L   Potassium 3.5 3.5 - 5.1 mmol/L   Chloride 109 98 - 111 mmol/L   CO2 26 22 - 32 mmol/L   Glucose, Bld 121 (H) 70 - 99 mg/dL   BUN 13 8 - 23 mg/dL   Creatinine, Ser 6.29 0.44 - 1.00 mg/dL   Calcium 7.7 (L) 8.9 - 10.3 mg/dL   GFR, Estimated >52 >84 mL/min   Anion gap 6 5 - 15  Glucose, capillary     Status: Abnormal   Collection Time: 02/03/23 12:22 PM  Result Value Ref Range   Glucose-Capillary 135 (H) 70 - 99 mg/dL   Recent Results (from the past 240 hour(s))  Culture, blood (routine x 2)     Status: None   Collection Time: 01/28/23  7:02 PM   Specimen: BLOOD RIGHT ARM  Result Value Ref Range Status   Specimen Description BLOOD RIGHT ARM  Final   Special Requests   Final    BOTTLES DRAWN AEROBIC AND ANAEROBIC Blood Culture adequate volume   Culture   Final    NO GROWTH 5 DAYS Performed at [facility], [address] [place],  [place] [address]    Report Status 02/02/2023 FINAL  Final  Resp panel by RT-PCR (RSV, Flu A&B, Covid) Anterior Nasal Swab     Status: None   Collection Time: 01/28/23  7:03 PM   Specimen: Anterior Nasal Swab  Result Value Ref Range Status   SARS Coronavirus 2 by RT PCR NEGATIVE NEGATIVE Final   Influenza A by PCR NEGATIVE NEGATIVE Final   Influenza B by PCR NEGATIVE NEGATIVE Final    Comment: (NOTE) The Xpert Xpress SARS-CoV-2/FLU/RSV plus assay is intended as an aid in the diagnosis of influenza from Nasopharyngeal swab specimens and should not be used as a sole basis for treatment. Nasal washings and aspirates are unacceptable for Xpert Xpress  SARS-CoV-2/FLU/RSV testing.  Fact Sheet for Patients: BloggerCourse.com  Fact Sheet for Healthcare Providers: SeriousBroker.it  This test is not yet approved or cleared by the [place] FDA and has been authorized for detection and/or diagnosis of SARS-CoV-2 by FDA under an Emergency Use Authorization (EUA). This EUA will remain in effect (meaning this test can be used) for the duration of the COVID-19 declaration under Section 564(b)(1) of the Act, 21 U.S.C. section 360bbb-3(b)(1), unless the authorization is terminated or revoked.     Resp Syncytial Virus by PCR NEGATIVE NEGATIVE Final    Comment: (NOTE) Fact Sheet for Patients: BloggerCourse.com  Fact Sheet for Healthcare Providers: SeriousBroker.it  This test is not yet approved or cleared by the [place] FDA and has been authorized for detection and/or diagnosis of SARS-CoV-2 by FDA under an Emergency Use Authorization (EUA). This EUA will remain in effect (meaning this test can be used) for the duration of the COVID-19 declaration under Section 564(b)(1) of the Act, 21 U.S.C. section 360bbb-3(b)(1), unless the authorization is terminated or revoked.  Performed at [facility], [address], [place], [place] [address]   Culture, blood (routine x 2)     Status: Abnormal   Collection Time: 01/28/23  8:19 PM   Specimen: BLOOD LEFT ARM  Result Value Ref Range Status   Specimen Description BLOOD LEFT ARM  Final   Special Requests   Final    BOTTLES DRAWN AEROBIC AND ANAEROBIC Blood Culture adequate volume   Culture  Setup Time   Final    GRAM NEGATIVE RODS IN BOTH AEROBIC AND ANAEROBIC BOTTLES CRITICAL RESULT CALLED TO, READ BACK BY AND VERIFIED WITH: Suzan Slick SINCLAIRE [id number] AT 1131 BY CM Performed at [facility], [address] [place], [place] [address]    Culture (A)  Final    ESCHERICHIA  COLI Confirmed Extended Spectrum Beta-Lactamase Producer (ESBL).  In bloodstream infections from ESBL organisms, carbapenems are preferred over piperacillin/tazobactam. They are shown to have a lower risk of mortality.    Report Status 01/31/2023 FINAL  Final   Organism ID, Bacteria ESCHERICHIA COLI  Final      Susceptibility   Escherichia coli - MIC*    AMPICILLIN >=32 RESISTANT Resistant     CEFEPIME >=32 RESISTANT Resistant     CEFTAZIDIME RESISTANT Resistant     CEFTRIAXONE >=64 RESISTANT Resistant     CIPROFLOXACIN >=4 RESISTANT Resistant     GENTAMICIN >=16 RESISTANT Resistant     IMIPENEM <=0.25 SENSITIVE Sensitive     TRIMETH/SULFA >=320 RESISTANT Resistant     AMPICILLIN/SULBACTAM >=32 RESISTANT Resistant     PIP/TAZO 16 SENSITIVE Sensitive     * ESCHERICHIA COLI  Blood Culture ID Panel (Reflexed)     Status: Abnormal  Collection Time: 01/28/23  8:19 PM  Result Value Ref Range Status   Enterococcus faecalis NOT DETECTED NOT DETECTED Final   Enterococcus Faecium NOT DETECTED NOT DETECTED Final   Listeria monocytogenes NOT DETECTED NOT DETECTED Final   Staphylococcus species NOT DETECTED NOT DETECTED Final   Staphylococcus aureus (BCID) NOT DETECTED NOT DETECTED Final   Staphylococcus epidermidis NOT DETECTED NOT DETECTED Final   Staphylococcus lugdunensis NOT DETECTED NOT DETECTED Final   Streptococcus species NOT DETECTED NOT DETECTED Final   Streptococcus agalactiae NOT DETECTED NOT DETECTED Final   Streptococcus pneumoniae NOT DETECTED NOT DETECTED Final   Streptococcus pyogenes NOT DETECTED NOT DETECTED Final   A.calcoaceticus-baumannii NOT DETECTED NOT DETECTED Final   Bacteroides fragilis NOT DETECTED NOT DETECTED Final   Enterobacterales DETECTED (A) NOT DETECTED Final    Comment: Enterobacterales represent a large order of gram negative bacteria, not a single organism. CRITICAL RESULT CALLED TO, READ BACK BY AND VERIFIED WITH: PHARMD E SINCLAIRE [id number] AT 1131 BY  CM    Enterobacter cloacae complex NOT DETECTED NOT DETECTED Final   Escherichia coli DETECTED (A) NOT DETECTED Final    Comment: CRITICAL RESULT CALLED TO, READ BACK BY AND VERIFIED WITH: PHARMD E SINCLAIRE [id number] AT 1131 BY CM    Klebsiella aerogenes NOT DETECTED NOT DETECTED Final   Klebsiella oxytoca NOT DETECTED NOT DETECTED Final   Klebsiella pneumoniae NOT DETECTED NOT DETECTED Final   Proteus species NOT DETECTED NOT DETECTED Final   Salmonella species NOT DETECTED NOT DETECTED Final   Serratia marcescens NOT DETECTED NOT DETECTED Final   Haemophilus influenzae NOT DETECTED NOT DETECTED Final   Neisseria meningitidis NOT DETECTED NOT DETECTED Final   Pseudomonas aeruginosa NOT DETECTED NOT DETECTED Final   Stenotrophomonas maltophilia NOT DETECTED NOT DETECTED Final   Candida albicans NOT DETECTED NOT DETECTED Final   Candida auris NOT DETECTED NOT DETECTED Final   Candida glabrata NOT DETECTED NOT DETECTED Final   Candida krusei NOT DETECTED NOT DETECTED Final   Candida parapsilosis NOT DETECTED NOT DETECTED Final   Candida tropicalis NOT DETECTED NOT DETECTED Final   Cryptococcus neoformans/gattii NOT DETECTED NOT DETECTED Final   CTX-M ESBL DETECTED (A) NOT DETECTED Final    Comment: CRITICAL RESULT CALLED TO, READ BACK BY AND VERIFIED WITH: PHARMD E SINCLAIRE [id number] AT 1131 BY CM (NOTE) Extended spectrum beta-lactamase detected. Recommend a carbapenem as initial therapy.      Carbapenem resistance IMP NOT DETECTED NOT DETECTED Final   Carbapenem resistance KPC NOT DETECTED NOT DETECTED Final   Carbapenem resistance NDM NOT DETECTED NOT DETECTED Final   Carbapenem resist OXA 48 LIKE NOT DETECTED NOT DETECTED Final   Carbapenem resistance VIM NOT DETECTED NOT DETECTED Final    Comment: Performed at [facility], [address], [place], [place] [address]  Urine Culture (for pregnant, neutropenic or urologic patients or patients with an indwelling urinary  catheter)     Status: Abnormal   Collection Time: 01/29/23  3:43 AM   Specimen: Urine, Catheterized  Result Value Ref Range Status   Specimen Description URINE, CATHETERIZED  Final   Special Requests   Final    NONE Performed at [facility], [address], [place], [place] [address]    Culture (A)  Final    >=100,000 COLONIES/mL ESCHERICHIA COLI Confirmed Extended Spectrum Beta-Lactamase Producer (ESBL).  In bloodstream infections from ESBL organisms, carbapenems are preferred over piperacillin/tazobactam. They are shown to have a lower risk of mortality. >=100,000  COLONIES/mL PSEUDOMONAS AERUGINOSA    Report Status 02/02/2023 FINAL  Final   Organism ID, Bacteria ESCHERICHIA COLI (A)  Final   Organism ID, Bacteria PSEUDOMONAS AERUGINOSA (A)  Final      Susceptibility   Escherichia coli - MIC*    AMPICILLIN >=32 RESISTANT Resistant     CEFAZOLIN >=64 RESISTANT Resistant     CEFEPIME 16 RESISTANT Resistant     CEFTRIAXONE >=64 RESISTANT Resistant     CIPROFLOXACIN >=4 RESISTANT Resistant     GENTAMICIN >=16 RESISTANT Resistant     IMIPENEM <=0.25 SENSITIVE Sensitive     NITROFURANTOIN <=16 SENSITIVE Sensitive     TRIMETH/SULFA >=320 RESISTANT Resistant     AMPICILLIN/SULBACTAM >=32 RESISTANT Resistant     PIP/TAZO 16 SENSITIVE Sensitive     * >=100,000 COLONIES/mL ESCHERICHIA COLI   Pseudomonas aeruginosa - MIC*    CEFTAZIDIME 4 SENSITIVE Sensitive     CIPROFLOXACIN >=4 RESISTANT Resistant     GENTAMICIN >=16 RESISTANT Resistant     IMIPENEM 0.5 SENSITIVE Sensitive     PIP/TAZO 16 SENSITIVE Sensitive     * >=100,000 COLONIES/mL PSEUDOMONAS AERUGINOSA  MRSA Next Gen by PCR, Nasal     Status: Abnormal   Collection Time: 01/29/23  6:00 AM   Specimen: Nasal Mucosa; Nasal Swab  Result Value Ref Range Status   MRSA by PCR Next Gen DETECTED (A) NOT DETECTED Final    Comment: RESULT CALLED TO, READ BACK BY AND VERIFIED WITH: RN REID ON [id number] @0810  BY SM (NOTE) The  GeneXpert MRSA Assay (FDA approved for NASAL specimens only), is one component of a comprehensive MRSA colonization surveillance program. It is not intended to diagnose MRSA infection nor to guide or monitor treatment for MRSA infections. Test performance is not FDA approved in patients less than 63 years old. Performed at [facility], [address], [place], [place] [address]     Renal Function: Recent Labs    01/29/23 0343 01/29/23 1428 01/30/23 0754 01/31/23 0449 02/01/23 0520 02/02/23 1015 02/03/23 0826  CREATININE 1.49* 1.68* 1.46* 1.11* 1.02* 1.23* 0.78   Estimated Creatinine Clearance: 61.7 mL/min (by C-G formula based on SCr of 0.78 mg/dL).  Radiologic Imaging: No results found.  I independently reviewed the above imaging studies.  Procedure note Patient was prepped in the usual sterile fashion.  With a of nursing staff retracting her legs and labia I was able to clearly see the urethra.  I placed a 16 French coud catheter without issue.  There is immediate return of clear yellow urine.  The balloon was inflated with 10 cc and it was connected to the appropriate collection bag.  Impression/Recommendation: 77 yo F with urinary retention. Foley replaced - would leave for at least 3 days then can be discontinued per the needs of the primary team. If needs to be replaced, this can be attempted by nursing staff. Would not use straight cath, and would recommend having help to retract her legs and labia as well as trendelenburg positioning.    Irine Seal 02/03/2023, 1:36 PM  [facility]  Pager: [phone]

## 2023-02-03 NOTE — Assessment & Plan Note (Signed)
-   foley removed 9/27 and patient failed TOV with accumulation on bladder scan - appreciate urology assistance on foley replacement on 9/28 - planning to leave in til at least Tuesday; might reattempt TOV before d/c vs send to SNF with foley and outpatient urology followup

## 2023-02-03 NOTE — Progress Notes (Addendum)
4696- Attempted to straight cath per orders, patient is very swollen and has limited hip mobility to allow better visualization. Sent page to hospitialist.  0500- spoke w/ Dr. Loney Loh. Will relay to oncoming day team on being unable to void, per provider continue to bladder scan.

## 2023-02-03 NOTE — Progress Notes (Signed)
Bladder scan 479 mls. Sent page to hospitalist.

## 2023-02-03 NOTE — Progress Notes (Signed)
Progress Note    [name]   ZHY:[id number]  DOB: [birth year]  DOA: 01/28/2023     6 PCP: Haley Fusi, MD  Initial CC: AMS  Hospital Course: [name] is a 77 yo female with PMH DM II, HTN, HLD, COPD, HFpEF who initially presented to the ER on 01/28/2023 with increased confusion for approximately 3 days.  She presented from [place].  On workup she was found to be mildly hypotensive, afebrile with mild leukocytosis, 15.7. Infectious workup was commenced.  CXR was relatively unremarkable with area of pleural thickening noted at periphery of right lung base. CT abdomen/pelvis showed hypoenhancement of both kidneys concerning for pyelonephritis and mild urothelial thickening and renal pelves and proximal ureters concerning for ascending UTI; no bladder wall thickening. She developed progressively worsening hypotension requiring vasopressor support and was admitted to the ICU.  Managed on vancomycin and meropenem.  Interval History:  Failed voiding trial overnight.  Difficulty with Foley replacement and urology able to help replace this morning. Updated nephew, [name] this afternoon.  Assessment and Plan: * Septic shock (HCC)-resolved as of 02/01/2023 - initial WBC 15.7 then became neutropenic followed by brisk leukocytosis (no steroids given) - lactic peaked at 3.3 and then downtrended -Blood cultures growing E. coli ESBL in 2/4 bottles (1 set) from 9/22; Urine culture growing same plus Pseudomonas  -Continue meropenem; see bacteremia -Weaning midodrine  Acute pyelonephritis - See septic shock - Continue meropenem  Gram-negative bacteremia Blood cultures growing E. coli ESBL in 2/4 bottles (1 set) from 9/22; Urine culture growing same plus Pseudomonas  -Continue meropenem; plan was 14 day course per ICU note, should be okay for completing 10 days course; currently Day 6/10 (complete on 10/2 then can d/c back to [facility])  Acute renal failure superimposed on stage 3a chronic kidney  disease (HCC)-resolved as of 02/01/2023 - patient has history of CKD3a. Baseline creat ~ 1., eGFR~ 57 - patient presents with increase in creat >0.3 mg/dL above baseline or creat increase >1.5x baseline presumed to have occurred within past 7 days PTA -Creatinine 1.67 on admission - improved with IVF   Acute urinary retention - foley removed 9/27 and patient failed TOV with accumulation on bladder scan - appreciate urology assistance on foley replacement on 9/28 - planning to leave in til at least Tuesday; might reattempt TOV before d/c vs send to SNF with foley and outpatient urology followup  Splenic laceration - per CT 9/22: "1.9 cm subcapsular linear hypodensity in the posterolateral midportion of the spleen. Grossly this has the appearance of a grade 2 laceration, but since there is no perisplenic or subcapsular hematoma or evidence of a free hemorrhage, this is most likely a late-subacute or otherwise contained injury." - Hgb stable, 9.7 g/dL  Wide-complex tachycardia-resolved as of 02/01/2023 - Attributed to multiple electrolyte derangements -Now remains NSR  Bedbound - Patient states wheelchair-bound/bedbound status for several years -Follow-up PT/OT evals - Resides at [facility]  Right 7-8th rib fractures - per CT: "Subacute partially healed displaced fractures of the right posterolateral seventh and eighth ribs."  Chronic diastolic congestive heart failure (HCC) - no s/s exac -Last echo 11/08/2022: EF 65 to 70%, grade 2 diastolic dysfunction.  No RWMA, mild LVH  Hypertension - Hypotensive on admission requiring pressor support - Antihypertensives still on hold - Remains on midodrine, will taper  Dysphagia - Evaluated by speech therapy, appreciate assistance - Continue dysphagia 3 diet  Constipation - Continue laxatives  DMII (diabetes mellitus, type 2) (HCC) -  Diet has been advanced to dysphagia 3 - A1c 8.2% on 11/08/22 - Continue SSI  Hypocalcemia -  repleted  Hypomagnesemia - Repleted  Hypophosphatemia - Repleted  Pressure injury of skin - Continue wound care to buttocks/sacrum  Hypokalemia - Repleted  COPD (chronic obstructive pulmonary disease) (HCC) - No signs/symptoms of exacerbation - Continue DuoNebs PRN  HLD (hyperlipidemia) - Continue Lipitor and Zetia   Old records reviewed in assessment of this patient  Antimicrobials: Rocephin 01/28/2023 x 1 Vancomycin 01/29/2023 >> 01/29/2023 Meropenem 01/29/2023 >> current  DVT prophylaxis:  heparin injection 5,000 Units Start: 01/28/23 2245   Code Status:   Code Status: Limited: Do not attempt resuscitation (DNR) -DNR-LIMITED -Do Not Intubate/DNI   Mobility Assessment (Last 72 Hours)     Mobility Assessment     Row Name 02/03/23 0800 02/02/23 2200 02/02/23 1400 02/02/23 0758 02/02/23 0729   Does patient have an order for bedrest or is patient medically unstable Yes- Bedfast (Level 1) - Complete No - Continue assessment -- No - Continue assessment No - Continue assessment   What is the highest level of mobility based on the progressive mobility assessment? Level 1 (Bedfast) - Unable to balance while sitting on edge of bed Level 1 (Bedfast) - Unable to balance while sitting on edge of bed Level 1 (Bedfast) - Unable to balance while sitting on edge of bed Level 2 (Chairfast) - Balance while sitting on edge of bed and cannot stand Level 2 (Chairfast) - Balance while sitting on edge of bed and cannot stand   Is the above level different from baseline mobility prior to current illness? Yes - Recommend PT order -- -- Yes - Recommend PT order Yes - Recommend PT order    Row Name 02/01/23 1935 02/01/23 0900 02/01/23 0742 01/31/23 1940 01/31/23 1800   Does patient have an order for bedrest or is patient medically unstable No - Continue assessment No - Continue assessment No - Continue assessment No - Continue assessment No - Continue assessment   What is the highest level of  mobility based on the progressive mobility assessment? Level 2 (Chairfast) - Balance while sitting on edge of bed and cannot stand Level 2 (Chairfast) - Balance while sitting on edge of bed and cannot stand Level 2 (Chairfast) - Balance while sitting on edge of bed and cannot stand Level 2 (Chairfast) - Balance while sitting on edge of bed and cannot stand Level 2 (Chairfast) - Balance while sitting on edge of bed and cannot stand   Is the above level different from baseline mobility prior to current illness? Yes - Recommend PT order Yes - Recommend PT order Yes - Recommend PT order Yes - Recommend PT order Yes - Recommend PT order            Barriers to discharge: none Disposition Plan:  [place] Status is: Inpt  Objective: Blood pressure (!) 116/58, pulse 70, temperature (!) 97.4 F (36.3 C), temperature source Oral, resp. rate 16, weight 84.7 kg, SpO2 100%.  Examination:  Physical Exam Constitutional:      Comments: Chronically ill appearing, elderly woman laying in bed in NAD  HENT:     Head: Normocephalic and atraumatic.     Mouth/Throat:     Mouth: Mucous membranes are moist.  Eyes:     Extraocular Movements: Extraocular movements intact.  Cardiovascular:     Rate and Rhythm: Normal rate and regular rhythm.  Pulmonary:     Effort: Pulmonary effort is normal. No respiratory  distress.     Breath sounds: Normal breath sounds. No wheezing.  Abdominal:     General: Bowel sounds are normal. There is no distension.     Palpations: Abdomen is soft.     Tenderness: There is no abdominal tenderness.  Musculoskeletal:        General: Normal range of motion.     Cervical back: Normal range of motion and neck supple.  Skin:    General: Skin is warm and dry.  Neurological:     Mental Status: She is alert.     Comments: Diffuse weakness, worse in LE bilaterally; UE strength is 4/5  Psychiatric:        Mood and Affect: Mood normal.      Consultants:    Procedures:    Data  Reviewed: Results for orders placed or performed during the hospital encounter of 01/28/23 (from the past 24 hour(s))  Glucose, capillary     Status: Abnormal   Collection Time: 02/02/23  9:14 PM  Result Value Ref Range   Glucose-Capillary 147 (H) 70 - 99 mg/dL  Glucose, capillary     Status: Abnormal   Collection Time: 02/03/23  7:29 AM  Result Value Ref Range   Glucose-Capillary 107 (H) 70 - 99 mg/dL  CBC with Differential/Platelet     Status: Abnormal   Collection Time: 02/03/23  8:26 AM  Result Value Ref Range   WBC 11.1 (H) 4.0 - 10.5 K/uL   RBC 3.36 (L) 3.87 - 5.11 MIL/uL   Hemoglobin 9.9 (L) 12.0 - 15.0 g/dL   HCT 86.5 (L) 78.4 - 69.6 %   MCV 91.1 80.0 - 100.0 fL   MCH 29.5 26.0 - 34.0 pg   MCHC 32.4 30.0 - 36.0 g/dL   RDW 29.5 (H) 28.4 - 13.2 %   Platelets 221 150 - 400 K/uL   nRBC 0.0 0.0 - 0.2 %   Neutrophils Relative % 62 %   Neutro Abs 7.0 1.7 - 7.7 K/uL   Lymphocytes Relative 24 %   Lymphs Abs 2.6 0.7 - 4.0 K/uL   Monocytes Relative 8 %   Monocytes Absolute 0.9 0.1 - 1.0 K/uL   Eosinophils Relative 3 %   Eosinophils Absolute 0.3 0.0 - 0.5 K/uL   Basophils Relative 1 %   Basophils Absolute 0.1 0.0 - 0.1 K/uL   Immature Granulocytes 2 %   Abs Immature Granulocytes 0.17 (H) 0.00 - 0.07 K/uL  Magnesium     Status: None   Collection Time: 02/03/23  8:26 AM  Result Value Ref Range   Magnesium 2.1 1.7 - 2.4 mg/dL  Basic metabolic panel     Status: Abnormal   Collection Time: 02/03/23  8:26 AM  Result Value Ref Range   Sodium 141 135 - 145 mmol/L   Potassium 3.5 3.5 - 5.1 mmol/L   Chloride 109 98 - 111 mmol/L   CO2 26 22 - 32 mmol/L   Glucose, Bld 121 (H) 70 - 99 mg/dL   BUN 13 8 - 23 mg/dL   Creatinine, Ser 4.40 0.44 - 1.00 mg/dL   Calcium 7.7 (L) 8.9 - 10.3 mg/dL   GFR, Estimated >10 >27 mL/min   Anion gap 6 5 - 15  Glucose, capillary     Status: Abnormal   Collection Time: 02/03/23 12:22 PM  Result Value Ref Range   Glucose-Capillary 135 (H) 70 - 99  mg/dL  Glucose, capillary     Status: Abnormal   Collection Time: 02/03/23  3:47 PM  Result Value Ref Range   Glucose-Capillary 133 (H) 70 - 99 mg/dL    I have reviewed pertinent nursing notes, vitals, labs, and images as necessary. I have ordered labwork to follow up on as indicated.  I have reviewed the last notes from staff over past 24 hours. I have discussed patient's care plan and test results with nursing staff, CM/SW, and other staff as appropriate.  Time spent: Greater than 50% of the 55 minute visit was spent in counseling/coordination of care for the patient as laid out in the A&P.   LOS: 6 days   Lewie Chamber, MD [facility] 02/03/2023, 4:25 PM

## 2023-02-03 NOTE — Plan of Care (Signed)
Patient alert/oriented X4. Patient compliant with medication administration and tolerated IV meropenem. Patient turned Q2 hours and a new coude foley catheter was placed by urology MD. Patient VSS, no complaints at this time.  Problem: Education: Goal: Ability to describe self-care measures that may prevent or decrease complications (Diabetes Survival Skills Education) will improve Outcome: Progressing   Problem: Education: Goal: Individualized Educational Video(s) Outcome: Progressing   Problem: Coping: Goal: Ability to adjust to condition or change in health will improve Outcome: Progressing   Problem: Fluid Volume: Goal: Ability to maintain a balanced intake and output will improve Outcome: Progressing   Problem: Health Behavior/Discharge Planning: Goal: Ability to identify and utilize available resources and services will improve Outcome: Progressing   Problem: Health Behavior/Discharge Planning: Goal: Ability to manage health-related needs will improve Outcome: Progressing   Problem: Metabolic: Goal: Ability to maintain appropriate glucose levels will improve Outcome: Progressing   Problem: Nutritional: Goal: Maintenance of adequate nutrition will improve Outcome: Progressing   Problem: Nutritional: Goal: Progress toward achieving an optimal weight will improve Outcome: Progressing   Problem: Skin Integrity: Goal: Risk for impaired skin integrity will decrease Outcome: Progressing   Problem: Tissue Perfusion: Goal: Adequacy of tissue perfusion will improve Outcome: Progressing   Problem: Education: Goal: Knowledge of General Education information will improve Description: Including pain rating scale, medication(s)/side effects and non-pharmacologic comfort measures Outcome: Progressing   Problem: Health Behavior/Discharge Planning: Goal: Ability to manage health-related needs will improve Outcome: Progressing   Problem: Clinical Measurements: Goal: Ability  to maintain clinical measurements within normal limits will improve Outcome: Progressing   Problem: Clinical Measurements: Goal: Will remain free from infection Outcome: Progressing   Problem: Clinical Measurements: Goal: Diagnostic test results will improve Outcome: Progressing   Problem: Clinical Measurements: Goal: Respiratory complications will improve Outcome: Progressing   Problem: Clinical Measurements: Goal: Cardiovascular complication will be avoided Outcome: Progressing   Problem: Activity: Goal: Risk for activity intolerance will decrease Outcome: Progressing   Problem: Nutrition: Goal: Adequate nutrition will be maintained Outcome: Progressing   Problem: Coping: Goal: Level of anxiety will decrease Outcome: Progressing   Problem: Elimination: Goal: Will not experience complications related to urinary retention Outcome: Progressing   Problem: Pain Managment: Goal: General experience of comfort will improve Outcome: Progressing   Problem: Safety: Goal: Ability to remain free from injury will improve Outcome: Progressing   Problem: Skin Integrity: Goal: Risk for impaired skin integrity will decrease Outcome: Progressing

## 2023-02-04 DIAGNOSIS — A419 Sepsis, unspecified organism: Secondary | ICD-10-CM | POA: Diagnosis not present

## 2023-02-04 DIAGNOSIS — R1310 Dysphagia, unspecified: Secondary | ICD-10-CM

## 2023-02-04 DIAGNOSIS — R338 Other retention of urine: Secondary | ICD-10-CM | POA: Diagnosis not present

## 2023-02-04 DIAGNOSIS — N179 Acute kidney failure, unspecified: Secondary | ICD-10-CM | POA: Diagnosis not present

## 2023-02-04 DIAGNOSIS — N10 Acute tubulo-interstitial nephritis: Secondary | ICD-10-CM | POA: Diagnosis not present

## 2023-02-04 LAB — GLUCOSE, CAPILLARY
Glucose-Capillary: 111 mg/dL — ABNORMAL HIGH (ref 70–99)
Glucose-Capillary: 121 mg/dL — ABNORMAL HIGH (ref 70–99)
Glucose-Capillary: 118 mg/dL — ABNORMAL HIGH (ref 70–99)
Glucose-Capillary: 208 mg/dL — ABNORMAL HIGH (ref 70–99)

## 2023-02-04 NOTE — Progress Notes (Signed)
Progress Note    [name]   ZOX:[id number]  DOB: 10-09-1945  DOA: 01/28/2023     7 PCP: Paulina Fusi, MD  Initial CC: AMS  Hospital Course: Ms. [name] is a 77 yo female with PMH DM II, HTN, HLD, COPD, HFpEF who initially presented to the ER on 01/28/2023 with increased confusion for approximately 3 days.  She presented from [place].  On workup she was found to be mildly hypotensive, afebrile with mild leukocytosis, 15.7. Infectious workup was commenced.  CXR was relatively unremarkable with area of pleural thickening noted at periphery of right lung base. CT abdomen/pelvis showed hypoenhancement of both kidneys concerning for pyelonephritis and mild urothelial thickening and renal pelves and proximal ureters concerning for ascending UTI; no bladder wall thickening. She developed progressively worsening hypotension requiring vasopressor support and was admitted to the ICU.  Managed on vancomycin and meropenem.  Interval History:  No events overnight.  She has been very adamant on declining anything nectar thickened.  Nutrition has been starting to suffer now over the past couple days due to this.  She continues to beg for transitioning back to thin liquids.  Assessment and Plan: * Septic shock (HCC)-resolved as of 02/01/2023 - initial WBC 15.7 then became neutropenic followed by brisk leukocytosis (no steroids given) - lactic peaked at 3.3 and then downtrended -Blood cultures growing E. coli ESBL in 2/4 bottles (1 set) from 9/22; Urine culture growing same plus Pseudomonas  -Continue meropenem; see bacteremia -Weaning midodrine  Acute pyelonephritis - See septic shock - Continue meropenem  Gram-negative bacteremia Blood cultures growing E. coli ESBL in 2/4 bottles (1 set) from 9/22; Urine culture growing same plus Pseudomonas  -Continue meropenem; plan was 14 day course per ICU note, should be okay for completing 10 days course; currently Day 6/10 (complete on 10/2 then can d/c  back to [facility])  Acute renal failure superimposed on stage 3a chronic kidney disease (HCC)-resolved as of 02/01/2023 - patient has history of CKD3a. Baseline creat ~ 1., eGFR~ 57 - patient presents with increase in creat >0.3 mg/dL above baseline or creat increase >1.5x baseline presumed to have occurred within past 7 days PTA -Creatinine 1.67 on admission - improved with IVF   Acute urinary retention - foley removed 9/27 and patient failed TOV with accumulation on bladder scan - appreciate urology assistance on foley replacement on 9/28 - planning to leave in til at least Tuesday; might reattempt TOV before d/c vs send to SNF with foley and outpatient urology followup  Splenic laceration - per CT 9/22: "1.9 cm subcapsular linear hypodensity in the posterolateral midportion of the spleen. Grossly this has the appearance of a grade 2 laceration, but since there is no perisplenic or subcapsular hematoma or evidence of a free hemorrhage, this is most likely a late-subacute or otherwise contained injury." - Hgb stable, 9.7 g/dL  Wide-complex tachycardia-resolved as of 02/01/2023 - Attributed to multiple electrolyte derangements -Now remains NSR  Bedbound - Patient states wheelchair-bound/bedbound status for several years -Follow-up PT/OT evals - Resides at [facility]  Right 7-8th rib fractures - per CT: "Subacute partially healed displaced fractures of the right posterolateral seventh and eighth ribs."  Chronic diastolic congestive heart failure (HCC) - no s/s exac -Last echo 11/08/2022: EF 65 to 70%, grade 2 diastolic dysfunction.  No RWMA, mild LVH  Hypertension - Hypotensive on admission requiring pressor support - Antihypertensives still on hold - Remains on midodrine, will taper  Dysphagia - Evaluated by speech therapy, appreciate  assistance - Continue dysphagia 3 diet - she has been taking in very little liquids due to not liking the nectar thick liquids and nutrition is  starting to suffer; will allow her a trial of thin liquids as she says she does this at her facility but if overt aspiration well change back or have further discussions with her  Constipation - Continue laxatives  DMII (diabetes mellitus, type 2) (HCC) - Diet has been advanced to dysphagia 3 - A1c 8.2% on 11/08/22 - Continue SSI  Hypocalcemia - repleted  Hypomagnesemia - Repleted  Hypophosphatemia - Repleted  Pressure injury of skin - Continue wound care to buttocks/sacrum  Hypokalemia - Repleted  COPD (chronic obstructive pulmonary disease) (HCC) - No signs/symptoms of exacerbation - Continue DuoNebs PRN  HLD (hyperlipidemia) - Continue Lipitor and Zetia   Old records reviewed in assessment of this patient  Antimicrobials: Rocephin 01/28/2023 x 1 Vancomycin 01/29/2023 >> 01/29/2023 Meropenem 01/29/2023 >> current  DVT prophylaxis:  heparin injection 5,000 Units Start: 01/28/23 2245   Code Status:   Code Status: Limited: Do not attempt resuscitation (DNR) -DNR-LIMITED -Do Not Intubate/DNI   Mobility Assessment (Last 72 Hours)     Mobility Assessment     Row Name 02/04/23 0900 02/03/23 2130 02/03/23 0800 02/02/23 2200 02/02/23 1400   Does patient have an order for bedrest or is patient medically unstable No - Continue assessment No - Continue assessment Yes- Bedfast (Level 1) - Complete No - Continue assessment --   What is the highest level of mobility based on the progressive mobility assessment? Level 1 (Bedfast) - Unable to balance while sitting on edge of bed Level 1 (Bedfast) - Unable to balance while sitting on edge of bed Level 1 (Bedfast) - Unable to balance while sitting on edge of bed Level 1 (Bedfast) - Unable to balance while sitting on edge of bed Level 1 (Bedfast) - Unable to balance while sitting on edge of bed   Is the above level different from baseline mobility prior to current illness? -- -- Yes - Recommend PT order -- --    Row Name 02/02/23 0758  02/02/23 0729 02/01/23 1935       Does patient have an order for bedrest or is patient medically unstable No - Continue assessment No - Continue assessment No - Continue assessment     What is the highest level of mobility based on the progressive mobility assessment? Level 2 (Chairfast) - Balance while sitting on edge of bed and cannot stand Level 2 (Chairfast) - Balance while sitting on edge of bed and cannot stand Level 2 (Chairfast) - Balance while sitting on edge of bed and cannot stand     Is the above level different from baseline mobility prior to current illness? Yes - Recommend PT order Yes - Recommend PT order Yes - Recommend PT order              Barriers to discharge: none Disposition Plan:  [place] Status is: Inpt  Objective: Blood pressure (!) 141/64, pulse 64, temperature 98.5 F (36.9 C), temperature source Oral, resp. rate 17, weight 84.7 kg, SpO2 97%.  Examination:  Physical Exam Constitutional:      Comments: Chronically ill appearing, elderly woman laying in bed in NAD  HENT:     Head: Normocephalic and atraumatic.     Mouth/Throat:     Mouth: Mucous membranes are moist.  Eyes:     Extraocular Movements: Extraocular movements intact.  Cardiovascular:  Rate and Rhythm: Normal rate and regular rhythm.  Pulmonary:     Effort: Pulmonary effort is normal. No respiratory distress.     Breath sounds: Normal breath sounds. No wheezing.  Abdominal:     General: Bowel sounds are normal. There is no distension.     Palpations: Abdomen is soft.     Tenderness: There is no abdominal tenderness.  Musculoskeletal:        General: Normal range of motion.     Cervical back: Normal range of motion and neck supple.  Skin:    General: Skin is warm and dry.  Neurological:     Mental Status: She is alert.     Comments: Diffuse weakness, worse in LE bilaterally; UE strength is 4/5  Psychiatric:        Mood and Affect: Mood normal.      Consultants:     Procedures:    Data Reviewed: Results for orders placed or performed during the hospital encounter of 01/28/23 (from the past 24 hour(s))  Glucose, capillary     Status: Abnormal   Collection Time: 02/03/23  3:47 PM  Result Value Ref Range   Glucose-Capillary 133 (H) 70 - 99 mg/dL  Glucose, capillary     Status: Abnormal   Collection Time: 02/03/23  8:33 PM  Result Value Ref Range   Glucose-Capillary 225 (H) 70 - 99 mg/dL  Glucose, capillary     Status: Abnormal   Collection Time: 02/04/23  7:14 AM  Result Value Ref Range   Glucose-Capillary 111 (H) 70 - 99 mg/dL  Glucose, capillary     Status: Abnormal   Collection Time: 02/04/23 11:42 AM  Result Value Ref Range   Glucose-Capillary 121 (H) 70 - 99 mg/dL    I have reviewed pertinent nursing notes, vitals, labs, and images as necessary. I have ordered labwork to follow up on as indicated.  I have reviewed the last notes from staff over past 24 hours. I have discussed patient's care plan and test results with nursing staff, CM/SW, and other staff as appropriate.  Time spent: Greater than 50% of the 55 minute visit was spent in counseling/coordination of care for the patient as laid out in the A&P.   LOS: 7 days   Lewie Chamber, MD [facility] 02/04/2023, 12:47 PM

## 2023-02-04 NOTE — Progress Notes (Signed)
Pt diet this morning was nectar thick liquids. She refused to drink nectar thick anything. Reached out to Doctor and he changed her diet to thin liquids. Pt is now refusing to eat since yesterday. MD aware. Educated pt on the importance of nutrition.

## 2023-02-05 DIAGNOSIS — N179 Acute kidney failure, unspecified: Secondary | ICD-10-CM | POA: Diagnosis not present

## 2023-02-05 DIAGNOSIS — A419 Sepsis, unspecified organism: Secondary | ICD-10-CM | POA: Diagnosis not present

## 2023-02-05 DIAGNOSIS — N10 Acute tubulo-interstitial nephritis: Secondary | ICD-10-CM | POA: Diagnosis not present

## 2023-02-05 DIAGNOSIS — R338 Other retention of urine: Secondary | ICD-10-CM | POA: Diagnosis not present

## 2023-02-05 LAB — GLUCOSE, CAPILLARY
Glucose-Capillary: 159 mg/dL — ABNORMAL HIGH (ref 70–99)
Glucose-Capillary: 130 mg/dL — ABNORMAL HIGH (ref 70–99)
Glucose-Capillary: 168 mg/dL — ABNORMAL HIGH (ref 70–99)
Glucose-Capillary: 304 mg/dL — ABNORMAL HIGH (ref 70–99)

## 2023-02-05 NOTE — Progress Notes (Signed)
Speech Language Pathology Treatment: Dysphagia  Patient Details Name: [name] MRN: [id number] DOB: 1945-08-31 Today's Date: 02/05/2023 Time: 7253-6644 SLP Time Calculation (min) (ACUTE ONLY): 22 min  Assessment / Plan / Recommendation Clinical Impression  Per RN notes, pt's liquids were changed to thin due to pt refusing any nectar thick liquids. Pt seen for skilled treatment targeting use of compensatory strategies with thin liquids to ensure safety with known risk of aspiration per MBS results demonstrating silent aspiration of thin liquids. Pt verbalized understanding of risk. SLP prompted pt to perform a hard cough after swallowing and to take single, controlled sips via straw of thin liquids. Pt effectively did so with use of faded cueing and teach back reinforcement. SLP posted signs to serve as a visual cue reinforcing use of compensatory strategies. Observed pt with Dys 3 textures with top and bottom dentures donned with no overt s/s of dysphagia or aspiration. Recommend continuing diet of Dys 3 texture solids with thin liquids and strict use of compensatory strategies. Will continue to follow.    HPI HPI: [name] is a 77 yo female presenting to ED 9/22 with three day history of AMS. CTH unremarkable. AMS thought to be secondary to sepsis. Recently admitted for syncopal episode resulting in rib fx and seen by SLP at that time with prolonged mastication and was recommended a regular diet with thin liquids. SLP at [facility] reports pt coughing intermittently with thin liquids and mixed consistency solids, which appears to resolve with use of a L head turn. SLP recommended an OP MBS, which was unable to be completed prior to current admission. PMH includes T2DM, HTN, HLD, RLS, HFpEF, COPD      SLP Plan  Continue with current plan of care      Recommendations for follow up therapy are one component of a multi-disciplinary discharge planning process, led by the attending  physician.  Recommendations may be updated based on patient status, additional functional criteria and insurance authorization.    Recommendations  Diet recommendations: Dysphagia 3 (mechanical soft);Thin liquid Liquids provided via: Straw;Cup Medication Administration: Crushed with puree Supervision: Staff to assist with self feeding;Full supervision/cueing for compensatory strategies Compensations: Slow rate;Small sips/bites;Hard cough after swallow Postural Changes and/or Swallow Maneuvers: Seated upright 90 degrees;Upright 30-60 min after meal                  Oral care QID;Staff/trained caregiver to provide oral care   Frequent or constant Supervision/Assistance Dysphagia, oropharyngeal phase (R13.12)     Continue with current plan of care    Gwynneth Aliment, M.A., CF-SLP Speech Language Pathology, Acute Rehabilitation Services  Secure Chat preferred [phone]   02/05/2023, 12:51 PM

## 2023-02-05 NOTE — Progress Notes (Signed)
Physical Therapy Treatment Patient Details Name: [name] MRN: [id number] DOB: 1945-12-19 Today's Date: 02/05/2023   History of Present Illness This is a 77 year old female presented with AMS the last 2-3 days. Found to have acute pyelonephritis and acute metabolic encephalopathy, septic shock. Splenic laceration on CT possibly from fall on 7/2 when she had rib fractures, hypotension on pressors. PHMx: T2DM, HTN, HLD, RLS, HFpEF, COPD, CVA.  Patient is a resident of [facility] since July 2024.    PT Comments  Pt with slow progress requiring max A for transfers to EOB.  She required frequent cues for sequencing and encouragement/reassurance.  Pt reports fatigued from test earlier in day and only able to sit EOB, perform exercises, and worked on balance.  Deferred OOB due to fatigue (would need hoyer lift at this time). Did defer gait goal at this time due to level of assist required, slow progress. Patient will benefit from continued inpatient follow up therapy, <3 hours/day at d/c.    If plan is discharge home, recommend the following: Assistance with cooking/housework;Two people to help with walking and/or transfers;Direct supervision/assist for medications management;Assist for transportation;Help with stairs or ramp for entrance;Two people to help with bathing/dressing/bathroom   Can travel by private vehicle     No  Equipment Recommendations  Other (comment) (defer to facility)    Recommendations for Other Services       Precautions / Restrictions Precautions Precautions: Fall Precaution Comments: watch BP, bring briefs; Contact precs     Mobility  Bed Mobility Overal bed mobility: Needs Assistance Bed Mobility: Rolling Rolling: Max assist, Used rails   Supine to sit: Max assist, HOB elevated, Used rails Sit to supine: Max assist, +2 for physical assistance   General bed mobility comments: Increased time, cues for sequencing, w/ transfer to sit cues to lean forward and  for weight shifting to assist with scooting.  Pt fearful of falling    Transfers                   General transfer comment: deferred, pt reports too fatigued after swallow study today    Ambulation/Gait                   Stairs             Wheelchair Mobility     Tilt Bed    Modified Rankin (Stroke Patients Only)       Balance Overall balance assessment: Needs assistance Sitting-balance support: Feet supported, Bilateral upper extremity supported, No upper extremity supported Sitting balance-Leahy Scale: Fair Sitting balance - Comments: Initially leaning posterior and on R elbow.  With work on knee flexion, encouragement, reaching forward for targets, assist to flex trunk at hips, and cues to reach down legs like applying lotion able to progress to sitting EOB 5 mins without UE support (10 mins total)       Standing balance comment: deferred                            Cognition Arousal: Alert Behavior During Therapy: Flat affect Overall Cognitive Status: No family/caregiver present to determine baseline cognitive functioning                                 General Comments: Slow processing needing cues for transfer techniques        Exercises Total Joint  Exercises Ankle Circles/Pumps: AAROM, Both, 10 reps, Seated (with heel cord stretch 3 rep 30 sec) Heel Slides: AAROM, Both, 10 reps, Supine Long Arc Quad: AROM, Both, 10 reps, Seated Knee Flexion: AAROM, Both, 10 reps, Seated (wash cloth under foot, stretching with 5 sec hold to toleramnce)    General Comments        Pertinent Vitals/Pain Pain Assessment Pain Assessment: Faces Faces Pain Scale: Hurts a little bit Pain Location: sore generalized Pain Descriptors / Indicators: Sore Pain Intervention(s): Limited activity within patient's tolerance, Monitored during session, Repositioned    Home Living                          Prior Function             PT Goals (current goals can now be found in the care plan section) Acute Rehab PT Goals Time For Goal Achievement: 02/19/23 Progress towards PT goals: Goals downgraded-see care plan (slow progress, deferred gait goal)    Frequency    Min 1X/week      PT Plan      Co-evaluation              AM-PAC PT "6 Clicks" Mobility   Outcome Measure  Help needed turning from your back to your side while in a flat bed without using bedrails?: A Lot Help needed moving from lying on your back to sitting on the side of a flat bed without using bedrails?: Total Help needed moving to and from a bed to a chair (including a wheelchair)?: Total Help needed standing up from a chair using your arms (e.g., wheelchair or bedside chair)?: Total Help needed to walk in hospital room?: Total Help needed climbing 3-5 steps with a railing? : Total 6 Click Score: 7    End of Session   Activity Tolerance: Patient limited by fatigue Patient left: in bed;with call bell/phone within reach;with bed alarm set (pillow under L side to offload) Nurse Communication: Mobility status;Need for lift equipment PT Visit Diagnosis: Unsteadiness on feet (R26.81);Other abnormalities of gait and mobility (R26.89);Muscle weakness (generalized) (M62.81);Difficulty in walking, not elsewhere classified (R26.2)     Time: 4098-1191 PT Time Calculation (min) (ACUTE ONLY): 24 min  Charges:    $Therapeutic Exercise: 8-22 mins $Therapeutic Activity: 8-22 mins PT General Charges $$ ACUTE PT VISIT: 1 Visit                     Anise Salvo, PT Acute Rehab [facility] [phone]    Rayetta Humphrey 02/05/2023, 6:05 PM

## 2023-02-05 NOTE — Progress Notes (Signed)
Progress Note    [name]   BJY:[id number]  DOB: Dec 31, 1945  DOA: 01/28/2023     8 PCP: Paulina Fusi, MD  Initial CC: AMS  Hospital Course: [name] is a 77 yo female with PMH DM II, HTN, HLD, COPD, HFpEF who initially presented to the ER on 01/28/2023 with increased confusion for approximately 3 days.  She presented from [place].  On workup she was found to be mildly hypotensive, afebrile with mild leukocytosis, 15.7. Infectious workup was commenced.  CXR was relatively unremarkable with area of pleural thickening noted at periphery of right lung base. CT abdomen/pelvis showed hypoenhancement of both kidneys concerning for pyelonephritis and mild urothelial thickening and renal pelves and proximal ureters concerning for ascending UTI; no bladder wall thickening. She developed progressively worsening hypotension requiring vasopressor support and was admitted to the ICU.  Managed on meropenem.  Interval History:  Finally starting to eat some after nectar thick d/c'd yesterday. Otherwise doing okay.   Assessment and Plan: * Septic shock (HCC)-resolved as of 02/01/2023 - initial WBC 15.7 then became neutropenic followed by brisk leukocytosis (no steroids given) - lactic peaked at 3.3 and then downtrended -Blood cultures growing E. coli ESBL in 2/4 bottles (1 set) from 9/22; Urine culture growing same plus Pseudomonas  -Continue meropenem; see bacteremia -Weaning midodrine  Acute pyelonephritis - See septic shock - Continue meropenem  Gram-negative bacteremia Blood cultures growing E. coli ESBL in 2/4 bottles (1 set) from 9/22; Urine culture growing same plus Pseudomonas  -Continue meropenem; plan was 14 day course per ICU note, should be okay for completing 10 days course; (complete on 10/2 then can d/c back to [facility] or other SNF)  Acute renal failure superimposed on stage 3a chronic kidney disease (HCC)-resolved as of 02/01/2023 - patient has history of CKD3a. Baseline creat  ~ 1., eGFR~ 57 - patient presents with increase in creat >0.3 mg/dL above baseline or creat increase >1.5x baseline presumed to have occurred within past 7 days PTA -Creatinine 1.67 on admission - improved with IVF   Acute urinary retention - foley removed 9/27 and patient failed TOV with accumulation on bladder scan - appreciate urology assistance on foley replacement on 9/28 - planning to leave in til at least Tuesday; might reattempt TOV before d/c vs send to SNF with foley and outpatient urology followup  Splenic laceration - per CT 9/22: "1.9 cm subcapsular linear hypodensity in the posterolateral midportion of the spleen. Grossly this has the appearance of a grade 2 laceration, but since there is no perisplenic or subcapsular hematoma or evidence of a free hemorrhage, this is most likely a late-subacute or otherwise contained injury." - Hgb stable  Wide-complex tachycardia-resolved as of 02/01/2023 - Attributed to multiple electrolyte derangements -Now remains NSR  Bedbound - Patient states wheelchair-bound/bedbound status for several years -Follow-up PT/OT evals - Resides at [facility]  Right 7-8th rib fractures - per CT: "Subacute partially healed displaced fractures of the right posterolateral seventh and eighth ribs."  Chronic diastolic congestive heart failure (HCC) - no s/s exac -Last echo 11/08/2022: EF 65 to 70%, grade 2 diastolic dysfunction.  No RWMA, mild LVH  Hypertension - Hypotensive on admission requiring pressor support - Antihypertensives still on hold - Remains on midodrine, will taper  Dysphagia - Evaluated by speech therapy, appreciate assistance - Continue dysphagia 3 diet - she has been taking in very little liquids due to not liking the nectar thick liquids and nutrition is starting to suffer; will allow  her a trial of thin liquids as she says she does this at her facility but if overt aspiration well change back or have further discussions with  her  Constipation - Continue laxatives  DMII (diabetes mellitus, type 2) (HCC) - Diet has been advanced to dysphagia 3 - A1c 8.2% on 11/08/22 - Continue SSI  Hypocalcemia - repleted  Hypomagnesemia - Repleted  Hypophosphatemia - Repleted  Pressure injury of skin - Continue wound care to buttocks/sacrum  Hypokalemia - Repleted  COPD (chronic obstructive pulmonary disease) (HCC) - No signs/symptoms of exacerbation - Continue DuoNebs PRN  HLD (hyperlipidemia) - Continue Lipitor and Zetia   Old records reviewed in assessment of this patient  Antimicrobials: Rocephin 01/28/2023 x 1 Vancomycin 01/29/2023 >> 01/29/2023 Meropenem 01/29/2023 >> current  DVT prophylaxis:  heparin injection 5,000 Units Start: 01/28/23 2245   Code Status:   Code Status: Limited: Do not attempt resuscitation (DNR) -DNR-LIMITED -Do Not Intubate/DNI   Mobility Assessment (Last 72 Hours)     Mobility Assessment     Row Name 02/05/23 1200 02/05/23 0715 02/04/23 2130 02/04/23 0900 02/03/23 2130   Does patient have an order for bedrest or is patient medically unstable Yes- Bedfast (Level 1) - Complete Yes- Bedfast (Level 1) - Complete No - Continue assessment No - Continue assessment No - Continue assessment   What is the highest level of mobility based on the progressive mobility assessment? Level 2 (Chairfast) - Balance while sitting on edge of bed and cannot stand Level 2 (Chairfast) - Balance while sitting on edge of bed and cannot stand Level 1 (Bedfast) - Unable to balance while sitting on edge of bed Level 1 (Bedfast) - Unable to balance while sitting on edge of bed Level 1 (Bedfast) - Unable to balance while sitting on edge of bed    Row Name 02/03/23 0800 02/02/23 2200 02/02/23 1400       Does patient have an order for bedrest or is patient medically unstable Yes- Bedfast (Level 1) - Complete No - Continue assessment --     What is the highest level of mobility based on the progressive  mobility assessment? Level 1 (Bedfast) - Unable to balance while sitting on edge of bed Level 1 (Bedfast) - Unable to balance while sitting on edge of bed Level 1 (Bedfast) - Unable to balance while sitting on edge of bed     Is the above level different from baseline mobility prior to current illness? Yes - Recommend PT order -- --              Barriers to discharge: none Disposition Plan:  [place] Status is: Inpt  Objective: Blood pressure 133/67, pulse 63, temperature 98.5 F (36.9 C), temperature source Oral, resp. rate 16, weight 84.7 kg, SpO2 98%.  Examination:  Physical Exam Constitutional:      Comments: Chronically ill appearing, elderly woman laying in bed in NAD  HENT:     Head: Normocephalic and atraumatic.     Mouth/Throat:     Mouth: Mucous membranes are moist.  Eyes:     Extraocular Movements: Extraocular movements intact.  Cardiovascular:     Rate and Rhythm: Normal rate and regular rhythm.  Pulmonary:     Effort: Pulmonary effort is normal. No respiratory distress.     Breath sounds: Normal breath sounds. No wheezing.  Abdominal:     General: Bowel sounds are normal. There is no distension.     Palpations: Abdomen is soft.  Tenderness: There is no abdominal tenderness.  Musculoskeletal:        General: Normal range of motion.     Cervical back: Normal range of motion and neck supple.  Skin:    General: Skin is warm and dry.  Neurological:     Mental Status: She is alert.     Comments: Diffuse weakness, worse in LE bilaterally; UE strength is 4/5  Psychiatric:        Mood and Affect: Mood normal.      Consultants:    Procedures:    Data Reviewed: Results for orders placed or performed during the hospital encounter of 01/28/23 (from the past 24 hour(s))  Glucose, capillary     Status: Abnormal   Collection Time: 02/04/23  4:12 PM  Result Value Ref Range   Glucose-Capillary 118 (H) 70 - 99 mg/dL  Glucose, capillary     Status: Abnormal    Collection Time: 02/04/23  8:46 PM  Result Value Ref Range   Glucose-Capillary 208 (H) 70 - 99 mg/dL  Glucose, capillary     Status: Abnormal   Collection Time: 02/05/23  7:33 AM  Result Value Ref Range   Glucose-Capillary 159 (H) 70 - 99 mg/dL  Glucose, capillary     Status: Abnormal   Collection Time: 02/05/23 11:45 AM  Result Value Ref Range   Glucose-Capillary 130 (H) 70 - 99 mg/dL    I have reviewed pertinent nursing notes, vitals, labs, and images as necessary. I have ordered labwork to follow up on as indicated.  I have reviewed the last notes from staff over past 24 hours. I have discussed patient's care plan and test results with nursing staff, CM/SW, and other staff as appropriate.    LOS: 8 days   Lewie Chamber, MD [facility] 02/05/2023, 1:45 PM

## 2023-02-06 DIAGNOSIS — N179 Acute kidney failure, unspecified: Secondary | ICD-10-CM | POA: Diagnosis not present

## 2023-02-06 DIAGNOSIS — N10 Acute tubulo-interstitial nephritis: Secondary | ICD-10-CM | POA: Diagnosis not present

## 2023-02-06 DIAGNOSIS — A419 Sepsis, unspecified organism: Secondary | ICD-10-CM | POA: Diagnosis not present

## 2023-02-06 DIAGNOSIS — R338 Other retention of urine: Secondary | ICD-10-CM | POA: Diagnosis not present

## 2023-02-06 LAB — GLUCOSE, CAPILLARY
Glucose-Capillary: 157 mg/dL — ABNORMAL HIGH (ref 70–99)
Glucose-Capillary: 131 mg/dL — ABNORMAL HIGH (ref 70–99)
Glucose-Capillary: 132 mg/dL — ABNORMAL HIGH (ref 70–99)
Glucose-Capillary: 154 mg/dL — ABNORMAL HIGH (ref 70–99)
Glucose-Capillary: 195 mg/dL — ABNORMAL HIGH (ref 70–99)

## 2023-02-06 NOTE — Progress Notes (Signed)
Progress Note    [name]   WGN:[id number]  DOB: [birth year]  DOA: 01/28/2023     9 PCP: Paulina Fusi, MD  Initial CC: AMS  Hospital Course: [name] is a 77 yo female with PMH DM II, HTN, HLD, COPD, HFpEF who initially presented to the ER on 01/28/2023 with increased confusion for approximately 3 days.  She presented from [place].  On workup she was found to be mildly hypotensive, afebrile with mild leukocytosis, 15.7. Infectious workup was commenced.  CXR was relatively unremarkable with area of pleural thickening noted at periphery of right lung base. CT abdomen/pelvis showed hypoenhancement of both kidneys concerning for pyelonephritis and mild urothelial thickening and renal pelves and proximal ureters concerning for ascending UTI; no bladder wall thickening. She developed progressively worsening hypotension requiring vasopressor support and was admitted to the ICU.  Managed on meropenem.  Interval History:  Seems to be eating a little better after diet change. We did discuss probably removing Foley tomorrow and doing trial of void.  If fails will need to be discharged with Foley and follow-up outpatient with urology.  Assessment and Plan: * Septic shock (HCC)-resolved as of 02/01/2023 - initial WBC 15.7 then became neutropenic followed by brisk leukocytosis (no steroids given) - lactic peaked at 3.3 and then downtrended -Blood cultures growing E. coli ESBL in 2/4 bottles (1 set) from 9/22; Urine culture growing same plus Pseudomonas  -Continue meropenem; see bacteremia -Weaning midodrine  Acute pyelonephritis - See septic shock - Continue meropenem  Gram-negative bacteremia Blood cultures growing E. coli ESBL in 2/4 bottles (1 set) from 9/22; Urine culture growing same plus Pseudomonas  -Continue meropenem; plan was 14 day course per ICU note, should be okay for completing 10 days course; (complete on 10/2 then can d/c back to [facility] or other SNF)  Acute renal failure  superimposed on stage 3a chronic kidney disease (HCC)-resolved as of 02/01/2023 - patient has history of CKD3a. Baseline creat ~ 1., eGFR~ 57 - patient presents with increase in creat >0.3 mg/dL above baseline or creat increase >1.5x baseline presumed to have occurred within past 7 days PTA -Creatinine 1.67 on admission - improved with IVF   Acute urinary retention - foley removed 9/27 and patient failed TOV with accumulation on bladder scan - appreciate urology assistance on foley replacement on 9/28 - planning to leave in til at least 10/2 - on 10/2 can try TOV/foley removal in the morning and bladder scans. If voiding well by afternoon would be okay for d/c to [facility]. If fails again, needs foley replaced for at least 2 weeks and would then have referral to urology for follow up  Bedbound - Patient states wheelchair-bound/bedbound status for several years -Follow-up PT/OT evals - Resides at [facility]  Splenic laceration - per CT 9/22: "1.9 cm subcapsular linear hypodensity in the posterolateral midportion of the spleen. Grossly this has the appearance of a grade 2 laceration, but since there is no perisplenic or subcapsular hematoma or evidence of a free hemorrhage, this is most likely a late-subacute or otherwise contained injury." - Hgb stable  Right 7-8th rib fractures - per CT: "Subacute partially healed displaced fractures of the right posterolateral seventh and eighth ribs."  Chronic diastolic congestive heart failure (HCC) - no s/s exac -Last echo 11/08/2022: EF 65 to 70%, grade 2 diastolic dysfunction.  No RWMA, mild LVH  Wide-complex tachycardia-resolved as of 02/01/2023 - Attributed to multiple electrolyte derangements -Now remains NSR  Hypertension - Hypotensive on  admission requiring pressor support - Antihypertensives still on hold - Remains on midodrine, will taper  Dysphagia - Evaluated by speech therapy, appreciate assistance - Continue dysphagia 3 diet - she  has been taking in very little liquids due to not liking the nectar thick liquids and nutrition is starting to suffer; will allow her a trial of thin liquids as she says she does this at her facility but if overt aspiration well change back or have further discussions with her  Constipation - Continue laxatives  DMII (diabetes mellitus, type 2) (HCC) - Diet has been advanced to dysphagia 3 - A1c 8.2% on 11/08/22 - Continue SSI  Hypocalcemia - repleted  Hypomagnesemia - Repleted  Hypophosphatemia - Repleted  Pressure injury of skin - Continue wound care to buttocks/sacrum  Hypokalemia - Repleted  COPD (chronic obstructive pulmonary disease) (HCC) - No signs/symptoms of exacerbation - Continue DuoNebs PRN  HLD (hyperlipidemia) - Continue Lipitor and Zetia   Old records reviewed in assessment of this patient  Antimicrobials: Rocephin 01/28/2023 x 1 Vancomycin 01/29/2023 >> 01/29/2023 Meropenem 01/29/2023 >> current  DVT prophylaxis:  heparin injection 5,000 Units Start: 01/28/23 2245   Code Status:   Code Status: Limited: Do not attempt resuscitation (DNR) -DNR-LIMITED -Do Not Intubate/DNI   Mobility Assessment (Last 72 Hours)     Mobility Assessment     Row Name 02/06/23 0800 02/06/23 0730 02/06/23 0700 02/05/23 1935 02/05/23 1803   Does patient have an order for bedrest or is patient medically unstable No - Continue assessment No - Continue assessment No - Continue assessment No - Continue assessment --   What is the highest level of mobility based on the progressive mobility assessment? Level 2 (Chairfast) - Balance while sitting on edge of bed and cannot stand Level 2 (Chairfast) - Balance while sitting on edge of bed and cannot stand Level 2 (Chairfast) - Balance while sitting on edge of bed and cannot stand Level 2 (Chairfast) - Balance while sitting on edge of bed and cannot stand Level 2 (Chairfast) - Balance while sitting on edge of bed and cannot stand   Is the  above level different from baseline mobility prior to current illness? Yes - Recommend PT order Yes - Recommend PT order Yes - Recommend PT order Yes - Recommend PT order --    Row Name 02/05/23 1200 02/05/23 0715 02/04/23 2130 02/04/23 0900 02/03/23 2130   Does patient have an order for bedrest or is patient medically unstable Yes- Bedfast (Level 1) - Complete Yes- Bedfast (Level 1) - Complete No - Continue assessment No - Continue assessment No - Continue assessment   What is the highest level of mobility based on the progressive mobility assessment? Level 2 (Chairfast) - Balance while sitting on edge of bed and cannot stand Level 2 (Chairfast) - Balance while sitting on edge of bed and cannot stand Level 1 (Bedfast) - Unable to balance while sitting on edge of bed Level 1 (Bedfast) - Unable to balance while sitting on edge of bed Level 1 (Bedfast) - Unable to balance while sitting on edge of bed            Barriers to discharge: none Disposition Plan:  [place] Status is: Inpt  Objective: Blood pressure 133/61, pulse 74, temperature 98.3 F (36.8 C), temperature source Oral, resp. rate 20, weight 84.7 kg, SpO2 100%.  Examination:  Physical Exam Constitutional:      Comments: Chronically ill appearing, elderly woman laying in bed in NAD  HENT:  Head: Normocephalic and atraumatic.     Mouth/Throat:     Mouth: Mucous membranes are moist.  Eyes:     Extraocular Movements: Extraocular movements intact.  Cardiovascular:     Rate and Rhythm: Normal rate and regular rhythm.  Pulmonary:     Effort: Pulmonary effort is normal. No respiratory distress.     Breath sounds: Normal breath sounds. No wheezing.  Abdominal:     General: Bowel sounds are normal. There is no distension.     Palpations: Abdomen is soft.     Tenderness: There is no abdominal tenderness.  Genitourinary:    Comments: Foley in place with clear yellow urine noted in bag Musculoskeletal:        General: Normal  range of motion.     Cervical back: Normal range of motion and neck supple.  Skin:    General: Skin is warm and dry.  Neurological:     Mental Status: She is alert.     Comments: Diffuse weakness, worse in LE bilaterally; UE strength is 4/5  Psychiatric:        Mood and Affect: Mood normal.      Consultants:    Procedures:    Data Reviewed: Results for orders placed or performed during the hospital encounter of 01/28/23 (from the past 24 hour(s))  Glucose, capillary     Status: Abnormal   Collection Time: 02/05/23  4:20 PM  Result Value Ref Range   Glucose-Capillary 168 (H) 70 - 99 mg/dL  Glucose, capillary     Status: Abnormal   Collection Time: 02/05/23  9:15 PM  Result Value Ref Range   Glucose-Capillary 304 (H) 70 - 99 mg/dL  Glucose, capillary     Status: Abnormal   Collection Time: 02/06/23  6:44 AM  Result Value Ref Range   Glucose-Capillary 157 (H) 70 - 99 mg/dL  Glucose, capillary     Status: Abnormal   Collection Time: 02/06/23  7:34 AM  Result Value Ref Range   Glucose-Capillary 131 (H) 70 - 99 mg/dL  Glucose, capillary     Status: Abnormal   Collection Time: 02/06/23 12:03 PM  Result Value Ref Range   Glucose-Capillary 132 (H) 70 - 99 mg/dL    I have reviewed pertinent nursing notes, vitals, labs, and images as necessary. I have ordered labwork to follow up on as indicated.  I have reviewed the last notes from staff over past 24 hours. I have discussed patient's care plan and test results with nursing staff, CM/SW, and other staff as appropriate.    LOS: 9 days   Lewie Chamber, MD [facility] 02/06/2023, 2:29 PM

## 2023-02-06 NOTE — Plan of Care (Signed)
Patient alert/oriented X4. Patient compliant with medication administration and tolerated IV abx. Will relay message for removing foley catheter tomorrow morning to night shift. Patient ate 100% of meals. No complaints at this time, VSS.  Problem: Education: Goal: Ability to describe self-care measures that may prevent or decrease complications (Diabetes Survival Skills Education) will improve Outcome: Progressing   Problem: Education: Goal: Individualized Educational Video(s) Outcome: Progressing   Problem: Coping: Goal: Ability to adjust to condition or change in health will improve Outcome: Progressing   Problem: Fluid Volume: Goal: Ability to maintain a balanced intake and output will improve Outcome: Progressing   Problem: Health Behavior/Discharge Planning: Goal: Ability to identify and utilize available resources and services will improve Outcome: Progressing   Problem: Health Behavior/Discharge Planning: Goal: Ability to manage health-related needs will improve Outcome: Progressing   Problem: Metabolic: Goal: Ability to maintain appropriate glucose levels will improve Outcome: Progressing   Problem: Nutritional: Goal: Maintenance of adequate nutrition will improve Outcome: Progressing   Problem: Nutritional: Goal: Progress toward achieving an optimal weight will improve Outcome: Progressing   Problem: Skin Integrity: Goal: Risk for impaired skin integrity will decrease Outcome: Progressing   Problem: Tissue Perfusion: Goal: Adequacy of tissue perfusion will improve Outcome: Progressing   Problem: Education: Goal: Knowledge of General Education information will improve Description: Including pain rating scale, medication(s)/side effects and non-pharmacologic comfort measures Outcome: Progressing   Problem: Health Behavior/Discharge Planning: Goal: Ability to manage health-related needs will improve Outcome: Progressing   Problem: Clinical  Measurements: Goal: Ability to maintain clinical measurements within normal limits will improve Outcome: Progressing   Problem: Clinical Measurements: Goal: Will remain free from infection Outcome: Progressing   Problem: Clinical Measurements: Goal: Diagnostic test results will improve Outcome: Progressing   Problem: Clinical Measurements: Goal: Respiratory complications will improve Outcome: Progressing   Problem: Clinical Measurements: Goal: Cardiovascular complication will be avoided Outcome: Progressing   Problem: Activity: Goal: Risk for activity intolerance will decrease Outcome: Progressing   Problem: Nutrition: Goal: Adequate nutrition will be maintained Outcome: Progressing   Problem: Coping: Goal: Level of anxiety will decrease Outcome: Progressing   Problem: Elimination: Goal: Will not experience complications related to bowel motility Outcome: Progressing   Problem: Elimination: Goal: Will not experience complications related to urinary retention Outcome: Progressing   Problem: Pain Managment: Goal: General experience of comfort will improve Outcome: Progressing   Problem: Safety: Goal: Ability to remain free from injury will improve Outcome: Progressing   Problem: Skin Integrity: Goal: Risk for impaired skin integrity will decrease Outcome: Progressing

## 2023-02-07 DIAGNOSIS — Z87448 Personal history of other diseases of urinary system: Secondary | ICD-10-CM | POA: Diagnosis not present

## 2023-02-07 DIAGNOSIS — R102 Pelvic and perineal pain: Secondary | ICD-10-CM | POA: Diagnosis not present

## 2023-02-07 DIAGNOSIS — J42 Unspecified chronic bronchitis: Secondary | ICD-10-CM | POA: Diagnosis not present

## 2023-02-07 DIAGNOSIS — R0689 Other abnormalities of breathing: Secondary | ICD-10-CM | POA: Diagnosis not present

## 2023-02-07 DIAGNOSIS — B9620 Unspecified Escherichia coli [E. coli] as the cause of diseases classified elsewhere: Secondary | ICD-10-CM | POA: Diagnosis not present

## 2023-02-07 DIAGNOSIS — L0231 Cutaneous abscess of buttock: Secondary | ICD-10-CM | POA: Diagnosis not present

## 2023-02-07 DIAGNOSIS — Z7401 Bed confinement status: Secondary | ICD-10-CM | POA: Diagnosis not present

## 2023-02-07 DIAGNOSIS — K219 Gastro-esophageal reflux disease without esophagitis: Secondary | ICD-10-CM | POA: Diagnosis not present

## 2023-02-07 DIAGNOSIS — N136 Pyonephrosis: Secondary | ICD-10-CM | POA: Diagnosis not present

## 2023-02-07 DIAGNOSIS — I6350 Cerebral infarction due to unspecified occlusion or stenosis of unspecified cerebral artery: Secondary | ICD-10-CM | POA: Diagnosis not present

## 2023-02-07 DIAGNOSIS — R6521 Severe sepsis with septic shock: Secondary | ICD-10-CM | POA: Diagnosis present

## 2023-02-07 DIAGNOSIS — Z9181 History of falling: Secondary | ICD-10-CM | POA: Diagnosis not present

## 2023-02-07 DIAGNOSIS — I69820 Aphasia following other cerebrovascular disease: Secondary | ICD-10-CM | POA: Diagnosis not present

## 2023-02-07 DIAGNOSIS — R296 Repeated falls: Secondary | ICD-10-CM | POA: Diagnosis not present

## 2023-02-07 DIAGNOSIS — I4891 Unspecified atrial fibrillation: Secondary | ICD-10-CM | POA: Diagnosis not present

## 2023-02-07 DIAGNOSIS — D631 Anemia in chronic kidney disease: Secondary | ICD-10-CM | POA: Diagnosis not present

## 2023-02-07 DIAGNOSIS — K2960 Other gastritis without bleeding: Secondary | ICD-10-CM | POA: Diagnosis not present

## 2023-02-07 DIAGNOSIS — L03317 Cellulitis of buttock: Secondary | ICD-10-CM | POA: Diagnosis not present

## 2023-02-07 DIAGNOSIS — B9629 Other Escherichia coli [E. coli] as the cause of diseases classified elsewhere: Secondary | ICD-10-CM | POA: Diagnosis not present

## 2023-02-07 DIAGNOSIS — E1165 Type 2 diabetes mellitus with hyperglycemia: Secondary | ICD-10-CM | POA: Diagnosis not present

## 2023-02-07 DIAGNOSIS — R1310 Dysphagia, unspecified: Secondary | ICD-10-CM | POA: Diagnosis present

## 2023-02-07 DIAGNOSIS — M6259 Muscle wasting and atrophy, not elsewhere classified, multiple sites: Secondary | ICD-10-CM | POA: Diagnosis not present

## 2023-02-07 DIAGNOSIS — R000 Tachycardia, unspecified: Secondary | ICD-10-CM | POA: Diagnosis not present

## 2023-02-07 DIAGNOSIS — R262 Difficulty in walking, not elsewhere classified: Secondary | ICD-10-CM | POA: Diagnosis not present

## 2023-02-07 DIAGNOSIS — I959 Hypotension, unspecified: Secondary | ICD-10-CM | POA: Diagnosis not present

## 2023-02-07 DIAGNOSIS — Z66 Do not resuscitate: Secondary | ICD-10-CM | POA: Diagnosis not present

## 2023-02-07 DIAGNOSIS — F05 Delirium due to known physiological condition: Secondary | ICD-10-CM | POA: Diagnosis not present

## 2023-02-07 DIAGNOSIS — I110 Hypertensive heart disease with heart failure: Secondary | ICD-10-CM | POA: Diagnosis not present

## 2023-02-07 DIAGNOSIS — N3001 Acute cystitis with hematuria: Secondary | ICD-10-CM | POA: Diagnosis not present

## 2023-02-07 DIAGNOSIS — A4151 Sepsis due to Escherichia coli [E. coli]: Secondary | ICD-10-CM | POA: Diagnosis not present

## 2023-02-07 DIAGNOSIS — M6281 Muscle weakness (generalized): Secondary | ICD-10-CM | POA: Diagnosis not present

## 2023-02-07 DIAGNOSIS — N10 Acute tubulo-interstitial nephritis: Secondary | ICD-10-CM | POA: Diagnosis not present

## 2023-02-07 DIAGNOSIS — S2249XD Multiple fractures of ribs, unspecified side, subsequent encounter for fracture with routine healing: Secondary | ICD-10-CM | POA: Diagnosis not present

## 2023-02-07 DIAGNOSIS — G9341 Metabolic encephalopathy: Secondary | ICD-10-CM | POA: Diagnosis present

## 2023-02-07 DIAGNOSIS — Z1152 Encounter for screening for COVID-19: Secondary | ICD-10-CM | POA: Diagnosis not present

## 2023-02-07 DIAGNOSIS — E785 Hyperlipidemia, unspecified: Secondary | ICD-10-CM | POA: Diagnosis not present

## 2023-02-07 DIAGNOSIS — Z7902 Long term (current) use of antithrombotics/antiplatelets: Secondary | ICD-10-CM | POA: Diagnosis not present

## 2023-02-07 DIAGNOSIS — R32 Unspecified urinary incontinence: Secondary | ICD-10-CM | POA: Diagnosis not present

## 2023-02-07 DIAGNOSIS — R2689 Other abnormalities of gait and mobility: Secondary | ICD-10-CM | POA: Diagnosis not present

## 2023-02-07 DIAGNOSIS — A498 Other bacterial infections of unspecified site: Secondary | ICD-10-CM | POA: Diagnosis not present

## 2023-02-07 DIAGNOSIS — Z741 Need for assistance with personal care: Secondary | ICD-10-CM | POA: Diagnosis not present

## 2023-02-07 DIAGNOSIS — K5909 Other constipation: Secondary | ICD-10-CM | POA: Diagnosis not present

## 2023-02-07 DIAGNOSIS — E440 Moderate protein-calorie malnutrition: Secondary | ICD-10-CM | POA: Diagnosis not present

## 2023-02-07 DIAGNOSIS — L89309 Pressure ulcer of unspecified buttock, unspecified stage: Secondary | ICD-10-CM | POA: Diagnosis not present

## 2023-02-07 DIAGNOSIS — I5032 Chronic diastolic (congestive) heart failure: Secondary | ICD-10-CM | POA: Diagnosis present

## 2023-02-07 DIAGNOSIS — D649 Anemia, unspecified: Secondary | ICD-10-CM | POA: Diagnosis not present

## 2023-02-07 DIAGNOSIS — Z8619 Personal history of other infectious and parasitic diseases: Secondary | ICD-10-CM | POA: Diagnosis not present

## 2023-02-07 DIAGNOSIS — R319 Hematuria, unspecified: Secondary | ICD-10-CM | POA: Diagnosis not present

## 2023-02-07 DIAGNOSIS — R404 Transient alteration of awareness: Secondary | ICD-10-CM | POA: Diagnosis not present

## 2023-02-07 DIAGNOSIS — R531 Weakness: Secondary | ICD-10-CM | POA: Diagnosis not present

## 2023-02-07 DIAGNOSIS — I2510 Atherosclerotic heart disease of native coronary artery without angina pectoris: Secondary | ICD-10-CM | POA: Diagnosis not present

## 2023-02-07 DIAGNOSIS — R41841 Cognitive communication deficit: Secondary | ICD-10-CM | POA: Diagnosis not present

## 2023-02-07 DIAGNOSIS — K5641 Fecal impaction: Secondary | ICD-10-CM | POA: Diagnosis not present

## 2023-02-07 DIAGNOSIS — L89316 Pressure-induced deep tissue damage of right buttock: Secondary | ICD-10-CM | POA: Diagnosis not present

## 2023-02-07 DIAGNOSIS — N179 Acute kidney failure, unspecified: Secondary | ICD-10-CM | POA: Diagnosis not present

## 2023-02-07 DIAGNOSIS — G9340 Encephalopathy, unspecified: Secondary | ICD-10-CM | POA: Diagnosis not present

## 2023-02-07 DIAGNOSIS — R55 Syncope and collapse: Secondary | ICD-10-CM | POA: Diagnosis not present

## 2023-02-07 DIAGNOSIS — R410 Disorientation, unspecified: Secondary | ICD-10-CM | POA: Diagnosis not present

## 2023-02-07 DIAGNOSIS — E1142 Type 2 diabetes mellitus with diabetic polyneuropathy: Secondary | ICD-10-CM | POA: Diagnosis not present

## 2023-02-07 DIAGNOSIS — N1831 Chronic kidney disease, stage 3a: Secondary | ICD-10-CM | POA: Diagnosis not present

## 2023-02-07 DIAGNOSIS — N390 Urinary tract infection, site not specified: Secondary | ICD-10-CM | POA: Diagnosis not present

## 2023-02-07 DIAGNOSIS — E119 Type 2 diabetes mellitus without complications: Secondary | ICD-10-CM | POA: Diagnosis not present

## 2023-02-07 DIAGNOSIS — Z1612 Extended spectrum beta lactamase (ESBL) resistance: Secondary | ICD-10-CM | POA: Diagnosis not present

## 2023-02-07 DIAGNOSIS — S2241XD Multiple fractures of ribs, right side, subsequent encounter for fracture with routine healing: Secondary | ICD-10-CM | POA: Diagnosis not present

## 2023-02-07 DIAGNOSIS — R4182 Altered mental status, unspecified: Secondary | ICD-10-CM | POA: Diagnosis not present

## 2023-02-07 DIAGNOSIS — I130 Hypertensive heart and chronic kidney disease with heart failure and stage 1 through stage 4 chronic kidney disease, or unspecified chronic kidney disease: Secondary | ICD-10-CM | POA: Diagnosis not present

## 2023-02-07 DIAGNOSIS — J449 Chronic obstructive pulmonary disease, unspecified: Secondary | ICD-10-CM | POA: Diagnosis present

## 2023-02-07 DIAGNOSIS — Z6832 Body mass index (BMI) 32.0-32.9, adult: Secondary | ICD-10-CM | POA: Diagnosis not present

## 2023-02-07 DIAGNOSIS — G928 Other toxic encephalopathy: Secondary | ICD-10-CM | POA: Diagnosis not present

## 2023-02-07 DIAGNOSIS — R2681 Unsteadiness on feet: Secondary | ICD-10-CM | POA: Diagnosis not present

## 2023-02-07 DIAGNOSIS — E1122 Type 2 diabetes mellitus with diabetic chronic kidney disease: Secondary | ICD-10-CM | POA: Diagnosis not present

## 2023-02-07 DIAGNOSIS — A419 Sepsis, unspecified organism: Secondary | ICD-10-CM | POA: Diagnosis not present

## 2023-02-07 DIAGNOSIS — I3481 Nonrheumatic mitral (valve) annulus calcification: Secondary | ICD-10-CM | POA: Diagnosis not present

## 2023-02-07 DIAGNOSIS — M4696 Unspecified inflammatory spondylopathy, lumbar region: Secondary | ICD-10-CM | POA: Diagnosis not present

## 2023-02-07 DIAGNOSIS — J99 Respiratory disorders in diseases classified elsewhere: Secondary | ICD-10-CM | POA: Diagnosis not present

## 2023-02-07 DIAGNOSIS — R7881 Bacteremia: Secondary | ICD-10-CM | POA: Diagnosis not present

## 2023-02-07 DIAGNOSIS — R0902 Hypoxemia: Secondary | ICD-10-CM | POA: Diagnosis not present

## 2023-02-07 DIAGNOSIS — R109 Unspecified abdominal pain: Secondary | ICD-10-CM | POA: Diagnosis not present

## 2023-02-07 DIAGNOSIS — R1312 Dysphagia, oropharyngeal phase: Secondary | ICD-10-CM | POA: Diagnosis not present

## 2023-02-07 DIAGNOSIS — Z794 Long term (current) use of insulin: Secondary | ICD-10-CM | POA: Diagnosis not present

## 2023-02-07 DIAGNOSIS — I679 Cerebrovascular disease, unspecified: Secondary | ICD-10-CM | POA: Diagnosis not present

## 2023-02-07 DIAGNOSIS — Z8673 Personal history of transient ischemic attack (TIA), and cerebral infarction without residual deficits: Secondary | ICD-10-CM | POA: Diagnosis not present

## 2023-02-07 DIAGNOSIS — R571 Hypovolemic shock: Secondary | ICD-10-CM | POA: Diagnosis not present

## 2023-02-07 LAB — GLUCOSE, CAPILLARY
Glucose-Capillary: 100 mg/dL — ABNORMAL HIGH (ref 70–99)
Glucose-Capillary: 252 mg/dL — ABNORMAL HIGH (ref 70–99)
Glucose-Capillary: 109 mg/dL — ABNORMAL HIGH (ref 70–99)
Glucose-Capillary: 92 mg/dL (ref 70–99)

## 2023-02-07 LAB — BASIC METABOLIC PANEL
Anion gap: 9 (ref 5–15)
BUN: 9 mg/dL (ref 8–23)
CO2: 22 mmol/L (ref 22–32)
Calcium: 8.2 mg/dL — ABNORMAL LOW (ref 8.9–10.3)
Chloride: 109 mmol/L (ref 98–111)
Creatinine, Ser: 0.74 mg/dL (ref 0.44–1.00)
GFR, Estimated: >60 mL/min (ref >60)
Glucose, Bld: 117 mg/dL — ABNORMAL HIGH (ref 70–99)
Potassium: 4.2 mmol/L (ref 3.5–5.1)
Sodium: 140 mmol/L (ref 135–145)

## 2023-02-07 LAB — CBC
HCT: 32.3 % — ABNORMAL LOW (ref 36.0–46.0)
Hemoglobin: 10.4 g/dL — ABNORMAL LOW (ref 12.0–15.0)
MCH: 29.7 pg (ref 26.0–34.0)
MCHC: 32.2 g/dL (ref 30.0–36.0)
MCV: 92.3 fL (ref 80.0–100.0)
Platelets: 359 10*3/uL (ref 150–400)
RBC: 3.5 MIL/uL — ABNORMAL LOW (ref 3.87–5.11)
RDW: 15.8 % — ABNORMAL HIGH (ref 11.5–15.5)
WBC: 10.4 10*3/uL (ref 4.0–10.5)
nRBC: 0 % (ref 0.0–0.2)

## 2023-02-07 MED ORDER — TORSEMIDE 100 MG PO TABS: 50.0000 mg | ORAL_TABLET | Freq: Every day | ORAL | 0 refills | Status: DC

## 2023-02-07 NOTE — Plan of Care (Signed)
Patient alert/oriented X4. Patient compliant with medication administration and finished her last dose of meropenum. Patient report called to Yvonna Alanis, RN at [facility] and PIV removed prior to discharge. AVS instructions explained in detail prior to patient leaving. Patient personal belongings packed up at bedside. Patient has been urinating frequently following removal of foley catheter.   Problem: Education: Goal: Ability to describe self-care measures that may prevent or decrease complications (Diabetes Survival Skills Education) will improve Outcome: Adequate for Discharge   Problem: Education: Goal: Individualized Educational Video(s) Outcome: Adequate for Discharge   Problem: Coping: Goal: Ability to adjust to condition or change in health will improve Outcome: Adequate for Discharge   Problem: Fluid Volume: Goal: Ability to maintain a balanced intake and output will improve Outcome: Adequate for Discharge   Problem: Health Behavior/Discharge Planning: Goal: Ability to identify and utilize available resources and services will improve Outcome: Adequate for Discharge   Problem: Health Behavior/Discharge Planning: Goal: Ability to manage health-related needs will improve Outcome: Adequate for Discharge   Problem: Metabolic: Goal: Ability to maintain appropriate glucose levels will improve Outcome: Adequate for Discharge   Problem: Nutritional: Goal: Maintenance of adequate nutrition will improve Outcome: Adequate for Discharge   Problem: Nutritional: Goal: Progress toward achieving an optimal weight will improve Outcome: Adequate for Discharge   Problem: Skin Integrity: Goal: Risk for impaired skin integrity will decrease Outcome: Adequate for Discharge   Problem: Tissue Perfusion: Goal: Adequacy of tissue perfusion will improve Outcome: Adequate for Discharge   Problem: Education: Goal: Knowledge of General Education information will improve Description:  Including pain rating scale, medication(s)/side effects and non-pharmacologic comfort measures Outcome: Adequate for Discharge   Problem: Health Behavior/Discharge Planning: Goal: Ability to manage health-related needs will improve Outcome: Adequate for Discharge   Problem: Clinical Measurements: Goal: Will remain free from infection Outcome: Adequate for Discharge   Problem: Clinical Measurements: Goal: Diagnostic test results will improve Outcome: Adequate for Discharge   Problem: Clinical Measurements: Goal: Respiratory complications will improve Outcome: Adequate for Discharge   Problem: Clinical Measurements: Goal: Cardiovascular complication will be avoided Outcome: Adequate for Discharge   Problem: Activity: Goal: Risk for activity intolerance will decrease Outcome: Adequate for Discharge   Problem: Nutrition: Goal: Adequate nutrition will be maintained Outcome: Adequate for Discharge   Problem: Coping: Goal: Level of anxiety will decrease Outcome: Adequate for Discharge   Problem: Elimination: Goal: Will not experience complications related to bowel motility Outcome: Adequate for Discharge   Problem: Elimination: Goal: Will not experience complications related to urinary retention Outcome: Adequate for Discharge   Problem: Pain Managment: Goal: General experience of comfort will improve Outcome: Adequate for Discharge   Problem: Safety: Goal: Ability to remain free from injury will improve Outcome: Adequate for Discharge   Problem: Skin Integrity: Goal: Risk for impaired skin integrity will decrease Outcome: Adequate for Discharge

## 2023-02-07 NOTE — TOC Transition Note (Signed)
Transition of Care [facility]) - CM/SW Discharge Note   Patient Details  Name: [name] MRN: [id number] Date of Birth: 1946-01-31  [facility]) CM/SW Contact:  Gerda Yin A [place], LCSWA Phone Number: 02/07/2023, 3:38 PM   Clinical Narrative:      Patient will DC to: [facility] and [facility]  Anticipated DC date: 02/07/23  Family notified: Maryjean Morn  Transport by: Sharin Mons   Per MD patient ready for DC to [facility] . RN, patient, patient's family, and facility notified of DC. Discharge Summary and FL2 sent to facility. RN to call report prior to discharge ( [phone], [phone]). DC packet on chart. Ambulance transport requested for patient.     CSW will sign off for now as social work intervention is no longer needed. Please consult [place] again if new needs arise.   Final next level of care: Skilled Nursing Facility Barriers to Discharge: Barriers Resolved   Patient Goals and CMS Choice      Discharge Placement                Patient chooses bed at: [facility] Patient to be transferred to facility by: [facility] Name of family member notified: JT Holthaus Patient and family notified of of transfer: 02/07/23  Discharge Plan and Services Additional resources added to the After Visit Summary for   In-house Referral: Clinical Social Work                                   Social Determinants of Health (SDOH) Interventions SDOH Screenings   Food Insecurity: No Food Insecurity (11/08/2022)  Housing: Low Risk  (11/08/2022)  Transportation Needs: No Transportation Needs (11/08/2022)  Utilities: Not At Risk (11/08/2022)  Alcohol Screen: Low Risk  (12/25/2018)  Depression (PHQ2-9): Low Risk  (12/25/2018)  Financial Resource Strain: Low Risk  (12/20/2018)  Physical Activity: Inactive (12/20/2018)  Social Connections: Moderately Integrated (12/25/2018)  Stress: Stress Concern Present (12/20/2018)  Tobacco Use: Low Risk  (01/28/2023)     Readmission  Risk Interventions     No data to display

## 2023-02-07 NOTE — Progress Notes (Signed)
Report called to Yvonna Alanis, RN at [facility] at [phone]  and RN is aware Sharin Mons will be here at 2:30 pm to pick her up for transport.

## 2023-02-07 NOTE — Discharge Summary (Addendum)
mouth daily.   torsemide 100 MG tablet Commonly known as: DEMADEX Take 0.5 tablets (50 mg total) by mouth daily. What changed: when to take this               Discharge Care Instructions  (From admission, onward)           Start     Ordered   02/07/23 0000  Discharge wound care:       Comments: See above   02/07/23 1234            Discharge Exam: Filed Weights   01/29/23 0322 01/30/23 0600 01/31/23 0500  Weight: 73.5 kg 82.9 kg 84.7 kg   General; Alert, conversant  Condition at discharge: stable  The results of significant diagnostics from this hospitalization (including imaging, microbiology, ancillary and laboratory) are listed below for reference.   Imaging Studies: DG Swallowing Func-Speech  Pathology  Result Date: 01/30/2023 Table formatting from the original result was not included. Modified Barium Swallow Study Patient Details Name: [name] MRN: [id number] Date of Birth: 08/21/1945 Today's Date: 01/30/2023 HPI/PMH: HPI: [name] is a 77 yo female presenting to ED 9/22 with three day history of AMS. CTH unremarkable. AMS thought to be secondary to sepsis. Recently admitted for syncopal episode resulting in rib fx and seen by SLP at that time with prolonged mastication and was recommended a regular diet with thin liquids. SLP at [facility] reports pt coughing intermittently with thin liquids and mixed consistency solids, which appears to resolve with use of a L head turn. SLP recommended an OP MBS, which was unable to be completed prior to current admission. PMH includes T2DM, HTN, HLD, RLS, HFpEF, COPD Clinical Impression: Clinical Impression: Pt presents with a moderate oropharyngeal dysphagia characterized primarily by mistiming. The swallow is consistently initiated at the level of the pyriform sinuses, which results in frank penetration that eventually progresses below the level of the vocal folds without sensation (PAS 8). [name] cued cough was ineffective in clearing aspirates and her vocal quality became increasingly wet in nature. A L head turn was not consistently effective at preventing boluses entering the airway, intermittently resulting in thin liquids reaching the level of the vocal folds (PAS  5) without being expelled. Pt appears to have improved bolus control with thicker consistencies. Nectar thick liquids resulted in trace, transient penetration (PAS 2), which can be considered an age-related variant. No further penetration/aspiration noted with trials of purees or regular texture solid textures, although note pt's dentures were not in place for the study, resulting in prolonged mastication. The pill was given whole in apple sauce with slow, yet complete clearance  during the esophageal sweep. Given subsequent trials of pureed and liquid boluses, esophageal retention was noted with apparent backflow below the level of the PES. This may warrant furhter esophageal assessment. Recommend initiating diet of Dys 3 texture solids with nectar thick liquids. Provided education regarding esophageal precautions to pt and RN. Suspect as pt is reconditioned and continues ST, she may demonstrate potential to advance diet and more consistently use compensatory strategies. Will continue to follow. Factors that may increase risk of adverse event in presence of aspiration Rubye Oaks & Clearance Coots 2021): Factors that may increase risk of adverse event in presence of aspiration Rubye Oaks & Clearance Coots 2021): Reduced cognitive function; Limited mobility; Frail or deconditioned; Dependence for feeding and/or oral hygiene; Weak cough Recommendations/Plan: Swallowing Evaluation Recommendations Swallowing Evaluation Recommendations Recommendations: PO diet PO Diet Recommendation: Dysphagia 3 (Mechanical soft);  mouth daily.   torsemide 100 MG tablet Commonly known as: DEMADEX Take 0.5 tablets (50 mg total) by mouth daily. What changed: when to take this               Discharge Care Instructions  (From admission, onward)           Start     Ordered   02/07/23 0000  Discharge wound care:       Comments: See above   02/07/23 1234            Discharge Exam: Filed Weights   01/29/23 0322 01/30/23 0600 01/31/23 0500  Weight: 73.5 kg 82.9 kg 84.7 kg   General; Alert, conversant  Condition at discharge: stable  The results of significant diagnostics from this hospitalization (including imaging, microbiology, ancillary and laboratory) are listed below for reference.   Imaging Studies: DG Swallowing Func-Speech  Pathology  Result Date: 01/30/2023 Table formatting from the original result was not included. Modified Barium Swallow Study Patient Details Name: [name] MRN: [id number] Date of Birth: 08/21/1945 Today's Date: 01/30/2023 HPI/PMH: HPI: [name] is a 77 yo female presenting to ED 9/22 with three day history of AMS. CTH unremarkable. AMS thought to be secondary to sepsis. Recently admitted for syncopal episode resulting in rib fx and seen by SLP at that time with prolonged mastication and was recommended a regular diet with thin liquids. SLP at [facility] reports pt coughing intermittently with thin liquids and mixed consistency solids, which appears to resolve with use of a L head turn. SLP recommended an OP MBS, which was unable to be completed prior to current admission. PMH includes T2DM, HTN, HLD, RLS, HFpEF, COPD Clinical Impression: Clinical Impression: Pt presents with a moderate oropharyngeal dysphagia characterized primarily by mistiming. The swallow is consistently initiated at the level of the pyriform sinuses, which results in frank penetration that eventually progresses below the level of the vocal folds without sensation (PAS 8). [name] cued cough was ineffective in clearing aspirates and her vocal quality became increasingly wet in nature. A L head turn was not consistently effective at preventing boluses entering the airway, intermittently resulting in thin liquids reaching the level of the vocal folds (PAS  5) without being expelled. Pt appears to have improved bolus control with thicker consistencies. Nectar thick liquids resulted in trace, transient penetration (PAS 2), which can be considered an age-related variant. No further penetration/aspiration noted with trials of purees or regular texture solid textures, although note pt's dentures were not in place for the study, resulting in prolonged mastication. The pill was given whole in apple sauce with slow, yet complete clearance  during the esophageal sweep. Given subsequent trials of pureed and liquid boluses, esophageal retention was noted with apparent backflow below the level of the PES. This may warrant furhter esophageal assessment. Recommend initiating diet of Dys 3 texture solids with nectar thick liquids. Provided education regarding esophageal precautions to pt and RN. Suspect as pt is reconditioned and continues ST, she may demonstrate potential to advance diet and more consistently use compensatory strategies. Will continue to follow. Factors that may increase risk of adverse event in presence of aspiration Rubye Oaks & Clearance Coots 2021): Factors that may increase risk of adverse event in presence of aspiration Rubye Oaks & Clearance Coots 2021): Reduced cognitive function; Limited mobility; Frail or deconditioned; Dependence for feeding and/or oral hygiene; Weak cough Recommendations/Plan: Swallowing Evaluation Recommendations Swallowing Evaluation Recommendations Recommendations: PO diet PO Diet Recommendation: Dysphagia 3 (Mechanical soft);  Physician Discharge Summary   Patient: [name] MRN: [id number] DOB: November 05, 1945  Admit date:     01/28/2023  Discharge date: 02/07/23  Discharge Physician: Alba Cory   PCP: Paulina Fusi, MD   Recommendations at discharge:   Monitor renal function and Hb  Discharge Diagnoses: Active Problems:   Acute pyelonephritis   Gram-negative bacteremia   Acute urinary retention   Chronic diastolic congestive heart failure (HCC)   Right 7-8th rib fractures   Splenic laceration   Bedbound   Hypertension   Dysphagia   HLD (hyperlipidemia)   COPD (chronic obstructive pulmonary disease) (HCC)   Goals of care, counseling/discussion   Hypokalemia   Pressure injury of skin   Hypophosphatemia   Hypomagnesemia   Hypocalcemia   DMII (diabetes mellitus, type 2) (HCC)   Constipation  Principal Problem (Resolved):   Septic shock (HCC) Resolved Problems:   Acute renal failure superimposed on stage 3a chronic kidney disease (HCC)   Wide-complex tachycardia  Hospital Course: 77 year old with past medical history significant for diabetes hypertension hyperlipidemia, COPD heart failure preserved ejection fraction initially presented to the ER 01/28/2023 with increased confusion for approximately 3 days.  Presents from [place]  [facility].  He was found to be mildly hypotensive, afebrile with mild leukocytosis.  CT abdomen and pelvis show hypoenhancement of both kidneys concerning for pyelonephritis and  mild urothelial thickening and renal pelvis and proximal ureter concerning for ascending UTI.  She subsequently developed worsening hypotension requiring vasopressor support and was admitted to the ICU.  Managed with meropenem.    Assessment and Plan: * Septic shock (HCC)-resolved as of 02/01/2023 - initial WBC 15.7 then became neutropenic followed by brisk leukocytosis (no steroids given) - lactic peaked at 3.3 and then downtrended -Blood cultures growing E. coli ESBL in 2/4 bottles (1  set) from 9/22; Urine culture growing same plus Pseudomonas  -Completed  meropenem; see bacteremia -off midodrine  Acute pyelonephritis - See septic shock - Treated with  meropenem  Gram-negative bacteremia Blood cultures growing E. coli ESBL in 2/4 bottles (1 set) from 9/22; Urine culture growing same plus Pseudomonas  -Continue meropenem; plan was 14 day course per ICU note, should be okay for completing 10 days course; (complete on 10/2 , plan to discharge today.   Acute renal failure superimposed on stage 3a chronic kidney disease (HCC)-resolved as of 02/01/2023 - patient has history of CKD3a. Baseline creat ~ 1., eGFR~ 57 - patient presents with increase in creat >0.3 mg/dL above baseline or creat increase >1.5x baseline presumed to have occurred within past 7 days PTA -Creatinine 1.67 on admission -resolved with fluids.    Acute urinary retention - foley removed 9/27 and patient failed TOV with accumulation on bladder scan - appreciate urology assistance on foley replacement on 9/28 - planning to leave in til at least 10/2 Voiding trial 10/02 successful.   Bedbound - Patient states wheelchair-bound/bedbound status for several years -Follow-up PT/OT evals - Resides at [facility]  Splenic laceration - per CT 9/22: "1.9 cm subcapsular linear hypodensity in the posterolateral midportion of the spleen. Grossly this has the appearance of a grade 2 laceration, but since there is no perisplenic or subcapsular hematoma or evidence of a free hemorrhage, this is most likely a late-subacute or otherwise contained injury." - Hgb stable  Right 7-8th rib fractures - per CT: "Subacute partially healed displaced fractures of the right posterolateral seventh and eighth ribs." Hb stable  Chronic diastolic congestive heart failure (HCC) - no  mouth daily.   torsemide 100 MG tablet Commonly known as: DEMADEX Take 0.5 tablets (50 mg total) by mouth daily. What changed: when to take this               Discharge Care Instructions  (From admission, onward)           Start     Ordered   02/07/23 0000  Discharge wound care:       Comments: See above   02/07/23 1234            Discharge Exam: Filed Weights   01/29/23 0322 01/30/23 0600 01/31/23 0500  Weight: 73.5 kg 82.9 kg 84.7 kg   General; Alert, conversant  Condition at discharge: stable  The results of significant diagnostics from this hospitalization (including imaging, microbiology, ancillary and laboratory) are listed below for reference.   Imaging Studies: DG Swallowing Func-Speech  Pathology  Result Date: 01/30/2023 Table formatting from the original result was not included. Modified Barium Swallow Study Patient Details Name: [name] MRN: [id number] Date of Birth: 08/21/1945 Today's Date: 01/30/2023 HPI/PMH: HPI: [name] is a 77 yo female presenting to ED 9/22 with three day history of AMS. CTH unremarkable. AMS thought to be secondary to sepsis. Recently admitted for syncopal episode resulting in rib fx and seen by SLP at that time with prolonged mastication and was recommended a regular diet with thin liquids. SLP at [facility] reports pt coughing intermittently with thin liquids and mixed consistency solids, which appears to resolve with use of a L head turn. SLP recommended an OP MBS, which was unable to be completed prior to current admission. PMH includes T2DM, HTN, HLD, RLS, HFpEF, COPD Clinical Impression: Clinical Impression: Pt presents with a moderate oropharyngeal dysphagia characterized primarily by mistiming. The swallow is consistently initiated at the level of the pyriform sinuses, which results in frank penetration that eventually progresses below the level of the vocal folds without sensation (PAS 8). [name] cued cough was ineffective in clearing aspirates and her vocal quality became increasingly wet in nature. A L head turn was not consistently effective at preventing boluses entering the airway, intermittently resulting in thin liquids reaching the level of the vocal folds (PAS  5) without being expelled. Pt appears to have improved bolus control with thicker consistencies. Nectar thick liquids resulted in trace, transient penetration (PAS 2), which can be considered an age-related variant. No further penetration/aspiration noted with trials of purees or regular texture solid textures, although note pt's dentures were not in place for the study, resulting in prolonged mastication. The pill was given whole in apple sauce with slow, yet complete clearance  during the esophageal sweep. Given subsequent trials of pureed and liquid boluses, esophageal retention was noted with apparent backflow below the level of the PES. This may warrant furhter esophageal assessment. Recommend initiating diet of Dys 3 texture solids with nectar thick liquids. Provided education regarding esophageal precautions to pt and RN. Suspect as pt is reconditioned and continues ST, she may demonstrate potential to advance diet and more consistently use compensatory strategies. Will continue to follow. Factors that may increase risk of adverse event in presence of aspiration Rubye Oaks & Clearance Coots 2021): Factors that may increase risk of adverse event in presence of aspiration Rubye Oaks & Clearance Coots 2021): Reduced cognitive function; Limited mobility; Frail or deconditioned; Dependence for feeding and/or oral hygiene; Weak cough Recommendations/Plan: Swallowing Evaluation Recommendations Swallowing Evaluation Recommendations Recommendations: PO diet PO Diet Recommendation: Dysphagia 3 (Mechanical soft);  Physician Discharge Summary   Patient: [name] MRN: [id number] DOB: November 05, 1945  Admit date:     01/28/2023  Discharge date: 02/07/23  Discharge Physician: Alba Cory   PCP: Paulina Fusi, MD   Recommendations at discharge:   Monitor renal function and Hb  Discharge Diagnoses: Active Problems:   Acute pyelonephritis   Gram-negative bacteremia   Acute urinary retention   Chronic diastolic congestive heart failure (HCC)   Right 7-8th rib fractures   Splenic laceration   Bedbound   Hypertension   Dysphagia   HLD (hyperlipidemia)   COPD (chronic obstructive pulmonary disease) (HCC)   Goals of care, counseling/discussion   Hypokalemia   Pressure injury of skin   Hypophosphatemia   Hypomagnesemia   Hypocalcemia   DMII (diabetes mellitus, type 2) (HCC)   Constipation  Principal Problem (Resolved):   Septic shock (HCC) Resolved Problems:   Acute renal failure superimposed on stage 3a chronic kidney disease (HCC)   Wide-complex tachycardia  Hospital Course: 77 year old with past medical history significant for diabetes hypertension hyperlipidemia, COPD heart failure preserved ejection fraction initially presented to the ER 01/28/2023 with increased confusion for approximately 3 days.  Presents from [place]  [facility].  He was found to be mildly hypotensive, afebrile with mild leukocytosis.  CT abdomen and pelvis show hypoenhancement of both kidneys concerning for pyelonephritis and  mild urothelial thickening and renal pelvis and proximal ureter concerning for ascending UTI.  She subsequently developed worsening hypotension requiring vasopressor support and was admitted to the ICU.  Managed with meropenem.    Assessment and Plan: * Septic shock (HCC)-resolved as of 02/01/2023 - initial WBC 15.7 then became neutropenic followed by brisk leukocytosis (no steroids given) - lactic peaked at 3.3 and then downtrended -Blood cultures growing E. coli ESBL in 2/4 bottles (1  set) from 9/22; Urine culture growing same plus Pseudomonas  -Completed  meropenem; see bacteremia -off midodrine  Acute pyelonephritis - See septic shock - Treated with  meropenem  Gram-negative bacteremia Blood cultures growing E. coli ESBL in 2/4 bottles (1 set) from 9/22; Urine culture growing same plus Pseudomonas  -Continue meropenem; plan was 14 day course per ICU note, should be okay for completing 10 days course; (complete on 10/2 , plan to discharge today.   Acute renal failure superimposed on stage 3a chronic kidney disease (HCC)-resolved as of 02/01/2023 - patient has history of CKD3a. Baseline creat ~ 1., eGFR~ 57 - patient presents with increase in creat >0.3 mg/dL above baseline or creat increase >1.5x baseline presumed to have occurred within past 7 days PTA -Creatinine 1.67 on admission -resolved with fluids.    Acute urinary retention - foley removed 9/27 and patient failed TOV with accumulation on bladder scan - appreciate urology assistance on foley replacement on 9/28 - planning to leave in til at least 10/2 Voiding trial 10/02 successful.   Bedbound - Patient states wheelchair-bound/bedbound status for several years -Follow-up PT/OT evals - Resides at [facility]  Splenic laceration - per CT 9/22: "1.9 cm subcapsular linear hypodensity in the posterolateral midportion of the spleen. Grossly this has the appearance of a grade 2 laceration, but since there is no perisplenic or subcapsular hematoma or evidence of a free hemorrhage, this is most likely a late-subacute or otherwise contained injury." - Hgb stable  Right 7-8th rib fractures - per CT: "Subacute partially healed displaced fractures of the right posterolateral seventh and eighth ribs." Hb stable  Chronic diastolic congestive heart failure (HCC) - no  mouth daily.   torsemide 100 MG tablet Commonly known as: DEMADEX Take 0.5 tablets (50 mg total) by mouth daily. What changed: when to take this               Discharge Care Instructions  (From admission, onward)           Start     Ordered   02/07/23 0000  Discharge wound care:       Comments: See above   02/07/23 1234            Discharge Exam: Filed Weights   01/29/23 0322 01/30/23 0600 01/31/23 0500  Weight: 73.5 kg 82.9 kg 84.7 kg   General; Alert, conversant  Condition at discharge: stable  The results of significant diagnostics from this hospitalization (including imaging, microbiology, ancillary and laboratory) are listed below for reference.   Imaging Studies: DG Swallowing Func-Speech  Pathology  Result Date: 01/30/2023 Table formatting from the original result was not included. Modified Barium Swallow Study Patient Details Name: [name] MRN: [id number] Date of Birth: 08/21/1945 Today's Date: 01/30/2023 HPI/PMH: HPI: [name] is a 77 yo female presenting to ED 9/22 with three day history of AMS. CTH unremarkable. AMS thought to be secondary to sepsis. Recently admitted for syncopal episode resulting in rib fx and seen by SLP at that time with prolonged mastication and was recommended a regular diet with thin liquids. SLP at [facility] reports pt coughing intermittently with thin liquids and mixed consistency solids, which appears to resolve with use of a L head turn. SLP recommended an OP MBS, which was unable to be completed prior to current admission. PMH includes T2DM, HTN, HLD, RLS, HFpEF, COPD Clinical Impression: Clinical Impression: Pt presents with a moderate oropharyngeal dysphagia characterized primarily by mistiming. The swallow is consistently initiated at the level of the pyriform sinuses, which results in frank penetration that eventually progresses below the level of the vocal folds without sensation (PAS 8). [name] cued cough was ineffective in clearing aspirates and her vocal quality became increasingly wet in nature. A L head turn was not consistently effective at preventing boluses entering the airway, intermittently resulting in thin liquids reaching the level of the vocal folds (PAS  5) without being expelled. Pt appears to have improved bolus control with thicker consistencies. Nectar thick liquids resulted in trace, transient penetration (PAS 2), which can be considered an age-related variant. No further penetration/aspiration noted with trials of purees or regular texture solid textures, although note pt's dentures were not in place for the study, resulting in prolonged mastication. The pill was given whole in apple sauce with slow, yet complete clearance  during the esophageal sweep. Given subsequent trials of pureed and liquid boluses, esophageal retention was noted with apparent backflow below the level of the PES. This may warrant furhter esophageal assessment. Recommend initiating diet of Dys 3 texture solids with nectar thick liquids. Provided education regarding esophageal precautions to pt and RN. Suspect as pt is reconditioned and continues ST, she may demonstrate potential to advance diet and more consistently use compensatory strategies. Will continue to follow. Factors that may increase risk of adverse event in presence of aspiration Rubye Oaks & Clearance Coots 2021): Factors that may increase risk of adverse event in presence of aspiration Rubye Oaks & Clearance Coots 2021): Reduced cognitive function; Limited mobility; Frail or deconditioned; Dependence for feeding and/or oral hygiene; Weak cough Recommendations/Plan: Swallowing Evaluation Recommendations Swallowing Evaluation Recommendations Recommendations: PO diet PO Diet Recommendation: Dysphagia 3 (Mechanical soft);  mouth daily.   torsemide 100 MG tablet Commonly known as: DEMADEX Take 0.5 tablets (50 mg total) by mouth daily. What changed: when to take this               Discharge Care Instructions  (From admission, onward)           Start     Ordered   02/07/23 0000  Discharge wound care:       Comments: See above   02/07/23 1234            Discharge Exam: Filed Weights   01/29/23 0322 01/30/23 0600 01/31/23 0500  Weight: 73.5 kg 82.9 kg 84.7 kg   General; Alert, conversant  Condition at discharge: stable  The results of significant diagnostics from this hospitalization (including imaging, microbiology, ancillary and laboratory) are listed below for reference.   Imaging Studies: DG Swallowing Func-Speech  Pathology  Result Date: 01/30/2023 Table formatting from the original result was not included. Modified Barium Swallow Study Patient Details Name: [name] MRN: [id number] Date of Birth: 08/21/1945 Today's Date: 01/30/2023 HPI/PMH: HPI: [name] is a 77 yo female presenting to ED 9/22 with three day history of AMS. CTH unremarkable. AMS thought to be secondary to sepsis. Recently admitted for syncopal episode resulting in rib fx and seen by SLP at that time with prolonged mastication and was recommended a regular diet with thin liquids. SLP at [facility] reports pt coughing intermittently with thin liquids and mixed consistency solids, which appears to resolve with use of a L head turn. SLP recommended an OP MBS, which was unable to be completed prior to current admission. PMH includes T2DM, HTN, HLD, RLS, HFpEF, COPD Clinical Impression: Clinical Impression: Pt presents with a moderate oropharyngeal dysphagia characterized primarily by mistiming. The swallow is consistently initiated at the level of the pyriform sinuses, which results in frank penetration that eventually progresses below the level of the vocal folds without sensation (PAS 8). [name] cued cough was ineffective in clearing aspirates and her vocal quality became increasingly wet in nature. A L head turn was not consistently effective at preventing boluses entering the airway, intermittently resulting in thin liquids reaching the level of the vocal folds (PAS  5) without being expelled. Pt appears to have improved bolus control with thicker consistencies. Nectar thick liquids resulted in trace, transient penetration (PAS 2), which can be considered an age-related variant. No further penetration/aspiration noted with trials of purees or regular texture solid textures, although note pt's dentures were not in place for the study, resulting in prolonged mastication. The pill was given whole in apple sauce with slow, yet complete clearance  during the esophageal sweep. Given subsequent trials of pureed and liquid boluses, esophageal retention was noted with apparent backflow below the level of the PES. This may warrant furhter esophageal assessment. Recommend initiating diet of Dys 3 texture solids with nectar thick liquids. Provided education regarding esophageal precautions to pt and RN. Suspect as pt is reconditioned and continues ST, she may demonstrate potential to advance diet and more consistently use compensatory strategies. Will continue to follow. Factors that may increase risk of adverse event in presence of aspiration Rubye Oaks & Clearance Coots 2021): Factors that may increase risk of adverse event in presence of aspiration Rubye Oaks & Clearance Coots 2021): Reduced cognitive function; Limited mobility; Frail or deconditioned; Dependence for feeding and/or oral hygiene; Weak cough Recommendations/Plan: Swallowing Evaluation Recommendations Swallowing Evaluation Recommendations Recommendations: PO diet PO Diet Recommendation: Dysphagia 3 (Mechanical soft);  mouth daily.   torsemide 100 MG tablet Commonly known as: DEMADEX Take 0.5 tablets (50 mg total) by mouth daily. What changed: when to take this               Discharge Care Instructions  (From admission, onward)           Start     Ordered   02/07/23 0000  Discharge wound care:       Comments: See above   02/07/23 1234            Discharge Exam: Filed Weights   01/29/23 0322 01/30/23 0600 01/31/23 0500  Weight: 73.5 kg 82.9 kg 84.7 kg   General; Alert, conversant  Condition at discharge: stable  The results of significant diagnostics from this hospitalization (including imaging, microbiology, ancillary and laboratory) are listed below for reference.   Imaging Studies: DG Swallowing Func-Speech  Pathology  Result Date: 01/30/2023 Table formatting from the original result was not included. Modified Barium Swallow Study Patient Details Name: [name] MRN: [id number] Date of Birth: 08/21/1945 Today's Date: 01/30/2023 HPI/PMH: HPI: [name] is a 77 yo female presenting to ED 9/22 with three day history of AMS. CTH unremarkable. AMS thought to be secondary to sepsis. Recently admitted for syncopal episode resulting in rib fx and seen by SLP at that time with prolonged mastication and was recommended a regular diet with thin liquids. SLP at [facility] reports pt coughing intermittently with thin liquids and mixed consistency solids, which appears to resolve with use of a L head turn. SLP recommended an OP MBS, which was unable to be completed prior to current admission. PMH includes T2DM, HTN, HLD, RLS, HFpEF, COPD Clinical Impression: Clinical Impression: Pt presents with a moderate oropharyngeal dysphagia characterized primarily by mistiming. The swallow is consistently initiated at the level of the pyriform sinuses, which results in frank penetration that eventually progresses below the level of the vocal folds without sensation (PAS 8). [name] cued cough was ineffective in clearing aspirates and her vocal quality became increasingly wet in nature. A L head turn was not consistently effective at preventing boluses entering the airway, intermittently resulting in thin liquids reaching the level of the vocal folds (PAS  5) without being expelled. Pt appears to have improved bolus control with thicker consistencies. Nectar thick liquids resulted in trace, transient penetration (PAS 2), which can be considered an age-related variant. No further penetration/aspiration noted with trials of purees or regular texture solid textures, although note pt's dentures were not in place for the study, resulting in prolonged mastication. The pill was given whole in apple sauce with slow, yet complete clearance  during the esophageal sweep. Given subsequent trials of pureed and liquid boluses, esophageal retention was noted with apparent backflow below the level of the PES. This may warrant furhter esophageal assessment. Recommend initiating diet of Dys 3 texture solids with nectar thick liquids. Provided education regarding esophageal precautions to pt and RN. Suspect as pt is reconditioned and continues ST, she may demonstrate potential to advance diet and more consistently use compensatory strategies. Will continue to follow. Factors that may increase risk of adverse event in presence of aspiration Rubye Oaks & Clearance Coots 2021): Factors that may increase risk of adverse event in presence of aspiration Rubye Oaks & Clearance Coots 2021): Reduced cognitive function; Limited mobility; Frail or deconditioned; Dependence for feeding and/or oral hygiene; Weak cough Recommendations/Plan: Swallowing Evaluation Recommendations Swallowing Evaluation Recommendations Recommendations: PO diet PO Diet Recommendation: Dysphagia 3 (Mechanical soft);  Physician Discharge Summary   Patient: [name] MRN: [id number] DOB: November 05, 1945  Admit date:     01/28/2023  Discharge date: 02/07/23  Discharge Physician: Alba Cory   PCP: Paulina Fusi, MD   Recommendations at discharge:   Monitor renal function and Hb  Discharge Diagnoses: Active Problems:   Acute pyelonephritis   Gram-negative bacteremia   Acute urinary retention   Chronic diastolic congestive heart failure (HCC)   Right 7-8th rib fractures   Splenic laceration   Bedbound   Hypertension   Dysphagia   HLD (hyperlipidemia)   COPD (chronic obstructive pulmonary disease) (HCC)   Goals of care, counseling/discussion   Hypokalemia   Pressure injury of skin   Hypophosphatemia   Hypomagnesemia   Hypocalcemia   DMII (diabetes mellitus, type 2) (HCC)   Constipation  Principal Problem (Resolved):   Septic shock (HCC) Resolved Problems:   Acute renal failure superimposed on stage 3a chronic kidney disease (HCC)   Wide-complex tachycardia  Hospital Course: 77 year old with past medical history significant for diabetes hypertension hyperlipidemia, COPD heart failure preserved ejection fraction initially presented to the ER 01/28/2023 with increased confusion for approximately 3 days.  Presents from [place]  place.  He was found to be mildly hypotensive, afebrile with mild leukocytosis.  CT abdomen and pelvis show hypoenhancement of both kidneys concerning for pyelonephritis and  mild urothelial thickening and renal pelvis and proximal ureter concerning for ascending UTI.  She subsequently developed worsening hypotension requiring vasopressor support and was admitted to the ICU.  Managed with meropenem.    Assessment and Plan: * Septic shock (HCC)-resolved as of 02/01/2023 - initial WBC 15.7 then became neutropenic followed by brisk leukocytosis (no steroids given) - lactic peaked at 3.3 and then downtrended -Blood cultures growing E. coli ESBL in 2/4 bottles (1  set) from 9/22; Urine culture growing same plus Pseudomonas  -Completed  meropenem; see bacteremia -off midodrine  Acute pyelonephritis - See septic shock - Treated with  meropenem  Gram-negative bacteremia Blood cultures growing E. coli ESBL in 2/4 bottles (1 set) from 9/22; Urine culture growing same plus Pseudomonas  -Continue meropenem; plan was 14 day course per ICU note, should be okay for completing 10 days course; (complete on 10/2 , plan to discharge today.   Acute renal failure superimposed on stage 3a chronic kidney disease (HCC)-resolved as of 02/01/2023 - patient has history of CKD3a. Baseline creat ~ 1., eGFR~ 57 - patient presents with increase in creat >0.3 mg/dL above baseline or creat increase >1.5x baseline presumed to have occurred within past 7 days PTA -Creatinine 1.67 on admission -resolved with fluids.    Acute urinary retention - foley removed 9/27 and patient failed TOV with accumulation on bladder scan - appreciate urology assistance on foley replacement on 9/28 - planning to leave in til at least 10/2 Voiding trial 10/02 successful.   Bedbound - Patient states wheelchair-bound/bedbound status for several years -Follow-up PT/OT evals - Resides at [facility]  Splenic laceration - per CT 9/22: "1.9 cm subcapsular linear hypodensity in the posterolateral midportion of the spleen. Grossly this has the appearance of a grade 2 laceration, but since there is no perisplenic or subcapsular hematoma or evidence of a free hemorrhage, this is most likely a late-subacute or otherwise contained injury." - Hgb stable  Right 7-8th rib fractures - per CT: "Subacute partially healed displaced fractures of the right posterolateral seventh and eighth ribs." Hb stable  Chronic diastolic congestive heart failure (HCC) - no  mouth daily.   torsemide 100 MG tablet Commonly known as: DEMADEX Take 0.5 tablets (50 mg total) by mouth daily. What changed: when to take this               Discharge Care Instructions  (From admission, onward)           Start     Ordered   02/07/23 0000  Discharge wound care:       Comments: See above   02/07/23 1234            Discharge Exam: Filed Weights   01/29/23 0322 01/30/23 0600 01/31/23 0500  Weight: 73.5 kg 82.9 kg 84.7 kg   General; Alert, conversant  Condition at discharge: stable  The results of significant diagnostics from this hospitalization (including imaging, microbiology, ancillary and laboratory) are listed below for reference.   Imaging Studies: DG Swallowing Func-Speech  Pathology  Result Date: 01/30/2023 Table formatting from the original result was not included. Modified Barium Swallow Study Patient Details Name: [name] MRN: [id number] Date of Birth: 08/21/1945 Today's Date: 01/30/2023 HPI/PMH: HPI: [name] is a 77 yo female presenting to ED 9/22 with three day history of AMS. CTH unremarkable. AMS thought to be secondary to sepsis. Recently admitted for syncopal episode resulting in rib fx and seen by SLP at that time with prolonged mastication and was recommended a regular diet with thin liquids. SLP at [facility] reports pt coughing intermittently with thin liquids and mixed consistency solids, which appears to resolve with use of a L head turn. SLP recommended an OP MBS, which was unable to be completed prior to current admission. PMH includes T2DM, HTN, HLD, RLS, HFpEF, COPD Clinical Impression: Clinical Impression: Pt presents with a moderate oropharyngeal dysphagia characterized primarily by mistiming. The swallow is consistently initiated at the level of the pyriform sinuses, which results in frank penetration that eventually progresses below the level of the vocal folds without sensation (PAS 8). [name] cued cough was ineffective in clearing aspirates and her vocal quality became increasingly wet in nature. A L head turn was not consistently effective at preventing boluses entering the airway, intermittently resulting in thin liquids reaching the level of the vocal folds (PAS  5) without being expelled. Pt appears to have improved bolus control with thicker consistencies. Nectar thick liquids resulted in trace, transient penetration (PAS 2), which can be considered an age-related variant. No further penetration/aspiration noted with trials of purees or regular texture solid textures, although note pt's dentures were not in place for the study, resulting in prolonged mastication. The pill was given whole in apple sauce with slow, yet complete clearance  during the esophageal sweep. Given subsequent trials of pureed and liquid boluses, esophageal retention was noted with apparent backflow below the level of the PES. This may warrant furhter esophageal assessment. Recommend initiating diet of Dys 3 texture solids with nectar thick liquids. Provided education regarding esophageal precautions to pt and RN. Suspect as pt is reconditioned and continues ST, she may demonstrate potential to advance diet and more consistently use compensatory strategies. Will continue to follow. Factors that may increase risk of adverse event in presence of aspiration Rubye Oaks & Clearance Coots 2021): Factors that may increase risk of adverse event in presence of aspiration Rubye Oaks & Clearance Coots 2021): Reduced cognitive function; Limited mobility; Frail or deconditioned; Dependence for feeding and/or oral hygiene; Weak cough Recommendations/Plan: Swallowing Evaluation Recommendations Swallowing Evaluation Recommendations Recommendations: PO diet PO Diet Recommendation: Dysphagia 3 (Mechanical soft);

## 2023-02-07 NOTE — Progress Notes (Signed)
Occupational Therapy Treatment Patient Details Name: [name] MRN: [id number] DOB: [birth year] Today's Date: 02/07/2023   History of present illness This is a 77 year old female presented with AMS the last 2-3 days. Found to have acute pyelonephritis and acute metabolic encephalopathy, septic shock. Splenic laceration on CT possibly from fall on 7/2 when she had rib fractures, hypotension on pressors. PHMx: T2DM, HTN, HLD, RLS, HFpEF, COPD, CVA.  Patient is a resident of [facility] since July 2024.   OT comments  Pt agreed to work with therapy at this time. Pt completed supine to sitting with increase in time and cues with moderate assist and pt agreed to attempt one sit to stand transfer with max assist-total assist but was unable to clear bed. Pt then reported they would like to be cleaned up at this time and required min for UE and max to total for LE while sitting at EOB. Mrs. Buice then agreed to remain sitting at EOB to eat breakfast and nursing was made aware. Acute Occupational Therapy will continue to follow patient.       If plan is discharge home, recommend the following:  Two people to help with walking and/or transfers;A lot of help with bathing/dressing/bathroom;Assistance with cooking/housework;Help with stairs or ramp for entrance;Assist for transportation;Direct supervision/assist for financial management;Direct supervision/assist for medications management   Equipment Recommendations  None recommended by OT    Recommendations for Other Services      Precautions / Restrictions Precautions Precautions: Fall Precaution Comments: watch BP, bring briefs; Contact precs Restrictions Weight Bearing Restrictions: No       Mobility Bed Mobility Overal bed mobility: Needs Assistance Bed Mobility: Rolling, Supine to Sit Rolling: Mod assist, Min assist, Used rails              Transfers Overall transfer level: Needs assistance Equipment used: Rolling walker (2  wheels) Transfers: Sit to/from Stand Sit to Stand: Max assist           General transfer comment: Pt reported they would attempt but then declined any further but did agree to sit at EOB to finish eating breakfast     Balance Overall balance assessment: Needs assistance Sitting-balance support: Feet supported, Bilateral upper extremity supported, No upper extremity supported Sitting balance-Leahy Scale: Good Sitting balance - Comments: able to sit while eating breakfast at EOB                                   ADL either performed or assessed with clinical judgement   ADL Overall ADL's : Needs assistance/impaired Eating/Feeding: Set up;Sitting Eating/Feeding Details (indicate cue type and reason): set up while sitting at EOB Grooming: Minimal assistance;Sitting Grooming Details (indicate cue type and reason): sitting at EOB Upper Body Bathing: Minimal assistance Upper Body Bathing Details (indicate cue type and reason): while sitting at EOB Lower Body Bathing: Maximal assistance;Total assistance;Bed level   Upper Body Dressing : Moderate assistance;Sitting   Lower Body Dressing: Maximal assistance;Total assistance;Bed level                      Extremity/Trunk Assessment Upper Extremity Assessment Upper Extremity Assessment: Generalized weakness RUE Deficits / Details: Limited AROM/PROM shoulder flexion; rest grossly 3/5 RUE Coordination: decreased gross motor LUE Deficits / Details: Limited AROM/PROM shoulder flexion; rest grossly 3/5 LUE Coordination: decreased gross motor   Lower Extremity Assessment Lower Extremity Assessment: Defer to PT evaluation  Vision       Perception     Praxis      Cognition Arousal: Alert Behavior During Therapy: Flat affect Overall Cognitive Status: No family/caregiver present to determine baseline cognitive functioning                                 General Comments: slow to  process        Exercises      Shoulder Instructions       General Comments      Pertinent Vitals/ Pain       Pain Assessment Pain Assessment: Faces Faces Pain Scale: Hurts a little bit Pain Location: sore generalized Pain Descriptors / Indicators: Sore Pain Intervention(s): Limited activity within patient's tolerance  Home Living                                          Prior Functioning/Environment              Frequency  Min 1X/week        Progress Toward Goals  OT Goals(current goals can now be found in the care plan section)  Progress towards OT goals: Progressing toward goals  Acute Rehab OT Goals Patient Stated Goal: to go back to SNF OT Goal Formulation: With patient Time For Goal Achievement: 02/13/23 Potential to Achieve Goals: Good ADL Goals Pt Will Perform Eating: with set-up;with adaptive utensils;sitting Pt Will Perform Grooming: with set-up;sitting Pt Will Transfer to Toilet: with min assist;bedside commode Additional ADL Goal #1: Pt will be min A in and OOB for basic ADLs  Plan      Co-evaluation                 AM-PAC OT "6 Clicks" Daily Activity     Outcome Measure   Help from another person eating meals?: A Little Help from another person taking care of personal grooming?: A Little Help from another person toileting, which includes using toliet, bedpan, or urinal?: Total Help from another person bathing (including washing, rinsing, drying)?: Total Help from another person to put on and taking off regular upper body clothing?: A Little Help from another person to put on and taking off regular lower body clothing?: Total 6 Click Score: 12    End of Session Equipment Utilized During Treatment: Gait belt;Rolling walker (2 wheels)  OT Visit Diagnosis: Other abnormalities of gait and mobility (R26.89);Muscle weakness (generalized) (M62.81)   Activity Tolerance Patient tolerated treatment well   Patient  Left in bed;with call bell/phone within reach;with bed alarm set   Nurse Communication Mobility status (Pt sitting at EOB)        Time: 5284-1324 OT Time Calculation (min): 36 min  Charges: OT General Charges $OT Visit: 1 Visit OT Treatments $Self Care/Home Management : 23-37 mins  Presley Raddle OTR/L  Acute Rehab Services  [phone] office number   Alphia Moh 02/07/2023, 9:49 AM

## 2023-02-08 DIAGNOSIS — J449 Chronic obstructive pulmonary disease, unspecified: Secondary | ICD-10-CM | POA: Diagnosis not present

## 2023-02-08 DIAGNOSIS — E119 Type 2 diabetes mellitus without complications: Secondary | ICD-10-CM | POA: Diagnosis not present

## 2023-02-08 DIAGNOSIS — J99 Respiratory disorders in diseases classified elsewhere: Secondary | ICD-10-CM | POA: Diagnosis not present

## 2023-02-08 DIAGNOSIS — I5032 Chronic diastolic (congestive) heart failure: Secondary | ICD-10-CM | POA: Diagnosis not present

## 2023-02-08 DIAGNOSIS — I2510 Atherosclerotic heart disease of native coronary artery without angina pectoris: Secondary | ICD-10-CM | POA: Diagnosis not present

## 2023-02-08 DIAGNOSIS — R32 Unspecified urinary incontinence: Secondary | ICD-10-CM | POA: Diagnosis not present

## 2023-02-08 DIAGNOSIS — I110 Hypertensive heart disease with heart failure: Secondary | ICD-10-CM | POA: Diagnosis not present

## 2023-02-08 DIAGNOSIS — R6521 Severe sepsis with septic shock: Secondary | ICD-10-CM | POA: Diagnosis not present

## 2023-02-08 DIAGNOSIS — A419 Sepsis, unspecified organism: Secondary | ICD-10-CM | POA: Diagnosis not present

## 2023-02-08 DIAGNOSIS — N10 Acute tubulo-interstitial nephritis: Secondary | ICD-10-CM | POA: Diagnosis not present

## 2023-02-08 DIAGNOSIS — E785 Hyperlipidemia, unspecified: Secondary | ICD-10-CM | POA: Diagnosis not present

## 2023-02-08 DIAGNOSIS — S2241XD Multiple fractures of ribs, right side, subsequent encounter for fracture with routine healing: Secondary | ICD-10-CM | POA: Diagnosis not present

## 2023-02-08 DIAGNOSIS — R1310 Dysphagia, unspecified: Secondary | ICD-10-CM | POA: Diagnosis not present

## 2023-02-08 DIAGNOSIS — M4696 Unspecified inflammatory spondylopathy, lumbar region: Secondary | ICD-10-CM | POA: Diagnosis not present

## 2023-02-08 DIAGNOSIS — Z8673 Personal history of transient ischemic attack (TIA), and cerebral infarction without residual deficits: Secondary | ICD-10-CM | POA: Diagnosis not present

## 2023-02-08 DIAGNOSIS — Z794 Long term (current) use of insulin: Secondary | ICD-10-CM | POA: Diagnosis not present

## 2023-02-08 DIAGNOSIS — Z8619 Personal history of other infectious and parasitic diseases: Secondary | ICD-10-CM | POA: Diagnosis not present

## 2023-02-08 DIAGNOSIS — L89309 Pressure ulcer of unspecified buttock, unspecified stage: Secondary | ICD-10-CM | POA: Diagnosis not present

## 2023-02-08 DIAGNOSIS — Z7902 Long term (current) use of antithrombotics/antiplatelets: Secondary | ICD-10-CM | POA: Diagnosis not present

## 2023-02-08 DIAGNOSIS — Z9181 History of falling: Secondary | ICD-10-CM | POA: Diagnosis not present

## 2023-02-09 ENCOUNTER — Telehealth: Payer: Self-pay | Admitting: *Deleted

## 2023-02-09 NOTE — Progress Notes (Signed)
Care Coordination Note  02/09/2023 Name: [name] MRN: [id number] DOB: 1946-04-17  [name] is a 77 y.o. year old female who is a primary care patient of Paulina Fusi, MD and is actively engaged with the care management team. I reached out to Florene Route by phone today to assist with re-scheduling an initial visit with the RN Case Manager  Follow up plan: Pt d/c from hospital 10/2 - spoke with husband who says pt is in rehab at [facility] place - agreeable for follow up call in a month   Burman Nieves, [facility] Care Coordination Care Guide Direct Dial: [phone]

## 2023-02-14 ENCOUNTER — Telehealth: Payer: Self-pay | Admitting: *Deleted

## 2023-02-14 DIAGNOSIS — I69820 Aphasia following other cerebrovascular disease: Secondary | ICD-10-CM | POA: Diagnosis not present

## 2023-02-14 DIAGNOSIS — I5032 Chronic diastolic (congestive) heart failure: Secondary | ICD-10-CM | POA: Diagnosis not present

## 2023-02-14 DIAGNOSIS — E119 Type 2 diabetes mellitus without complications: Secondary | ICD-10-CM | POA: Diagnosis not present

## 2023-02-14 DIAGNOSIS — R410 Disorientation, unspecified: Secondary | ICD-10-CM | POA: Diagnosis not present

## 2023-02-14 DIAGNOSIS — R102 Pelvic and perineal pain: Secondary | ICD-10-CM | POA: Diagnosis not present

## 2023-02-14 DIAGNOSIS — M6281 Muscle weakness (generalized): Secondary | ICD-10-CM | POA: Diagnosis not present

## 2023-02-14 DIAGNOSIS — I679 Cerebrovascular disease, unspecified: Secondary | ICD-10-CM | POA: Diagnosis not present

## 2023-02-14 DIAGNOSIS — Z87448 Personal history of other diseases of urinary system: Secondary | ICD-10-CM | POA: Diagnosis not present

## 2023-02-14 DIAGNOSIS — Z9181 History of falling: Secondary | ICD-10-CM | POA: Diagnosis not present

## 2023-02-14 DIAGNOSIS — R2681 Unsteadiness on feet: Secondary | ICD-10-CM | POA: Diagnosis not present

## 2023-02-14 DIAGNOSIS — Z794 Long term (current) use of insulin: Secondary | ICD-10-CM | POA: Diagnosis not present

## 2023-02-14 DIAGNOSIS — G9341 Metabolic encephalopathy: Secondary | ICD-10-CM | POA: Diagnosis not present

## 2023-02-14 NOTE — Telephone Encounter (Signed)
Left message for husband to call me back about heart monitor put on pt back in June, 2024. It states in the [place] documentation that the monitor was lost, just calling to verify that they did send it back.

## 2023-02-14 NOTE — Telephone Encounter (Signed)
Spoke with husband and he let me know he had mailed the monitor back so it must have gotten lost in transit. Pt was in hospital recently and now is back at [facility] center. Husband went to see her yesterday and she didn't know who he was.

## 2023-02-15 DIAGNOSIS — E1165 Type 2 diabetes mellitus with hyperglycemia: Secondary | ICD-10-CM | POA: Diagnosis not present

## 2023-02-15 DIAGNOSIS — R109 Unspecified abdominal pain: Secondary | ICD-10-CM | POA: Diagnosis not present

## 2023-02-15 DIAGNOSIS — Z794 Long term (current) use of insulin: Secondary | ICD-10-CM | POA: Diagnosis not present

## 2023-02-19 ENCOUNTER — Ambulatory Visit (INDEPENDENT_AMBULATORY_CARE_PROVIDER_SITE_OTHER): Payer: Medicare Other

## 2023-02-19 DIAGNOSIS — R55 Syncope and collapse: Secondary | ICD-10-CM | POA: Diagnosis not present

## 2023-02-20 DIAGNOSIS — Z9181 History of falling: Secondary | ICD-10-CM | POA: Diagnosis not present

## 2023-02-20 DIAGNOSIS — I679 Cerebrovascular disease, unspecified: Secondary | ICD-10-CM | POA: Diagnosis not present

## 2023-02-20 DIAGNOSIS — M6281 Muscle weakness (generalized): Secondary | ICD-10-CM | POA: Diagnosis not present

## 2023-02-20 DIAGNOSIS — I69820 Aphasia following other cerebrovascular disease: Secondary | ICD-10-CM | POA: Diagnosis not present

## 2023-02-20 DIAGNOSIS — G9341 Metabolic encephalopathy: Secondary | ICD-10-CM | POA: Diagnosis not present

## 2023-02-20 DIAGNOSIS — R2681 Unsteadiness on feet: Secondary | ICD-10-CM | POA: Diagnosis not present

## 2023-02-20 LAB — CUP PACEART REMOTE DEVICE CHECK
Date Time Interrogation Session: 20241013231025
Implantable Pulse Generator Implant Date: 20240703

## 2023-02-22 DIAGNOSIS — K2960 Other gastritis without bleeding: Secondary | ICD-10-CM | POA: Diagnosis not present

## 2023-02-22 DIAGNOSIS — N390 Urinary tract infection, site not specified: Secondary | ICD-10-CM | POA: Diagnosis not present

## 2023-02-23 DIAGNOSIS — G9341 Metabolic encephalopathy: Secondary | ICD-10-CM | POA: Diagnosis not present

## 2023-02-23 DIAGNOSIS — K219 Gastro-esophageal reflux disease without esophagitis: Secondary | ICD-10-CM | POA: Diagnosis not present

## 2023-02-23 DIAGNOSIS — N390 Urinary tract infection, site not specified: Secondary | ICD-10-CM | POA: Diagnosis not present

## 2023-02-26 ENCOUNTER — Telehealth: Payer: Self-pay

## 2023-02-26 DIAGNOSIS — M6281 Muscle weakness (generalized): Secondary | ICD-10-CM | POA: Diagnosis not present

## 2023-02-26 DIAGNOSIS — G9341 Metabolic encephalopathy: Secondary | ICD-10-CM | POA: Diagnosis not present

## 2023-02-26 DIAGNOSIS — Z9181 History of falling: Secondary | ICD-10-CM | POA: Diagnosis not present

## 2023-02-26 DIAGNOSIS — I69820 Aphasia following other cerebrovascular disease: Secondary | ICD-10-CM | POA: Diagnosis not present

## 2023-02-26 DIAGNOSIS — R2681 Unsteadiness on feet: Secondary | ICD-10-CM | POA: Diagnosis not present

## 2023-02-26 DIAGNOSIS — I679 Cerebrovascular disease, unspecified: Secondary | ICD-10-CM | POA: Diagnosis not present

## 2023-02-26 NOTE — Telephone Encounter (Signed)
Following alert received from [employer] received for 1 AF classified event on 02/23/23 at 22:39, 1 hr 50 min, irregular V-V intervals c/w atrial arrhythmia, average V rate 83 bpm, occasional PVC, not on OAC per Epic.  Implant inndication of Syncope; pt has a hx of CVA.  Patient currently is at [facility] [phone].  Attempted to contact facility to advise AF clinic referral recommended. No answer, unable to leave VM.

## 2023-02-27 NOTE — Telephone Encounter (Signed)
10/22 LM

## 2023-02-27 NOTE — Telephone Encounter (Signed)
Attempted to contact patient. No answer,LMTCB 

## 2023-02-28 NOTE — Telephone Encounter (Signed)
Alert received 10/23- [employer] alert received. 1 AF classified event on 02/27/23 at 16:35, 20 min, irregular V-V intervals c/w atrial arrhythmia, average V rate 88 bpm, occasional PVC, not on OAC per Epic. Per 02/26/23 Telephone Encounter in Epic clinic is attempting to contact pt with referral for AF Clinic. Implant indication of Syncope; pt has a hx of CVA, routed to Triage for review.  Follow up as scheduled. MC, [facility]

## 2023-03-01 DIAGNOSIS — I69820 Aphasia following other cerebrovascular disease: Secondary | ICD-10-CM | POA: Diagnosis not present

## 2023-03-01 DIAGNOSIS — M6281 Muscle weakness (generalized): Secondary | ICD-10-CM | POA: Diagnosis not present

## 2023-03-01 DIAGNOSIS — R2681 Unsteadiness on feet: Secondary | ICD-10-CM | POA: Diagnosis not present

## 2023-03-01 DIAGNOSIS — Z9181 History of falling: Secondary | ICD-10-CM | POA: Diagnosis not present

## 2023-03-01 DIAGNOSIS — G9341 Metabolic encephalopathy: Secondary | ICD-10-CM | POA: Diagnosis not present

## 2023-03-01 DIAGNOSIS — I679 Cerebrovascular disease, unspecified: Secondary | ICD-10-CM | POA: Diagnosis not present

## 2023-03-01 NOTE — Telephone Encounter (Signed)
Left message on [name] home phone requesting call back.  Left message on son's phone requesting call back.

## 2023-03-02 ENCOUNTER — Emergency Department (HOSPITAL_COMMUNITY): Payer: Medicare Other

## 2023-03-02 ENCOUNTER — Encounter (HOSPITAL_COMMUNITY): Payer: Self-pay | Admitting: Emergency Medicine

## 2023-03-02 ENCOUNTER — Inpatient Hospital Stay (HOSPITAL_COMMUNITY)
Admission: EM | Admit: 2023-03-02 | Discharge: 2023-03-08 | DRG: 871 | Disposition: A | Discharge status: Skilled Nursing Facility | Payer: Medicare Other | Source: Skilled Nursing Facility | Attending: Family Medicine | Admitting: Family Medicine

## 2023-03-02 DIAGNOSIS — Z833 Family history of diabetes mellitus: Secondary | ICD-10-CM

## 2023-03-02 DIAGNOSIS — Z7982 Long term (current) use of aspirin: Secondary | ICD-10-CM

## 2023-03-02 DIAGNOSIS — R2681 Unsteadiness on feet: Secondary | ICD-10-CM | POA: Diagnosis not present

## 2023-03-02 DIAGNOSIS — I959 Hypotension, unspecified: Secondary | ICD-10-CM | POA: Diagnosis not present

## 2023-03-02 DIAGNOSIS — I5032 Chronic diastolic (congestive) heart failure: Secondary | ICD-10-CM | POA: Diagnosis not present

## 2023-03-02 DIAGNOSIS — G9341 Metabolic encephalopathy: Secondary | ICD-10-CM | POA: Diagnosis present

## 2023-03-02 DIAGNOSIS — I4891 Unspecified atrial fibrillation: Secondary | ICD-10-CM | POA: Diagnosis present

## 2023-03-02 DIAGNOSIS — I482 Chronic atrial fibrillation, unspecified: Secondary | ICD-10-CM | POA: Diagnosis not present

## 2023-03-02 DIAGNOSIS — R1312 Dysphagia, oropharyngeal phase: Secondary | ICD-10-CM | POA: Diagnosis not present

## 2023-03-02 DIAGNOSIS — R4182 Altered mental status, unspecified: Secondary | ICD-10-CM | POA: Diagnosis not present

## 2023-03-02 DIAGNOSIS — R404 Transient alteration of awareness: Secondary | ICD-10-CM | POA: Diagnosis not present

## 2023-03-02 DIAGNOSIS — R571 Hypovolemic shock: Secondary | ICD-10-CM | POA: Diagnosis not present

## 2023-03-02 DIAGNOSIS — E669 Obesity, unspecified: Secondary | ICD-10-CM | POA: Diagnosis present

## 2023-03-02 DIAGNOSIS — R41841 Cognitive communication deficit: Secondary | ICD-10-CM | POA: Diagnosis not present

## 2023-03-02 DIAGNOSIS — J9600 Acute respiratory failure, unspecified whether with hypoxia or hypercapnia: Secondary | ICD-10-CM | POA: Diagnosis not present

## 2023-03-02 DIAGNOSIS — R5381 Other malaise: Secondary | ICD-10-CM | POA: Diagnosis not present

## 2023-03-02 DIAGNOSIS — E119 Type 2 diabetes mellitus without complications: Secondary | ICD-10-CM | POA: Diagnosis not present

## 2023-03-02 DIAGNOSIS — R0902 Hypoxemia: Secondary | ICD-10-CM | POA: Diagnosis not present

## 2023-03-02 DIAGNOSIS — G928 Other toxic encephalopathy: Secondary | ICD-10-CM | POA: Diagnosis not present

## 2023-03-02 DIAGNOSIS — A4151 Sepsis due to Escherichia coli [E. coli]: Principal | ICD-10-CM | POA: Diagnosis present

## 2023-03-02 DIAGNOSIS — E440 Moderate protein-calorie malnutrition: Secondary | ICD-10-CM | POA: Diagnosis not present

## 2023-03-02 DIAGNOSIS — N136 Pyonephrosis: Secondary | ICD-10-CM | POA: Diagnosis not present

## 2023-03-02 DIAGNOSIS — K5641 Fecal impaction: Secondary | ICD-10-CM | POA: Diagnosis not present

## 2023-03-02 DIAGNOSIS — R319 Hematuria, unspecified: Secondary | ICD-10-CM | POA: Diagnosis not present

## 2023-03-02 DIAGNOSIS — I3481 Nonrheumatic mitral (valve) annulus calcification: Secondary | ICD-10-CM | POA: Diagnosis not present

## 2023-03-02 DIAGNOSIS — Z6832 Body mass index (BMI) 32.0-32.9, adult: Secondary | ICD-10-CM

## 2023-03-02 DIAGNOSIS — Z7409 Other reduced mobility: Secondary | ICD-10-CM | POA: Diagnosis not present

## 2023-03-02 DIAGNOSIS — Z1612 Extended spectrum beta lactamase (ESBL) resistance: Secondary | ICD-10-CM | POA: Diagnosis present

## 2023-03-02 DIAGNOSIS — R6521 Severe sepsis with septic shock: Secondary | ICD-10-CM | POA: Diagnosis present

## 2023-03-02 DIAGNOSIS — I130 Hypertensive heart and chronic kidney disease with heart failure and stage 1 through stage 4 chronic kidney disease, or unspecified chronic kidney disease: Secondary | ICD-10-CM | POA: Diagnosis present

## 2023-03-02 DIAGNOSIS — D631 Anemia in chronic kidney disease: Secondary | ICD-10-CM | POA: Diagnosis present

## 2023-03-02 DIAGNOSIS — E785 Hyperlipidemia, unspecified: Secondary | ICD-10-CM | POA: Diagnosis present

## 2023-03-02 DIAGNOSIS — E1122 Type 2 diabetes mellitus with diabetic chronic kidney disease: Secondary | ICD-10-CM | POA: Diagnosis not present

## 2023-03-02 DIAGNOSIS — N1831 Chronic kidney disease, stage 3a: Secondary | ICD-10-CM | POA: Diagnosis not present

## 2023-03-02 DIAGNOSIS — N390 Urinary tract infection, site not specified: Secondary | ICD-10-CM | POA: Diagnosis not present

## 2023-03-02 DIAGNOSIS — Z1152 Encounter for screening for COVID-19: Secondary | ICD-10-CM

## 2023-03-02 DIAGNOSIS — R935 Abnormal findings on diagnostic imaging of other abdominal regions, including retroperitoneum: Secondary | ICD-10-CM | POA: Diagnosis not present

## 2023-03-02 DIAGNOSIS — R2689 Other abnormalities of gait and mobility: Secondary | ICD-10-CM | POA: Diagnosis not present

## 2023-03-02 DIAGNOSIS — A498 Other bacterial infections of unspecified site: Secondary | ICD-10-CM | POA: Diagnosis not present

## 2023-03-02 DIAGNOSIS — Z90710 Acquired absence of both cervix and uterus: Secondary | ICD-10-CM

## 2023-03-02 DIAGNOSIS — L03317 Cellulitis of buttock: Secondary | ICD-10-CM | POA: Diagnosis not present

## 2023-03-02 DIAGNOSIS — Z7902 Long term (current) use of antithrombotics/antiplatelets: Secondary | ICD-10-CM

## 2023-03-02 DIAGNOSIS — Z8673 Personal history of transient ischemic attack (TIA), and cerebral infarction without residual deficits: Secondary | ICD-10-CM

## 2023-03-02 DIAGNOSIS — N1330 Unspecified hydronephrosis: Secondary | ICD-10-CM | POA: Diagnosis not present

## 2023-03-02 DIAGNOSIS — L0231 Cutaneous abscess of buttock: Secondary | ICD-10-CM | POA: Diagnosis not present

## 2023-03-02 DIAGNOSIS — Z888 Allergy status to other drugs, medicaments and biological substances status: Secondary | ICD-10-CM

## 2023-03-02 DIAGNOSIS — R1310 Dysphagia, unspecified: Secondary | ICD-10-CM | POA: Diagnosis not present

## 2023-03-02 DIAGNOSIS — Z79899 Other long term (current) drug therapy: Secondary | ICD-10-CM

## 2023-03-02 DIAGNOSIS — L89316 Pressure-induced deep tissue damage of right buttock: Secondary | ICD-10-CM | POA: Diagnosis present

## 2023-03-02 DIAGNOSIS — E118 Type 2 diabetes mellitus with unspecified complications: Secondary | ICD-10-CM | POA: Diagnosis not present

## 2023-03-02 DIAGNOSIS — I872 Venous insufficiency (chronic) (peripheral): Secondary | ICD-10-CM | POA: Diagnosis present

## 2023-03-02 DIAGNOSIS — N138 Other obstructive and reflux uropathy: Secondary | ICD-10-CM | POA: Diagnosis not present

## 2023-03-02 DIAGNOSIS — T40605A Adverse effect of unspecified narcotics, initial encounter: Secondary | ICD-10-CM | POA: Diagnosis not present

## 2023-03-02 DIAGNOSIS — F05 Delirium due to known physiological condition: Secondary | ICD-10-CM | POA: Diagnosis present

## 2023-03-02 DIAGNOSIS — B9620 Unspecified Escherichia coli [E. coli] as the cause of diseases classified elsewhere: Secondary | ICD-10-CM | POA: Diagnosis not present

## 2023-03-02 DIAGNOSIS — M6259 Muscle wasting and atrophy, not elsewhere classified, multiple sites: Secondary | ICD-10-CM | POA: Diagnosis not present

## 2023-03-02 DIAGNOSIS — R000 Tachycardia, unspecified: Secondary | ICD-10-CM | POA: Diagnosis not present

## 2023-03-02 DIAGNOSIS — R0689 Other abnormalities of breathing: Secondary | ICD-10-CM | POA: Diagnosis not present

## 2023-03-02 DIAGNOSIS — G9340 Encephalopathy, unspecified: Secondary | ICD-10-CM | POA: Diagnosis not present

## 2023-03-02 DIAGNOSIS — K5900 Constipation, unspecified: Secondary | ICD-10-CM | POA: Diagnosis not present

## 2023-03-02 DIAGNOSIS — B9629 Other Escherichia coli [E. coli] as the cause of diseases classified elsewhere: Secondary | ICD-10-CM | POA: Diagnosis not present

## 2023-03-02 DIAGNOSIS — Z66 Do not resuscitate: Secondary | ICD-10-CM | POA: Diagnosis present

## 2023-03-02 DIAGNOSIS — M810 Age-related osteoporosis without current pathological fracture: Secondary | ICD-10-CM | POA: Diagnosis present

## 2023-03-02 DIAGNOSIS — R262 Difficulty in walking, not elsewhere classified: Secondary | ICD-10-CM | POA: Diagnosis not present

## 2023-03-02 DIAGNOSIS — E1142 Type 2 diabetes mellitus with diabetic polyneuropathy: Secondary | ICD-10-CM | POA: Diagnosis not present

## 2023-03-02 DIAGNOSIS — J449 Chronic obstructive pulmonary disease, unspecified: Secondary | ICD-10-CM | POA: Diagnosis present

## 2023-03-02 DIAGNOSIS — E876 Hypokalemia: Secondary | ICD-10-CM | POA: Diagnosis present

## 2023-03-02 DIAGNOSIS — N179 Acute kidney failure, unspecified: Secondary | ICD-10-CM | POA: Diagnosis not present

## 2023-03-02 DIAGNOSIS — Z7401 Bed confinement status: Secondary | ICD-10-CM | POA: Diagnosis not present

## 2023-03-02 DIAGNOSIS — T368X5A Adverse effect of other systemic antibiotics, initial encounter: Secondary | ICD-10-CM | POA: Diagnosis not present

## 2023-03-02 DIAGNOSIS — Z794 Long term (current) use of insulin: Secondary | ICD-10-CM

## 2023-03-02 DIAGNOSIS — L89326 Pressure-induced deep tissue damage of left buttock: Secondary | ICD-10-CM | POA: Diagnosis present

## 2023-03-02 DIAGNOSIS — N3289 Other specified disorders of bladder: Secondary | ICD-10-CM | POA: Diagnosis not present

## 2023-03-02 DIAGNOSIS — A419 Sepsis, unspecified organism: Secondary | ICD-10-CM | POA: Diagnosis present

## 2023-03-02 DIAGNOSIS — Z823 Family history of stroke: Secondary | ICD-10-CM

## 2023-03-02 DIAGNOSIS — R296 Repeated falls: Secondary | ICD-10-CM | POA: Diagnosis not present

## 2023-03-02 DIAGNOSIS — Z8249 Family history of ischemic heart disease and other diseases of the circulatory system: Secondary | ICD-10-CM

## 2023-03-02 DIAGNOSIS — R7881 Bacteremia: Secondary | ICD-10-CM | POA: Diagnosis not present

## 2023-03-02 DIAGNOSIS — D649 Anemia, unspecified: Secondary | ICD-10-CM | POA: Diagnosis not present

## 2023-03-02 DIAGNOSIS — Z8619 Personal history of other infectious and parasitic diseases: Secondary | ICD-10-CM

## 2023-03-02 LAB — URINALYSIS, W/ REFLEX TO CULTURE (INFECTION SUSPECTED)
Bilirubin Urine: NEGATIVE
Glucose, UA: NEGATIVE mg/dL
Ketones, ur: NEGATIVE mg/dL
Nitrite: NEGATIVE
Protein, ur: 30 mg/dL — AB
Specific Gravity, Urine: 1.008 (ref 1.005–1.030)
WBC, UA: >50 WBC/hpf (ref 0–5)
pH: 6 (ref 5.0–8.0)

## 2023-03-02 LAB — COMPREHENSIVE METABOLIC PANEL
ALT: 9 U/L (ref 0–44)
AST: 14 U/L — ABNORMAL LOW (ref 15–41)
Albumin: 2 g/dL — ABNORMAL LOW (ref 3.5–5.0)
Alkaline Phosphatase: 54 U/L (ref 38–126)
Anion gap: 13 (ref 5–15)
BUN: 32 mg/dL — ABNORMAL HIGH (ref 8–23)
CO2: 24 mmol/L (ref 22–32)
Calcium: 7.8 mg/dL — ABNORMAL LOW (ref 8.9–10.3)
Chloride: 103 mmol/L (ref 98–111)
Creatinine, Ser: 1.59 mg/dL — ABNORMAL HIGH (ref 0.44–1.00)
GFR, Estimated: 33 mL/min — ABNORMAL LOW (ref >60)
Glucose, Bld: 85 mg/dL (ref 70–99)
Potassium: 3.4 mmol/L — ABNORMAL LOW (ref 3.5–5.1)
Sodium: 140 mmol/L (ref 135–145)
Total Bilirubin: 1 mg/dL (ref 0.3–1.2)
Total Protein: 5.8 g/dL — ABNORMAL LOW (ref 6.5–8.1)

## 2023-03-02 LAB — RESP PANEL BY RT-PCR (RSV, FLU A&B, COVID)  RVPGX2
Influenza A by PCR: NEGATIVE
Influenza B by PCR: NEGATIVE
Resp Syncytial Virus by PCR: NEGATIVE
SARS Coronavirus 2 by RT PCR: NEGATIVE

## 2023-03-02 LAB — CBC WITH DIFFERENTIAL/PLATELET
Abs Immature Granulocytes: 0 10*3/uL (ref 0.00–0.07)
Basophils Absolute: 0 10*3/uL (ref 0.0–0.1)
Basophils Relative: 0 %
Eosinophils Absolute: 0 10*3/uL (ref 0.0–0.5)
Eosinophils Relative: 0 %
HCT: 28.2 % — ABNORMAL LOW (ref 36.0–46.0)
Hemoglobin: 8.9 g/dL — ABNORMAL LOW (ref 12.0–15.0)
Lymphocytes Relative: 9 %
Lymphs Abs: 2.5 10*3/uL (ref 0.7–4.0)
MCH: 28.3 pg (ref 26.0–34.0)
MCHC: 31.6 g/dL (ref 30.0–36.0)
MCV: 89.5 fL (ref 80.0–100.0)
Monocytes Absolute: 1.4 10*3/uL — ABNORMAL HIGH (ref 0.1–1.0)
Monocytes Relative: 5 %
Neutro Abs: 23.7 10*3/uL — ABNORMAL HIGH (ref 1.7–7.7)
Neutrophils Relative %: 86 %
Platelets: 334 10*3/uL (ref 150–400)
RBC: 3.15 MIL/uL — ABNORMAL LOW (ref 3.87–5.11)
RDW: 15.5 % (ref 11.5–15.5)
WBC: 27.5 10*3/uL — ABNORMAL HIGH (ref 4.0–10.5)
nRBC: 0 % (ref 0.0–0.2)
nRBC: 0 /100{WBCs}

## 2023-03-02 LAB — CBC
HCT: 34 % — ABNORMAL LOW (ref 36.0–46.0)
Hemoglobin: 10.8 g/dL — ABNORMAL LOW (ref 12.0–15.0)
MCH: 28.3 pg (ref 26.0–34.0)
MCHC: 31.8 g/dL (ref 30.0–36.0)
MCV: 89.2 fL (ref 80.0–100.0)
Platelets: 406 10*3/uL — ABNORMAL HIGH (ref 150–400)
RBC: 3.81 MIL/uL — ABNORMAL LOW (ref 3.87–5.11)
RDW: 15.5 % (ref 11.5–15.5)
WBC: 32 10*3/uL — ABNORMAL HIGH (ref 4.0–10.5)
nRBC: 0 % (ref 0.0–0.2)

## 2023-03-02 LAB — GLUCOSE, CAPILLARY
Glucose-Capillary: 100 mg/dL — ABNORMAL HIGH (ref 70–99)
Glucose-Capillary: 106 mg/dL — ABNORMAL HIGH (ref 70–99)
Glucose-Capillary: 98 mg/dL (ref 70–99)

## 2023-03-02 LAB — MRSA NEXT GEN BY PCR, NASAL: MRSA by PCR Next Gen: NOT DETECTED

## 2023-03-02 LAB — PROCALCITONIN: Procalcitonin: 2.98 ng/mL

## 2023-03-02 LAB — CREATININE, SERUM
Creatinine, Ser: 1.68 mg/dL — ABNORMAL HIGH (ref 0.44–1.00)
GFR, Estimated: 31 mL/min — ABNORMAL LOW (ref >60)

## 2023-03-02 LAB — PROTIME-INR
INR: 1.3 — ABNORMAL HIGH (ref 0.8–1.2)
Prothrombin Time: 16.5 s — ABNORMAL HIGH (ref 11.4–15.2)

## 2023-03-02 LAB — CORTISOL: Cortisol, Plasma: 28 ug/dL

## 2023-03-02 LAB — I-STAT CG4 LACTIC ACID, ED
Lactic Acid, Venous: 0.7 mmol/L (ref 0.5–1.9)
Lactic Acid, Venous: 1.5 mmol/L (ref 0.5–1.9)

## 2023-03-02 LAB — APTT: aPTT: 34 s (ref 24–36)

## 2023-03-02 MED ORDER — CHLORHEXIDINE GLUCONATE CLOTH 2 % EX PADS
6.0000 | MEDICATED_PAD | Freq: Every day | CUTANEOUS | Status: DC
  Administered 2023-03-03: 6 via TOPICAL

## 2023-03-02 MED ORDER — SODIUM CHLORIDE 0.9 % IV SOLN
2.0000 g | Freq: Two times a day (BID) | INTRAVENOUS | Status: DC
  Administered 2023-03-02 – 2023-03-03 (×3): 2 g via INTRAVENOUS
  Filled 2023-03-02 (×6): qty 40

## 2023-03-02 MED ORDER — DOCUSATE SODIUM 100 MG PO CAPS
100.0000 mg | ORAL_CAPSULE | Freq: Two times a day (BID) | ORAL | Status: DC | PRN
Start: 2023-03-02 — End: 2023-03-08
  Administered 2023-03-03: 100 mg via ORAL
  Filled 2023-03-02: qty 1

## 2023-03-02 MED ORDER — CEFEPIME HCL 2 G IV SOLR
2.0000 g | Freq: Once | INTRAVENOUS | Status: AC
  Administered 2023-03-02: 2 g via INTRAVENOUS
  Filled 2023-03-02: qty 12.5

## 2023-03-02 MED ORDER — POTASSIUM CHLORIDE 10 MEQ/100ML IV SOLN
10.0000 meq | INTRAVENOUS | Status: AC
  Administered 2023-03-02 (×4): 10 meq via INTRAVENOUS
  Filled 2023-03-02 (×4): qty 100

## 2023-03-02 MED ORDER — MUPIROCIN 2 % EX OINT
1.0000 | TOPICAL_OINTMENT | Freq: Two times a day (BID) | CUTANEOUS | Status: DC
  Administered 2023-03-02 – 2023-03-03 (×2): 1 via NASAL
  Filled 2023-03-02: qty 22

## 2023-03-02 MED ORDER — INSULIN ASPART 100 UNIT/ML IJ SOLN
0.0000 [IU] | INTRAMUSCULAR | Status: DC
  Administered 2023-03-03 (×2): 2 [IU] via SUBCUTANEOUS
  Administered 2023-03-03: 3 [IU] via SUBCUTANEOUS
  Administered 2023-03-03 – 2023-03-04 (×2): 2 [IU] via SUBCUTANEOUS
  Administered 2023-03-05: 3 [IU] via SUBCUTANEOUS
  Administered 2023-03-05: 2 [IU] via SUBCUTANEOUS

## 2023-03-02 MED ORDER — SODIUM CHLORIDE 0.9 % IV SOLN
250.0000 mL | INTRAVENOUS | Status: DC
  Administered 2023-03-02: 250 mL via INTRAVENOUS

## 2023-03-02 MED ORDER — VANCOMYCIN HCL 1500 MG/300ML IV SOLN
1500.0000 mg | Freq: Once | INTRAVENOUS | Status: AC
  Administered 2023-03-02: 1500 mg via INTRAVENOUS
  Filled 2023-03-02: qty 300

## 2023-03-02 MED ORDER — POLYETHYLENE GLYCOL 3350 17 G PO PACK: 17.0000 g | PACK | Freq: Every day | ORAL | Status: DC | PRN

## 2023-03-02 MED ORDER — HEPARIN SODIUM (PORCINE) 5000 UNIT/ML IJ SOLN
5000.0000 [IU] | Freq: Three times a day (TID) | INTRAMUSCULAR | Status: DC
  Administered 2023-03-02 – 2023-03-08 (×18): 5000 [IU] via SUBCUTANEOUS
  Filled 2023-03-02 (×17): qty 1

## 2023-03-02 MED ORDER — VANCOMYCIN VARIABLE DOSE PER UNSTABLE RENAL FUNCTION (PHARMACIST DOSING): Status: DC

## 2023-03-02 MED ORDER — ACETAMINOPHEN 10 MG/ML IV SOLN
1000.0000 mg | Freq: Three times a day (TID) | INTRAVENOUS | Status: DC | PRN
  Filled 2023-03-02: qty 100

## 2023-03-02 MED ORDER — ONDANSETRON HCL 4 MG/2ML IJ SOLN: 4.0000 mg | Freq: Four times a day (QID) | INTRAMUSCULAR | Status: DC | PRN

## 2023-03-02 MED ORDER — VANCOMYCIN HCL IN DEXTROSE 1-5 GM/200ML-% IV SOLN: 1000.0000 mg | Freq: Once | INTRAVENOUS | Status: DC

## 2023-03-02 MED ORDER — LACTATED RINGERS IV BOLUS
500.0000 mL | Freq: Once | INTRAVENOUS | Status: AC
  Administered 2023-03-02: 500 mL via INTRAVENOUS

## 2023-03-02 MED ORDER — LACTATED RINGERS IV BOLUS
1000.0000 mL | Freq: Once | INTRAVENOUS | Status: AC
  Administered 2023-03-02: 1000 mL via INTRAVENOUS

## 2023-03-02 MED ORDER — NOREPINEPHRINE 4 MG/250ML-% IV SOLN
2.0000 ug/min | INTRAVENOUS | Status: DC
  Administered 2023-03-02 (×2): 2 ug/min via INTRAVENOUS
  Administered 2023-03-03: 6 ug/min via INTRAVENOUS
  Filled 2023-03-02 (×2): qty 250

## 2023-03-02 MED ORDER — LACTATED RINGERS IV BOLUS (SEPSIS)
1000.0000 mL | Freq: Once | INTRAVENOUS | Status: AC
  Administered 2023-03-02: 1000 mL via INTRAVENOUS

## 2023-03-02 MED ORDER — ACETAMINOPHEN 325 MG PO TABS
650.0000 mg | ORAL_TABLET | Freq: Once | ORAL | Status: AC
Start: 2023-03-02 — End: 2023-03-02
  Administered 2023-03-02: 650 mg via ORAL
  Filled 2023-03-02: qty 2

## 2023-03-02 MED ORDER — METRONIDAZOLE 500 MG/100ML IV SOLN
500.0000 mg | Freq: Once | INTRAVENOUS | Status: AC
  Administered 2023-03-02: 500 mg via INTRAVENOUS
  Filled 2023-03-02: qty 100

## 2023-03-02 NOTE — ED Triage Notes (Signed)
PT BIB EMS from SNF, [facility], for fever and AMS. Pt was d/c from hospital for pylo 2 weeks ago.

## 2023-03-02 NOTE — ED Notes (Signed)
IP provider at bedside.

## 2023-03-02 NOTE — ED Notes (Signed)
ED TO INPATIENT HANDOFF REPORT  ED Nurse Name and Phone #: Osvaldo Shipper RN [phone]  S Name/Age/Gender [name] 77 y.o. female Room/Bed: 025C/025C  Code Status   Code Status: Limited: Do not attempt resuscitation (DNR) -DNR-LIMITED -Do Not Intubate/DNI   Home/SNF/Other SNF A&OX0 Is this baseline? No   Triage Complete: Triage complete  Chief Complaint Septic shock (HCC) [A41.9, R65.21]  Triage Note PT BIB EMS from SNF, [facility], for fever and AMS. Pt was d/c from hospital for pylo 2 weeks ago.    Allergies Allergies  Allergen Reactions   Alendronate Other (See Comments)    Muscle weakness   Valsartan Nausea And Vomiting    Level of Care/Admitting Diagnosis ED Disposition     ED Disposition  Admit   Condition  --   Comment  Hospital Area: [facility] [[id number]]  Level of Care: ICU [6]  May admit patient to [facility] or [facility] if equivalent level of care is available:: No  Covid Evaluation: Asymptomatic - no recent exposure (last 10 days) testing not required  Diagnosis: Septic shock [facility]) [[id number]]  Admitting Physician: Cheri Fowler [[id number]]  Attending Physician: Cheri Fowler [[id number]]  Certification:: I certify this patient will need inpatient services for at least 2 midnights  Expected Medical Readiness: 03/06/2023          B Medical/Surgery History Past Medical History:  Diagnosis Date   Acute pain of left knee    Angina pectoris (HCC) 07/28/2016   With normal coronary arteriography   Carotid stenosis, right 11/18/2018   Cerebral infarction due to stenosis of right carotid artery (HCC) s/p tPA 11/15/2018   CHF (congestive heart failure) (HCC)    Chronic diastolic congestive heart failure (HCC) 07/28/2016   Chronic venous insufficiency 09/27/2021   COPD (chronic obstructive pulmonary disease) (HCC) 01/14/2018   Diabetes mellitus type II, uncontrolled 11/18/2018   Diabetes mellitus without complication (HCC)     Diabetic polyneuropathy associated with type 2 diabetes mellitus (HCC) 11/30/2015   Fall 11/21/2018   Family hx-stroke 11/18/2018   Hyperlipidemia    Hypertension    Hypertensive heart disease with heart failure (HCC) 07/28/2016   Living in nursing home    Lumbar disc narrowing    Onychogryposis of toenail 09/07/2020   Osteoporosis    Overgrown toenails 12/16/2020   Pustules determined by examination 04/11/2018   Spondylosis    lumbosacral   Stroke [facility])    Past Surgical History:  Procedure Laterality Date   ABDOMINAL HYSTERECTOMY     APPENDECTOMY     CARDIAC CATHETERIZATION     CHOLECYSTECTOMY     ENDARTERECTOMY Right 11/22/2018   Procedure: ENDARTERECTOMY CAROTID RIGHT;  Surgeon: Cephus Shelling, MD;  Location: [facility] OR;  Service: Vascular;  Laterality: Right;   HIP SURGERY  07/16/2019   Had to repair a lot of damage   KNEE ARTHROCENTESIS  11/22/2018       LOOP RECORDER INSERTION N/A 11/08/2022   Procedure: LOOP RECORDER INSERTION;  Surgeon: Regan Lemming, MD;  Location: [facility] INVASIVE CV LAB;  Service: Cardiovascular;  Laterality: N/A;   PATCH ANGIOPLASTY Right 11/22/2018   Procedure: Patch Angioplasty Right Carotid Artery using Xenosure Biologic Patch;  Surgeon: Cephus Shelling, MD;  Location: [facility] OR;  Service: Vascular;  Laterality: Right;     A IV Location/Drains/Wounds Patient Lines/Drains/Airways Status     Active Line/Drains/Airways     Name Placement date Placement time Site Days   Peripheral IV 03/02/23  20 G Anterior;Left Forearm 03/02/23  0937  Forearm  less than 1   Peripheral IV 03/02/23 20 G Anterior;Right Forearm 03/02/23  1039  Forearm  less than 1   Peripheral IV 03/02/23 20 G Anterior;Distal;Left Forearm 03/02/23  1145  Forearm  less than 1   Urethral Catheter C. Kaianna Dolezal RN Temperature probe 16 Fr. 03/02/23  1147  Temperature probe  less than 1   Pressure Injury 01/29/23 Buttocks Bilateral Stage 2 -  Partial thickness loss of dermis presenting as a shallow  open injury with a red, pink wound bed without slough. Stage 2 pressure injury on buttocks with moisture associated damage in the surrounding  01/29/23  0600  -- 32   Wound / Incision (Open or Dehisced) 01/29/23 Irritant Dermatitis (Moisture Associated Skin Damage) Vagina Bilateral Moisture associated skin damage with scattered skin tears to vaginal area, inner thighs, and buttocks. 01/29/23  0600  Vagina  32   Wound / Incision (Open or Dehisced) 01/29/23 Irritant Dermatitis (Moisture Associated Skin Damage) Vagina Bilateral Moisture associated skin damage scattered with scattered skin tears to the vaginal area, inner thighs, and buttocks. 01/29/23  0600  Vagina  32            Intake/Output Last 24 hours  Intake/Output Summary (Last 24 hours) at 03/02/2023 1609 Last data filed at 03/02/2023 1503 Gross per 24 hour  Intake 2950.48 ml  Output 1500 ml  Net 1450.48 ml    Labs/Imaging Results for orders placed or performed during the hospital encounter of 03/02/23 (from the past 48 hour(s))  Urinalysis, w/ Reflex to Culture (Infection Suspected) -Urine, Catheterized     Status: Abnormal   Collection Time: 03/02/23 10:15 AM  Result Value Ref Range   Specimen Source URINE, CATHETERIZED    Color, Urine YELLOW YELLOW   APPearance HAZY (A) CLEAR   Specific Gravity, Urine 1.008 1.005 - 1.030   pH 6.0 5.0 - 8.0   Glucose, UA NEGATIVE NEGATIVE mg/dL   Hgb urine dipstick SMALL (A) NEGATIVE   Bilirubin Urine NEGATIVE NEGATIVE   Ketones, ur NEGATIVE NEGATIVE mg/dL   Protein, ur 30 (A) NEGATIVE mg/dL   Nitrite NEGATIVE NEGATIVE   Leukocytes,Ua LARGE (A) NEGATIVE   RBC / HPF 0-5 0 - 5 RBC/hpf   WBC, UA >50 0 - 5 WBC/hpf    Comment:        Reflex urine culture not performed if WBC <=10, OR if Squamous epithelial cells >5. If Squamous epithelial cells >5 suggest recollection.    Bacteria, UA MANY (A) NONE SEEN   Squamous Epithelial / HPF 0-5 0 - 5 /HPF   Non Squamous Epithelial 0-5 (A) NONE  SEEN    Comment: Performed at [facility] Lab, [address] [place], [place] [address]  Resp panel by RT-PCR (RSV, Flu A&B, Covid) Peripheral     Status: None   Collection Time: 03/02/23 10:47 AM   Specimen: Peripheral; Nasal Swab  Result Value Ref Range   SARS Coronavirus 2 by RT PCR NEGATIVE NEGATIVE   Influenza A by PCR NEGATIVE NEGATIVE   Influenza B by PCR NEGATIVE NEGATIVE    Comment: (NOTE) The Xpert Xpress SARS-CoV-2/FLU/RSV plus assay is intended as an aid in the diagnosis of influenza from Nasopharyngeal swab specimens and should not be used as a sole basis for treatment. Nasal washings and aspirates are unacceptable for Xpert Xpress SARS-CoV-2/FLU/RSV testing.  Fact Sheet for Patients: BloggerCourse.com  Fact Sheet for Healthcare Providers: SeriousBroker.it  This test is  not yet approved or cleared by the [place] and has been authorized for detection and/or diagnosis of SARS-CoV-2 by FDA under an Emergency Use Authorization (EUA). This EUA will remain in effect (meaning this test can be used) for the duration of the COVID-19 declaration under Section 564(b)(1) of the Act, 21 U.S.C. section 360bbb-3(b)(1), unless the authorization is terminated or revoked.     Resp Syncytial Virus by PCR NEGATIVE NEGATIVE    Comment: (NOTE) Fact Sheet for Patients: BloggerCourse.com  Fact Sheet for Healthcare Providers: SeriousBroker.it  This test is not yet approved or cleared by the [place] FDA and has been authorized for detection and/or diagnosis of SARS-CoV-2 by FDA under an Emergency Use Authorization (EUA). This EUA will remain in effect (meaning this test can be used) for the duration of the COVID-19 declaration under Section 564(b)(1) of the Act, 21 U.S.C. section 360bbb-3(b)(1), unless the authorization is terminated or revoked.  Performed at [facility], [address], [place], [place] [address]   I-Stat Lactic Acid, ED     Status: None   Collection Time: 03/02/23 11:08 AM  Result Value Ref Range   Lactic Acid, Venous 0.7 0.5 - 1.9 mmol/L  CBC with Differential/Platelet     Status: Abnormal   Collection Time: 03/02/23 11:57 AM  Result Value Ref Range   WBC 27.5 (H) 4.0 - 10.5 K/uL   RBC 3.15 (L) 3.87 - 5.11 MIL/uL   Hemoglobin 8.9 (L) 12.0 - 15.0 g/dL   HCT 28.4 (L) 13.2 - 44.0 %   MCV 89.5 80.0 - 100.0 fL   MCH 28.3 26.0 - 34.0 pg   MCHC 31.6 30.0 - 36.0 g/dL   RDW 10.2 72.5 - 36.6 %   Platelets 334 150 - 400 K/uL   nRBC 0.0 0.0 - 0.2 %   Neutrophils Relative % 86 %   Neutro Abs 23.7 (H) 1.7 - 7.7 K/uL   Lymphocytes Relative 9 %   Lymphs Abs 2.5 0.7 - 4.0 K/uL   Monocytes Relative 5 %   Monocytes Absolute 1.4 (H) 0.1 - 1.0 K/uL   Eosinophils Relative 0 %   Eosinophils Absolute 0.0 0.0 - 0.5 K/uL   Basophils Relative 0 %   Basophils Absolute 0.0 0.0 - 0.1 K/uL   nRBC 0 0 /100 WBC   Abs Immature Granulocytes 0.00 0.00 - 0.07 K/uL    Comment: Performed at [facility] Lab, [address] [place], [place] [address]  Comprehensive metabolic panel     Status: Abnormal   Collection Time: 03/02/23 11:57 AM  Result Value Ref Range   Sodium 140 135 - 145 mmol/L   Potassium 3.4 (L) 3.5 - 5.1 mmol/L   Chloride 103 98 - 111 mmol/L   CO2 24 22 - 32 mmol/L   Glucose, Bld 85 70 - 99 mg/dL    Comment: Glucose reference range applies only to samples taken after fasting for at least 8 hours.   BUN 32 (H) 8 - 23 mg/dL   Creatinine, Ser 7.42 (H) 0.44 - 1.00 mg/dL   Calcium 7.8 (L) 8.9 - 10.3 mg/dL   Total Protein 5.8 (L) 6.5 - 8.1 g/dL   Albumin 2.0 (L) 3.5 - 5.0 g/dL   AST 14 (L) 15 - 41 U/L   ALT 9 0 - 44 U/L   Alkaline Phosphatase 54 38 - 126 U/L   Total Bilirubin 1.0 0.3 - 1.2 mg/dL   GFR, Estimated 33 (L) >60 mL/min  Comment: (NOTE) Calculated using the CKD-EPI Creatinine Equation (2021)    Anion gap 13 5 -  15    Comment: Performed at [facility], [address] [place], [place] [address]  Protime-INR     Status: Abnormal   Collection Time: 03/02/23 11:57 AM  Result Value Ref Range   Prothrombin Time 16.5 (H) 11.4 - 15.2 seconds   INR 1.3 (H) 0.8 - 1.2    Comment: (NOTE) INR goal varies based on device and disease states. Performed at [facility], [address] [place], [place] [address]   APTT     Status: None   Collection Time: 03/02/23 11:57 AM  Result Value Ref Range   aPTT 34 24 - 36 seconds    Comment: Performed at [facility], [address] [place], [place] [address]  I-Stat Lactic Acid, ED     Status: None   Collection Time: 03/02/23 12:12 PM  Result Value Ref Range   Lactic Acid, Venous 1.5 0.5 - 1.9 mmol/L   DG Chest Port 1 View  Result Date: 03/02/2023 CLINICAL DATA:  Sepsis EXAM: PORTABLE CHEST 1 VIEW COMPARISON:  01/28/2023 FINDINGS: Underinflation. Once again there are areas of interstitial thickening bilaterally. No pneumothorax, effusion or consolidation. Normal cardiopericardial silhouette with calcified aorta. Prominent calcifications along the mitral valve annulus. Loop recorder overlying the lower left hemithorax. Overlapping cardiac leads. Degenerative changes seen of the spine and shoulders. Osteopenia. Old right rib fractures. IMPRESSION: Underinflation with interstitial changes, chronic. Loop recorder. Electronically Signed   By: Karen Kays M.D.   On: 03/02/2023 12:06    Pending Labs Unresulted Labs (From admission, onward)     Start     Ordered   03/03/23 0500  CBC  Tomorrow morning,   R        03/02/23 1608   03/03/23 0500  Basic metabolic panel  Tomorrow morning,   R        03/02/23 1608   03/02/23 1608  Procalcitonin  Once,   R       References:    Procalcitonin Lower Respiratory Tract Infection AND Sepsis Procalcitonin Algorithm   03/02/23 1608   03/02/23 1608  Cortisol  ONCE - URGENT,   URGENT        03/02/23 1608    03/02/23 1607  CBC  (heparin)  Once,   R       Comments: Baseline for heparin therapy IF NOT ALREADY DRAWN.  Notify MD if PLT < 100 K.    03/02/23 1608   03/02/23 1607  Creatinine, serum  (heparin)  Once,   R       Comments: Baseline for heparin therapy IF NOT ALREADY DRAWN.    03/02/23 1608   03/02/23 1015  CBC with Differential  (Septic presentation on arrival (screening labs, nursing and treatment orders for obvious sepsis))  ONCE - STAT,   STAT        03/02/23 1016   03/02/23 1015  Blood Culture (routine x 2)  (Septic presentation on arrival (screening labs, nursing and treatment orders for obvious sepsis))  BLOOD CULTURE X 2,   STAT      03/02/23 1016   03/02/23 1015  Urine Culture  Once,   R        03/02/23 1015            Vitals/Pain Today's Vitals   03/02/23 1500 03/02/23 1510 03/02/23 1520 03/02/23 1525  BP: (!) 84/48 Marland Kitchen)  89/46 (!) 99/43 (!) 97/57  Pulse: 64 66 67 68  Resp: 16     Temp: 98.7 F (37.1 C) 98.6 F (37 C) 98.7 F (37.1 C) 98.7 F (37.1 C)  TempSrc: Bladder     SpO2: 100% 100% 100% 100%  Weight:      Height:        Isolation Precautions Airborne and Contact precautions  Medications Medications  0.9 %  sodium chloride infusion (250 mLs Intravenous New Bag/Given 03/02/23 1510)  norepinephrine (LEVOPHED) 4mg  in (0.016 mg/mL) premix infusion (2 mcg/min Intravenous New Bag/Given 03/02/23 1508)  meropenem (MERREM) 2 g in sodium chloride 0.9 % 100 mL IVPB (has no administration in time range)  docusate sodium (COLACE) capsule 100 mg (has no administration in time range)  polyethylene glycol (MIRALAX / GLYCOLAX) packet 17 g (has no administration in time range)  heparin injection 5,000 Units (has no administration in time range)  ondansetron (ZOFRAN) injection 4 mg (has no administration in time range)  ceFEPIme (MAXIPIME) 2 g in sodium chloride 0.9 % 100 mL IVPB (0 g Intravenous Stopped 03/02/23 1124)  metroNIDAZOLE (FLAGYL) IVPB 500 mg (0 mg  Intravenous Stopped 03/02/23 1158)  lactated ringers bolus 1,000 mL (0 mLs Intravenous Stopped 03/02/23 1125)  vancomycin (VANCOREADY) IVPB 1500 mg/300 mL (0 mg Intravenous Stopped 03/02/23 1424)  acetaminophen (TYLENOL) tablet 650 mg (650 mg Oral Given 03/02/23 1055)  lactated ringers bolus 1,000 mL (0 mLs Intravenous Stopped 03/02/23 1249)  lactated ringers bolus 500 mL (0 mLs Intravenous Stopped 03/02/23 1503)    Mobility non-ambulatory     Focused Assessments Cardiac Assessment Handoff: Code Sepsis with Hypotension  R Recommendations: See Admitting Provider Note  Report given to:   Additional Notes:

## 2023-03-02 NOTE — Progress Notes (Addendum)
Pharmacy Antibiotic Note  [name] is a 77 y.o. female for which pharmacy has been consulted for  meropenem  dosing for sepsis.  Patient with a history of HTN, T2DM, COPD, stroke. Patient presenting from EMS for decreased responsiveness.  Recent admission for pyelonephritis.  9/23 Ucx w/ E coli (Imipenem, nitrofurantoin, Pip/tazo - S) & Pseudomonas aeruginosa (Cipro and Gent R).  SCr 1.59 - up from 0.74 on 10/2 WBC 27.5; LA 1.5; T 98.7; HR 68; RR 16 COVID neg / flu neg   Plan: Meropenem 2g q12h Vancomycin 1500 mg once in the ED - repeat per level unless renal function stable Monitor WBC, fever, renal function, cultures De-escalate when able  Height: 5\' 3"  (160 cm) Weight: 84.7 kg (186 lb 11.7 oz) IBW/kg (Calculated) : 52.4  Temp (24hrs), Avg:100.2 F (37.9 C), Min:98.6 F (37 C), Max:103.6 F (39.8 C)  Recent Labs  Lab 03/02/23 1108 03/02/23 1157 03/02/23 1212  WBC  --  27.5*  --   CREATININE  --  1.59*  --   LATICACIDVEN 0.7  --  1.5    Estimated Creatinine Clearance: 31 mL/min (A) (by C-G formula based on SCr of 1.59 mg/dL (H)).    Allergies  Allergen Reactions   Alendronate Other (See Comments)    Muscle weakness   Valsartan Nausea And Vomiting   Microbiology results: Pending  Thank you for allowing pharmacy to be a part of this patient's care.  Delmar Landau, PharmD, BCPS 03/02/2023 3:40 PM ED Clinical Pharmacist -  [phone]

## 2023-03-02 NOTE — ED Provider Notes (Signed)
[place] EMERGENCY DEPARTMENT AT [facility] Provider Note   CSN: [id number] Arrival date & time: 03/02/23  1001     History  Chief Complaint  Patient presents with   Code Sepsis    [name] is a 77 y.o. female.  HPI Level 5 caveat secondary to patient is unable to give full history history is obtained from EMS 77 year old female history of hypertension, type 2 diabetes, COPD, stroke, recent admission and discharge for sepsis with pyelonephritis.  EMS reports that they were called out due to patient being less responsive than usual.  Reported to them that patient is usually talkative and interactive.  They found patient warm to touch and hypotensive with blood pressure 80/40.  Started IV fluids and oxygen due to low oxygen saturations.  Patient was transported from [facility] and rehab.     Home Medications Prior to Admission medications   Medication Sig Start Date End Date Taking? Authorizing Provider  acetaminophen (TYLENOL) 650 MG CR tablet Take 650-1,300 mg by mouth every 8 (eight) hours as needed for pain or fever. Do no exceed more than 3000mg  in 24 hours   Yes [provider]  aspirin 81 MG chewable tablet Chew 81 mg by mouth daily.   Yes [provider]  atorvastatin (LIPITOR) 80 MG tablet Take 80 mg by mouth at bedtime.    Yes [provider]  clopidogrel (PLAVIX) 75 MG tablet Take 75 mg by mouth daily.   Yes [provider]  docusate sodium (COLACE) 100 MG capsule Take 100 mg by mouth 2 (two) times daily.   Yes [provider]  ezetimibe (ZETIA) 10 MG tablet Take 10 mg by mouth daily.   Yes [provider]  gabapentin (NEURONTIN) 300 MG capsule Take 300 mg by mouth in the morning and at bedtime.   Yes [provider]  HUMALOG 100 UNIT/ML injection Before each meal 3 times a day, 140-199 - 2 units, 200-250 - 4 units, 251-299 - 6 units,  300-349 - 8 units,  350 or above 10 units. Patient  taking differently: Inject 2-12 Units into the skin 3 (three) times daily before meals. Before each meal 3 times a day, 201-250- give 2 units, 251-300- give 4 units, 301-350- give 6 units, 351-400- give 8 units, 401--450- give 10 units, 451-600- give 12 units 11/10/22  Yes Leroy Sea, MD  insulin glargine (LANTUS) 100 UNIT/ML injection Inject 15 Units into the skin 2 (two) times daily.   Yes [provider]  lansoprazole (PREVACID) 30 MG capsule Take 30 mg by mouth daily at 12 noon.   Yes [provider]  nitroGLYCERIN (NITROSTAT) 0.4 MG SL tablet Place 1 tablet (0.4 mg total) under the tongue every 5 (five) minutes as needed for chest pain. 02/10/21  Yes Baldo Daub, MD  polyethylene glycol powder (GLYCOLAX/MIRALAX) 17 GM/SCOOP powder Take 17 g by mouth daily.   Yes [provider]  rOPINIRole (REQUIP) 1 MG tablet Take 1 mg by mouth at bedtime.    Yes [provider]  solifenacin (VESICARE) 10 MG tablet Take 10 mg by mouth daily. 08/10/22  Yes [provider]  torsemide (DEMADEX) 100 MG tablet Take 0.5 tablets (50 mg total) by mouth daily. 02/07/23  Yes Regalado, Belkys A, MD  calcium carbonate (TUMS - DOSED IN MG ELEMENTAL CALCIUM) 500 MG chewable tablet Chew 1,000 mg by mouth in the morning, at noon, and at bedtime. Patient not taking: Reported on  03/02/2023    [provider]  ciprofloxacin (CIPRO) 500 MG tablet Take 500 mg by mouth daily. Patient not taking: Reported on 03/02/2023    [provider]      Allergies    Alendronate and Valsartan    Review of Systems   Review of Systems  Physical Exam Updated Vital Signs BP (!) 97/57   Pulse 68   Temp 98.7 F (37.1 C)   Resp 16   Ht 1.6 m (5\' 3" )   Wt 84.7 kg   SpO2 100%   BMI 33.08 kg/m  Physical Exam Vitals and nursing note reviewed.  HENT:     Head: Normocephalic.     Right Ear: External ear normal.     Left Ear: External ear normal.     Nose: Nose normal.      Mouth/Throat:     Mouth: Mucous membranes are dry.  Eyes:     Pupils: Pupils are equal, round, and reactive to light.  Cardiovascular:     Pulses: Normal pulses.  Pulmonary:     Effort: Pulmonary effort is normal.     Breath sounds: Normal breath sounds.  Abdominal:     Palpations: Abdomen is soft.  Musculoskeletal:        General: Normal range of motion.     Cervical back: Normal range of motion.  Skin:    General: Skin is warm and dry.     Capillary Refill: Capillary refill takes less than 2 seconds.     Comments: Perirectal skin redness without actual breakdown  Neurological:     Mental Status: [name] is alert.     Comments: Patient is oriented to person and place  Psychiatric:        Mood and Affect: Mood normal.     ED Results / Procedures / Treatments   Labs (all labs ordered are listed, but only abnormal results are displayed) Labs Reviewed  URINALYSIS, W/ REFLEX TO CULTURE (INFECTION SUSPECTED) - Abnormal; Notable for the following components:      Result Value   APPearance HAZY (*)    Hgb urine dipstick SMALL (*)    Protein, ur 30 (*)    Leukocytes,Ua LARGE (*)    Bacteria, UA MANY (*)    Non Squamous Epithelial 0-5 (*)    All other components within normal limits  CBC WITH DIFFERENTIAL/PLATELET - Abnormal; Notable for the following components:   WBC 27.5 (*)    RBC 3.15 (*)    Hemoglobin 8.9 (*)    HCT 28.2 (*)    Neutro Abs 23.7 (*)    Monocytes Absolute 1.4 (*)    All other components within normal limits  COMPREHENSIVE METABOLIC PANEL - Abnormal; Notable for the following components:   Potassium 3.4 (*)    BUN 32 (*)    Creatinine, Ser 1.59 (*)    Calcium 7.8 (*)    Total Protein 5.8 (*)    Albumin 2.0 (*)    AST 14 (*)    GFR, Estimated 33 (*)    All other components within normal limits  PROTIME-INR - Abnormal; Notable for the following components:   Prothrombin Time 16.5 (*)    INR 1.3 (*)    All other components within normal limits   RESP PANEL BY RT-PCR (RSV, FLU A&B, COVID)  RVPGX2  CULTURE, BLOOD (ROUTINE X 2)  CULTURE, BLOOD (ROUTINE X 2)  URINE CULTURE  APTT  CBC WITH DIFFERENTIAL/PLATELET  I-STAT CG4 LACTIC ACID, ED  I-STAT CG4 LACTIC ACID, ED    EKG EKG Interpretation Date/Time:  Friday March 02 2023 10:14:19 EDT Ventricular Rate:  90 PR Interval:  139 QRS Duration:  112 QT Interval:  408 QTC Calculation: 500 R Axis:   -44  Text Interpretation: Sinus rhythm Borderline IVCD with LAD Low voltage, precordial leads Borderline prolonged QT interval Confirmed by Margarita Grizzle [phone]) on 03/02/2023 10:20:18 AM  Radiology DG Chest Port 1 View  Result Date: 03/02/2023 CLINICAL DATA:  Sepsis EXAM: PORTABLE CHEST 1 VIEW COMPARISON:  01/28/2023 FINDINGS: Underinflation. Once again there are areas of interstitial thickening bilaterally. No pneumothorax, effusion or consolidation. Normal cardiopericardial silhouette with calcified aorta. Prominent calcifications along the mitral valve annulus. Loop recorder overlying the lower left hemithorax. Overlapping cardiac leads. Degenerative changes seen of the spine and shoulders. Osteopenia. Old right rib fractures. IMPRESSION: Underinflation with interstitial changes, chronic. Loop recorder. Electronically Signed   By: Karen Kays M.D.   On: 03/02/2023 12:06    Procedures .Critical Care  Performed by: Margarita Grizzle, MD Authorized by: Margarita Grizzle, MD   Critical care provider statement:    Critical care time (minutes):  75   Critical care was necessary to treat or prevent imminent or life-threatening deterioration of the following conditions:  Sepsis and shock   Critical care was time spent personally by me on the following activities:  Development of treatment plan with patient or surrogate, discussions with consultants, evaluation of patient's response to treatment, examination of patient, ordering and review of laboratory studies, ordering and review of  radiographic studies, ordering and performing treatments and interventions, pulse oximetry, re-evaluation of patient's condition and review of old charts     Medications Ordered in ED Medications  0.9 %  sodium chloride infusion (250 mLs Intravenous New Bag/Given 03/02/23 1510)  norepinephrine (LEVOPHED) 4mg  in (0.016 mg/mL) premix infusion (2 mcg/min Intravenous New Bag/Given 03/02/23 1508)  ceFEPIme (MAXIPIME) 2 g in sodium chloride 0.9 % 100 mL IVPB (0 g Intravenous Stopped 03/02/23 1124)  metroNIDAZOLE (FLAGYL) IVPB 500 mg (0 mg Intravenous Stopped 03/02/23 1158)  lactated ringers bolus 1,000 mL (0 mLs Intravenous Stopped 03/02/23 1125)  vancomycin (VANCOREADY) IVPB 1500 mg/300 mL (0 mg Intravenous Stopped 03/02/23 1424)  acetaminophen (TYLENOL) tablet 650 mg (650 mg Oral Given 03/02/23 1055)  lactated ringers bolus 1,000 mL (0 mLs Intravenous Stopped 03/02/23 1249)  lactated ringers bolus 500 mL (0 mLs Intravenous Stopped 03/02/23 1503)    ED Course/ Medical Decision Making/ A&P Clinical Course as of 03/02/23 1537  Fri Mar 02, 2023  1238 Chest x-Jesua Tamblyn reviewed interpreted no evidence of acute abnormality and radiologist interpretation concurs [DR]  1238 COVID, flu, and RSV panel obtained and reviewed and all negative [DR]  1239 First lactic acid 0.7 with repeat 1.5 [DR]  1239 Urinalysis reviewed interpreted significant for greater than 50 white blood cells with many bacteria and large leukocyte [DR]  1240 Patient recent neck receiving second liter of IV fluid blood pressure 84/44, lactic acid first and second within normal limits.  Patient is a DO NOT INTUBATE.  [name] will be given fluids judiciously to achieve increased blood pressure.  Awaiting remainder of labs to result [DR]  1315 CBC reviewed interpreted and for white blood cell count of 27,500 and hemoglobin of 8.9 [DR]  1315 White blood cell count is up significantly from first prior Hemoglobin is down from 9-8.9 [DR]  1315  Blood pressure is gradually improving with first prior 90/46 [DR]  1420 Complete metabolic panel  reviewed interpreted significant for mild hypokalemia at 3.4 [DR]  1420 Fattening increased to 1.59 with BUN of 32 this is more than doubled baseline [DR]    Clinical Course User Index [DR] Margarita Grizzle, MD                                 Medical Decision Making Amount and/or Complexity of Data Reviewed Labs: ordered. Radiology: ordered. ECG/medicine tests: ordered.  Risk OTC drugs. Prescription drug management.   77 year old female with recent admission for pyelonephritis presents today with fever and hypotension. Differential diagnosis includes but is not limited to sepsis from lung or other etiologies including urinary tract and pyelonephritis, other acute metabolic abnormalities Patient is being evaluated with labs, imaging, and urinalysis, COVID testing ongoing monitoring. [name] has noted to be a limited code with DNR and DO NOT INTUBATE but does want medical interventions Broad-spectrum antibiotics given [name] has received approximately 1 L of IV fluid prehospital and another liter is ordered here Patient has received almost 2.5 L of LR.  Her pressures currently 89/46.  However [name] is less responsive than when [name] arrived.  Discussed with husband.  He does not want ventilator.  However, he does appear to want blood pressure support Will start Levophed. Discussed care with critical care, Tessie Fass, NP they will see for admission       Final Clinical Impression(s) / ED Diagnoses Final diagnoses:  Hypotension, unspecified hypotension type  Urinary tract infection with hematuria, site unspecified    Rx / DC Orders ED Discharge Orders     None         Margarita Grizzle, MD 03/02/23 1537

## 2023-03-02 NOTE — ED Notes (Signed)
This RN attempted to call transfer of care report. IP RN will call back once assigned.

## 2023-03-02 NOTE — Sepsis Progress Note (Signed)
eLink is following this Code Sepsis. °

## 2023-03-02 NOTE — ED Notes (Signed)
X-ray at bedside

## 2023-03-02 NOTE — H&P (Signed)
NAME:  [name], MRN:  [id number], DOB:  [birth year], LOS: 0 ADMISSION DATE:  03/02/2023, CONSULTATION DATE:  03/02/23 REFERRING MD:  Rosalia Hammers, CHIEF COMPLAINT:  AMS   History of Present Illness:  77 yo F PMH DNR/I status, HFpEF, COPD, DM2, HTN, Dysphasia, recent ESBL ecoli bacteremia + pyelo with ESBL e coli and PsA (9/23-10/2/24) during which time she required ICU stay and pressor support + mero for abx coverage, who presented to ED 10/25 from [facility] w AMS.  Sounds like poor PO intake at facility, which was noted during prior admit as well. She was hypotensive in ED and received >2L IVF, before ultimately requiring low dose periph pressor initiation. She was started on vanc cefepime flagyl. Bcx and UA were obtained-- Urine looks infected. A foley was placed which yielded pus.   PCCM is consulted for admission   Pertinent  Medical History  DNR status ESBL bacteremia Pyelonephritis Polymicrobial UTI HFpEF COPD DM2 HTN  Dysphagia   Significant Hospital Events: Including procedures, antibiotic start and stop dates in addition to other pertinent events   10/25 low dose NE for hypotensive after failed fluid challenge. ICU admit for septic shock   Interim History / Subjective:  SBP 90s-110 on NE   Objective   Blood pressure 123/64, pulse 67, temperature 98.9 F (37.2 C), resp. rate 16, height 5\' 3"  (1.6 m), weight 84.7 kg, SpO2 100%.    FiO2 (%):  [100 %] 100 %   Intake/Output Summary (Last 24 hours) at 03/02/2023 1644 Last data filed at 03/02/2023 1628 Gross per 24 hour  Intake 2950.48 ml  Output 1850 ml  Net 1100.48 ml   Filed Weights   03/02/23 1014  Weight: 84.7 kg    Examination: General: Critically ill appearing obese elderly F  HENT: Dentures. Anicteric sclera  Lungs: CTAb  Cardiovascular: rrr Abdomen: soft round  Extremities: no acute joint deformity  Neuro: Awakens to noxious stim. Oriented to self. Following commands  GU: foley with pus in tubing,  yellow urine in collection bag + sediment   Resolved Hospital Problem list     Assessment & Plan:   DNR status GOC discussion -has paperwork that indicates DNR/I, avoid ICU. Discussed this w husband Lorella Nimrod given her last hospitalization during which she was in ICU and current pressor administration, who endorses DNR/I and indicates ICU admission is ok. We discussed this further and what has been clarified is peripheral pressors are ok. No CVC, Art line; if pressor req were to escalate to necessitate these interventions, discuss transition to comfort care  -also d/w nephew Brett Canales at pt request who indicates pt has previously expressed she would not want invasive measures and agrees w decision for no cvc art line, periph pressors only  P -DNR/I -Admit to ICU -periph pressors only, no CVC, no art line   Acute metabolic encephalopathy, sepsis  Septic shock +/- hypovolemic shock -- suspect recurrent UTI  Recent ESBL bacteremia Recent pyelonephritis with polymicrobial infection (ESBL e coli, PsA) P -mero given recent ESBL  infections + vanc given MRSA risk factors.  -check MRSA PCR  -follow bcx Ucx data  -NE for MAP > 65 -add'l 1L LR over 2hr  -if mentation perks up, would adv to clears  -will talk about abd imaging -- with her renal fxn would not be excited about doing contrast mediated study, but noncon would also be lower yield   AKI on CKD 3a  Hypokalemia  P -replace K -AM BMP -follow  UOP   HFpEF Hx HTN HLD P -holding home meds for now, resume as medically appropriate    DM2 w neuropathy  P -SSI -holding home gabapentin   Malnutrition Dysphagia  P -npo w her encephalopathy -looks like previously dysphagia diets were tried but she didn't like them   Best Practice (right click and "Reselect all SmartList Selections" daily)   Diet/type: NPO DVT prophylaxis: prophylactic heparin  GI prophylaxis: N/A Lines: N/A Foley:  Yes, and it is still needed Code Status:   DNR Last date of multidisciplinary goals of care discussion [10/25 spoke w husband Lorella Nimrod and then nephew steve per pt request ]  Labs   CBC: Recent Labs  Lab 03/02/23 1157  WBC 27.5*  NEUTROABS 23.7*  HGB 8.9*  HCT 28.2*  MCV 89.5  PLT 334    Basic Metabolic Panel: Recent Labs  Lab 03/02/23 1157  NA 140  K 3.4*  CL 103  CO2 24  GLUCOSE 85  BUN 32*  CREATININE 1.59*  CALCIUM 7.8*   GFR: Estimated Creatinine Clearance: 31 mL/min (A) (by C-G formula based on SCr of 1.59 mg/dL (H)). Recent Labs  Lab 03/02/23 1108 03/02/23 1157 03/02/23 1212  WBC  --  27.5*  --   LATICACIDVEN 0.7  --  1.5    Liver Function Tests: Recent Labs  Lab 03/02/23 1157  AST 14*  ALT 9  ALKPHOS 54  BILITOT 1.0  PROT 5.8*  ALBUMIN 2.0*   No results for input(s): "LIPASE", "AMYLASE" in the last 168 hours. No results for input(s): "AMMONIA" in the last 168 hours.  ABG    Component Value Date/Time   HCO3 29.9 (H) 01/29/2023 0601   TCO2 32 01/29/2023 0356   O2SAT 77.9 01/29/2023 0601     Coagulation Profile: Recent Labs  Lab 03/02/23 1157  INR 1.3*    Cardiac Enzymes: No results for input(s): "CKTOTAL", "CKMB", "CKMBINDEX", "TROPONINI" in the last 168 hours.  HbA1C: Hgb A1c MFr Bld  Date/Time Value Ref Range Status  11/08/2022 03:14 AM 8.2 (H) 4.8 - 5.6 % Final    Comment:    (NOTE) Pre diabetes:          5.7%-6.4%  Diabetes:              >6.4%  Glycemic control for   <7.0% adults with diabetes   11/16/2018 03:15 AM 10.4 (H) 4.8 - 5.6 % Final    Comment:    (NOTE)         Prediabetes: 5.7 - 6.4         Diabetes: >6.4         Glycemic control for adults with diabetes: <7.0     CBG: No results for input(s): "GLUCAP" in the last 168 hours.  Review of Systems:   Unreliable due to encephalopathy   Past Medical History:  She,  has a past medical history of Acute pain of left knee, Angina pectoris (HCC) (07/28/2016), Carotid stenosis, right (11/18/2018),  Cerebral infarction due to stenosis of right carotid artery (HCC) s/p tPA (11/15/2018), CHF (congestive heart failure) (HCC), Chronic diastolic congestive heart failure (HCC) (07/28/2016), Chronic venous insufficiency (09/27/2021), COPD (chronic obstructive pulmonary disease) (HCC) (01/14/2018), Diabetes mellitus type II, uncontrolled (11/18/2018), Diabetes mellitus without complication (HCC), Diabetic polyneuropathy associated with type 2 diabetes mellitus (HCC) (11/30/2015), Fall (11/21/2018), Family hx-stroke (11/18/2018), Hyperlipidemia, Hypertension, Hypertensive heart disease with heart failure (HCC) (07/28/2016), Living in nursing home, Lumbar disc narrowing, Onychogryposis of toenail (09/07/2020), Osteoporosis, Overgrown toenails (12/16/2020),  Pustules determined by examination (04/11/2018), Spondylosis, and Stroke (HCC).   Surgical History:   Past Surgical History:  Procedure Laterality Date   ABDOMINAL HYSTERECTOMY     APPENDECTOMY     CARDIAC CATHETERIZATION     CHOLECYSTECTOMY     ENDARTERECTOMY Right 11/22/2018   Procedure: ENDARTERECTOMY CAROTID RIGHT;  Surgeon: Cephus Shelling, MD;  Location: [facility] OR;  Service: Vascular;  Laterality: Right;   HIP SURGERY  07/16/2019   Had to repair a lot of damage   KNEE ARTHROCENTESIS  11/22/2018       LOOP RECORDER INSERTION N/A 11/08/2022   Procedure: LOOP RECORDER INSERTION;  Surgeon: Regan Lemming, MD;  Location: [facility] INVASIVE CV LAB;  Service: Cardiovascular;  Laterality: N/A;   PATCH ANGIOPLASTY Right 11/22/2018   Procedure: Patch Angioplasty Right Carotid Artery using Xenosure Biologic Patch;  Surgeon: Cephus Shelling, MD;  Location[facility] OR;  Service: Vascular;  Laterality: Right;     Social History:   reports that she has never smoked. She has never been exposed to tobacco smoke. She has never used smokeless tobacco. She reports that she does not currently use alcohol. She reports that she does not use drugs.   Family History:  Her family  history includes Alcohol abuse in her father; CAD in her sister and sister; Diabetes in her mother; Heart disease in her mother; Hypertension in her mother; Prostate cancer in her father; Stroke in her mother; Valvular heart disease in her sister.   Allergies Allergies  Allergen Reactions   Alendronate Other (See Comments)    Muscle weakness   Valsartan Nausea And Vomiting     Home Medications  Prior to Admission medications   Medication Sig Start Date End Date Taking? Authorizing Provider  acetaminophen (TYLENOL) 650 MG CR tablet Take 650-1,300 mg by mouth every 8 (eight) hours as needed for pain or fever. Do no exceed more than 3000mg  in 24 hours   Yes [provider]  aspirin 81 MG chewable tablet Chew 81 mg by mouth daily.   Yes [provider]  atorvastatin (LIPITOR) 80 MG tablet Take 80 mg by mouth at bedtime.    Yes [provider]  clopidogrel (PLAVIX) 75 MG tablet Take 75 mg by mouth daily.   Yes [provider]  docusate sodium (COLACE) 100 MG capsule Take 100 mg by mouth 2 (two) times daily.   Yes [provider]  ezetimibe (ZETIA) 10 MG tablet Take 10 mg by mouth daily.   Yes [provider]  gabapentin (NEURONTIN) 300 MG capsule Take 300 mg by mouth in the morning and at bedtime.   Yes [provider]  HUMALOG 100 UNIT/ML injection Before each meal 3 times a day, 140-199 - 2 units, 200-250 - 4 units, 251-299 - 6 units,  300-349 - 8 units,  350 or above 10 units. Patient taking differently: Inject 2-12 Units into the skin 3 (three) times daily before meals. Before each meal 3 times a day, 201-250- give 2 units, 251-300- give 4 units, 301-350- give 6 units, 351-400- give 8 units, 401--450- give 10 units, 451-600- give 12 units 11/10/22  Yes Leroy Sea, MD  insulin glargine (LANTUS) 100 UNIT/ML injection Inject 15 Units into the skin 2 (two) times daily.   Yes [provider]  lansoprazole (PREVACID) 30 MG  capsule Take 30 mg by mouth daily at 12 noon.   Yes [provider]  nitroGLYCERIN (NITROSTAT) 0.4 MG SL tablet Place 1 tablet (  0.4 mg total) under the tongue every 5 (five) minutes as needed for chest pain. 02/10/21  Yes Baldo Daub, MD  polyethylene glycol powder (GLYCOLAX/MIRALAX) 17 GM/SCOOP powder Take 17 g by mouth daily.   Yes [provider]  rOPINIRole (REQUIP) 1 MG tablet Take 1 mg by mouth at bedtime.    Yes [provider]  solifenacin (VESICARE) 10 MG tablet Take 10 mg by mouth daily. 08/10/22  Yes [provider]  torsemide (DEMADEX) 100 MG tablet Take 0.5 tablets (50 mg total) by mouth daily. 02/07/23  Yes Regalado, Belkys A, MD  calcium carbonate (TUMS - DOSED IN MG ELEMENTAL CALCIUM) 500 MG chewable tablet Chew 1,000 mg by mouth in the morning, at noon, and at bedtime. Patient not taking: Reported on 03/02/2023    [provider]  ciprofloxacin (CIPRO) 500 MG tablet Take 500 mg by mouth daily. Patient not taking: Reported on 03/02/2023    [provider]     Critical care time:      CRITICAL CARE Performed by: Lanier Clam   Total critical care time: 55 minutes  Critical care time was exclusive of separately billable procedures and treating other patients.  Critical care was necessary to treat or prevent imminent or life-threatening deterioration.  Critical care was time spent personally by me on the following activities: development of treatment plan with patient and/or surrogate as well as nursing, discussions with consultants, evaluation of patient's response to treatment, examination of patient, obtaining history from patient or surrogate, ordering and performing treatments and interventions, ordering and review of laboratory studies, ordering and review of radiographic studies, pulse oximetry and re-evaluation of patient's condition.  Tessie Fass MSN, AGACNP-BC [facility] Pulmonary/Critical Care  Medicine Amion for pager  03/02/2023, 4:44 PM

## 2023-03-02 NOTE — Progress Notes (Signed)
ED Pharmacy Antibiotic Sign Off An antibiotic consult was received from an ED provider for vancomycin and cefepime per pharmacy dosing for sepsis. A chart review was completed to assess appropriateness.   The following one time order(s) were placed:  Vancomycin 1500 mg IV x 1 Cefepime 2g IV x 1  Further antibiotic and/or antibiotic pharmacy consults should be ordered by the admitting provider if indicated.   Thank you for allowing pharmacy to be a part of this patient's care.   Daylene Posey, [facility]  Clinical Pharmacist 03/02/23 10:17 AM

## 2023-03-03 ENCOUNTER — Inpatient Hospital Stay (HOSPITAL_COMMUNITY): Payer: Medicare Other

## 2023-03-03 DIAGNOSIS — A419 Sepsis, unspecified organism: Secondary | ICD-10-CM | POA: Diagnosis not present

## 2023-03-03 DIAGNOSIS — R6521 Severe sepsis with septic shock: Secondary | ICD-10-CM | POA: Diagnosis not present

## 2023-03-03 LAB — BASIC METABOLIC PANEL
Anion gap: 14 (ref 5–15)
Anion gap: 15 (ref 5–15)
BUN: 34 mg/dL — ABNORMAL HIGH (ref 8–23)
BUN: 37 mg/dL — ABNORMAL HIGH (ref 8–23)
CO2: 22 mmol/L (ref 22–32)
CO2: 19 mmol/L — ABNORMAL LOW (ref 22–32)
Calcium: 7.8 mg/dL — ABNORMAL LOW (ref 8.9–10.3)
Calcium: 7.6 mg/dL — ABNORMAL LOW (ref 8.9–10.3)
Chloride: 104 mmol/L (ref 98–111)
Chloride: 103 mmol/L (ref 98–111)
Creatinine, Ser: 1.66 mg/dL — ABNORMAL HIGH (ref 0.44–1.00)
Creatinine, Ser: 1.32 mg/dL — ABNORMAL HIGH (ref 0.44–1.00)
GFR, Estimated: 32 mL/min — ABNORMAL LOW (ref >60)
GFR, Estimated: 42 mL/min — ABNORMAL LOW (ref >60)
Glucose, Bld: 158 mg/dL — ABNORMAL HIGH (ref 70–99)
Glucose, Bld: 143 mg/dL — ABNORMAL HIGH (ref 70–99)
Potassium: 3.5 mmol/L (ref 3.5–5.1)
Potassium: 3.5 mmol/L (ref 3.5–5.1)
Sodium: 140 mmol/L (ref 135–145)
Sodium: 137 mmol/L (ref 135–145)

## 2023-03-03 LAB — BLOOD CULTURE ID PANEL (REFLEXED) - BCID2

## 2023-03-03 LAB — CBC
HCT: 30 % — ABNORMAL LOW (ref 36.0–46.0)
Hemoglobin: 9.6 g/dL — ABNORMAL LOW (ref 12.0–15.0)
MCH: 28.2 pg (ref 26.0–34.0)
MCHC: 32 g/dL (ref 30.0–36.0)
MCV: 88.2 fL (ref 80.0–100.0)
Platelets: 366 10*3/uL (ref 150–400)
RBC: 3.4 MIL/uL — ABNORMAL LOW (ref 3.87–5.11)
RDW: 15.2 % (ref 11.5–15.5)
WBC: 43 10*3/uL — ABNORMAL HIGH (ref 4.0–10.5)
nRBC: 0 % (ref 0.0–0.2)

## 2023-03-03 LAB — GLUCOSE, CAPILLARY
Glucose-Capillary: 105 mg/dL — ABNORMAL HIGH (ref 70–99)
Glucose-Capillary: 123 mg/dL — ABNORMAL HIGH (ref 70–99)
Glucose-Capillary: 132 mg/dL — ABNORMAL HIGH (ref 70–99)
Glucose-Capillary: 137 mg/dL — ABNORMAL HIGH (ref 70–99)
Glucose-Capillary: 160 mg/dL — ABNORMAL HIGH (ref 70–99)
Glucose-Capillary: 77 mg/dL (ref 70–99)

## 2023-03-03 LAB — PHOSPHORUS: Phosphorus: 4.2 mg/dL (ref 2.5–4.6)

## 2023-03-03 LAB — MAGNESIUM: Magnesium: 1.7 mg/dL (ref 1.7–2.4)

## 2023-03-03 MED ORDER — EZETIMIBE 10 MG PO TABS
10.0000 mg | ORAL_TABLET | Freq: Every day | ORAL | Status: DC
  Administered 2023-03-03 – 2023-03-08 (×6): 10 mg via ORAL
  Filled 2023-03-03 (×6): qty 1

## 2023-03-03 MED ORDER — FESOTERODINE FUMARATE ER 4 MG PO TB24
4.0000 mg | ORAL_TABLET | Freq: Every day | ORAL | Status: DC
  Administered 2023-03-03 – 2023-03-07 (×5): 4 mg via ORAL
  Filled 2023-03-03 (×6): qty 1

## 2023-03-03 MED ORDER — SENNA 8.6 MG PO TABS
2.0000 | ORAL_TABLET | Freq: Two times a day (BID) | ORAL | Status: DC
Start: 2023-03-03 — End: 2023-03-08
  Administered 2023-03-03 – 2023-03-08 (×6): 17.2 mg via ORAL
  Filled 2023-03-03 (×9): qty 2

## 2023-03-03 MED ORDER — GERHARDT'S BUTT CREAM: TOPICAL_CREAM | Freq: Every day | CUTANEOUS | Status: DC | PRN

## 2023-03-03 MED ORDER — PANTOPRAZOLE SODIUM 40 MG PO TBEC
40.0000 mg | DELAYED_RELEASE_TABLET | Freq: Every day | ORAL | Status: DC
  Administered 2023-03-03 – 2023-03-08 (×6): 40 mg via ORAL
  Filled 2023-03-03 (×6): qty 1

## 2023-03-03 MED ORDER — GABAPENTIN 300 MG PO CAPS
300.0000 mg | ORAL_CAPSULE | Freq: Two times a day (BID) | ORAL | Status: DC
  Administered 2023-03-03 – 2023-03-08 (×10): 300 mg via ORAL
  Filled 2023-03-03 (×10): qty 1

## 2023-03-03 MED ORDER — SODIUM CHLORIDE 0.9 % IV SOLN
1.0000 g | Freq: Two times a day (BID) | INTRAVENOUS | Status: DC
  Administered 2023-03-03 – 2023-03-04 (×2): 1 g via INTRAVENOUS
  Filled 2023-03-03 (×2): qty 20

## 2023-03-03 MED ORDER — ROPINIROLE HCL 1 MG PO TABS
1.0000 mg | ORAL_TABLET | Freq: Every day | ORAL | Status: DC
  Administered 2023-03-03 – 2023-03-07 (×5): 1 mg via ORAL
  Filled 2023-03-03 (×6): qty 1

## 2023-03-03 MED ORDER — PHENTOLAMINE MESYLATE 5 MG IJ SOLR
5.0000 mg | Freq: Once | INTRAMUSCULAR | Status: AC
  Administered 2023-03-03: 5 mg via SUBCUTANEOUS
  Filled 2023-03-03 (×2): qty 5

## 2023-03-03 MED ORDER — CLOPIDOGREL BISULFATE 75 MG PO TABS
75.0000 mg | ORAL_TABLET | Freq: Every day | ORAL | Status: DC
  Administered 2023-03-03 – 2023-03-08 (×6): 75 mg via ORAL
  Filled 2023-03-03 (×6): qty 1

## 2023-03-03 MED ORDER — IOHEXOL 350 MG/ML SOLN
60.0000 mL | Freq: Once | INTRAVENOUS | Status: AC | PRN
  Administered 2023-03-03: 60 mL via INTRAVENOUS

## 2023-03-03 MED ORDER — NITROGLYCERIN 2 % TD OINT
1.0000 [in_us] | TOPICAL_OINTMENT | Freq: Three times a day (TID) | TRANSDERMAL | Status: AC
  Administered 2023-03-03 – 2023-03-04 (×6): 1 [in_us] via TOPICAL
  Filled 2023-03-03: qty 30

## 2023-03-03 MED ORDER — ASPIRIN 81 MG PO TBEC
81.0000 mg | DELAYED_RELEASE_TABLET | Freq: Every day | ORAL | Status: DC
  Administered 2023-03-03 – 2023-03-08 (×6): 81 mg via ORAL
  Filled 2023-03-03 (×6): qty 1

## 2023-03-03 MED ORDER — ATORVASTATIN CALCIUM 80 MG PO TABS: 80.0000 mg | ORAL_TABLET | Freq: Every day | ORAL | Status: DC

## 2023-03-03 MED ORDER — CHLORHEXIDINE GLUCONATE CLOTH 2 % EX PADS
6.0000 | MEDICATED_PAD | CUTANEOUS | Status: AC
  Administered 2023-03-04 – 2023-03-06 (×2): 6 via TOPICAL

## 2023-03-03 MED ORDER — ATORVASTATIN CALCIUM 80 MG PO TABS
80.0000 mg | ORAL_TABLET | Freq: Every day | ORAL | Status: DC
  Administered 2023-03-03 – 2023-03-07 (×5): 80 mg via ORAL
  Filled 2023-03-03 (×5): qty 1

## 2023-03-03 NOTE — Plan of Care (Signed)
  Problem: Skin Integrity: Goal: Risk for impaired skin integrity will decrease Outcome: Progressing   Problem: Safety: Goal: Ability to remain free from injury will improve Outcome: Progressing   Problem: Pain Management: Goal: General experience of comfort will improve Outcome: Progressing   Problem: Elimination: Goal: Will not experience complications related to bowel motility Outcome: Progressing   Problem: Elimination: Goal: Will not experience complications related to urinary retention Outcome: Progressing   Problem: Nutrition: Goal: Adequate nutrition will be maintained Outcome: Progressing   Problem: Activity: Goal: Risk for activity intolerance will decrease Outcome: Progressing

## 2023-03-03 NOTE — Progress Notes (Signed)
Patient is constipated.  This RN has disimpacted her twice today so far.  Bowel regimen has been initiated.  Will continue to monitor patient.

## 2023-03-03 NOTE — Progress Notes (Signed)
An USGPIV (ultrasound guided PIV) has been placed for short-term vasopressor infusion. A correctly placed ivWatch must be used when administering Vasopressors. Should this treatment be needed beyond 24 hours, central line access should be obtained.  It will be the responsibility of the bedside nurse to follow best practice to prevent extravasations.

## 2023-03-03 NOTE — Progress Notes (Signed)
Pharmacy Antibiotic Note  [name] is a 77 y.o. female for which pharmacy has been consulted for  meropenem  dosing for sepsis.  Patient with a history of HTN, T2DM, COPD, stroke. Patient presenting from EMS for decreased responsiveness. Recent admission for pyelonephritis. 9/23 Ucx w/ E coli (Imipenem, nitrofurantoin, Pip/tazo - S) & Pseudomonas aeruginosa (Cipro and Gent R).  10/26 BCID positive for 1/4 bottles growing ESBL E coli  SCr trending up to 1.66  WBC trending up to 43, Tmax 104.7 > now afebrile  Plan: Decrease Meropenem to 1g IV q12h Monitor daily CBC, temp, SCr, and for clinical signs of improvement  F/u LOT  Height: 5\' 3"  (160 cm) Weight: 84.2 kg (185 lb 10 oz) IBW/kg (Calculated) : 52.4  Temp (24hrs), Avg:100.7 F (38.2 C), Min:98 F (36.7 C), Max:104.7 F (40.4 C)  Recent Labs  Lab 03/02/23 1108 03/02/23 1157 03/02/23 1212 03/02/23 1728 03/03/23 0922  WBC  --  27.5*  --  32.0* 43.0*  CREATININE  --  1.59*  --  1.68* 1.66*  LATICACIDVEN 0.7  --  1.5  --   --     Estimated Creatinine Clearance: 29.6 mL/min (A) (by C-G formula based on SCr of 1.66 mg/dL (H)).    Allergies  Allergen Reactions   Alendronate Other (See Comments)    Muscle weakness   Valsartan Nausea And Vomiting   Antibiotics during admission: Cefepime x1 10/25  Metronidazole x1 10/25  Vancomycin x1 10/25  Meropenem 10/25 >>   Microbiology results: 10/25 MRSA PCR: not detected 10/25 Bld Cx x2: 1/4 GNR 10/25 BCID: E coli w/ CTX-M resistance detected 10/25 Ur Cx: >100K GNR  Thank you for allowing pharmacy to be a part of this patient's care.  Wilburn Cornelia, PharmD, BCPS Clinical Pharmacist 03/03/2023 1:05 PM   Please refer to [facility] for pharmacy phone number

## 2023-03-03 NOTE — H&P (Signed)
NAME:  JAYLA ROLD, MRN:  [id number], DOB:  03-04-1946, LOS: 1 ADMISSION DATE:  03/02/2023, CONSULTATION DATE:  03/02/23 REFERRING MD:  Rosalia Hammers, CHIEF COMPLAINT:  AMS   History of Present Illness:  77 yo F PMH DNR/I status, HFpEF, COPD, DM2, HTN, Dysphasia, recent ESBL ecoli bacteremia + pyelo with ESBL e coli and PsA (9/23-10/2/24) during which time she required ICU stay and pressor support + mero for abx coverage, who presented to ED 10/25 from [facility] w AMS.  Sounds like poor PO intake at facility, which was noted during prior admit as well. She was hypotensive in ED and received >2L IVF, before ultimately requiring low dose periph pressor initiation. She was started on vanc cefepime flagyl. Bcx and UA were obtained-- Urine looks infected. A foley was placed which yielded pus.   PCCM is consulted for admission   Pertinent  Medical History  DNR status ESBL bacteremia Pyelonephritis Polymicrobial UTI HFpEF COPD DM2 HTN  Dysphagia   Significant Hospital Events: Including procedures, antibiotic start and stop dates in addition to other pertinent events   10/25 low dose NE for hypotensive after failed fluid challenge. ICU admit for septic shock   Interim History / Subjective:  Patient remained afebrile Continue to require vasopressor support to maintain MAP goal 65, currently on 6 mics of Levophed Blood culture grew ESBL E. coli  Objective   Blood pressure 129/70, pulse 95, temperature 98.6 F (37 C), temperature source Oral, resp. rate 16, height 5\' 3"  (1.6 m), weight 84.7 kg, SpO2 100%.    FiO2 (%):  [100 %] 100 %   Intake/Output Summary (Last 24 hours) at 03/03/2023 0857 Last data filed at 03/03/2023 0746 Gross per 24 hour  Intake 5065.83 ml  Output 4450 ml  Net 615.83 ml   Filed Weights   03/02/23 1014  Weight: 84.7 kg    Examination: General: Acute on chronically ill-appearing female, lying on the bed HEENT: Wanda/AT, eyes anicteric.  moist mucus  membranes Neuro: Alert, awake following commands Chest: Coarse breath sounds, no wheezes or rhonchi Heart: Regular rate and rhythm, no murmurs or gallops Abdomen: Soft, nontender, nondistended, bowel sounds present Skin: No rash  Labs and images pending for today  Resolved Hospital Problem list     Assessment & Plan:  Acute septic encephalopathy, improving Severe sepsis with septic shock due to acute recurrent pyelonephritis and ESBL E. coli bacteremia, POA Patient mental status is better today Continue to require vasopressor support, currently on Levophed at 6 mics UA is consistent with urinary tract infection Urine culture is pending Blood culture is growing ESBL E. coli Of note patient was recently treated for ESBL E. coli bacteremia and UTI Stop vancomycin Continue meropenem Follow-up urine culture Will get CT abdomen pelvis to rule out renal abscess considering she has recurrent bacteremia and UTI within 3 weeks and was noted yesterday after placement of Foley catheter  AKI on CKD 3a  Hypokalemia  Patient is making good amount of urine Monitor intake and output Avoid nephrotoxic agent Repeat BMP is pending  Chronic HFpEF Monitor intake and output Patient looks hypovolemic GDMT is contraindicated in the setting of shock  DM2 w neuropathy  Blood sugars are better controlled Continue sliding scale insulin Maintain CBG between 140-180   DNR status GOC discussion -has paperwork that indicates DNR/I, avoid ICU. Discussed this w husband Lorella Nimrod given her last hospitalization during which she was in ICU and current pressor administration, who endorses DNR/I and indicates ICU admission is  ok. We discussed this further and what has been clarified is peripheral pressors are ok. No CVC, Art line; if pressor req were to escalate to necessitate these interventions, discuss transition to comfort care  -also d/w nephew Brett Canales at pt request who indicates pt has previously expressed  she would not want invasive measures and agrees w decision for no cvc art line, periph pressors only  -DNR/I -Admit to ICU -periph pressors only, no CVC, no art line   Best Practice (right click and "Reselect all SmartList Selections" daily)   Diet/type: Regular consistency DVT prophylaxis: prophylactic heparin  GI prophylaxis: N/A Lines: N/A Foley:  Yes, and it is still needed Code Status:  DNR Last date of multidisciplinary goals of care discussion [10/25 spoke w husband Lorella Nimrod and then nephew steve per pt request ]  Labs   CBC: Recent Labs  Lab 03/02/23 1157 03/02/23 1728  WBC 27.5* 32.0*  NEUTROABS 23.7*  --   HGB 8.9* 10.8*  HCT 28.2* 34.0*  MCV 89.5 89.2  PLT 334 406*    Basic Metabolic Panel: Recent Labs  Lab 03/02/23 1157 03/02/23 1728  NA 140  --   K 3.4*  --   CL 103  --   CO2 24  --   GLUCOSE 85  --   BUN 32*  --   CREATININE 1.59* 1.68*  CALCIUM 7.8*  --    GFR: Estimated Creatinine Clearance: 29.4 mL/min (A) (by C-G formula based on SCr of 1.68 mg/dL (H)). Recent Labs  Lab 03/02/23 1108 03/02/23 1157 03/02/23 1212 03/02/23 1728  PROCALCITON  --   --   --  2.98  WBC  --  27.5*  --  32.0*  LATICACIDVEN 0.7  --  1.5  --     Liver Function Tests: Recent Labs  Lab 03/02/23 1157  AST 14*  ALT 9  ALKPHOS 54  BILITOT 1.0  PROT 5.8*  ALBUMIN 2.0*   No results for input(s): "LIPASE", "AMYLASE" in the last 168 hours. No results for input(s): "AMMONIA" in the last 168 hours.  ABG    Component Value Date/Time   HCO3 29.9 (H) 01/29/2023 0601   TCO2 32 01/29/2023 0356   O2SAT 77.9 01/29/2023 0601     Coagulation Profile: Recent Labs  Lab 03/02/23 1157  INR 1.3*    Cardiac Enzymes: No results for input(s): "CKTOTAL", "CKMB", "CKMBINDEX", "TROPONINI" in the last 168 hours.  HbA1C: Hgb A1c MFr Bld  Date/Time Value Ref Range Status  11/08/2022 03:14 AM 8.2 (H) 4.8 - 5.6 % Final    Comment:    (NOTE) Pre diabetes:           5.7%-6.4%  Diabetes:              >6.4%  Glycemic control for   <7.0% adults with diabetes   11/16/2018 03:15 AM 10.4 (H) 4.8 - 5.6 % Final    Comment:    (NOTE)         Prediabetes: 5.7 - 6.4         Diabetes: >6.4         Glycemic control for adults with diabetes: <7.0     CBG: Recent Labs  Lab 03/02/23 1730 03/02/23 1944 03/02/23 2312 03/03/23 0325 03/03/23 0756  GLUCAP 100* 106* 98 137* 160*    The patient is critically ill due to septic shock in the setting of acute UTI and bacteremia, requiring titration of pressors.  Critical care was necessary to treat or  prevent imminent or life-threatening deterioration.  Critical care was time spent personally by me on the following activities: development of treatment plan with patient and/or surrogate as well as nursing, discussions with consultants, evaluation of patient's response to treatment, examination of patient, obtaining history from patient or surrogate, ordering and performing treatments and interventions, ordering and review of laboratory studies, ordering and review of radiographic studies, pulse oximetry, re-evaluation of patient's condition and participation in multidisciplinary rounds.   During this encounter critical care time was devoted to patient care services described in this note for 38 minutes.     Cheri Fowler, MD [facility] See Amion for pager If no response to pager, please call [phone] until 7pm After 7pm, Please call E-link [phone]

## 2023-03-03 NOTE — Progress Notes (Signed)
PHARMACY - PHYSICIAN COMMUNICATION CRITICAL VALUE ALERT - BLOOD CULTURE IDENTIFICATION (BCID)  [name] is an 77 y.o. female who presented to [facility] on 03/02/2023 with a chief complaint of fever and AMS.  Assessment:  Started on broad-spectrum ABX for sepsis presumed d/t pyelo; blood cx is growing ESBL E.coli in 1 of 4 bottles.  Current antibiotics: meropenem and vancomycin  Changes to prescribed antibiotics recommended:  Patient is on recommended antibiotics - No changes needed, could consider d/c'ing vanc if other source of infection ruled out.  Results for orders placed or performed during the hospital encounter of 03/02/23  Blood Culture ID Panel (Reflexed) (Collected: 03/02/2023 10:47 AM)  Result Value Ref Range   Enterococcus faecalis NOT DETECTED NOT DETECTED   Enterococcus Faecium NOT DETECTED NOT DETECTED   Listeria monocytogenes NOT DETECTED NOT DETECTED   Staphylococcus species NOT DETECTED NOT DETECTED   Staphylococcus aureus (BCID) NOT DETECTED NOT DETECTED   Staphylococcus epidermidis NOT DETECTED NOT DETECTED   Staphylococcus lugdunensis NOT DETECTED NOT DETECTED   Streptococcus species NOT DETECTED NOT DETECTED   Streptococcus agalactiae NOT DETECTED NOT DETECTED   Streptococcus pneumoniae NOT DETECTED NOT DETECTED   Streptococcus pyogenes NOT DETECTED NOT DETECTED   A.calcoaceticus-baumannii NOT DETECTED NOT DETECTED   Bacteroides fragilis NOT DETECTED NOT DETECTED   Enterobacterales DETECTED (A) NOT DETECTED   Enterobacter cloacae complex NOT DETECTED NOT DETECTED   Escherichia coli DETECTED (A) NOT DETECTED   Klebsiella aerogenes NOT DETECTED NOT DETECTED   Klebsiella oxytoca NOT DETECTED NOT DETECTED   Klebsiella pneumoniae NOT DETECTED NOT DETECTED   Proteus species NOT DETECTED NOT DETECTED   Salmonella species NOT DETECTED NOT DETECTED   Serratia marcescens NOT DETECTED NOT DETECTED   Haemophilus influenzae NOT DETECTED NOT DETECTED   Neisseria  meningitidis NOT DETECTED NOT DETECTED   Pseudomonas aeruginosa NOT DETECTED NOT DETECTED   Stenotrophomonas maltophilia NOT DETECTED NOT DETECTED   Candida albicans NOT DETECTED NOT DETECTED   Candida auris NOT DETECTED NOT DETECTED   Candida glabrata NOT DETECTED NOT DETECTED   Candida krusei NOT DETECTED NOT DETECTED   Candida parapsilosis NOT DETECTED NOT DETECTED   Candida tropicalis NOT DETECTED NOT DETECTED   Cryptococcus neoformans/gattii NOT DETECTED NOT DETECTED   CTX-M ESBL DETECTED (A) NOT DETECTED   Carbapenem resistance IMP NOT DETECTED NOT DETECTED   Carbapenem resistance KPC NOT DETECTED NOT DETECTED   Carbapenem resistance NDM NOT DETECTED NOT DETECTED   Carbapenem resist OXA 48 LIKE NOT DETECTED NOT DETECTED   Carbapenem resistance VIM NOT DETECTED NOT DETECTED    Vernard Gambles, PharmD, BCPS  03/03/2023  7:12 AM

## 2023-03-04 DIAGNOSIS — G9340 Encephalopathy, unspecified: Secondary | ICD-10-CM | POA: Diagnosis not present

## 2023-03-04 DIAGNOSIS — Z1612 Extended spectrum beta lactamase (ESBL) resistance: Secondary | ICD-10-CM

## 2023-03-04 DIAGNOSIS — A498 Other bacterial infections of unspecified site: Secondary | ICD-10-CM | POA: Diagnosis not present

## 2023-03-04 DIAGNOSIS — A419 Sepsis, unspecified organism: Secondary | ICD-10-CM | POA: Diagnosis not present

## 2023-03-04 DIAGNOSIS — N390 Urinary tract infection, site not specified: Secondary | ICD-10-CM

## 2023-03-04 DIAGNOSIS — R319 Hematuria, unspecified: Secondary | ICD-10-CM

## 2023-03-04 LAB — URINE CULTURE: Culture: >=100000 — AB

## 2023-03-04 LAB — CBC
HCT: 24.8 % — ABNORMAL LOW (ref 36.0–46.0)
Hemoglobin: 8.1 g/dL — ABNORMAL LOW (ref 12.0–15.0)
MCH: 28.3 pg (ref 26.0–34.0)
MCHC: 32.7 g/dL (ref 30.0–36.0)
MCV: 86.7 fL (ref 80.0–100.0)
Platelets: 286 10*3/uL (ref 150–400)
RBC: 2.86 MIL/uL — ABNORMAL LOW (ref 3.87–5.11)
RDW: 15.2 % (ref 11.5–15.5)
WBC: 21.7 10*3/uL — ABNORMAL HIGH (ref 4.0–10.5)
nRBC: 0 % (ref 0.0–0.2)

## 2023-03-04 LAB — MAGNESIUM: Magnesium: 1.8 mg/dL (ref 1.7–2.4)

## 2023-03-04 LAB — PHOSPHORUS: Phosphorus: 3.3 mg/dL (ref 2.5–4.6)

## 2023-03-04 LAB — GLUCOSE, CAPILLARY
Glucose-Capillary: 85 mg/dL (ref 70–99)
Glucose-Capillary: 93 mg/dL (ref 70–99)
Glucose-Capillary: 104 mg/dL — ABNORMAL HIGH (ref 70–99)
Glucose-Capillary: 128 mg/dL — ABNORMAL HIGH (ref 70–99)
Glucose-Capillary: 129 mg/dL — ABNORMAL HIGH (ref 70–99)

## 2023-03-04 MED ORDER — POTASSIUM CHLORIDE CRYS ER 20 MEQ PO TBCR
40.0000 meq | EXTENDED_RELEASE_TABLET | Freq: Once | ORAL | Status: AC
  Administered 2023-03-04: 40 meq via ORAL
  Filled 2023-03-04: qty 2

## 2023-03-04 MED ORDER — ACETAMINOPHEN 325 MG PO TABS
650.0000 mg | ORAL_TABLET | Freq: Four times a day (QID) | ORAL | Status: DC | PRN
  Administered 2023-03-04: 650 mg via ORAL
  Filled 2023-03-04 (×2): qty 2

## 2023-03-04 MED ORDER — SODIUM CHLORIDE 0.9 % IV SOLN
2.0000 g | Freq: Two times a day (BID) | INTRAVENOUS | Status: DC
  Administered 2023-03-04 – 2023-03-06 (×3): 2 g via INTRAVENOUS
  Filled 2023-03-04 (×4): qty 40

## 2023-03-04 MED ORDER — SODIUM CHLORIDE 0.9 % IV SOLN
1.0000 g | Freq: Once | INTRAVENOUS | Status: AC
  Administered 2023-03-04: 1 g via INTRAVENOUS
  Filled 2023-03-04: qty 20

## 2023-03-04 MED ORDER — POLYETHYLENE GLYCOL 3350 17 G PO PACK
17.0000 g | PACK | Freq: Two times a day (BID) | ORAL | Status: DC
  Administered 2023-03-06 – 2023-03-08 (×4): 17 g via ORAL
  Filled 2023-03-04 (×6): qty 1

## 2023-03-04 MED ORDER — GERHARDT'S BUTT CREAM
TOPICAL_CREAM | Freq: Four times a day (QID) | CUTANEOUS | Status: DC
  Filled 2023-03-04: qty 1

## 2023-03-04 MED ORDER — BISACODYL 10 MG RE SUPP: 10.0000 mg | Freq: Every day | RECTAL | Status: DC | PRN

## 2023-03-04 MED ORDER — MIDODRINE HCL 5 MG PO TABS
10.0000 mg | ORAL_TABLET | Freq: Three times a day (TID) | ORAL | Status: DC
  Administered 2023-03-04 – 2023-03-08 (×13): 10 mg via ORAL
  Filled 2023-03-04 (×12): qty 2

## 2023-03-04 MED ORDER — SODIUM CHLORIDE 0.9 % IV BOLUS
1000.0000 mL | Freq: Once | INTRAVENOUS | Status: AC
  Administered 2023-03-04: 1000 mL via INTRAVENOUS

## 2023-03-04 NOTE — Progress Notes (Signed)
Pharmacy Antibiotic Note  [name] is a 77 y.o. female for which pharmacy has been consulted for  meropenem  dosing for sepsis.  Patient with a history of HTN, T2DM, COPD, stroke. Patient presenting from EMS for decreased responsiveness. Recent admission for pyelonephritis. 9/23 Ucx w/ E coli (Imipenem, nitrofurantoin, Pip/tazo - S) & Pseudomonas aeruginosa (Cipro and Gent R).  10/26 BCID positive for 1/4 bottles growing ESBL E coli  SCr trending down to 1.32  WBC trending down to 21.7, Tmax 101.5  Plan: Increase Meropenem to 2g IV q12h Monitor daily CBC, temp, SCr, and for clinical signs of improvement  F/u LOT  Height: 5\' 3"  (160 cm) Weight: 84.4 kg (186 lb 1.1 oz) IBW/kg (Calculated) : 52.4  Temp (24hrs), Avg:100.4 F (38 C), Min:98 F (36.7 C), Max:101.5 F (38.6 C)  Recent Labs  Lab 03/02/23 1108 03/02/23 1157 03/02/23 1212 03/02/23 1728 03/03/23 0922 03/03/23 2134 03/04/23 0340  WBC  --  27.5*  --  32.0* 43.0*  --  21.7*  CREATININE  --  1.59*  --  1.68* 1.66* 1.32*  --   LATICACIDVEN 0.7  --  1.5  --   --   --   --     Estimated Creatinine Clearance: 37.3 mL/min (A) (by C-G formula based on SCr of 1.32 mg/dL (H)).    Allergies  Allergen Reactions   Alendronate Other (See Comments)    Muscle weakness   Valsartan Nausea And Vomiting   Antibiotics during admission: Cefepime x1 10/25  Metronidazole x1 10/25  Vancomycin x1 10/25  Meropenem 10/25 >>   Microbiology results: 10/25 MRSA PCR: not detected 10/25 Bld Cx x2: 2/4 E coli 10/25 BCID: E coli w/ CTX-M resistance detected 10/25 Ur Cx: >100K ESBL E coli  Thank you for allowing pharmacy to be a part of this patient's care.  Wilburn Cornelia, PharmD, BCPS Clinical Pharmacist 03/04/2023 12:17 PM   Please refer to [facility] for pharmacy phone number

## 2023-03-04 NOTE — Plan of Care (Signed)
  Problem: Coping: Goal: Ability to adjust to condition or change in health will improve Outcome: Progressing   Problem: Fluid Volume: Goal: Ability to maintain a balanced intake and output will improve Outcome: Progressing   Problem: Metabolic: Goal: Ability to maintain appropriate glucose levels will improve Outcome: Progressing   Problem: Skin Integrity: Goal: Risk for impaired skin integrity will decrease Outcome: Progressing   Problem: Health Behavior/Discharge Planning: Goal: Ability to manage health-related needs will improve Outcome: Progressing   Problem: Clinical Measurements: Goal: Respiratory complications will improve Outcome: Progressing Goal: Cardiovascular complication will be avoided Outcome: Progressing   Problem: Nutritional: Goal: Maintenance of adequate nutrition will improve Outcome: Not Progressing Goal: Progress toward achieving an optimal weight will improve Outcome: Not Progressing

## 2023-03-04 NOTE — Progress Notes (Signed)
[facility] PROGRESS NOTE   [name] OZH:[id number] DOB: 03-22-1946 DOA: 03/02/2023  PCP: Paulina Fusi, MD  Brief History: 77 yo F PMH DNR/I status, HFpEF, COPD, DM2, HTN, Dysphasia, recent ESBL ecoli bacteremia + pyelo with ESBL e coli and PsA (9/23-10/2/24) during which time she required ICU stay and pressor support + mero for abx coverage, who presented to ED 10/25 from [facility] w AMS.  She was noted to be hypotensive.  Received IV fluids and then started on pressors.  She was started on broad-spectrum antibiotics.  Admitted to the ICU.  Consultants: Critical care medicine.  Procedures: Foley catheter placement in the ED    Subjective/Interval History: Patient not very communicative sounds like could be her baseline.  However she is able to shake her head yes and no.  She denies any pain.  No shortness of breath.    Assessment/Plan:  Severe sepsis with septic shock secondary to recurrent pyelonephritis and ESBL E. coli bacteremia Continues to remain febrile. WBC was significantly high and seems to be improving. CT of the abdomen pelvis showed urinary bladder wall thickening with mild bilateral hydroureteronephrosis thought to be secondary to cystitis.  Other findings include significant constipation with fecal retention. Blood cultures with ESBL E. coli.  UA also consistent with infection. She is currently on meropenem.  Await improvement in fever. Blood pressure remains soft.  She will be given additional fluid bolus today.  She is currently not on any pressors.  Was on Levophed until yesterday morning. Continue to monitor for now in the progressive unit.  Will see if there is any response to fluid bolus.  May need to consider midodrine. Of note she was admitted for similar issue last month and was discharged on 10/2.  At that time she received a 10-day course of antibiotics.  May need a longer course this time around.  Acute toxic encephalopathy Seems to  be improving.  Baseline mentation is not entirely clear.  Acute kidney injury on chronic kidney disease stage IIIa She had a creatinine of 0.74 on October 2.  Came in with creatinine of 1.59 which increased to 1.66 and then noted to be improving.  Monitor urine output.  Recheck labs tomorrow.  Avoid nephrotoxic agents.  Constipation/fecal impaction She was disimpacted in the ICU.  Will place her on aggressive bowel regimen.  TSH was normal in September.  Chronic diastolic CHF Echocardiogram from July 2024 showed grade 2 diastolic dysfunction with normal systolic function.  If at all she is hypovolemic.  Monitor volume status closely. Noted to be on aspirin Plavix ezetimibe statin.  Diabetes mellitus type 2 with peripheral neuropathy SSI.  CBGs.  Goals of care ICU team discussed with patient's spouse.  He confirmed DNR and DNI status.  Okay with ICU.  Peripheral pressors were okay.  No central line or art line.  Obesity Estimated body mass index is 32.96 kg/m as calculated from the following:   Height as of this encounter: 5\' 3"  (1.6 m).   Weight as of this encounter: 84.4 kg.  DVT Prophylaxis: Subcutaneous heparin Code Status: DNR and DNI Family Communication: No family at bedside Disposition Plan: SNF when medically stable  Status is: Inpatient Remains inpatient appropriate because: IV antibiotics      Medications: Scheduled:  aspirin EC  81 mg Oral Daily   atorvastatin  80 mg Oral QHS   Chlorhexidine Gluconate Cloth  6 each Topical Daily   clopidogrel  75 mg Oral Daily  ezetimibe  10 mg Oral Daily   fesoterodine  4 mg Oral Daily   gabapentin  300 mg Oral BID   Gerhardt's butt cream   Topical QID   heparin  5,000 Units Subcutaneous Q8H   insulin aspart  0-15 Units Subcutaneous Q4H   nitroGLYCERIN  1 inch Topical Q8H   pantoprazole  40 mg Oral Q1200   rOPINIRole  1 mg Oral QHS   senna  2 tablet Oral BID   Continuous:  meropenem (MERREM) IV 1 g (03/03/23 2108)    OZH:YQMVHQIONGEXB, docusate sodium, Gerhardt's butt cream, ondansetron (ZOFRAN) IV, polyethylene glycol  Antibiotics: Anti-infectives (From admission, onward)    Start     Dose/Rate Route Frequency Ordered Stop   03/03/23 2200  meropenem (MERREM) 1 g in sodium chloride 0.9 % 100 mL IVPB        1 g 200 mL/hr over 30 Minutes Intravenous Every 12 hours 03/03/23 1307     03/02/23 2121  vancomycin variable dose per unstable renal function (pharmacist dosing)  Status:  Discontinued         Does not apply See admin instructions 03/02/23 2121 03/03/23 0850   03/02/23 1545  meropenem (MERREM) 2 g in sodium chloride 0.9 % 100 mL IVPB  Status:  Discontinued        2 g 280 mL/hr over 30 Minutes Intravenous Every 12 hours 03/02/23 1540 03/03/23 1307   03/02/23 1030  ceFEPIme (MAXIPIME) 2 g in sodium chloride 0.9 % 100 mL IVPB        2 g 200 mL/hr over 30 Minutes Intravenous  Once 03/02/23 1016 03/02/23 1124   03/02/23 1030  metroNIDAZOLE (FLAGYL) IVPB 500 mg        500 mg 100 mL/hr over 60 Minutes Intravenous  Once 03/02/23 1016 03/02/23 1158   03/02/23 1030  vancomycin (VANCOCIN) IVPB 1000 mg/200 mL premix  Status:  Discontinued        1,000 mg 200 mL/hr over 60 Minutes Intravenous  Once 03/02/23 1016 03/02/23 1017   03/02/23 1030  vancomycin (VANCOREADY) IVPB 1500 mg/300 mL        1,500 mg 150 mL/hr over 120 Minutes Intravenous  Once 03/02/23 1017 03/02/23 1424       Objective:  Vital Signs  Vitals:   03/04/23 0600 03/04/23 0630 03/04/23 0700 03/04/23 0800  BP: (!) 98/48 (!) 91/41 (!) 83/36 (!) 86/41  Pulse: 82 80 80 76  Resp: 18 19 20 20   Temp: (!) 100.8 F (38.2 C) (!) 100.6 F (38.1 C) (!) 100.6 F (38.1 C) 100.2 F (37.9 C)  TempSrc:    Bladder  SpO2: 95% 95% 95% 96%  Weight:      Height:        Intake/Output Summary (Last 24 hours) at 03/04/2023 0931 Last data filed at 03/04/2023 0800 Gross per 24 hour  Intake 290.57 ml  Output 1410 ml  Net -1119.43 ml   Filed  Weights   03/02/23 1014 03/03/23 0700 03/04/23 0205  Weight: 84.7 kg 84.2 kg 84.4 kg    General appearance: Awake alert.  In no distress Resp: Clear to auscultation bilaterally.  Normal effort Cardio: S1-S2 is normal regular.  No S3-S4.  No rubs murmurs or bruit GI: Abdomen is soft.  Nontender nondistended.  Bowel sounds are present normal.  No masses organomegaly Extremities: No edema.  Full range of motion of lower extremities. Neurologic: Alert and oriented x3.  No focal neurological deficits.    Lab Results:  Data Reviewed: I have personally reviewed following labs and reports of the imaging studies  CBC: Recent Labs  Lab 03/02/23 1157 03/02/23 1728 03/03/23 0922 03/04/23 0340  WBC 27.5* 32.0* 43.0* 21.7*  NEUTROABS 23.7*  --   --   --   HGB 8.9* 10.8* 9.6* 8.1*  HCT 28.2* 34.0* 30.0* 24.8*  MCV 89.5 89.2 88.2 86.7  PLT 334 406* 366 286    Basic Metabolic Panel: Recent Labs  Lab 03/02/23 1157 03/02/23 1728 03/03/23 0922 03/03/23 2134 03/04/23 0340  NA 140  --  140 137  --   K 3.4*  --  3.5 3.5  --   CL 103  --  104 103  --   CO2 24  --  22 19*  --   GLUCOSE 85  --  158* 143*  --   BUN 32*  --  34* 37*  --   CREATININE 1.59* 1.68* 1.66* 1.32*  --   CALCIUM 7.8*  --  7.8* 7.6*  --   MG  --   --  1.7  --  1.8  PHOS  --   --  4.2  --  3.3    GFR: Estimated Creatinine Clearance: 37.3 mL/min (A) (by C-G formula based on SCr of 1.32 mg/dL (H)).  Liver Function Tests: Recent Labs  Lab 03/02/23 1157  AST 14*  ALT 9  ALKPHOS 54  BILITOT 1.0  PROT 5.8*  ALBUMIN 2.0*    Coagulation Profile: Recent Labs  Lab 03/02/23 1157  INR 1.3*   CBG: Recent Labs  Lab 03/03/23 1539 03/03/23 1948 03/03/23 2352 03/04/23 0350 03/04/23 0831  GLUCAP 105* 132* 123* 85 93     Recent Results (from the past 240 hour(s))  Urine Culture     Status: Abnormal   Collection Time: 03/02/23 10:15 AM   Specimen: Urine, Random  Result Value Ref Range Status   Specimen  Description URINE, RANDOM  Final   Special Requests   Final    NONE Reflexed from [id number] Performed at [facility], [address] [place], [place] [address]    Culture (A)  Final    >=100,000 COLONIES/mL ESCHERICHIA COLI Confirmed Extended Spectrum Beta-Lactamase Producer (ESBL).  In bloodstream infections from ESBL organisms, carbapenems are preferred over piperacillin/tazobactam. They are shown to have a lower risk of mortality.    Report Status 03/04/2023 FINAL  Final   Organism ID, Bacteria ESCHERICHIA COLI (A)  Final      Susceptibility   Escherichia coli - MIC*    AMPICILLIN >=32 RESISTANT Resistant     CEFAZOLIN >=64 RESISTANT Resistant     CEFEPIME >=32 RESISTANT Resistant     CEFTRIAXONE >=64 RESISTANT Resistant     CIPROFLOXACIN >=4 RESISTANT Resistant     GENTAMICIN >=16 RESISTANT Resistant     IMIPENEM <=0.25 SENSITIVE Sensitive     NITROFURANTOIN <=16 SENSITIVE Sensitive     TRIMETH/SULFA >=320 RESISTANT Resistant     AMPICILLIN/SULBACTAM >=32 RESISTANT Resistant     PIP/TAZO 16 SENSITIVE Sensitive ug/mL    * >=100,000 COLONIES/mL ESCHERICHIA COLI  Resp panel by RT-PCR (RSV, Flu A&B, Covid) Peripheral     Status: None   Collection Time: 03/02/23 10:47 AM   Specimen: Peripheral; Nasal Swab  Result Value Ref Range Status   SARS Coronavirus 2 by RT PCR NEGATIVE NEGATIVE Final   Influenza A by PCR NEGATIVE NEGATIVE Final   Influenza B by PCR NEGATIVE NEGATIVE Final    Comment: (NOTE)  The Xpert Xpress SARS-CoV-2/FLU/RSV plus assay is intended as an aid in the diagnosis of influenza from Nasopharyngeal swab specimens and should not be used as a sole basis for treatment. Nasal washings and aspirates are unacceptable for Xpert Xpress SARS-CoV-2/FLU/RSV testing.  Fact Sheet for Patients: BloggerCourse.com  Fact Sheet for Healthcare Providers: SeriousBroker.it  This test is not yet approved or cleared by the  [place] FDA and has been authorized for detection and/or diagnosis of SARS-CoV-2 by FDA under an Emergency Use Authorization (EUA). This EUA will remain in effect (meaning this test can be used) for the duration of the COVID-19 declaration under Section 564(b)(1) of the Act, 21 U.S.C. section 360bbb-3(b)(1), unless the authorization is terminated or revoked.     Resp Syncytial Virus by PCR NEGATIVE NEGATIVE Final    Comment: (NOTE) Fact Sheet for Patients: BloggerCourse.com  Fact Sheet for Healthcare Providers: SeriousBroker.it  This test is not yet approved or cleared by the [place] FDA and has been authorized for detection and/or diagnosis of SARS-CoV-2 by FDA under an Emergency Use Authorization (EUA). This EUA will remain in effect (meaning this test can be used) for the duration of the COVID-19 declaration under Section 564(b)(1) of the Act, 21 U.S.C. section 360bbb-3(b)(1), unless the authorization is terminated or revoked.  Performed at [facility], [address] [place], [place] [address]   Blood Culture (routine x 2)     Status: None (Preliminary result)   Collection Time: 03/02/23 10:47 AM   Specimen: BLOOD  Result Value Ref Range Status   Specimen Description BLOOD BLOOD RIGHT ARM  Final   Special Requests   Final    BOTTLES DRAWN AEROBIC AND ANAEROBIC Blood Culture results may not be optimal due to an inadequate volume of blood received in culture bottles   Culture   Final    NO GROWTH 2 DAYS Performed at [facility], [address] [place], [place] [address]    Report Status PENDING  Incomplete  Blood Culture (routine x 2)     Status: None (Preliminary result)   Collection Time: 03/02/23 10:47 AM   Specimen: BLOOD  Result Value Ref Range Status   Specimen Description BLOOD BLOOD LEFT ARM  Final   Special Requests   Final    BOTTLES DRAWN AEROBIC AND ANAEROBIC Blood Culture adequate  volume   Culture  Setup Time   Final    ANAEROBIC BOTTLE ONLY GRAM NEGATIVE RODS CRITICAL RESULT CALLED TO, READ BACK BY AND VERIFIED WITH: V BRYK,PHARMD@[id number]  03/03/23 MK Performed at [facility], [address], [place], [place] [address]    Culture GRAM NEGATIVE RODS  Final   Report Status PENDING  Incomplete  Blood Culture ID Panel (Reflexed)     Status: Abnormal   Collection Time: 03/02/23 10:47 AM  Result Value Ref Range Status   Enterococcus faecalis NOT DETECTED NOT DETECTED Final   Enterococcus Faecium NOT DETECTED NOT DETECTED Final   Listeria monocytogenes NOT DETECTED NOT DETECTED Final   Staphylococcus species NOT DETECTED NOT DETECTED Final   Staphylococcus aureus (BCID) NOT DETECTED NOT DETECTED Final   Staphylococcus epidermidis NOT DETECTED NOT DETECTED Final   Staphylococcus lugdunensis NOT DETECTED NOT DETECTED Final   Streptococcus species NOT DETECTED NOT DETECTED Final   Streptococcus agalactiae NOT DETECTED NOT DETECTED Final   Streptococcus pneumoniae NOT DETECTED NOT DETECTED Final   Streptococcus pyogenes NOT DETECTED NOT DETECTED Final   A.calcoaceticus-baumannii NOT DETECTED NOT DETECTED Final  Bacteroides fragilis NOT DETECTED NOT DETECTED Final   Enterobacterales DETECTED (A) NOT DETECTED Final    Comment: Enterobacterales represent a large order of gram negative bacteria, not a single organism. CRITICAL RESULT CALLED TO, READ BACK BY AND VERIFIED WITH: V BRYK,PHARMD@0645  03/03/23 MK    Enterobacter cloacae complex NOT DETECTED NOT DETECTED Final   Escherichia coli DETECTED (A) NOT DETECTED Final    Comment: CRITICAL RESULT CALLED TO, READ BACK BY AND VERIFIED WITH: V BRYK,PHARMD@0645  03/03/23 MK    Klebsiella aerogenes NOT DETECTED NOT DETECTED Final   Klebsiella oxytoca NOT DETECTED NOT DETECTED Final   Klebsiella pneumoniae NOT DETECTED NOT DETECTED Final   Proteus species NOT DETECTED NOT DETECTED Final   Salmonella species NOT DETECTED  NOT DETECTED Final   Serratia marcescens NOT DETECTED NOT DETECTED Final   Haemophilus influenzae NOT DETECTED NOT DETECTED Final   Neisseria meningitidis NOT DETECTED NOT DETECTED Final   Pseudomonas aeruginosa NOT DETECTED NOT DETECTED Final   Stenotrophomonas maltophilia NOT DETECTED NOT DETECTED Final   Candida albicans NOT DETECTED NOT DETECTED Final   Candida auris NOT DETECTED NOT DETECTED Final   Candida glabrata NOT DETECTED NOT DETECTED Final   Candida krusei NOT DETECTED NOT DETECTED Final   Candida parapsilosis NOT DETECTED NOT DETECTED Final   Candida tropicalis NOT DETECTED NOT DETECTED Final   Cryptococcus neoformans/gattii NOT DETECTED NOT DETECTED Final   CTX-M ESBL DETECTED (A) NOT DETECTED Final    Comment: CRITICAL RESULT CALLED TO, READ BACK BY AND VERIFIED WITH: V BRYK,PHARMD@0645  03/03/23 MK (NOTE) Extended spectrum beta-lactamase detected. Recommend a carbapenem as initial therapy.      Carbapenem resistance IMP NOT DETECTED NOT DETECTED Final   Carbapenem resistance KPC NOT DETECTED NOT DETECTED Final   Carbapenem resistance NDM NOT DETECTED NOT DETECTED Final   Carbapenem resist OXA 48 LIKE NOT DETECTED NOT DETECTED Final   Carbapenem resistance VIM NOT DETECTED NOT DETECTED Final    Comment: Performed at [facility], [address], [place], [place] [address]  MRSA Next Gen by PCR, Nasal     Status: None   Collection Time: 03/02/23  6:48 PM   Specimen: Nasal Mucosa; Nasal Swab  Result Value Ref Range Status   MRSA by PCR Next Gen NOT DETECTED NOT DETECTED Final    Comment: (NOTE) The GeneXpert MRSA Assay (FDA approved for NASAL specimens only), is one component of a comprehensive MRSA colonization surveillance program. It is not intended to diagnose MRSA infection nor to guide or monitor treatment for MRSA infections. Test performance is not FDA approved in patients less than 43 years old. Performed at [facility], [address],  [place], [place] [address]       Radiology Studies: CT ABDOMEN PELVIS W CONTRAST  Result Date: 03/03/2023 CLINICAL DATA:  Abdominal pain, sepsis EXAM: CT ABDOMEN AND PELVIS WITH CONTRAST TECHNIQUE: Multidetector CT imaging of the abdomen and pelvis was performed using the standard protocol following bolus administration of intravenous contrast. RADIATION DOSE REDUCTION: This exam was performed according to the departmental dose-optimization program which includes automated exposure control, adjustment of the mA and/or kV according to patient size and/or use of iterative reconstruction technique. CONTRAST:  60mL OMNIPAQUE IOHEXOL 350 MG/ML SOLN COMPARISON:  01/28/2023 FINDINGS: Lower chest: Trace bilateral pleural effusions. Scattered hypoventilatory changes at the lung bases. Dense calcification of the mitral annulus and coronary artery atherosclerosis again noted. Hepatobiliary: No focal liver abnormality is seen. Status post cholecystectomy. No biliary dilatation. Pancreas:  Unremarkable. No pancreatic ductal dilatation or surrounding inflammatory changes. Spleen: Normal in size without acute focal abnormality. Adrenals/Urinary Tract: Bladder is decompressed with a Foley catheter. Even allowing for changes due to under distension, there is moderate diffuse bladder wall thickening concerning for superimposed cystitis. Please correlate with urinalysis. There is mild bilateral hydronephrosis and hydroureter, with associated mucosal enhancement throughout the ureters and renal pelves concerning for urinary tract infection. The subtle areas of geographic decreased attenuation within the renal parenchyma noted on prior CT have resolved in the interim. There is no CT evidence of acute pyelonephritis on this exam. No evidence of renal abscess or urinary tract calculi. The adrenals are stable and unremarkable. Stomach/Bowel: No bowel obstruction or ileus. There is a large amount of retained stool throughout the colon  consistent with constipation. Specifically, a large amount of retained stool and excreted contrast within the rectal vault may reflect fecal impaction. This may have some mass effect upon the bladder and bilateral UVJ's, which could contribute to the obstructive uropathy described above. Nonspecific wall thickening of the anal rectal junction could reflect changes of proctitis. Mild rectal wall thickening and perirectal fat stranding could reflect superimposed stercoral colitis as well. The appendix is surgically absent. Vascular/Lymphatic: Diffuse atherosclerosis throughout the aorta and its branches again noted. No pathologic adenopathy. Reproductive: Status post hysterectomy. No adnexal masses. Other: Trace free fluid within the lower abdomen and pelvis, nonspecific. No free intraperitoneal gas. No abdominal wall hernia. Musculoskeletal: Right hip arthroplasty. No acute or destructive bony abnormalities. Reconstructed images demonstrate no additional findings. IMPRESSION: 1. Moderate bladder wall thickening, even in light of decompression by Foley catheter, concerning for cystitis. Mild bilateral hydroureteronephrosis with associated mucosal enhancement of the renal collecting systems and ureters could reflect ascending urinary tract infection. No evidence of pyelonephritis on this exam. Please correlate with urinalysis. 2. Significant fecal retention consistent with constipation. Marked retained stool within the rectal vault consistent with fecal impaction, with likely superimposed changes of stercoral colitis. 3. Prominent wall thickening of the anorectal junction, which could reflect proctitis. Correlation with physical exam and visual inspection is recommended, to exclude superimposed neoplasm. 4. Trace pelvic free fluid, likely reactive. 5. Trace bilateral pleural effusions. 6. Aortic Atherosclerosis (ICD10-I70.0). Coronary artery atherosclerosis. Electronically Signed   By: Sharlet Salina M.D.   On:  03/03/2023 15:56   DG Chest Port 1 View  Result Date: 03/02/2023 CLINICAL DATA:  Sepsis EXAM: PORTABLE CHEST 1 VIEW COMPARISON:  01/28/2023 FINDINGS: Underinflation. Once again there are areas of interstitial thickening bilaterally. No pneumothorax, effusion or consolidation. Normal cardiopericardial silhouette with calcified aorta. Prominent calcifications along the mitral valve annulus. Loop recorder overlying the lower left hemithorax. Overlapping cardiac leads. Degenerative changes seen of the spine and shoulders. Osteopenia. Old right rib fractures. IMPRESSION: Underinflation with interstitial changes, chronic. Loop recorder. Electronically Signed   By: Karen Kays M.D.   On: 03/02/2023 12:06       LOS: 2 days   Braeden Dolinski Rito Ehrlich  [facility] Pager on [email or web address]  03/04/2023, 9:31 AM

## 2023-03-05 ENCOUNTER — Other Ambulatory Visit: Payer: Self-pay

## 2023-03-05 DIAGNOSIS — D649 Anemia, unspecified: Secondary | ICD-10-CM | POA: Diagnosis not present

## 2023-03-05 DIAGNOSIS — B9629 Other Escherichia coli [E. coli] as the cause of diseases classified elsewhere: Secondary | ICD-10-CM

## 2023-03-05 DIAGNOSIS — R7881 Bacteremia: Secondary | ICD-10-CM | POA: Diagnosis not present

## 2023-03-05 DIAGNOSIS — B9620 Unspecified Escherichia coli [E. coli] as the cause of diseases classified elsewhere: Secondary | ICD-10-CM

## 2023-03-05 DIAGNOSIS — N390 Urinary tract infection, site not specified: Secondary | ICD-10-CM | POA: Diagnosis not present

## 2023-03-05 LAB — CBC
HCT: 24.7 % — ABNORMAL LOW (ref 36.0–46.0)
Hemoglobin: 8.1 g/dL — ABNORMAL LOW (ref 12.0–15.0)
MCH: 29.1 pg (ref 26.0–34.0)
MCHC: 32.8 g/dL (ref 30.0–36.0)
MCV: 88.8 fL (ref 80.0–100.0)
Platelets: 296 10*3/uL (ref 150–400)
RBC: 2.78 MIL/uL — ABNORMAL LOW (ref 3.87–5.11)
RDW: 15.5 % (ref 11.5–15.5)
WBC: 16.5 10*3/uL — ABNORMAL HIGH (ref 4.0–10.5)
nRBC: 0 % (ref 0.0–0.2)

## 2023-03-05 LAB — CULTURE, BLOOD (ROUTINE X 2): Special Requests: ADEQUATE

## 2023-03-05 LAB — BASIC METABOLIC PANEL
Anion gap: 10 (ref 5–15)
BUN: 34 mg/dL — ABNORMAL HIGH (ref 8–23)
CO2: 25 mmol/L (ref 22–32)
Calcium: 7.9 mg/dL — ABNORMAL LOW (ref 8.9–10.3)
Chloride: 105 mmol/L (ref 98–111)
Creatinine, Ser: 1.23 mg/dL — ABNORMAL HIGH (ref 0.44–1.00)
GFR, Estimated: 46 mL/min — ABNORMAL LOW (ref >60)
Glucose, Bld: 135 mg/dL — ABNORMAL HIGH (ref 70–99)
Potassium: 3.5 mmol/L (ref 3.5–5.1)
Sodium: 140 mmol/L (ref 135–145)

## 2023-03-05 LAB — GLUCOSE, CAPILLARY
Glucose-Capillary: 126 mg/dL — ABNORMAL HIGH (ref 70–99)
Glucose-Capillary: 132 mg/dL — ABNORMAL HIGH (ref 70–99)
Glucose-Capillary: 153 mg/dL — ABNORMAL HIGH (ref 70–99)
Glucose-Capillary: 185 mg/dL — ABNORMAL HIGH (ref 70–99)
Glucose-Capillary: 195 mg/dL — ABNORMAL HIGH (ref 70–99)
Glucose-Capillary: 220 mg/dL — ABNORMAL HIGH (ref 70–99)

## 2023-03-05 MED ORDER — INSULIN ASPART 100 UNIT/ML IJ SOLN
0.0000 [IU] | Freq: Every day | INTRAMUSCULAR | Status: DC
  Administered 2023-03-05: 2 [IU] via SUBCUTANEOUS

## 2023-03-05 MED ORDER — INSULIN ASPART 100 UNIT/ML IJ SOLN
0.0000 [IU] | Freq: Three times a day (TID) | INTRAMUSCULAR | Status: DC
  Administered 2023-03-05: 3 [IU] via SUBCUTANEOUS
  Administered 2023-03-05: 2 [IU] via SUBCUTANEOUS
  Administered 2023-03-05 – 2023-03-06 (×2): 3 [IU] via SUBCUTANEOUS
  Administered 2023-03-06: 5 [IU] via SUBCUTANEOUS
  Administered 2023-03-06: 2 [IU] via SUBCUTANEOUS
  Administered 2023-03-07 – 2023-03-08 (×4): 3 [IU] via SUBCUTANEOUS
  Administered 2023-03-08: 2 [IU] via SUBCUTANEOUS

## 2023-03-05 MED ORDER — INSULIN ASPART 100 UNIT/ML IJ SOLN: 0.0000 [IU] | Freq: Three times a day (TID) | INTRAMUSCULAR | Status: DC

## 2023-03-05 MED ORDER — POTASSIUM CHLORIDE CRYS ER 20 MEQ PO TBCR
20.0000 meq | EXTENDED_RELEASE_TABLET | Freq: Once | ORAL | Status: AC
  Administered 2023-03-05: 20 meq via ORAL
  Filled 2023-03-05: qty 1

## 2023-03-05 NOTE — Progress Notes (Addendum)
[facility] PROGRESS NOTE   [name] VHQ:[id number] DOB: May 29, 1945 DOA: 03/02/2023  PCP: Paulina Fusi, MD  Brief History: 77 yo F PMH DNR/I status, HFpEF, COPD, DM2, HTN, Dysphasia, recent ESBL ecoli bacteremia + pyelo with ESBL e coli and PsA (9/23-10/2/24) during which time she required ICU stay and pressor support + mero for abx coverage, who presented to ED 10/25 from [facility] w AMS.  She was noted to be hypotensive.  Received IV fluids and then started on pressors.  She was started on broad-spectrum antibiotics.  Admitted to the ICU.  Consultants: Critical care medicine.  Procedures: Foley catheter placement in the ED    Subjective/Interval History: Patient sitting up on the bed eating her breakfast.  Much more awake and alert compared to yesterday morning.  Denies any complaints.  Does mention neuropathy in her legs which prevents her from walking.     Assessment/Plan:  Severe sepsis with septic shock secondary to recurrent pyelonephritis and ESBL E. coli bacteremia Blood cultures positive for ESBL E. coli.  UA also consistent with infection CT of the abdomen pelvis showed urinary bladder wall thickening with mild bilateral hydroureteronephrosis thought to be secondary to cystitis.  Other findings include significant constipation with fecal retention.  No abscess was noted. Fever curve has improved.  WBC is also improving.  Blood pressures have stabilized. Continue midodrine for now.  Mentation has significantly improved as well. Of note she was admitted for similar issue last month and was discharged on 10/2.  At that time she received a 10-day course of antibiotics.  May need a longer course this time around.  Acute toxic encephalopathy Mentation has significantly improved.  Noted to have coughing episodes while eating breakfast this morning.  She was seen by speech therapy during previous hospitalization.  At that time dysphagia 3 diet with thin liquids  was recommended.  Will change her diet to reflect this.  If she continues to have coughing episodes then may need to reconsult SLP.  Acute kidney injury on chronic kidney disease stage IIIa She had a creatinine of 0.74 on October 2.  Came in with creatinine of 1.59 which increased to 1.66 and then noted to be improving.  Monitor urine output.   Nephrotoxic agents.  Constipation/fecal impaction She was disimpacted in the ICU.  Placed on aggressive bowel regimen.  Had multiple bowel movements yesterday.   TSH was normal in September.  Chronic diastolic CHF Echocardiogram from July 2024 showed grade 2 diastolic dysfunction with normal systolic function.  If at all she is hypovolemic.  Monitor volume status closely. Noted to be on aspirin Plavix ezetimibe statin.  Normocytic anemia Drop in hemoglobin is dilutional.  No evidence of overt bleeding.  Diabetes mellitus type 2 with peripheral neuropathy SSI.  CBGs.  Goals of care ICU team discussed with patient's spouse.  He confirmed DNR and DNI status.  Okay with ICU.  Peripheral pressors were okay.  No central line or art line.  Obesity Estimated body mass index is 32.96 kg/m as calculated from the following:   Height as of this encounter: 5\' 3"  (1.6 m).   Weight as of this encounter: 84.4 kg.  DVT Prophylaxis: Subcutaneous heparin Code Status: DNR and DNI Family Communication: No family at bedside Disposition Plan: SNF when medically stable.  Family requesting different SNF.  TOC will be consulted.  Status is: Inpatient Remains inpatient appropriate because: IV antibiotics      Medications: Scheduled:  aspirin EC  81  mg Oral Daily   atorvastatin  80 mg Oral QHS   Chlorhexidine Gluconate Cloth  6 each Topical Daily   clopidogrel  75 mg Oral Daily   ezetimibe  10 mg Oral Daily   fesoterodine  4 mg Oral Daily   gabapentin  300 mg Oral BID   Gerhardt's butt cream   Topical QID   heparin  5,000 Units Subcutaneous Q8H    insulin aspart  0-15 Units Subcutaneous TID WC   insulin aspart  0-5 Units Subcutaneous QHS   midodrine  10 mg Oral TID WC   pantoprazole  40 mg Oral Q1200   polyethylene glycol  17 g Oral BID   rOPINIRole  1 mg Oral QHS   senna  2 tablet Oral BID   Continuous:  meropenem (MERREM) IV Stopped (03/04/23 2355)   MVH:[id number], bisacodyl, docusate sodium, Gerhardt's butt cream, ondansetron (ZOFRAN) IV  Antibiotics: Anti-infectives (From admission, onward)    Start     Dose/Rate Route Frequency Ordered Stop   03/05/23 0000  meropenem (MERREM) 2 g in sodium chloride 0.9 % 100 mL IVPB        2 g 280 mL/hr over 30 Minutes Intravenous Every 12 hours 03/04/23 1221     03/04/23 1245  meropenem (MERREM) 1 g in sodium chloride 0.9 % 100 mL IVPB        1 g 200 mL/hr over 30 Minutes Intravenous  Once 03/04/23 1221 03/04/23 1355   03/03/23 2200  meropenem (MERREM) 1 g in sodium chloride 0.9 % 100 mL IVPB  Status:  Discontinued        1 g 200 mL/hr over 30 Minutes Intravenous Every 12 hours 03/03/23 1307 03/04/23 1221   03/02/23 2121  vancomycin variable dose per unstable renal function (pharmacist dosing)  Status:  Discontinued         Does not apply See admin instructions 03/02/23 2121 03/03/23 0850   03/02/23 1545  meropenem (MERREM) 2 g in sodium chloride 0.9 % 100 mL IVPB  Status:  Discontinued        2 g 280 mL/hr over 30 Minutes Intravenous Every 12 hours 03/02/23 1540 03/03/23 1307   03/02/23 1030  ceFEPIme (MAXIPIME) 2 g in sodium chloride 0.9 % 100 mL IVPB        2 g 200 mL/hr over 30 Minutes Intravenous  Once 03/02/23 1016 03/02/23 1124   03/02/23 1030  metroNIDAZOLE (FLAGYL) IVPB 500 mg        500 mg 100 mL/hr over 60 Minutes Intravenous  Once 03/02/23 1016 03/02/23 1158   03/02/23 1030  vancomycin (VANCOCIN) IVPB 1000 mg/200 mL premix  Status:  Discontinued        1,000 mg 200 mL/hr over 60 Minutes Intravenous  Once 03/02/23 1016 03/02/23 1017   03/02/23 1030  vancomycin  (VANCOREADY) IVPB 1500 mg/300 mL        1,500 mg 150 mL/hr over 120 Minutes Intravenous  Once 03/02/23 1017 03/02/23 1424       Objective:  Vital Signs  Vitals:   03/05/23 0600 03/05/23 0630 03/05/23 0700 03/05/23 0800  BP: (!) 109/54 (!) 107/55 (!) 110/55 (!) 86/74  Pulse: 64 65 68 79  Resp: 10 20 (!) 0 (!) 9  Temp: 98.8 F (37.1 C) 99 F (37.2 C) 99 F (37.2 C) 99.1 F (37.3 C)  TempSrc:    Bladder  SpO2: 100% 99% 100% 98%  Weight:      Height:  Intake/Output Summary (Last 24 hours) at 03/05/2023 1008 Last data filed at 03/05/2023 0800 Gross per 24 hour  Intake 680.07 ml  Output 1110 ml  Net -429.93 ml   Filed Weights   03/03/23 0700 03/04/23 0205 03/05/23 0236  Weight: 84.2 kg 84.4 kg 84.4 kg    General appearance: Much more awake and alert compared to yesterday. Resp: Clear to auscultation bilaterally.  Normal effort Cardio: S1-S2 is normal regular.  No S3-S4.  No rubs murmurs or bruit GI: Abdomen is soft.  Nontender nondistended.  Bowel sounds are present normal.  No masses organomegaly Extremities: No edema.    Lab Results:  Data Reviewed: I have personally reviewed following labs and reports of the imaging studies  CBC: Recent Labs  Lab 03/02/23 1157 03/02/23 1728 03/03/23 0922 03/04/23 0340 03/05/23 0437  WBC 27.5* 32.0* 43.0* 21.7* 16.5*  NEUTROABS 23.7*  --   --   --   --   HGB 8.9* 10.8* 9.6* 8.1* 8.1*  HCT 28.2* 34.0* 30.0* 24.8* 24.7*  MCV 89.5 89.2 88.2 86.7 88.8  PLT 334 406* 366 286 296    Basic Metabolic Panel: Recent Labs  Lab 03/02/23 1157 03/02/23 1728 03/03/23 0922 03/03/23 2134 03/04/23 0340 03/05/23 0437  NA 140  --  140 137  --  140  K 3.4*  --  3.5 3.5  --  3.5  CL 103  --  104 103  --  105  CO2 24  --  22 19*  --  25  GLUCOSE 85  --  158* 143*  --  135*  BUN 32*  --  34* 37*  --  34*  CREATININE 1.59* 1.68* 1.66* 1.32*  --  1.23*  CALCIUM 7.8*  --  7.8* 7.6*  --  7.9*  MG  --   --  1.7  --  1.8  --    PHOS  --   --  4.2  --  3.3  --     GFR: Estimated Creatinine Clearance: 40.1 mL/min (A) (by C-G formula based on SCr of 1.23 mg/dL (H)).  Liver Function Tests: Recent Labs  Lab 03/02/23 1157  AST 14*  ALT 9  ALKPHOS 54  BILITOT 1.0  PROT 5.8*  ALBUMIN 2.0*    Coagulation Profile: Recent Labs  Lab 03/02/23 1157  INR 1.3*   CBG: Recent Labs  Lab 03/04/23 1730 03/04/23 2006 03/05/23 0001 03/05/23 0406 03/05/23 0736  GLUCAP 128* 129* 195* 126* 153*     Recent Results (from the past 240 hour(s))  Urine Culture     Status: Abnormal   Collection Time: 03/02/23 10:15 AM   Specimen: Urine, Random  Result Value Ref Range Status   Specimen Description URINE, RANDOM  Final   Special Requests   Final    NONE Reflexed from [id number] Performed at [facility], [address], [place], [place] [address]    Culture (A)  Final    >=100,000 COLONIES/mL ESCHERICHIA COLI Confirmed Extended Spectrum Beta-Lactamase Producer (ESBL).  In bloodstream infections from ESBL organisms, carbapenems are preferred over piperacillin/tazobactam. They are shown to have a lower risk of mortality.    Report Status 03/04/2023 FINAL  Final   Organism ID, Bacteria ESCHERICHIA COLI (A)  Final      Susceptibility   Escherichia coli - MIC*    AMPICILLIN >=32 RESISTANT Resistant     CEFAZOLIN >=64 RESISTANT Resistant     CEFEPIME >=32 RESISTANT Resistant     CEFTRIAXONE >=  64 RESISTANT Resistant     CIPROFLOXACIN >=4 RESISTANT Resistant     GENTAMICIN >=16 RESISTANT Resistant     IMIPENEM <=0.25 SENSITIVE Sensitive     NITROFURANTOIN <=16 SENSITIVE Sensitive     TRIMETH/SULFA >=320 RESISTANT Resistant     AMPICILLIN/SULBACTAM >=32 RESISTANT Resistant     PIP/TAZO 16 SENSITIVE Sensitive ug/mL    * >=100,000 COLONIES/mL ESCHERICHIA COLI  Resp panel by RT-PCR (RSV, Flu A&B, Covid) Peripheral     Status: None   Collection Time: 03/02/23 10:47 AM   Specimen: Peripheral; Nasal Swab  Result  Value Ref Range Status   SARS Coronavirus 2 by RT PCR NEGATIVE NEGATIVE Final   Influenza A by PCR NEGATIVE NEGATIVE Final   Influenza B by PCR NEGATIVE NEGATIVE Final    Comment: (NOTE) The Xpert Xpress SARS-CoV-2/FLU/RSV plus assay is intended as an aid in the diagnosis of influenza from Nasopharyngeal swab specimens and should not be used as a sole basis for treatment. Nasal washings and aspirates are unacceptable for Xpert Xpress SARS-CoV-2/FLU/RSV testing.  Fact Sheet for Patients: BloggerCourse.com  Fact Sheet for Healthcare Providers: SeriousBroker.it  This test is not yet approved or cleared by the [place] FDA and has been authorized for detection and/or diagnosis of SARS-CoV-2 by FDA under an Emergency Use Authorization (EUA). This EUA will remain in effect (meaning this test can be used) for the duration of the COVID-19 declaration under Section 564(b)(1) of the Act, 21 U.S.C. section 360bbb-3(b)(1), unless the authorization is terminated or revoked.     Resp Syncytial Virus by PCR NEGATIVE NEGATIVE Final    Comment: (NOTE) Fact Sheet for Patients: BloggerCourse.com  Fact Sheet for Healthcare Providers: SeriousBroker.it  This test is not yet approved or cleared by the [place] FDA and has been authorized for detection and/or diagnosis of SARS-CoV-2 by FDA under an Emergency Use Authorization (EUA). This EUA will remain in effect (meaning this test can be used) for the duration of the COVID-19 declaration under Section 564(b)(1) of the Act, 21 U.S.C. section 360bbb-3(b)(1), unless the authorization is terminated or revoked.  Performed at [facility], [address], [place], [place] [address]   Blood Culture (routine x 2)     Status: None (Preliminary result)   Collection Time: 03/02/23 10:47 AM   Specimen: BLOOD  Result Value Ref Range Status    Specimen Description BLOOD BLOOD RIGHT ARM  Final   Special Requests   Final    BOTTLES DRAWN AEROBIC AND ANAEROBIC Blood Culture results may not be optimal due to an inadequate volume of blood received in culture bottles   Culture  Setup Time   Final    GRAM NEGATIVE RODS AEROBIC BOTTLE ONLY CRITICAL VALUE NOTED.  VALUE IS CONSISTENT WITH PREVIOUSLY REPORTED AND CALLED VALUE.    Culture   Final    NO GROWTH 3 DAYS Performed at [facility], [address], [place], [place] [address]    Report Status PENDING  Incomplete  Blood Culture (routine x 2)     Status: Abnormal   Collection Time: 03/02/23 10:47 AM   Specimen: BLOOD  Result Value Ref Range Status   Specimen Description BLOOD BLOOD LEFT ARM  Final   Special Requests   Final    BOTTLES DRAWN AEROBIC AND ANAEROBIC Blood Culture adequate volume   Culture  Setup Time   Final    IN BOTH AEROBIC AND ANAEROBIC BOTTLES GRAM NEGATIVE RODS CRITICAL RESULT CALLED TO, READ BACK BY  AND VERIFIED WITH: V BRYK,PHARMD@0645  03/03/23 MK Performed at [facility], [address], [place], [place] [address]    Culture (A)  Final    ESCHERICHIA COLI Confirmed Extended Spectrum Beta-Lactamase Producer (ESBL).  In bloodstream infections from ESBL organisms, carbapenems are preferred over piperacillin/tazobactam. They are shown to have a lower risk of mortality.    Report Status 03/05/2023 FINAL  Final   Organism ID, Bacteria ESCHERICHIA COLI  Final   Organism ID, Bacteria ESCHERICHIA COLI  Final      Susceptibility   Escherichia coli - KIRBY BAUER*    CEFAZOLIN RESISTANT Resistant    Escherichia coli - MIC*    AMPICILLIN >=32 RESISTANT Resistant     CEFEPIME >=32 RESISTANT Resistant     CEFTAZIDIME RESISTANT Resistant     CEFTRIAXONE >=64 RESISTANT Resistant     CIPROFLOXACIN >=4 RESISTANT Resistant     GENTAMICIN >=16 RESISTANT Resistant     IMIPENEM <=0.25 SENSITIVE Sensitive     TRIMETH/SULFA >=320 RESISTANT Resistant      AMPICILLIN/SULBACTAM >=32 RESISTANT Resistant     PIP/TAZO 16 SENSITIVE Sensitive ug/mL    * ESCHERICHIA COLI    ESCHERICHIA COLI  Blood Culture ID Panel (Reflexed)     Status: Abnormal   Collection Time: 03/02/23 10:47 AM  Result Value Ref Range Status   Enterococcus faecalis NOT DETECTED NOT DETECTED Final   Enterococcus Faecium NOT DETECTED NOT DETECTED Final   Listeria monocytogenes NOT DETECTED NOT DETECTED Final   Staphylococcus species NOT DETECTED NOT DETECTED Final   Staphylococcus aureus (BCID) NOT DETECTED NOT DETECTED Final   Staphylococcus epidermidis NOT DETECTED NOT DETECTED Final   Staphylococcus lugdunensis NOT DETECTED NOT DETECTED Final   Streptococcus species NOT DETECTED NOT DETECTED Final   Streptococcus agalactiae NOT DETECTED NOT DETECTED Final   Streptococcus pneumoniae NOT DETECTED NOT DETECTED Final   Streptococcus pyogenes NOT DETECTED NOT DETECTED Final   A.calcoaceticus-baumannii NOT DETECTED NOT DETECTED Final   Bacteroides fragilis NOT DETECTED NOT DETECTED Final   Enterobacterales DETECTED (A) NOT DETECTED Final    Comment: Enterobacterales represent a large order of gram negative bacteria, not a single organism. CRITICAL RESULT CALLED TO, READ BACK BY AND VERIFIED WITH: V BRYK,PHARMD@0645  03/03/23 MK    Enterobacter cloacae complex NOT DETECTED NOT DETECTED Final   Escherichia coli DETECTED (A) NOT DETECTED Final    Comment: CRITICAL RESULT CALLED TO, READ BACK BY AND VERIFIED WITH: V BRYK,PHARMD@0645  03/03/23 MK    Klebsiella aerogenes NOT DETECTED NOT DETECTED Final   Klebsiella oxytoca NOT DETECTED NOT DETECTED Final   Klebsiella pneumoniae NOT DETECTED NOT DETECTED Final   Proteus species NOT DETECTED NOT DETECTED Final   Salmonella species NOT DETECTED NOT DETECTED Final   Serratia marcescens NOT DETECTED NOT DETECTED Final   Haemophilus influenzae NOT DETECTED NOT DETECTED Final   Neisseria meningitidis NOT DETECTED NOT DETECTED Final    Pseudomonas aeruginosa NOT DETECTED NOT DETECTED Final   Stenotrophomonas maltophilia NOT DETECTED NOT DETECTED Final   Candida albicans NOT DETECTED NOT DETECTED Final   Candida auris NOT DETECTED NOT DETECTED Final   Candida glabrata NOT DETECTED NOT DETECTED Final   Candida krusei NOT DETECTED NOT DETECTED Final   Candida parapsilosis NOT DETECTED NOT DETECTED Final   Candida tropicalis NOT DETECTED NOT DETECTED Final   Cryptococcus neoformans/gattii NOT DETECTED NOT DETECTED Final   CTX-M ESBL DETECTED (A) NOT DETECTED Final    Comment: CRITICAL RESULT CALLED TO, READ BACK BY AND VERIFIED  WITH: V BRYK,PHARMD@0645  03/03/23 MK (NOTE) Extended spectrum beta-lactamase detected. Recommend a carbapenem as initial therapy.      Carbapenem resistance IMP NOT DETECTED NOT DETECTED Final   Carbapenem resistance KPC NOT DETECTED NOT DETECTED Final   Carbapenem resistance NDM NOT DETECTED NOT DETECTED Final   Carbapenem resist OXA 48 LIKE NOT DETECTED NOT DETECTED Final   Carbapenem resistance VIM NOT DETECTED NOT DETECTED Final    Comment: Performed at [facility], [address] [place], [place] [address]  MRSA Next Gen by PCR, Nasal     Status: None   Collection Time: 03/02/23  6:48 PM   Specimen: Nasal Mucosa; Nasal Swab  Result Value Ref Range Status   MRSA by PCR Next Gen NOT DETECTED NOT DETECTED Final    Comment: (NOTE) The GeneXpert MRSA Assay (FDA approved for NASAL specimens only), is one component of a comprehensive MRSA colonization surveillance program. It is not intended to diagnose MRSA infection nor to guide or monitor treatment for MRSA infections. Test performance is not FDA approved in patients less than 76 years old. Performed at [facility], [address] [place], [place] [address]       Radiology Studies: CT ABDOMEN PELVIS W CONTRAST  Result Date: 03/03/2023 CLINICAL DATA:  Abdominal pain, sepsis EXAM: CT ABDOMEN AND PELVIS WITH CONTRAST  TECHNIQUE: Multidetector CT imaging of the abdomen and pelvis was performed using the standard protocol following bolus administration of intravenous contrast. RADIATION DOSE REDUCTION: This exam was performed according to the departmental dose-optimization program which includes automated exposure control, adjustment of the mA and/or kV according to patient size and/or use of iterative reconstruction technique. CONTRAST:  60mL OMNIPAQUE IOHEXOL 350 MG/ML SOLN COMPARISON:  01/28/2023 FINDINGS: Lower chest: Trace bilateral pleural effusions. Scattered hypoventilatory changes at the lung bases. Dense calcification of the mitral annulus and coronary artery atherosclerosis again noted. Hepatobiliary: No focal liver abnormality is seen. Status post cholecystectomy. No biliary dilatation. Pancreas: Unremarkable. No pancreatic ductal dilatation or surrounding inflammatory changes. Spleen: Normal in size without acute focal abnormality. Adrenals/Urinary Tract: Bladder is decompressed with a Foley catheter. Even allowing for changes due to under distension, there is moderate diffuse bladder wall thickening concerning for superimposed cystitis. Please correlate with urinalysis. There is mild bilateral hydronephrosis and hydroureter, with associated mucosal enhancement throughout the ureters and renal pelves concerning for urinary tract infection. The subtle areas of geographic decreased attenuation within the renal parenchyma noted on prior CT have resolved in the interim. There is no CT evidence of acute pyelonephritis on this exam. No evidence of renal abscess or urinary tract calculi. The adrenals are stable and unremarkable. Stomach/Bowel: No bowel obstruction or ileus. There is a large amount of retained stool throughout the colon consistent with constipation. Specifically, a large amount of retained stool and excreted contrast within the rectal vault may reflect fecal impaction. This may have some mass effect upon the  bladder and bilateral UVJ's, which could contribute to the obstructive uropathy described above. Nonspecific wall thickening of the anal rectal junction could reflect changes of proctitis. Mild rectal wall thickening and perirectal fat stranding could reflect superimposed stercoral colitis as well. The appendix is surgically absent. Vascular/Lymphatic: Diffuse atherosclerosis throughout the aorta and its branches again noted. No pathologic adenopathy. Reproductive: Status post hysterectomy. No adnexal masses. Other: Trace free fluid within the lower abdomen and pelvis, nonspecific. No free intraperitoneal gas. No abdominal wall hernia. Musculoskeletal: Right hip arthroplasty. No acute or destructive bony abnormalities. Reconstructed  images demonstrate no additional findings. IMPRESSION: 1. Moderate bladder wall thickening, even in light of decompression by Foley catheter, concerning for cystitis. Mild bilateral hydroureteronephrosis with associated mucosal enhancement of the renal collecting systems and ureters could reflect ascending urinary tract infection. No evidence of pyelonephritis on this exam. Please correlate with urinalysis. 2. Significant fecal retention consistent with constipation. Marked retained stool within the rectal vault consistent with fecal impaction, with likely superimposed changes of stercoral colitis. 3. Prominent wall thickening of the anorectal junction, which could reflect proctitis. Correlation with physical exam and visual inspection is recommended, to exclude superimposed neoplasm. 4. Trace pelvic free fluid, likely reactive. 5. Trace bilateral pleural effusions. 6. Aortic Atherosclerosis (ICD10-I70.0). Coronary artery atherosclerosis. Electronically Signed   By: Sharlet Salina M.D.   On: 03/03/2023 15:56       LOS: 3 days   Emerlyn Mehlhoff Rito Ehrlich  [facility] Pager on [email or web address]  03/05/2023, 10:08 AM

## 2023-03-05 NOTE — Plan of Care (Signed)

## 2023-03-05 NOTE — Evaluation (Signed)
Physical Therapy Brief Evaluation and Discharge Note Patient Details Name: [name] MRN: [id number] DOB: 1945-07-20 Today's Date: 03/05/2023   History of Present Illness  77 yo female admitted 10/25 from [facility] with AMS, hypotension, sepsis and recurrent pyelonephritis. PMhx: [facility] admission for UTI 9/22-10/2. Chronic HFpEF, COPD, DM, ESBL E. coli bacteremia due to acute pyelonephritis  Clinical Impression  Pt pleasant and reports progressive weakness, no longer receiving therapy at SNF. Pt states she has assist for all mobility and ADLs with pt unable to report how she manages toileting at baseline. Pt max -total assist for all functional mobility with anticipation she is at or near baseline. Will defer any further needs to facility. No further acute needs with pt aware and agreeable. Positioned pt in partial chair position and encouraged mobility and repositioning with staff.        PT Assessment Patient does not need any further PT services  Assistance Needed at Discharge  Frequent or constant Supervision/Assistance    Equipment Recommendations Hoyer lift;Hospital bed  Recommendations for Other Services       Precautions/Restrictions Precautions Precautions: Fall        Mobility  Bed Mobility Rolling: Max assist, Total assist, Used rails Supine/Sidelying to sit: Max assist, 2 for physical assistance Sit to supine/sidelying: Max assist, 2 for physical assistance General bed mobility comments: max assist to roll to left, total assist to roll to right, max assist to pivot to EOB with HOB 35 degrees, max+ 2 to return to supine. Total +2 to slide up in bed  Transfers                   General transfer comment: unable pt reports she wan't transferring at baseline, will require lift    Ambulation/Gait              Home Activity Instructions    Stairs            Modified Rankin (Stroke Patients Only)        Balance Overall  balance assessment: Needs assistance Sitting-balance support: Feet unsupported, Bilateral upper extremity supported Sitting balance-Leahy Scale: Poor Sitting balance - Comments: mod assist for trunk control in sitting, kyphotic with posterior lean                  Pertinent Vitals/Pain   Pain Assessment Pain Assessment: 0-10 Pain Score: 3  Pain Location: heels Pain Descriptors / Indicators: Aching, Sore Pain Intervention(s): Limited activity within patient's tolerance, Repositioned, Monitored during session     Home Living Family/patient expects to be discharged to:: Skilled nursing facility             Additional Comments: from SNF at [facility]    Prior Function Level of Independence: Needs assistance Comments: Pt reports having assist for all mobility at bed level, was previously using sliding board to [facility] but unsure how long it has been since she could do that    UE/LE Assessment   UE ROM/Strength/Tone/Coordination: Impaired UE ROM/Strength/Tone/Coordination Deficits: pt able to perform AROM horizontal adduction of the shoulder but could not fully reach across body on LUE, was able to reach on RUE  LE ROM/Strength/Tone/Coordination: Impaired LE ROM/Strength/Tone/Coordination Deficits: knee flexion AAROM limited to grossly 20 degrees, able to achieve 35 degrees with gravity assist EOB, minimal 2-/5 LB movement with assist to lift and reposition legs    Communication   Communication Communication: No apparent difficulties     Cognition  Overall Cognitive Status: Impaired Comments: pt not oriented to date but otherwise of self, time and place     General Comments      Exercises     Assessment/Plan    PT Problem List         PT Visit Diagnosis Muscle weakness (generalized) (M62.81);Other abnormalities of gait and mobility (R26.89)    No Skilled PT Patient at baseline level of functioning;Patient will have necessary level of assist by  caregiver at discharge   Co-evaluation                AMPAC 6 Clicks Help needed turning from your back to your side while in a flat bed without using bedrails?: Total Help needed moving from lying on your back to sitting on the side of a flat bed without using bedrails?: Total Help needed moving to and from a bed to a chair (including a wheelchair)?: Total Help needed standing up from a chair using your arms (e.g., wheelchair or bedside chair)?: Total Help needed to walk in hospital room?: Total Help needed climbing 3-5 steps with a railing? : Total 6 Click Score: 6      End of Session   Activity Tolerance: Patient tolerated treatment well Patient left: in bed;with call bell/phone within reach Nurse Communication: Need for lift equipment;Mobility status PT Visit Diagnosis: Muscle weakness (generalized) (M62.81);Other abnormalities of gait and mobility (R26.89)     Time: 4098-1191 PT Time Calculation (min) (ACUTE ONLY): 23 min  Charges:   PT Evaluation $PT Eval Low Complexity: 1 Low      Dominie Benedick P, PT Acute Rehabilitation Services Office: [phone]   Enedina Finner Brittannie Tawney  03/05/2023, 1:11 PM

## 2023-03-06 ENCOUNTER — Other Ambulatory Visit: Payer: Self-pay

## 2023-03-06 DIAGNOSIS — B9629 Other Escherichia coli [E. coli] as the cause of diseases classified elsewhere: Secondary | ICD-10-CM | POA: Diagnosis not present

## 2023-03-06 DIAGNOSIS — N390 Urinary tract infection, site not specified: Secondary | ICD-10-CM | POA: Diagnosis not present

## 2023-03-06 DIAGNOSIS — D649 Anemia, unspecified: Secondary | ICD-10-CM | POA: Diagnosis not present

## 2023-03-06 DIAGNOSIS — R7881 Bacteremia: Secondary | ICD-10-CM | POA: Diagnosis not present

## 2023-03-06 DIAGNOSIS — R6521 Severe sepsis with septic shock: Secondary | ICD-10-CM | POA: Diagnosis not present

## 2023-03-06 DIAGNOSIS — A419 Sepsis, unspecified organism: Secondary | ICD-10-CM | POA: Diagnosis not present

## 2023-03-06 LAB — CBC
HCT: 27.4 % — ABNORMAL LOW (ref 36.0–46.0)
Hemoglobin: 8.8 g/dL — ABNORMAL LOW (ref 12.0–15.0)
MCH: 28.8 pg (ref 26.0–34.0)
MCHC: 32.1 g/dL (ref 30.0–36.0)
MCV: 89.5 fL (ref 80.0–100.0)
Platelets: 305 10*3/uL (ref 150–400)
RBC: 3.06 MIL/uL — ABNORMAL LOW (ref 3.87–5.11)
RDW: 15.7 % — ABNORMAL HIGH (ref 11.5–15.5)
WBC: 13.1 10*3/uL — ABNORMAL HIGH (ref 4.0–10.5)
nRBC: 0 % (ref 0.0–0.2)

## 2023-03-06 LAB — BASIC METABOLIC PANEL
Anion gap: 10 (ref 5–15)
BUN: 31 mg/dL — ABNORMAL HIGH (ref 8–23)
CO2: 23 mmol/L (ref 22–32)
Calcium: 7.8 mg/dL — ABNORMAL LOW (ref 8.9–10.3)
Chloride: 107 mmol/L (ref 98–111)
Creatinine, Ser: 1.36 mg/dL — ABNORMAL HIGH (ref 0.44–1.00)
GFR, Estimated: 40 mL/min — ABNORMAL LOW (ref >60)
Glucose, Bld: 199 mg/dL — ABNORMAL HIGH (ref 70–99)
Potassium: 4 mmol/L (ref 3.5–5.1)
Sodium: 140 mmol/L (ref 135–145)

## 2023-03-06 LAB — GLUCOSE, CAPILLARY
Glucose-Capillary: 217 mg/dL — ABNORMAL HIGH (ref 70–99)
Glucose-Capillary: 141 mg/dL — ABNORMAL HIGH (ref 70–99)
Glucose-Capillary: 151 mg/dL — ABNORMAL HIGH (ref 70–99)
Glucose-Capillary: 161 mg/dL — ABNORMAL HIGH (ref 70–99)

## 2023-03-06 MED ORDER — SODIUM CHLORIDE 0.9% FLUSH: 10.0000 mL | INTRAVENOUS | Status: DC | PRN

## 2023-03-06 MED ORDER — SODIUM CHLORIDE 0.9% FLUSH
10.0000 mL | Freq: Two times a day (BID) | INTRAVENOUS | Status: DC
  Administered 2023-03-06 – 2023-03-08 (×4): 10 mL

## 2023-03-06 MED ORDER — SODIUM CHLORIDE 0.9 % IV SOLN
1.0000 g | Freq: Every day | INTRAVENOUS | Status: DC
  Administered 2023-03-06 – 2023-03-08 (×3): 1 g via INTRAVENOUS
  Filled 2023-03-06 (×3): qty 1000

## 2023-03-06 NOTE — Consult Note (Signed)
[facility]    Date of Admission:  03/02/2023   Total days of inpatient antibiotics 3        Reason for Consult: Bacteremia    Active Problems:   Septic shock (HCC)   Assessment: 77 YM with history of ESBL E. coli bacteremia secondary to urinary source send about all the mental status admitted with: #ESBL E. coli bacteremia secondary to UTI - Blood urine and blood cultures grew ESBL E. coli - Patient has been meropenem - CT abdomen pelvis showed bladder wall thickening concerning for cystitis. no evidence of pyelonephritis. - Symptoms have improved.  Plan on 10 days of antibiotics to complete with ertapenem.   Recommendations:  -Place midline -Transition to ertapenem to complete 10 days of antibiotics EOT 11/3 ID will sign off  OPAT ORDERS:  Diagnosis: ESBL ECOLI bacteremia 2/2 UTI  Allergies  Allergen Reactions   Alendronate Other (See Comments)    Muscle weakness   Valsartan Nausea And Vomiting     Discharge antibiotics to be given via PICC line:  Per pharmacy protocol ertapenem 1gm per day   Duration: 10 days End Date: 11/3  [facility] Care Per Protocol with Biopatch Use: Home health RN for IV administration and teaching, line care and labs.    Labs weekly while on IV antibiotics: _x_ CBC with differential __ BMP **TWICE WEEKLY ON VANCOMYCIN  _x_ CMP __ CRP __ ESR __ Vancomycin trough TWICE WEEKLY __ CK  __ Please pull PIC at completion of IV antibiotics __ Please leave PIC in place until doctor has seen patient or been notified  Fax weekly labs to [phone]  Clinic Follow Up Appt: 11/11  @ [facility] with Dr. Thedore Mins  Microbiology:   Antibiotics: Merrem 10/25-10/28 Ertapenem 10/29  Cultures: Blood 10/25 2/2 ESBL E. coli 10/25 urine cultures ESBL E. coli Urine  Other   HPI: [name] is a 77 y.o. female with history of heart failure preserved ejection fraction, COPD, diabetes type 2, hypertension,  dysphagia, ESBL E. coli bacteremia with ESBL E. coli and Pseudomonas aeruginosa pyelonephritis presented with altered mental status.  Found to have E. coli bacteremia ESBL secondary to UTI.   Review of Systems: Review of Systems  All other systems reviewed and are negative.   Past Medical History:  Diagnosis Date   Acute pain of left knee    Angina pectoris (HCC) 07/28/2016   With normal coronary arteriography   Carotid stenosis, right 11/18/2018   Cerebral infarction due to stenosis of right carotid artery (HCC) s/p tPA 11/15/2018   CHF (congestive heart failure) (HCC)    Chronic diastolic congestive heart failure (HCC) 07/28/2016   Chronic venous insufficiency 09/27/2021   COPD (chronic obstructive pulmonary disease) (HCC) 01/14/2018   Diabetes mellitus type II, uncontrolled 11/18/2018   Diabetes mellitus without complication (HCC)    Diabetic polyneuropathy associated with type 2 diabetes mellitus (HCC) 11/30/2015   Fall 11/21/2018   Family hx-stroke 11/18/2018   Hyperlipidemia    Hypertension    Hypertensive heart disease with heart failure (HCC) 07/28/2016   Living in nursing home    Lumbar disc narrowing    Onychogryposis of toenail 09/07/2020   Osteoporosis    Overgrown toenails 12/16/2020   Pustules determined by examination 04/11/2018   Spondylosis    lumbosacral   Stroke [facility])     Social History   Tobacco Use   Smoking status: Never    Passive exposure:  Never   Smokeless tobacco: Never  Vaping Use   Vaping status: Never Used  Substance Use Topics   Alcohol use: Not Currently   Drug use: Never    Family History  Problem Relation Age of Onset   Stroke Mother    Hypertension Mother    Diabetes Mother    Heart disease Mother    Prostate cancer Father    Alcohol abuse Father    CAD Sister    CAD Sister    Valvular heart disease Sister    Scheduled Meds:  aspirin EC  81 mg Oral Daily   atorvastatin  80 mg Oral QHS   Chlorhexidine Gluconate Cloth  6 each Topical  Daily   clopidogrel  75 mg Oral Daily   ezetimibe  10 mg Oral Daily   fesoterodine  4 mg Oral Daily   gabapentin  300 mg Oral BID   Gerhardt's butt cream   Topical QID   heparin  5,000 Units Subcutaneous Q8H   insulin aspart  0-15 Units Subcutaneous TID WC   insulin aspart  0-5 Units Subcutaneous QHS   midodrine  10 mg Oral TID WC   pantoprazole  40 mg Oral Q1200   polyethylene glycol  17 g Oral BID   rOPINIRole  1 mg Oral QHS   senna  2 tablet Oral BID   Continuous Infusions:  ertapenem 1 g (03/06/23 1144)   PRN Meds:.acetaminophen, bisacodyl, docusate sodium, Gerhardt's butt cream, ondansetron (ZOFRAN) IV Allergies  Allergen Reactions   Alendronate Other (See Comments)    Muscle weakness   Valsartan Nausea And Vomiting    OBJECTIVE: Blood pressure (!) 117/58, pulse 66, temperature (!) 97.5 F (36.4 C), resp. rate 17, height 5\' 3"  (1.6 m), weight 84.4 kg, SpO2 98%.  Physical Exam Constitutional:      Appearance: Normal appearance.  HENT:     Head: Normocephalic and atraumatic.     Right Ear: Tympanic membrane normal.     Left Ear: Tympanic membrane normal.     Nose: Nose normal.     Mouth/Throat:     Mouth: Mucous membranes are moist.  Eyes:     Extraocular Movements: Extraocular movements intact.     Conjunctiva/sclera: Conjunctivae normal.     Pupils: Pupils are equal, round, and reactive to light.  Cardiovascular:     Rate and Rhythm: Normal rate and regular rhythm.     Heart sounds: No murmur heard.    No friction rub. No gallop.  Pulmonary:     Effort: Pulmonary effort is normal.     Breath sounds: Normal breath sounds.  Abdominal:     General: Abdomen is flat.     Palpations: Abdomen is soft.  Musculoskeletal:        General: Normal range of motion.  Skin:    General: Skin is warm and dry.  Neurological:     General: No focal deficit present.     Mental Status: She is alert.  Psychiatric:        Mood and Affect: Mood normal.     Lab  Results Lab Results  Component Value Date   WBC 13.1 (H) 03/06/2023   HGB 8.8 (L) 03/06/2023   HCT 27.4 (L) 03/06/2023   MCV 89.5 03/06/2023   PLT 305 03/06/2023    Lab Results  Component Value Date   CREATININE 1.36 (H) 03/06/2023   BUN 31 (H) 03/06/2023   NA 140 03/06/2023   K 4.0 03/06/2023   CL  107 03/06/2023   CO2 23 03/06/2023    Lab Results  Component Value Date   ALT 9 03/02/2023   AST 14 (L) 03/02/2023   ALKPHOS 54 03/02/2023   BILITOT 1.0 03/02/2023       Danelle Earthly, MD [facility] 03/06/2023, 1:17 PM I have personally spent 84 minutes involved in face-to-face and non-face-to-face activities for this patient on the day of the visit. Professional time spent includes the following activities: Preparing to see the patient (review of tests), Obtaining and/or reviewing separately obtained history (admission/discharge record), Performing a medically appropriate examination and/or evaluation , Ordering medications/tests/procedures, referring and communicating with other health care professionals, Documenting clinical information in the EMR, Independently interpreting results (not separately reported), Communicating results to the patient/family/caregiver, Counseling and educating the patient/family/caregiver and Care coordination (not separately reported).

## 2023-03-06 NOTE — Telephone Encounter (Signed)
Note: when attempting a call to patient/[facility] facility, patient is currently admitted to [facility] for septic shock due to bacteremia.    Forwarding to PA covering in hospital this week as FYI, in hopes that new onset Afib can be addressed with patient in hospital if appropriate due to difficulty communicating to patient/family and facility.   New onset AF noted on ILR.  Implanted for syncope.

## 2023-03-06 NOTE — Progress Notes (Signed)
Carelink Summary Report / Loop Recorder 

## 2023-03-06 NOTE — [place] FL2 (Signed)
[place] MEDICAID FL2 LEVEL OF CARE FORM     IDENTIFICATION  Patient Name: [name] Birthdate: 1946/05/04 Sex: female Admission Date (Current Location): 03/02/2023  [facility] and [place] Number:  Producer, television/film/video and Address:  [facility] [place], [address], [place], [place] [address]      Provider Number: [id number]  Attending Physician Name and Address:  Osvaldo Shipper, MD  Relative Name and Phone Number:  Nuala Alpha   [phone]    Current Level of Care: Hospital Recommended Level of Care: Skilled Nursing Facility Prior Approval Number:    Date Approved/Denied:   PASRR Number: [id number]  Discharge Plan: SNF    Current Diagnoses: Patient Active Problem List   Diagnosis Date Noted   Septic shock (HCC) 03/02/2023   Acute urinary retention 02/03/2023   Hypokalemia 02/01/2023   Pressure injury of skin 02/01/2023   Hypophosphatemia 02/01/2023   Hypomagnesemia 02/01/2023   Hypocalcemia 02/01/2023   DMII (diabetes mellitus, type 2) (HCC) 02/01/2023   Constipation 02/01/2023   Dysphagia 02/01/2023   Splenic laceration 02/01/2023   Gram-negative bacteremia 02/01/2023   Bedbound 02/01/2023   Pyelonephritis 01/29/2023   Goals of care, counseling/discussion 01/29/2023   Acute pyelonephritis 01/28/2023   Syncope and collapse 11/07/2022   Right 7-8th rib fractures 11/07/2022   Chronic venous insufficiency 09/27/2021   Overgrown toenails 12/16/2020   Onychogryposis of toenail 09/07/2020   Cerebrovascular disease    Spondylosis    Osteoporosis    Lumbar disc narrowing    Living in nursing home    Hypertension    Diabetes mellitus without complication (HCC)    Chronic diastolic CHF (congestive heart failure) (HCC)    Acute pain of left knee    Fall 11/21/2018   Carotid stenosis, right 11/18/2018   Uncontrolled type 2 diabetes mellitus with hypoglycemia, with long-term current use of insulin (HCC) 11/18/2018   Family  hx-stroke 11/18/2018   Cerebral infarction due to stenosis of right carotid artery (HCC) s/p tPA 11/15/2018   Pustules determined by examination 04/11/2018   HLD (hyperlipidemia) 01/14/2018   COPD (chronic obstructive pulmonary disease) (HCC) 01/14/2018   Angina pectoris (HCC) 07/28/2016   Chronic diastolic congestive heart failure (HCC) 07/28/2016   Hypertensive heart disease with heart failure (HCC) 07/28/2016   Diabetic polyneuropathy associated with type 2 diabetes mellitus (HCC) 11/30/2015   Pain due to onychomycosis of toenail of right foot 08/17/2015    Orientation RESPIRATION BLADDER Height & Weight     Self, Time, Situation, Place  Normal Indwelling catheter, Incontinent Weight: 186 lb 1.1 oz (84.4 kg) Height:  5\' 3"  (160 cm)  BEHAVIORAL SYMPTOMS/MOOD NEUROLOGICAL BOWEL NUTRITION STATUS      Incontinent Diet (see discharge summary)  AMBULATORY STATUS COMMUNICATION OF NEEDS Skin   Total Care Verbally Other (Comment) (redness, ecchymosis)                       Personal Care Assistance Level of Assistance  Bathing, Feeding, Dressing, Total care Bathing Assistance: Maximum assistance Feeding assistance: Maximum assistance Dressing Assistance: Maximum assistance Total Care Assistance: Maximum assistance   Functional Limitations Info  Sight, Hearing, Speech Sight Info: Adequate Hearing Info: Impaired Speech Info: Adequate    SPECIAL CARE FACTORS FREQUENCY  OT (By licensed OT)     PT Frequency: Not recommended-custodial care OT Frequency: Not recommended-custodial care            Contractures Contractures Info: Not present    Additional Factors  Info  Code Status, Allergies Code Status Info: DNR Allergies Info: Alendronate, Valsartan           Current Medications (03/06/2023):  This is the current hospital active medication list Current Facility-Administered Medications  Medication Dose Route Frequency Provider Last Rate Last Admin   acetaminophen  (TYLENOL) tablet 650 mg  650 mg Oral Q6H PRN Henry Russel, MD   650 mg at 03/04/23 9604   aspirin EC tablet 81 mg  81 mg Oral Daily Cheri Fowler, MD   81 mg at 03/06/23 0920   atorvastatin (LIPITOR) tablet 80 mg  80 mg Oral QHS Doristine Counter, RPH   80 mg at 03/05/23 2209   bisacodyl (DULCOLAX) suppository 10 mg  10 mg Rectal Daily PRN Osvaldo Shipper, MD       Chlorhexidine Gluconate Cloth 2 % PADS 6 each  6 each Topical Daily Cheri Fowler, MD   6 each at 03/06/23 1416   clopidogrel (PLAVIX) tablet 75 mg  75 mg Oral Daily Cheri Fowler, MD   75 mg at 03/06/23 0920   docusate sodium (COLACE) capsule 100 mg  100 mg Oral BID PRN Lanier Clam, NP   100 mg at 03/03/23 1050   ertapenem (INVANZ) 1 g in sodium chloride 0.9 % 100 mL IVPB  1 g Intravenous Daily Danelle Earthly, MD 200 mL/hr at 03/06/23 1144 1 g at 03/06/23 1144   ezetimibe (ZETIA) tablet 10 mg  10 mg Oral Daily Cheri Fowler, MD   10 mg at 03/06/23 0920   fesoterodine (TOVIAZ) tablet 4 mg  4 mg Oral Daily Cheri Fowler, MD   4 mg at 03/06/23 5409   gabapentin (NEURONTIN) capsule 300 mg  300 mg Oral BID Cheri Fowler, MD   300 mg at 03/06/23 0920   Gerhardt's butt cream   Topical Daily PRN Cheri Fowler, MD   Given at 03/04/23 8119   Gerhardt's butt cream   Topical QID Cheri Fowler, MD   Given at 03/06/23 1416   heparin injection 5,000 Units  5,000 Units Subcutaneous Q8H Bowser, Kaylyn Layer, NP   5,000 Units at 03/06/23 1416   insulin aspart (novoLOG) injection 0-15 Units  0-15 Units Subcutaneous TID WC Calton Dach I, RPH   2 Units at 03/06/23 1144   insulin aspart (novoLOG) injection 0-5 Units  0-5 Units Subcutaneous QHS Calton Dach I, [facility]   2 Units at 03/05/23 2216   midodrine (PROAMATINE) tablet 10 mg  10 mg Oral TID WC Osvaldo Shipper, MD   10 mg at 03/06/23 1151   ondansetron (ZOFRAN) injection 4 mg  4 mg Intravenous Q6H PRN Bowser, Kaylyn Layer, NP       pantoprazole (PROTONIX) EC tablet 40 mg  40 mg Oral Q1200 Chand,  Garnet Sierras, MD   40 mg at 03/06/23 1145   polyethylene glycol (MIRALAX / GLYCOLAX) packet 17 g  17 g Oral BID Osvaldo Shipper, MD       rOPINIRole (REQUIP) tablet 1 mg  1 mg Oral QHS Cheri Fowler, MD   1 mg at 03/05/23 2210   senna (SENOKOT) tablet 17.2 mg  2 tablet Oral BID Cheri Fowler, MD   17.2 mg at 03/03/23 2105     Discharge Medications: Please see discharge summary for a list of discharge medications.  Relevant Imaging Results:  Relevant Lab Results:   Additional Information SSN: [id number]  Dossie Der Einar Crow, LCSW

## 2023-03-06 NOTE — Progress Notes (Addendum)
[facility] PROGRESS NOTE   [name] FAO:[id number] DOB: 08-07-1945 DOA: 03/02/2023  PCP: Paulina Fusi, MD  Brief History: 77 yo F PMH DNR/I status, HFpEF, COPD, DM2, HTN, Dysphasia, recent ESBL ecoli bacteremia + pyelo with ESBL e coli and PsA (9/23-10/2/24) during which time she required ICU stay and pressor support + mero for abx coverage, who presented to ED 10/25 from [facility] w AMS.  She was noted to be hypotensive.  Received IV fluids and then started on pressors.  She was started on broad-spectrum antibiotics.  Admitted to the ICU.  Consultants: Critical care medicine.  Procedures: Foley catheter placement in the ED    Subjective/Interval History: Patient lying on the bed asleep.  Wakes up easily.  Denies any complaints.    Assessment/Plan:  Severe sepsis with septic shock secondary to recurrent pyelonephritis and ESBL E. coli bacteremia Blood cultures positive for ESBL E. coli.  UA also consistent with infection CT of the abdomen pelvis showed urinary bladder wall thickening with mild bilateral hydroureteronephrosis thought to be secondary to cystitis.  Other findings include significant constipation with fecal retention.  No abscess was noted. Sepsis physiology has improved.  WBC is also getting better. Blood pressure has stabilized.  Continue midodrine for now. Of note she was admitted for similar issue last month and was discharged on 10/2.  At that time she received a 10-day course of antibiotics.  May need a longer course this time around. Will request ID to give antibiotic recommendations..  Acute toxic encephalopathy Mentation has significantly improved. Continue with dysphagia 3 diet.  Will involve SLP if she has issues with coughing or choking episodes with eating and drinking.  She was last seen by SLP during recent hospitalization.    New onset atrial fibrillation Informed by cardiology that her loop recorder showed evidence for atrial  fibrillation.  EKG from 10/26 actually shows atrial fibrillation. Echocardiogram back in July showed normal left ventricular systolic function.  TSH is normal back in September. Heart rate is well-controlled here in the hospital. Rhythm strips from 10/27 suggests sinus rhythm.  Atrial fibrillation could have been present in the setting of sepsis.  Will repeat EKG.  If sinus rhythm is being maintained then this can be discussed further with cardiology in the outpatient setting.  Hold off on anticoagulation for now. EKG from this morning shows sinus rhythm.  I believe her atrial fibrillation was triggered by severe sepsis.  Do not think that anticoagulation is warranted in this situation especially in this patient who has poor functional status to begin with.  This can be further addressed in the outpatient setting.  Would recommend follow-up with cardiology.  Acute kidney injury on chronic kidney disease stage IIIa She had a creatinine of 0.74 on October 2.  Came in with creatinine of 1.59 which increased to 1.66 and then noted to be improving.  Renal function is back to baseline.  Constipation/fecal impaction She was disimpacted in the ICU.  Placed on aggressive bowel regimen.  Had multiple bowel movements in the ICU.   TSH was normal in September.  Chronic diastolic CHF Echocardiogram from July 2024 showed grade 2 diastolic dysfunction with normal systolic function.  If at all she is hypovolemic.  Monitor volume status closely. Noted to be on aspirin Plavix ezetimibe statin.    Normocytic anemia Drop in hemoglobin is dilutional.  No evidence of overt bleeding.  Diabetes mellitus type 2 with peripheral neuropathy SSI.  CBGs.  Goals of care  ICU team discussed with patient's spouse.  He confirmed DNR and DNI status.  Okay with ICU.  Peripheral pressors were okay.  No central line or art line.  Obesity Estimated body mass index is 32.96 kg/m as calculated from the following:   Height as  of this encounter: 5\' 3"  (1.6 m).   Weight as of this encounter: 84.4 kg.  DVT Prophylaxis: Subcutaneous heparin Code Status: DNR and DNI Family Communication: No family at bedside Disposition Plan: SNF when medically stable.  Family requesting different SNF.  TOC will be consulted.  Status is: Inpatient Remains inpatient appropriate because: IV antibiotics      Medications: Scheduled:  aspirin EC  81 mg Oral Daily   atorvastatin  80 mg Oral QHS   Chlorhexidine Gluconate Cloth  6 each Topical Daily   clopidogrel  75 mg Oral Daily   ezetimibe  10 mg Oral Daily   fesoterodine  4 mg Oral Daily   gabapentin  300 mg Oral BID   Gerhardt's butt cream   Topical QID   heparin  5,000 Units Subcutaneous Q8H   insulin aspart  0-15 Units Subcutaneous TID WC   insulin aspart  0-5 Units Subcutaneous QHS   midodrine  10 mg Oral TID WC   pantoprazole  40 mg Oral Q1200   polyethylene glycol  17 g Oral BID   rOPINIRole  1 mg Oral QHS   senna  2 tablet Oral BID   Continuous:  meropenem (MERREM) IV 2 g (03/06/23 0034)   WUJ:[id number], bisacodyl, docusate sodium, Gerhardt's butt cream, ondansetron (ZOFRAN) IV  Antibiotics: Anti-infectives (From admission, onward)    Start     Dose/Rate Route Frequency Ordered Stop   03/05/23 0000  meropenem (MERREM) 2 g in sodium chloride 0.9 % 100 mL IVPB        2 g 280 mL/hr over 30 Minutes Intravenous Every 12 hours 03/04/23 1221     03/04/23 1245  meropenem (MERREM) 1 g in sodium chloride 0.9 % 100 mL IVPB        1 g 200 mL/hr over 30 Minutes Intravenous  Once 03/04/23 1221 03/04/23 1355   03/03/23 2200  meropenem (MERREM) 1 g in sodium chloride 0.9 % 100 mL IVPB  Status:  Discontinued        1 g 200 mL/hr over 30 Minutes Intravenous Every 12 hours 03/03/23 1307 03/04/23 1221   03/02/23 2121  vancomycin variable dose per unstable renal function (pharmacist dosing)  Status:  Discontinued         Does not apply See admin instructions 03/02/23 2121  03/03/23 0850   03/02/23 1545  meropenem (MERREM) 2 g in sodium chloride 0.9 % 100 mL IVPB  Status:  Discontinued        2 g 280 mL/hr over 30 Minutes Intravenous Every 12 hours 03/02/23 1540 03/03/23 1307   03/02/23 1030  ceFEPIme (MAXIPIME) 2 g in sodium chloride 0.9 % 100 mL IVPB        2 g 200 mL/hr over 30 Minutes Intravenous  Once 03/02/23 1016 03/02/23 1124   03/02/23 1030  metroNIDAZOLE (FLAGYL) IVPB 500 mg        500 mg 100 mL/hr over 60 Minutes Intravenous  Once 03/02/23 1016 03/02/23 1158   03/02/23 1030  vancomycin (VANCOCIN) IVPB 1000 mg/200 mL premix  Status:  Discontinued        1,000 mg 200 mL/hr over 60 Minutes Intravenous  Once 03/02/23 1016 03/02/23 1017   03/02/23  1030  vancomycin (VANCOREADY) IVPB 1500 mg/300 mL        1,500 mg 150 mL/hr over 120 Minutes Intravenous  Once 03/02/23 1017 03/02/23 1424       Objective:  Vital Signs  Vitals:   03/05/23 1245 03/05/23 1725 03/05/23 2051 03/06/23 0508  BP: 128/64 (!) 110/42 (!) 105/50 (!) 107/55  Pulse: 70 71 67 64  Resp: 14 20 18 18   Temp:  97.9 F (36.6 C) 98.8 F (37.1 C) 97.6 F (36.4 C)  TempSrc:  Oral    SpO2: 100% 100% 99% 96%  Weight:      Height:        Intake/Output Summary (Last 24 hours) at 03/06/2023 0851 Last data filed at 03/06/2023 0500 Gross per 24 hour  Intake 120 ml  Output 725 ml  Net -605 ml   Filed Weights   03/03/23 0700 03/04/23 0205 03/05/23 0236  Weight: 84.2 kg 84.4 kg 84.4 kg   General appearance: Awake alert.  In no distress.  Distracted Resp: Clear to auscultation bilaterally.  Normal effort Cardio: S1-S2 is normal regular.  No S3-S4.  No rubs murmurs or bruit GI: Abdomen is soft.  Nontender nondistended.  Bowel sounds are present normal.  No masses organomegaly    Lab Results:  Data Reviewed: I have personally reviewed following labs and reports of the imaging studies  CBC: Recent Labs  Lab 03/02/23 1157 03/02/23 1728 03/03/23 0922 03/04/23 0340  03/05/23 0437 03/06/23 0439  WBC 27.5* 32.0* 43.0* 21.7* 16.5* 13.1*  NEUTROABS 23.7*  --   --   --   --   --   HGB 8.9* 10.8* 9.6* 8.1* 8.1* 8.8*  HCT 28.2* 34.0* 30.0* 24.8* 24.7* 27.4*  MCV 89.5 89.2 88.2 86.7 88.8 89.5  PLT 334 406* 366 286 296 305    Basic Metabolic Panel: Recent Labs  Lab 03/02/23 1157 03/02/23 1728 03/03/23 0922 03/03/23 2134 03/04/23 0340 03/05/23 0437 03/06/23 0439  NA 140  --  140 137  --  140 140  K 3.4*  --  3.5 3.5  --  3.5 4.0  CL 103  --  104 103  --  105 107  CO2 24  --  22 19*  --  25 23  GLUCOSE 85  --  158* 143*  --  135* 199*  BUN 32*  --  34* 37*  --  34* 31*  CREATININE 1.59* 1.68* 1.66* 1.32*  --  1.23* 1.36*  CALCIUM 7.8*  --  7.8* 7.6*  --  7.9* 7.8*  MG  --   --  1.7  --  1.8  --   --   PHOS  --   --  4.2  --  3.3  --   --     GFR: Estimated Creatinine Clearance: 36.2 mL/min (A) (by C-G formula based on SCr of 1.36 mg/dL (H)).  Liver Function Tests: Recent Labs  Lab 03/02/23 1157  AST 14*  ALT 9  ALKPHOS 54  BILITOT 1.0  PROT 5.8*  ALBUMIN 2.0*    Coagulation Profile: Recent Labs  Lab 03/02/23 1157  INR 1.3*   CBG: Recent Labs  Lab 03/05/23 0736 03/05/23 1142 03/05/23 1615 03/05/23 2055 03/06/23 0719  GLUCAP 153* 132* 185* 220* 217*     Recent Results (from the past 240 hour(s))  Urine Culture     Status: Abnormal   Collection Time: 03/02/23 10:15 AM   Specimen: Urine, Random  Result Value Ref Range Status  Specimen Description URINE, RANDOM  Final   Special Requests   Final    NONE Reflexed from [phone] Performed at [facility], [address] [place], [place] [address]    Culture (A)  Final    >=100,000 COLONIES/mL ESCHERICHIA COLI Confirmed Extended Spectrum Beta-Lactamase Producer (ESBL).  In bloodstream infections from ESBL organisms, carbapenems are preferred over piperacillin/tazobactam. They are shown to have a lower risk of mortality.    Report Status 03/04/2023 FINAL  Final    Organism ID, Bacteria ESCHERICHIA COLI (A)  Final      Susceptibility   Escherichia coli - MIC*    AMPICILLIN >=32 RESISTANT Resistant     CEFAZOLIN >=64 RESISTANT Resistant     CEFEPIME >=32 RESISTANT Resistant     CEFTRIAXONE >=64 RESISTANT Resistant     CIPROFLOXACIN >=4 RESISTANT Resistant     GENTAMICIN >=16 RESISTANT Resistant     IMIPENEM <=0.25 SENSITIVE Sensitive     NITROFURANTOIN <=16 SENSITIVE Sensitive     TRIMETH/SULFA >=320 RESISTANT Resistant     AMPICILLIN/SULBACTAM >=32 RESISTANT Resistant     PIP/TAZO 16 SENSITIVE Sensitive ug/mL    * >=100,000 COLONIES/mL ESCHERICHIA COLI  Resp panel by RT-PCR (RSV, Flu A&B, Covid) Peripheral     Status: None   Collection Time: 03/02/23 10:47 AM   Specimen: Peripheral; Nasal Swab  Result Value Ref Range Status   SARS Coronavirus 2 by RT PCR NEGATIVE NEGATIVE Final   Influenza A by PCR NEGATIVE NEGATIVE Final   Influenza B by PCR NEGATIVE NEGATIVE Final    Comment: (NOTE) The Xpert Xpress SARS-CoV-2/FLU/RSV plus assay is intended as an aid in the diagnosis of influenza from Nasopharyngeal swab specimens and should not be used as a sole basis for treatment. Nasal washings and aspirates are unacceptable for Xpert Xpress SARS-CoV-2/FLU/RSV testing.  Fact Sheet for Patients: BloggerCourse.com  Fact Sheet for Healthcare Providers: SeriousBroker.it  This test is not yet approved or cleared by the [place] FDA and has been authorized for detection and/or diagnosis of SARS-CoV-2 by FDA under an Emergency Use Authorization (EUA). This EUA will remain in effect (meaning this test can be used) for the duration of the COVID-19 declaration under Section 564(b)(1) of the Act, 21 U.S.C. section 360bbb-3(b)(1), unless the authorization is terminated or revoked.     Resp Syncytial Virus by PCR NEGATIVE NEGATIVE Final    Comment: (NOTE) Fact Sheet for  Patients: BloggerCourse.com  Fact Sheet for Healthcare Providers: SeriousBroker.it  This test is not yet approved or cleared by the [place] FDA and has been authorized for detection and/or diagnosis of SARS-CoV-2 by FDA under an Emergency Use Authorization (EUA). This EUA will remain in effect (meaning this test can be used) for the duration of the COVID-19 declaration under Section 564(b)(1) of the Act, 21 U.S.C. section 360bbb-3(b)(1), unless the authorization is terminated or revoked.  Performed at [facility], [address] [place], [place] [address]   Blood Culture (routine x 2)     Status: None (Preliminary result)   Collection Time: 03/02/23 10:47 AM   Specimen: BLOOD  Result Value Ref Range Status   Specimen Description BLOOD BLOOD RIGHT ARM  Final   Special Requests   Final    BOTTLES DRAWN AEROBIC AND ANAEROBIC Blood Culture results may not be optimal due to an inadequate volume of blood received in culture bottles   Culture  Setup Time   Final    GRAM NEGATIVE RODS AEROBIC BOTTLE ONLY  CRITICAL VALUE NOTED.  VALUE IS CONSISTENT WITH PREVIOUSLY REPORTED AND CALLED VALUE.    Culture   Final    GRAM NEGATIVE RODS IDENTIFICATION TO FOLLOW Performed at [facility], [address] [place], [place] [address]    Report Status PENDING  Incomplete  Blood Culture (routine x 2)     Status: Abnormal   Collection Time: 03/02/23 10:47 AM   Specimen: BLOOD  Result Value Ref Range Status   Specimen Description BLOOD BLOOD LEFT ARM  Final   Special Requests   Final    BOTTLES DRAWN AEROBIC AND ANAEROBIC Blood Culture adequate volume   Culture  Setup Time   Final    IN BOTH AEROBIC AND ANAEROBIC BOTTLES GRAM NEGATIVE RODS CRITICAL RESULT CALLED TO, READ BACK BY AND VERIFIED WITH: V BRYK,PHARMD@0645  03/03/23 MK Performed at [facility], [address], [place], [place] [address]    Culture (A)  Final     ESCHERICHIA COLI Confirmed Extended Spectrum Beta-Lactamase Producer (ESBL).  In bloodstream infections from ESBL organisms, carbapenems are preferred over piperacillin/tazobactam. They are shown to have a lower risk of mortality.    Report Status 03/05/2023 FINAL  Final   Organism ID, Bacteria ESCHERICHIA COLI  Final   Organism ID, Bacteria ESCHERICHIA COLI  Final      Susceptibility   Escherichia coli - KIRBY BAUER*    CEFAZOLIN RESISTANT Resistant    Escherichia coli - MIC*    AMPICILLIN >=32 RESISTANT Resistant     CEFEPIME >=32 RESISTANT Resistant     CEFTAZIDIME RESISTANT Resistant     CEFTRIAXONE >=64 RESISTANT Resistant     CIPROFLOXACIN >=4 RESISTANT Resistant     GENTAMICIN >=16 RESISTANT Resistant     IMIPENEM <=0.25 SENSITIVE Sensitive     TRIMETH/SULFA >=320 RESISTANT Resistant     AMPICILLIN/SULBACTAM >=32 RESISTANT Resistant     PIP/TAZO 16 SENSITIVE Sensitive ug/mL    * ESCHERICHIA COLI    ESCHERICHIA COLI  Blood Culture ID Panel (Reflexed)     Status: Abnormal   Collection Time: 03/02/23 10:47 AM  Result Value Ref Range Status   Enterococcus faecalis NOT DETECTED NOT DETECTED Final   Enterococcus Faecium NOT DETECTED NOT DETECTED Final   Listeria monocytogenes NOT DETECTED NOT DETECTED Final   Staphylococcus species NOT DETECTED NOT DETECTED Final   Staphylococcus aureus (BCID) NOT DETECTED NOT DETECTED Final   Staphylococcus epidermidis NOT DETECTED NOT DETECTED Final   Staphylococcus lugdunensis NOT DETECTED NOT DETECTED Final   Streptococcus species NOT DETECTED NOT DETECTED Final   Streptococcus agalactiae NOT DETECTED NOT DETECTED Final   Streptococcus pneumoniae NOT DETECTED NOT DETECTED Final   Streptococcus pyogenes NOT DETECTED NOT DETECTED Final   A.calcoaceticus-baumannii NOT DETECTED NOT DETECTED Final   Bacteroides fragilis NOT DETECTED NOT DETECTED Final   Enterobacterales DETECTED (A) NOT DETECTED Final    Comment: Enterobacterales represent  a large order of gram negative bacteria, not a single organism. CRITICAL RESULT CALLED TO, READ BACK BY AND VERIFIED WITH: V BRYK,PHARMD@0645  03/03/23 MK    Enterobacter cloacae complex NOT DETECTED NOT DETECTED Final   Escherichia coli DETECTED (A) NOT DETECTED Final    Comment: CRITICAL RESULT CALLED TO, READ BACK BY AND VERIFIED WITH: V BRYK,PHARMD@0645  03/03/23 MK    Klebsiella aerogenes NOT DETECTED NOT DETECTED Final   Klebsiella oxytoca NOT DETECTED NOT DETECTED Final   Klebsiella pneumoniae NOT DETECTED NOT DETECTED Final   Proteus species NOT DETECTED NOT DETECTED Final   Salmonella  species NOT DETECTED NOT DETECTED Final   Serratia marcescens NOT DETECTED NOT DETECTED Final   Haemophilus influenzae NOT DETECTED NOT DETECTED Final   Neisseria meningitidis NOT DETECTED NOT DETECTED Final   Pseudomonas aeruginosa NOT DETECTED NOT DETECTED Final   Stenotrophomonas maltophilia NOT DETECTED NOT DETECTED Final   Candida albicans NOT DETECTED NOT DETECTED Final   Candida auris NOT DETECTED NOT DETECTED Final   Candida glabrata NOT DETECTED NOT DETECTED Final   Candida krusei NOT DETECTED NOT DETECTED Final   Candida parapsilosis NOT DETECTED NOT DETECTED Final   Candida tropicalis NOT DETECTED NOT DETECTED Final   Cryptococcus neoformans/gattii NOT DETECTED NOT DETECTED Final   CTX-M ESBL DETECTED (A) NOT DETECTED Final    Comment: CRITICAL RESULT CALLED TO, READ BACK BY AND VERIFIED WITH: V BRYK,PHARMD@0645  03/03/23 MK (NOTE) Extended spectrum beta-lactamase detected. Recommend a carbapenem as initial therapy.      Carbapenem resistance IMP NOT DETECTED NOT DETECTED Final   Carbapenem resistance KPC NOT DETECTED NOT DETECTED Final   Carbapenem resistance NDM NOT DETECTED NOT DETECTED Final   Carbapenem resist OXA 48 LIKE NOT DETECTED NOT DETECTED Final   Carbapenem resistance VIM NOT DETECTED NOT DETECTED Final    Comment: Performed at [facility], [address] [place], [place] [address]  MRSA Next Gen by PCR, Nasal     Status: None   Collection Time: 03/02/23  6:48 PM   Specimen: Nasal Mucosa; Nasal Swab  Result Value Ref Range Status   MRSA by PCR Next Gen NOT DETECTED NOT DETECTED Final    Comment: (NOTE) The GeneXpert MRSA Assay (FDA approved for NASAL specimens only), is one component of a comprehensive MRSA colonization surveillance program. It is not intended to diagnose MRSA infection nor to guide or monitor treatment for MRSA infections. Test performance is not FDA approved in patients less than 49 years old. Performed at [facility], [address], [place], [place] [address]       Radiology Studies: No results found.     LOS: 4 days   Emree Locicero [employer] on [email or web address]  03/06/2023, 8:51 AM

## 2023-03-06 NOTE — Progress Notes (Signed)
PHARMACY CONSULT NOTE FOR:  OUTPATIENT  PARENTERAL ANTIBIOTIC THERAPY (OPAT)  Indication: ESBL E. Coli Bacteremia Regimen: Ertapenem 1 g every 24 hours  End date: 03/11/23  IV antibiotic discharge orders are pended. To discharging provider:  please sign these orders via discharge navigator,  Select New Orders & click on the button choice - Manage This Unsigned Work.     Thank you for allowing pharmacy to be a part of this patient's care.  Griffin Dakin 03/06/2023, 10:33 AM

## 2023-03-06 NOTE — Evaluation (Signed)
Occupational Therapy Evaluation Patient Details Name: [name] MRN: [id number] DOB: [birth year] Today's Date: 03/06/2023   History of Present Illness 77 yo female admitted 10/25 from [facility] with AMS, hypotension, sepsis and recurrent pyelonephritis. PMhx: Recent admission for UTI 9/22-10/2. Chronic HFpEF, COPD, DM, ESBL E. coli bacteremia due to acute pyelonephritis   Clinical Impression   Patient admitted for the diagnosis above.  Patient very close to baseline status given level of assist she receives at her SNF.  Patient is participating with self feeding and grooming after setup.  Recommend return to facility once medically ready per MD.         If plan is discharge home, recommend the following: A lot of help with bathing/dressing/bathroom    Functional Status Assessment  Patient has not had a recent decline in their functional status  Equipment Recommendations  None recommended by OT    Recommendations for Other Services       Precautions / Restrictions Precautions Precautions: Fall Restrictions Weight Bearing Restrictions: No      Mobility Bed Mobility     Rolling: Max assist, Total assist              Transfers                          Balance                                           ADL either performed or assessed with clinical judgement   ADL Overall ADL's : At baseline                                             Vision Patient Visual Report: No change from baseline       Perception Perception: Not tested       Praxis Praxis: Not tested       Pertinent Vitals/Pain Pain Assessment Pain Assessment: No/denies pain Pain Intervention(s): Monitored during session     Extremity/Trunk Assessment Upper Extremity Assessment Upper Extremity Assessment: Generalized weakness;Right hand dominant   Lower Extremity Assessment Lower Extremity Assessment: Defer to PT  evaluation   Cervical / Trunk Assessment Cervical / Trunk Assessment: Kyphotic   Communication Communication Communication: No apparent difficulties   Cognition Arousal: Alert Behavior During Therapy: WFL for tasks assessed/performed Overall Cognitive Status: History of cognitive impairments - at baseline                                       General Comments   VSS on RA    Exercises     Shoulder Instructions      Home Living Family/patient expects to be discharged to:: Skilled nursing facility                                 Additional Comments: from SNF at [facility]      Prior Functioning/Environment Prior Level of Function : Needs assist             Mobility Comments: Per Pt eval, confirmed via  patient: pt has assist from staff to get in/out of bed, they assist with stand pivot transfers bed<>WC using RW. pt requires assist for sit<>stand with RW for toileting (change brief). Pt reports she can propel self in WC with bil UE/LE. ADLs Comments: Per Pt eval, confirmed via patient: pt reports staff at facility provide sponge bath in bed, she wears brief for toileting and staff assists with changing in standing (or bed) and provide pericare. pt requires assist for dressing every day.        OT Problem List: Decreased range of motion      OT Treatment/Interventions:      OT Goals(Current goals can be found in the care plan section) Acute Rehab OT Goals OT Goal Formulation: Patient unable to participate in goal setting Time For Goal Achievement: 03/12/23 Potential to Achieve Goals: Good  OT Frequency:      Co-evaluation              AM-PAC OT "6 Clicks" Daily Activity     Outcome Measure Help from another person eating meals?: A Little Help from another person taking care of personal grooming?: A Little Help from another person toileting, which includes using toliet, bedpan, or urinal?: Total Help from another  person bathing (including washing, rinsing, drying)?: A Lot Help from another person to put on and taking off regular upper body clothing?: A Lot Help from another person to put on and taking off regular lower body clothing?: Total 6 Click Score: 12   End of Session Nurse Communication: Other (comment)  Activity Tolerance: Patient tolerated treatment well Patient left: in bed;with call bell/phone within reach  OT Visit Diagnosis: Muscle weakness (generalized) (M62.81)                Time: 2956-2130 OT Time Calculation (min): 14 min Charges:  OT General Charges $OT Visit: 1 Visit OT Evaluation $OT Eval Moderate Complexity: 1 Mod  03/06/2023  RP, OTR/L  Acute Rehabilitation Services  Office:  [phone]   Suzanna Obey 03/06/2023, 1:02 PM

## 2023-03-06 NOTE — TOC Initial Note (Signed)
Transition of Care [facility]) - Initial/Assessment Note    Patient Details  Name: [name] MRN: [id number] Date of Birth: Jan 22, 1946  Transition [facility]) CM/SW Contact:    Lorri Frederick, LCSW Phone Number: 03/06/2023, 2:53 PM  Clinical Narrative:  CSW met with  pt regarding DC plan.  Pt confirms that she has been in STR at [place] but that her family is attempting to get her placed at a SNF in [place], closer to where they live, possibly [facility].  She asked CSW to speak with her nephew Cyndia Bent.  She reports her husband is very hard of hearing, her son JT works 3rd shift and is in bed.  Permission given to speak with them.  1430: CSW  spoke with pt, nephew Brett Canales, and husband Lorella Nimrod in room.  Brett Canales confirms the above info.  Also shares that pt has been at [facility] for nearly all of her 100 day SNF benefit.  Pt has been using tricare coverage for copays.  Sheliah Hatch has been working on transfer to [facility] or possibly Naval architect in [place].  The family is prepared to private pay for that care.    Referral sent to [facility] and to [facility].CSW spoke with Jomarie Longs at [facility] and he will review the referral and reach out to [facility].  He has already been in contact with them and is OK with private pay.  CSW spoke with [facility].  She does not have available beds in [place].               Expected Discharge Plan: Long Term Nursing Home Barriers to Discharge: Continued Medical Work up   Patient Goals and CMS Choice Patient states their goals for this hospitalization and ongoing recovery are:: get closer to home CMS Medicare.gov Compare Post Acute Care list provided to:: Patient Choice offered to / list presented to : Patient, Spouse (nephew Brett Canales)      Expected Discharge Plan and Services In-house Referral: Clinical Social Work   Post Acute Care Choice: Skilled Nursing Facility Living arrangements for the past 2 months: Skilled Chartered loss adjuster)                                       Prior Living Arrangements/Services Living arrangements for the past 2 months: Skilled Nursing Facility Risk analyst) Lives with:: Facility Resident Patient language and need for interpreter reviewed:: Yes Do you feel safe going back to the place where you live?: Yes      Need for Family Participation in Patient Care: Yes (Comment) Care giver support system in place?: Yes (comment) Current home services: Other (comment) (na) Criminal Activity/Legal Involvement Pertinent to Current Situation/Hospitalization: No - Comment as needed  Activities of Daily Living   ADL Screening (condition at time of admission) Independently performs ADLs?: No Does the patient have a NEW difficulty with bathing/dressing/toileting/self-feeding that is expected to last >3 days?: No Does the patient have a NEW difficulty with getting in/out of bed, walking, or climbing stairs that is expected to last >3 days?: No Does the patient have a NEW difficulty with communication that is expected to last >3 days?: No Is the patient deaf or have difficulty hearing?: No Does the patient have difficulty seeing, even when wearing glasses/contacts?: No Does the patient have difficulty concentrating, remembering, or making decisions?: Yes  Permission Sought/Granted Permission sought to share information with : Family Supports Permission granted to share information  with : Yes, Verbal Permission Granted  Share Information with NAME: husband Lorella Nimrod, nephew Brett Canales, son JT  Permission granted to share info w AGENCY: SNF        Emotional Assessment Appearance:: Appears stated age Attitude/Demeanor/Rapport: Engaged Affect (typically observed): Appropriate, Pleasant Orientation: : Oriented to Self, Oriented to Place, Oriented to  Time, Oriented to Situation      Admission diagnosis:  Septic shock (HCC) [A41.9, R65.21] Hypotension, unspecified hypotension type [I95.9] Urinary tract infection with hematuria, site unspecified  [N39.0, R31.9] Patient Active Problem List   Diagnosis Date Noted   Septic shock (HCC) 03/02/2023   Acute urinary retention 02/03/2023   Hypokalemia 02/01/2023   Pressure injury of skin 02/01/2023   Hypophosphatemia 02/01/2023   Hypomagnesemia 02/01/2023   Hypocalcemia 02/01/2023   DMII (diabetes mellitus, type 2) (HCC) 02/01/2023   Constipation 02/01/2023   Dysphagia 02/01/2023   Splenic laceration 02/01/2023   Gram-negative bacteremia 02/01/2023   Bedbound 02/01/2023   Pyelonephritis 01/29/2023   Goals of care, counseling/discussion 01/29/2023   Acute pyelonephritis 01/28/2023   Syncope and collapse 11/07/2022   Right 7-8th rib fractures 11/07/2022   Chronic venous insufficiency 09/27/2021   Overgrown toenails 12/16/2020   Onychogryposis of toenail 09/07/2020   Cerebrovascular disease    Spondylosis    Osteoporosis    Lumbar disc narrowing    Living in nursing home    Hypertension    Diabetes mellitus without complication (HCC)    Chronic diastolic CHF (congestive heart failure) (HCC)    Acute pain of left knee    Fall 11/21/2018   Carotid stenosis, right 11/18/2018   Uncontrolled type 2 diabetes mellitus with hypoglycemia, with long-term current use of insulin (HCC) 11/18/2018   Family hx-stroke 11/18/2018   Cerebral infarction due to stenosis of right carotid artery (HCC) s/p tPA 11/15/2018   Pustules determined by examination 04/11/2018   HLD (hyperlipidemia) 01/14/2018   COPD (chronic obstructive pulmonary disease) (HCC) 01/14/2018   Angina pectoris (HCC) 07/28/2016   Chronic diastolic congestive heart failure (HCC) 07/28/2016   Hypertensive heart disease with heart failure (HCC) 07/28/2016   Diabetic polyneuropathy associated with type 2 diabetes mellitus (HCC) 11/30/2015   Pain due to onychomycosis of toenail of right foot 08/17/2015   PCP:  Paulina Fusi, MD Pharmacy:  No Pharmacies Listed    Social Determinants of Health (SDOH) Social History: SDOH  Screenings   Food Insecurity: No Food Insecurity (03/05/2023)  Housing: Low Risk  (03/05/2023)  Transportation Needs: No Transportation Needs (03/05/2023)  Utilities: Not At Risk (03/05/2023)  Alcohol Screen: Low Risk  (12/25/2018)  Depression (PHQ2-9): Low Risk  (12/25/2018)  Financial Resource Strain: Low Risk  (12/20/2018)  Physical Activity: Inactive (12/20/2018)  Social Connections: Moderately Integrated (12/25/2018)  Stress: Stress Concern Present (12/20/2018)  Tobacco Use: Low Risk  (03/02/2023)   SDOH Interventions:     Readmission Risk Interventions     No data to display

## 2023-03-07 DIAGNOSIS — A419 Sepsis, unspecified organism: Secondary | ICD-10-CM | POA: Diagnosis not present

## 2023-03-07 DIAGNOSIS — R6521 Severe sepsis with septic shock: Secondary | ICD-10-CM | POA: Diagnosis not present

## 2023-03-07 LAB — CBC
HCT: 26.9 % — ABNORMAL LOW (ref 36.0–46.0)
Hemoglobin: 8.3 g/dL — ABNORMAL LOW (ref 12.0–15.0)
MCH: 27.7 pg (ref 26.0–34.0)
MCHC: 30.9 g/dL (ref 30.0–36.0)
MCV: 89.7 fL (ref 80.0–100.0)
Platelets: 291 10*3/uL (ref 150–400)
RBC: 3 MIL/uL — ABNORMAL LOW (ref 3.87–5.11)
RDW: 15.6 % — ABNORMAL HIGH (ref 11.5–15.5)
WBC: 13.1 10*3/uL — ABNORMAL HIGH (ref 4.0–10.5)
nRBC: 0 % (ref 0.0–0.2)

## 2023-03-07 LAB — BASIC METABOLIC PANEL
Anion gap: 7 (ref 5–15)
BUN: 25 mg/dL — ABNORMAL HIGH (ref 8–23)
CO2: 26 mmol/L (ref 22–32)
Calcium: 7.6 mg/dL — ABNORMAL LOW (ref 8.9–10.3)
Chloride: 108 mmol/L (ref 98–111)
Creatinine, Ser: 1.27 mg/dL — ABNORMAL HIGH (ref 0.44–1.00)
GFR, Estimated: 44 mL/min — ABNORMAL LOW (ref >60)
Glucose, Bld: 192 mg/dL — ABNORMAL HIGH (ref 70–99)
Potassium: 3.9 mmol/L (ref 3.5–5.1)
Sodium: 141 mmol/L (ref 135–145)

## 2023-03-07 LAB — CULTURE, BLOOD (ROUTINE X 2)

## 2023-03-07 LAB — GLUCOSE, CAPILLARY
Glucose-Capillary: 191 mg/dL — ABNORMAL HIGH (ref 70–99)
Glucose-Capillary: 200 mg/dL — ABNORMAL HIGH (ref 70–99)
Glucose-Capillary: 167 mg/dL — ABNORMAL HIGH (ref 70–99)
Glucose-Capillary: 150 mg/dL — ABNORMAL HIGH (ref 70–99)

## 2023-03-07 MED ORDER — CHLORHEXIDINE GLUCONATE CLOTH 2 % EX PADS
6.0000 | MEDICATED_PAD | Freq: Every day | CUTANEOUS | Status: DC
  Administered 2023-03-07 – 2023-03-08 (×2): 6 via TOPICAL

## 2023-03-07 NOTE — Hospital Course (Signed)
77 y.o. F with obesity, DM, HTN, COPD, dCHF and recent ESBL bacteremia who presented with confusion, hypotension.  Found to have recurrent ESBL bacteremia.

## 2023-03-07 NOTE — Progress Notes (Signed)
Progress Note   Patient: [name] JYN:[id number] DOB: Dec 03, 1945 DOA: 03/02/2023     5 DOS: the patient was seen and examined on 03/07/2023 at 11:38AM      Brief hospital course: 77 y.o. F with obesity, DM, HTN, COPD, dCHF and recent ESBL bacteremia who presented with confusion, hypotension.  Found to have recurrent ESBL bacteremia.     Assessment and Plan: Sepsis due to ESBL E coli bacteremia - Continue ertapenem - Continue midodrine - Stop Toviaz   New onset atrial fibrillation Rate controlled - Continue DAPT  Constipation Resolved - Continue scheduled bowel regimen  Encephalopathy Improving as expected  Acute renal failure Resolved  Chronic diastolic CHF Appears euvolemic  Diabetes Glucose controlled - Continue SS correction insulin        Subjective: Patient still feels "terrible" and weak and fatigued.  No fever, no vomiting. No nursing concerns.     Physical Exam: BP 139/60 (BP Location: Right Arm)   Pulse 63   Temp 98.3 F (36.8 C)   Resp 17   Ht 5\' 3"  (1.6 m)   Wt 84.4 kg   SpO2 97%   BMI 32.96 kg/m   Elderly adult female, lying in bed no acute distress RRR systolic murmur radiating to the carotids, No peripheral edema RR normal, lungs clear Abdomen with guarding throughtout, no rigidity or rebound Attention normal, affect tired,orented to self, [facility], situation but with some mild short term memory loss and psychomotor slowing   Data Reviewed: WBC 13K, stable Hgb 8.3 no change Cr 1.2, stable, Na and K normal  Family Communication: None present    Disposition: Status is: Inpatient         Author: Alberteen Sam, MD 03/07/2023 5:03 PM  For on call review [email or web address].

## 2023-03-07 NOTE — Consult Note (Signed)
[facility] Liaison Note  03/07/2023  [name] Apr 02, 1946 [id number]  Covering Charlesetta Shanks, RN [facility] hospital liaison).  Location: screened the patient remotely at [facility] ED.  Insurance: Medicare   [name] is a 77 y.o. female who is a Primary Care Patient of Paulina Fusi, MD The patient was screened for 30 day readmission hospitalization with noted high risk score for unplanned readmission risk with 3 IP in 6 months.  The patient was assessed for potential Care Management service needs for post hospital transition for care coordination. Review of patient's electronic medical record reveals patient was admitted with Septic Shock. Pt will transition to a long-term nursing facility [facility]). The facility will continue to address pt's ongoing needs at that time.   [facility]BC[facility] Care Management/Population Health does not replace or interfere with any arrangements made by the Inpatient Transition of Care team.   For questions contact:   Elliot Cousin, RN, [facility] Liaison [employer]   [facility], Population Health Office Hours MTWF  8:00 am-6:00 pm Direct Dial: [phone] mobile [phone] [Office toll free line] Office Hours are M-F 8:30 - 5 pm [email or web address]

## 2023-03-07 NOTE — Telephone Encounter (Signed)
Spoke to patients husband Lorella Nimrod who advised patient was still in the hospital. States she will be d/c today or tomorrow. Currently awaiting a response from Renee to see if patient will be seen while in the hospital or if I need to send referral to AF clinic. Luster Landsberg has not seen message as of yet. Will follow back up after I know further information. Husband updated to plan.

## 2023-03-07 NOTE — Telephone Encounter (Signed)
Statement from [facility] "we aren't seeing her, I spoke to attending MD, her EKG had AFib on it. he was going to follow up on it and make a decision on a/c or not, she was pretty sick, frail apparently .'

## 2023-03-07 NOTE — Plan of Care (Signed)

## 2023-03-07 NOTE — TOC Progression Note (Signed)
Transition of Care [facility]) - Progression Note    Patient Details  Name: [name] MRN: [id number] Date of Birth: October 18, 1945  Transition of Care [facility]) CM/SW Contact  Lorri Frederick, LCSW Phone Number: 03/07/2023, 9:52 AM  Clinical Narrative:   CSW received call from Cyndia Bent.  He has spoken to Jomarie Longs at [facility] and are accepting bed offer.  Financials are all worked out as well.       Expected Discharge Plan: Long Term Nursing Home Barriers to Discharge: Continued Medical Work up  Expected Discharge Plan and Services In-house Referral: Clinical Social Work   Post Acute Care Choice: Skilled Nursing Facility Living arrangements for the past 2 months: Skilled Nursing Facility Risk analyst)                                       Social Determinants of Health (SDOH) Interventions SDOH Screenings   Food Insecurity: No Food Insecurity (03/05/2023)  Housing: Low Risk  (03/05/2023)  Transportation Needs: No Transportation Needs (03/05/2023)  Utilities: Not At Risk (03/05/2023)  Alcohol Screen: Low Risk  (12/25/2018)  Depression (PHQ2-9): Low Risk  (12/25/2018)  Financial Resource Strain: Low Risk  (12/20/2018)  Physical Activity: Inactive (12/20/2018)  Social Connections: Moderately Integrated (12/25/2018)  Stress: Stress Concern Present (12/20/2018)  Tobacco Use: Low Risk  (03/02/2023)    Readmission Risk Interventions     No data to display

## 2023-03-08 DIAGNOSIS — A419 Sepsis, unspecified organism: Secondary | ICD-10-CM | POA: Diagnosis not present

## 2023-03-08 DIAGNOSIS — I252 Old myocardial infarction: Secondary | ICD-10-CM | POA: Diagnosis not present

## 2023-03-08 DIAGNOSIS — J449 Chronic obstructive pulmonary disease, unspecified: Secondary | ICD-10-CM | POA: Diagnosis not present

## 2023-03-08 DIAGNOSIS — R2689 Other abnormalities of gait and mobility: Secondary | ICD-10-CM | POA: Diagnosis not present

## 2023-03-08 DIAGNOSIS — R404 Transient alteration of awareness: Secondary | ICD-10-CM | POA: Diagnosis not present

## 2023-03-08 DIAGNOSIS — I959 Hypotension, unspecified: Secondary | ICD-10-CM | POA: Diagnosis not present

## 2023-03-08 DIAGNOSIS — I4891 Unspecified atrial fibrillation: Secondary | ICD-10-CM | POA: Diagnosis not present

## 2023-03-08 DIAGNOSIS — R296 Repeated falls: Secondary | ICD-10-CM | POA: Diagnosis not present

## 2023-03-08 DIAGNOSIS — I491 Atrial premature depolarization: Secondary | ICD-10-CM | POA: Diagnosis not present

## 2023-03-08 DIAGNOSIS — I5032 Chronic diastolic (congestive) heart failure: Secondary | ICD-10-CM | POA: Diagnosis not present

## 2023-03-08 DIAGNOSIS — Z1612 Extended spectrum beta lactamase (ESBL) resistance: Secondary | ICD-10-CM | POA: Diagnosis not present

## 2023-03-08 DIAGNOSIS — J189 Pneumonia, unspecified organism: Secondary | ICD-10-CM | POA: Diagnosis not present

## 2023-03-08 DIAGNOSIS — R5381 Other malaise: Secondary | ICD-10-CM | POA: Diagnosis not present

## 2023-03-08 DIAGNOSIS — Z8673 Personal history of transient ischemic attack (TIA), and cerebral infarction without residual deficits: Secondary | ICD-10-CM | POA: Diagnosis not present

## 2023-03-08 DIAGNOSIS — N3289 Other specified disorders of bladder: Secondary | ICD-10-CM | POA: Diagnosis not present

## 2023-03-08 DIAGNOSIS — N390 Urinary tract infection, site not specified: Secondary | ICD-10-CM | POA: Diagnosis not present

## 2023-03-08 DIAGNOSIS — I10 Essential (primary) hypertension: Secondary | ICD-10-CM | POA: Diagnosis not present

## 2023-03-08 DIAGNOSIS — R000 Tachycardia, unspecified: Secondary | ICD-10-CM | POA: Diagnosis not present

## 2023-03-08 DIAGNOSIS — R059 Cough, unspecified: Secondary | ICD-10-CM | POA: Diagnosis not present

## 2023-03-08 DIAGNOSIS — R262 Difficulty in walking, not elsewhere classified: Secondary | ICD-10-CM | POA: Diagnosis not present

## 2023-03-08 DIAGNOSIS — R6521 Severe sepsis with septic shock: Secondary | ICD-10-CM | POA: Diagnosis not present

## 2023-03-08 DIAGNOSIS — R9431 Abnormal electrocardiogram [ECG] [EKG]: Secondary | ICD-10-CM | POA: Diagnosis not present

## 2023-03-08 DIAGNOSIS — I444 Left anterior fascicular block: Secondary | ICD-10-CM | POA: Diagnosis not present

## 2023-03-08 DIAGNOSIS — E785 Hyperlipidemia, unspecified: Secondary | ICD-10-CM | POA: Diagnosis not present

## 2023-03-08 DIAGNOSIS — E118 Type 2 diabetes mellitus with unspecified complications: Secondary | ICD-10-CM | POA: Diagnosis not present

## 2023-03-08 DIAGNOSIS — I482 Chronic atrial fibrillation, unspecified: Secondary | ICD-10-CM | POA: Diagnosis not present

## 2023-03-08 DIAGNOSIS — R1312 Dysphagia, oropharyngeal phase: Secondary | ICD-10-CM | POA: Diagnosis not present

## 2023-03-08 DIAGNOSIS — R0689 Other abnormalities of breathing: Secondary | ICD-10-CM | POA: Diagnosis not present

## 2023-03-08 DIAGNOSIS — N138 Other obstructive and reflux uropathy: Secondary | ICD-10-CM | POA: Diagnosis not present

## 2023-03-08 DIAGNOSIS — Z7409 Other reduced mobility: Secondary | ICD-10-CM | POA: Diagnosis not present

## 2023-03-08 DIAGNOSIS — J9601 Acute respiratory failure with hypoxia: Secondary | ICD-10-CM | POA: Diagnosis not present

## 2023-03-08 DIAGNOSIS — R41841 Cognitive communication deficit: Secondary | ICD-10-CM | POA: Diagnosis not present

## 2023-03-08 DIAGNOSIS — M6259 Muscle wasting and atrophy, not elsewhere classified, multiple sites: Secondary | ICD-10-CM | POA: Diagnosis not present

## 2023-03-08 DIAGNOSIS — L03317 Cellulitis of buttock: Secondary | ICD-10-CM | POA: Diagnosis not present

## 2023-03-08 DIAGNOSIS — I4510 Unspecified right bundle-branch block: Secondary | ICD-10-CM | POA: Diagnosis not present

## 2023-03-08 DIAGNOSIS — E119 Type 2 diabetes mellitus without complications: Secondary | ICD-10-CM | POA: Diagnosis not present

## 2023-03-08 DIAGNOSIS — R0602 Shortness of breath: Secondary | ICD-10-CM | POA: Diagnosis not present

## 2023-03-08 DIAGNOSIS — J9600 Acute respiratory failure, unspecified whether with hypoxia or hypercapnia: Secondary | ICD-10-CM | POA: Diagnosis not present

## 2023-03-08 DIAGNOSIS — Z7401 Bed confinement status: Secondary | ICD-10-CM | POA: Diagnosis not present

## 2023-03-08 DIAGNOSIS — L0231 Cutaneous abscess of buttock: Secondary | ICD-10-CM | POA: Diagnosis not present

## 2023-03-08 DIAGNOSIS — R2681 Unsteadiness on feet: Secondary | ICD-10-CM | POA: Diagnosis not present

## 2023-03-08 DIAGNOSIS — I509 Heart failure, unspecified: Secondary | ICD-10-CM | POA: Diagnosis not present

## 2023-03-08 LAB — BASIC METABOLIC PANEL
Anion gap: 6 (ref 5–15)
BUN: 18 mg/dL (ref 8–23)
CO2: 26 mmol/L (ref 22–32)
Calcium: 7.9 mg/dL — ABNORMAL LOW (ref 8.9–10.3)
Chloride: 109 mmol/L (ref 98–111)
Creatinine, Ser: 1.04 mg/dL — ABNORMAL HIGH (ref 0.44–1.00)
GFR, Estimated: 56 mL/min — ABNORMAL LOW (ref >60)
Glucose, Bld: 196 mg/dL — ABNORMAL HIGH (ref 70–99)
Potassium: 3.8 mmol/L (ref 3.5–5.1)
Sodium: 141 mmol/L (ref 135–145)

## 2023-03-08 LAB — CBC
HCT: 27 % — ABNORMAL LOW (ref 36.0–46.0)
Hemoglobin: 8.5 g/dL — ABNORMAL LOW (ref 12.0–15.0)
MCH: 28.5 pg (ref 26.0–34.0)
MCHC: 31.5 g/dL (ref 30.0–36.0)
MCV: 90.6 fL (ref 80.0–100.0)
Platelets: 277 10*3/uL (ref 150–400)
RBC: 2.98 MIL/uL — ABNORMAL LOW (ref 3.87–5.11)
RDW: 15.4 % (ref 11.5–15.5)
WBC: 12.3 10*3/uL — ABNORMAL HIGH (ref 4.0–10.5)
nRBC: 0 % (ref 0.0–0.2)

## 2023-03-08 LAB — GLUCOSE, CAPILLARY
Glucose-Capillary: 174 mg/dL — ABNORMAL HIGH (ref 70–99)
Glucose-Capillary: 146 mg/dL — ABNORMAL HIGH (ref 70–99)

## 2023-03-08 MED ORDER — ERTAPENEM IV (FOR PTA / DISCHARGE USE ONLY): 1.0000 g | INTRAVENOUS | 0 refills | Status: AC

## 2023-03-08 MED ORDER — MIDODRINE HCL 10 MG PO TABS: 10.0000 mg | ORAL_TABLET | Freq: Three times a day (TID) | ORAL | Status: DC

## 2023-03-08 NOTE — TOC Transition Note (Signed)
Transition of Care [facility]) - CM/SW Discharge Note   Patient Details  Name: [name] MRN: [id number] Date of Birth: [birth year]  Transition of [facility]) CM/SW Contact:  Erin Sons, LCSW Phone Number: 03/08/2023, 11:15 AM   Clinical Narrative:      Per MD patient ready for DC to [facility]. RN, patient, patient's family, and facility notified of DC. Discharge Summary and FL2 sent to facility. RN to call report prior to discharge [phone]  room 208). DC packet on chart. Ambulance transport requested for patient.   CSW will sign off for now as social work intervention is no longer needed. Please consult [place] again if new needs arise.   Final next level of care: Skilled Nursing Facility Barriers to Discharge: No Barriers Identified   Patient Goals and CMS Choice CMS Medicare.gov Compare Post Acute Care list provided to:: Patient Choice offered to / list presented to : Patient, Spouse (nephew Brett Canales)  Discharge Placement                Patient chooses bed at:  ([facility]) Patient to be transferred to facility by: [facility] Name of family member notified: nephew steve Patient and family notified of of transfer: 03/08/23  Discharge Plan and Services Additional resources added to the After Visit Summary for   In-house Referral: Clinical Social Work   Post Acute Care Choice: Skilled Nursing Facility                               Social Determinants of Health (SDOH) Interventions SDOH Screenings   Food Insecurity: No Food Insecurity (03/05/2023)  Housing: Low Risk  (03/05/2023)  Transportation Needs: No Transportation Needs (03/05/2023)  Utilities: Not At Risk (03/05/2023)  Alcohol Screen: Low Risk  (12/25/2018)  Depression (PHQ2-9): Low Risk  (12/25/2018)  Financial Resource Strain: Low Risk  (12/20/2018)  Physical Activity: Inactive (12/20/2018)  Social Connections: Moderately Integrated (12/25/2018)  Stress: Stress Concern Present (12/20/2018)  Tobacco Use: Low  Risk  (03/02/2023)     Readmission Risk Interventions     No data to display

## 2023-03-08 NOTE — Discharge Summary (Signed)
Physician Discharge Summary   Patient: [name] MRN: [id number] DOB: 1945-10-17  Admit date:     03/02/2023  Discharge date: 03/08/23  Discharge Physician: Alberteen Sam   PCP: Paulina Fusi, MD     Recommendations at discharge:  Follow up with [facility] as soon as able, referral pending, [place] please call to confirm Follow up with Dr. Thedore Mins Infectious Disease Nov 11 at 2:15PM Hold glargine insulin, monitor glucose daily and resume as appropriate Monitor for dysuria, foul urine and send to hospital or call Dr. Nobie Putnam for Infectious Disease if present Follow up with Cardiology within 4-6 weeks for atrial fibrillation Continue Ertapenem daily until Nov 3 Check CBC/Diff, BMP, ESR and CRP weekly while on Ertapenem     Discharge Diagnoses: Active Problems:   Septic shock due to recurrent pyelonephritis and ESBL E. coli bacteremia Other hospital problems   Acute toxic and metabolic encephalopathy   New onset atrial fibrillation acute kidney injury on chronic kidney disease stage IIIa   Fecal impaction   Chronic diastolic congestive heart failure   Normocytic anemia   Type 2 diabetes with peripheral neuropathy   Obesity  Hospital Course: 77 y.o. F with obesity, DM, HTN, COPD, dCHF and recent ESBL bacteremia who presented with confusion, hypotension.  Found to have recurrent ESBL bacteremia.     E. coli bacteremia Patient admitted to the ICU on pressors, stabilized.  Urine again growing ESBL E. coli, blood cultures growing ESBL E. coli.  ID were consulted, they recommended completing course of IV ertapenem, end of therapy 11/3.   Bladder thickening Given back-to-back ESBL bacteremia, this raises the question whether the bladder thickening on her CT is in fact bladder cancer.  Recommend close urology follow-up, referral pending.  If there is in fact an anatomical reason for her recurrent bacteremia, unfortunately, she is a poor  candidate for surgery, and this may be untreatable.  Discussed with POA/nephew by phone.    New onset atrial fibrillation Patient's recent loop recorder showed evidence for atrial fibrillation.  Given this was present in the setting of sepsis, and the patient's overall poor functional status, will defer anticoagulation at this time.   - Recommend cardiology follow-up for atrial fibrillation in 4-6 weeks   Fecal impaction Patient had several soft bowel movements in the hospital, and the seem to be resolved. - Continue bowel regimen.    Encephalopathy The patient had toxic encephalopathy from her antibiotics and opiates.  This resolved.  She also had metabolic encephalopathy from delirium from sepsis.   Chronic diastolic congestive heart failure Due to hypertension, the patient was started on midodrine and diuretics were held.  Her hemodynamics should be monitored, midodrine weaned, and diuretics resumed as able.  Diabetes Due to deconditioning and poor oral intake, glucoses were low and glargine was held.  Glucoses should be monitored and glargine resumed if appropriate.   Acute renal failure Creatinine improved to baseline in the hospital - Obtain BMP weekly while on ertapenem            The [facility] Controlled Substances Registry was reviewed for this patient prior to discharge.   Consultants: Infectious Disease   Disposition: Skilled nursing facility Diet recommendation:  Discharge Diet Orders (From admission, onward)     Start     Ordered   03/08/23 0000  Diet - low sodium heart healthy        03/08/23 0949  DISCHARGE MEDICATION: Allergies as of 03/08/2023       Reactions   Alendronate Other (See Comments)   Muscle weakness   Valsartan Nausea And Vomiting        Medication List     STOP taking these medications    ciprofloxacin 500 MG tablet Commonly known as: CIPRO   lansoprazole 30 MG capsule Commonly known as:  PREVACID   Lantus 100 UNIT/ML injection Generic drug: insulin glargine   solifenacin 10 MG tablet Commonly known as: VESICARE   torsemide 100 MG tablet Commonly known as: DEMADEX       TAKE these medications    acetaminophen 650 MG CR tablet Commonly known as: TYLENOL Take 650-1,300 mg by mouth every 8 (eight) hours as needed for pain or fever. Do no exceed more than 3000mg  in 24 hours   aspirin 81 MG chewable tablet Chew 81 mg by mouth daily.   atorvastatin 80 MG tablet Commonly known as: LIPITOR Take 80 mg by mouth at bedtime.   calcium carbonate 500 MG chewable tablet Commonly known as: TUMS - dosed in mg elemental calcium Chew 1,000 mg by mouth in the morning, at noon, and at bedtime.   clopidogrel 75 MG tablet Commonly known as: PLAVIX Take 75 mg by mouth daily.   docusate sodium 100 MG capsule Commonly known as: COLACE Take 100 mg by mouth 2 (two) times daily.   ertapenem IVPB Commonly known as: INVANZ Inject 1 g into the vein daily for 5 days. Indication:  ESBL E. Coli Bacteremia First Dose: Yes Last Day of Therapy:  03/11/23 Labs - Once weekly:  CBC/D and BMP, Labs - Once weekly: ESR and CRP Method of administration: Mini-Bag Plus / Gravity Method of administration may be changed at the discretion of home infusion pharmacist based upon assessment of the patient and/or caregiver's ability to self-administer the medication ordered.   ezetimibe 10 MG tablet Commonly known as: ZETIA Take 10 mg by mouth daily.   gabapentin 300 MG capsule Commonly known as: NEURONTIN Take 300 mg by mouth in the morning and at bedtime.   HumaLOG 100 UNIT/ML injection Generic drug: insulin lispro Before each meal 3 times a day, 140-199 - 2 units, 200-250 - 4 units, 251-299 - 6 units,  300-349 - 8 units,  350 or above 10 units. What changed:  how much to take how to take this when to take this additional instructions   midodrine 10 MG tablet Commonly known as:  PROAMATINE Take 1 tablet (10 mg total) by mouth 3 (three) times daily with meals.   nitroGLYCERIN 0.4 MG SL tablet Commonly known as: NITROSTAT Place 1 tablet (0.4 mg total) under the tongue every 5 (five) minutes as needed for chest pain.   polyethylene glycol powder 17 GM/SCOOP powder Commonly known as: GLYCOLAX/MIRALAX Take 17 g by mouth daily.   rOPINIRole 1 MG tablet Commonly known as: REQUIP Take 1 mg by mouth at bedtime.               Discharge Care Instructions  (From admission, onward)           Start     Ordered   03/08/23 0000  Change dressing on IV access line weekly and PRN  (Home infusion instructions - [facility] )        03/08/23 0949   03/08/23 0000  Discharge wound care:       Comments: Cover bilateral buttocks wound with barrier dressing   03/08/23 0949  Follow-up Information     [facility] Follow up.   Contact information: [address] [place] [address] [phone]                Discharge Instructions     [facility] pharmacist to adjust dose for Vancomycin, Aminoglycosides and other anti-infective therapies as requested by physician.   Complete by: As directed    [facility] to provide Cath Flo 2mg    Complete by: As directed    Administer for PICC line occlusion and as ordered by physician for other access device issues.   Anaphylaxis Kit: Provided to treat any anaphylactic reaction to the medication being provided to the patient if First Dose or when requested by physician   Complete by: As directed    Epinephrine 1mg /ml vial / amp: Administer 0.3mg  (0.74ml) subcutaneously once for moderate to severe anaphylaxis, nurse to call physician and pharmacy when reaction occurs and call 911 if needed for immediate care   Diphenhydramine 50mg /ml IV vial: Administer 25-50mg  IV/IM PRN for first dose reaction, rash, itching, mild reaction, nurse to call  physician and pharmacy when reaction occurs   Sodium Chloride 0.9% NS IV: Administer if needed for hypovolemic blood pressure drop or as ordered by physician after call to physician with anaphylactic reaction   Change dressing on IV access line weekly and PRN   Complete by: As directed    Diet - low sodium heart healthy   Complete by: As directed    Discharge wound care:   Complete by: As directed    Cover bilateral buttocks wound with barrier dressing   Flush IV access with Sodium Chloride 0.9% and Heparin 10 units/ml or 100 units/ml   Complete by: As directed    Home infusion instructions - [facility]   Complete by: As directed    Instructions: Flush IV access with Sodium Chloride 0.9% and Heparin 10units/ml or 100units/ml   Change dressing on IV access line: Weekly and PRN   Instructions Cath Flo 2mg : Administer for PICC Line occlusion and as ordered by physician for other access device   [facility] pharmacist to adjust dose for: Vancomycin, Aminoglycosides and other anti-infective therapies as requested by physician   Increase activity slowly   Complete by: As directed    Method of administration may be changed at the discretion of home infusion pharmacist based upon assessment of the patient and/or caregiver's ability to self-administer the medication ordered   Complete by: As directed        Discharge Exam: Filed Weights   03/03/23 0700 03/04/23 0205 03/05/23 0236  Weight: 84.2 kg 84.4 kg 84.4 kg    General: Pt is alert, awake, not in acute distress, lying in bed, debilitated Cardiovascular: Irregular, normal rate, nl S1-S2, no murmurs appreciated.   No LE edema.   Respiratory: Normal respiratory rate and rhythm.  CTAB without rales or wheezes. Abdominal: Abdomen soft and non-tender.  No distension or HSM.   Neuro/Psych: Strength symmetric in upper and lower extremities.  Judgment and insight appear mildly impaired but close to  baseline.   Condition at discharge: stable  The results of significant diagnostics from this hospitalization (including imaging, microbiology, ancillary and laboratory) are listed below for reference.   Imaging Studies: [place] EKG SITE RITE  Result Date: 03/06/2023 If Site Rite image not attached, placement could not be confirmed due to current cardiac rhythm.  CT ABDOMEN PELVIS W CONTRAST  Result  Date: 03/03/2023 CLINICAL DATA:  Abdominal pain, sepsis EXAM: CT ABDOMEN AND PELVIS WITH CONTRAST TECHNIQUE: Multidetector CT imaging of the abdomen and pelvis was performed using the standard protocol following bolus administration of intravenous contrast. RADIATION DOSE REDUCTION: This exam was performed according to the departmental dose-optimization program which includes automated exposure control, adjustment of the mA and/or kV according to patient size and/or use of iterative reconstruction technique. CONTRAST:  60mL OMNIPAQUE IOHEXOL 350 MG/ML SOLN COMPARISON:  01/28/2023 FINDINGS: Lower chest: Trace bilateral pleural effusions. Scattered hypoventilatory changes at the lung bases. Dense calcification of the mitral annulus and coronary artery atherosclerosis again noted. Hepatobiliary: No focal liver abnormality is seen. Status post cholecystectomy. No biliary dilatation. Pancreas: Unremarkable. No pancreatic ductal dilatation or surrounding inflammatory changes. Spleen: Normal in size without acute focal abnormality. Adrenals/Urinary Tract: Bladder is decompressed with a Foley catheter. Even allowing for changes due to under distension, there is moderate diffuse bladder wall thickening concerning for superimposed cystitis. Please correlate with urinalysis. There is mild bilateral hydronephrosis and hydroureter, with associated mucosal enhancement throughout the ureters and renal pelves concerning for urinary tract infection. The subtle areas of geographic decreased attenuation within the renal  parenchyma noted on prior CT have resolved in the interim. There is no CT evidence of acute pyelonephritis on this exam. No evidence of renal abscess or urinary tract calculi. The adrenals are stable and unremarkable. Stomach/Bowel: No bowel obstruction or ileus. There is a large amount of retained stool throughout the colon consistent with constipation. Specifically, a large amount of retained stool and excreted contrast within the rectal vault may reflect fecal impaction. This may have some mass effect upon the bladder and bilateral UVJ's, which could contribute to the obstructive uropathy described above. Nonspecific wall thickening of the anal rectal junction could reflect changes of proctitis. Mild rectal wall thickening and perirectal fat stranding could reflect superimposed stercoral colitis as well. The appendix is surgically absent. Vascular/Lymphatic: Diffuse atherosclerosis throughout the aorta and its branches again noted. No pathologic adenopathy. Reproductive: Status post hysterectomy. No adnexal masses. Other: Trace free fluid within the lower abdomen and pelvis, nonspecific. No free intraperitoneal gas. No abdominal wall hernia. Musculoskeletal: Right hip arthroplasty. No acute or destructive bony abnormalities. Reconstructed images demonstrate no additional findings. IMPRESSION: 1. Moderate bladder wall thickening, even in light of decompression by Foley catheter, concerning for cystitis. Mild bilateral hydroureteronephrosis with associated mucosal enhancement of the renal collecting systems and ureters could reflect ascending urinary tract infection. No evidence of pyelonephritis on this exam. Please correlate with urinalysis. 2. Significant fecal retention consistent with constipation. Marked retained stool within the rectal vault consistent with fecal impaction, with likely superimposed changes of stercoral colitis. 3. Prominent wall thickening of the anorectal junction, which could reflect  proctitis. Correlation with physical exam and visual inspection is recommended, to exclude superimposed neoplasm. 4. Trace pelvic free fluid, likely reactive. 5. Trace bilateral pleural effusions. 6. Aortic Atherosclerosis (ICD10-I70.0). Coronary artery atherosclerosis. Electronically Signed   By: Sharlet Salina M.D.   On: 03/03/2023 15:56   DG Chest Port 1 View  Result Date: 03/02/2023 CLINICAL DATA:  Sepsis EXAM: PORTABLE CHEST 1 VIEW COMPARISON:  01/28/2023 FINDINGS: Underinflation. Once again there are areas of interstitial thickening bilaterally. No pneumothorax, effusion or consolidation. Normal cardiopericardial silhouette with calcified aorta. Prominent calcifications along the mitral valve annulus. Loop recorder overlying the lower left hemithorax. Overlapping cardiac leads. Degenerative changes seen of the spine and shoulders. Osteopenia. Old right rib fractures. IMPRESSION: Underinflation with interstitial changes, chronic.  Loop recorder. Electronically Signed   By: Karen Kays M.D.   On: 03/02/2023 12:06   CUP PACEART REMOTE DEVICE CHECK  Result Date: 02/20/2023 ILR summary report received. Battery status OK. Normal device function. No new symptom, tachy, brady, or pause episodes. No new AF episodes. Monthly summary reports and ROV/PRN. MC, CVRS   Microbiology: Results for orders placed or performed during the hospital encounter of 03/02/23  Urine Culture     Status: Abnormal   Collection Time: 03/02/23 10:15 AM   Specimen: Urine, Random  Result Value Ref Range Status   Specimen Description URINE, RANDOM  Final   Special Requests   Final    NONE Reflexed from [id number] Performed at [facility], [address] [place], [place] [address]    Culture (A)  Final    >=100,000 COLONIES/mL ESCHERICHIA COLI Confirmed Extended Spectrum Beta-Lactamase Producer (ESBL).  In bloodstream infections from ESBL organisms, carbapenems are preferred over piperacillin/tazobactam. They are shown  to have a lower risk of mortality.    Report Status 03/04/2023 FINAL  Final   Organism ID, Bacteria ESCHERICHIA COLI (A)  Final      Susceptibility   Escherichia coli - MIC*    AMPICILLIN >=32 RESISTANT Resistant     CEFAZOLIN >=64 RESISTANT Resistant     CEFEPIME >=32 RESISTANT Resistant     CEFTRIAXONE >=64 RESISTANT Resistant     CIPROFLOXACIN >=4 RESISTANT Resistant     GENTAMICIN >=16 RESISTANT Resistant     IMIPENEM <=0.25 SENSITIVE Sensitive     NITROFURANTOIN <=16 SENSITIVE Sensitive     TRIMETH/SULFA >=320 RESISTANT Resistant     AMPICILLIN/SULBACTAM >=32 RESISTANT Resistant     PIP/TAZO 16 SENSITIVE Sensitive ug/mL    * >=100,000 COLONIES/mL ESCHERICHIA COLI  Resp panel by RT-PCR (RSV, Flu A&B, Covid) Peripheral     Status: None   Collection Time: 03/02/23 10:47 AM   Specimen: Peripheral; Nasal Swab  Result Value Ref Range Status   SARS Coronavirus 2 by RT PCR NEGATIVE NEGATIVE Final   Influenza A by PCR NEGATIVE NEGATIVE Final   Influenza B by PCR NEGATIVE NEGATIVE Final    Comment: (NOTE) The Xpert Xpress SARS-CoV-2/FLU/RSV plus assay is intended as an aid in the diagnosis of influenza from Nasopharyngeal swab specimens and should not be used as a sole basis for treatment. Nasal washings and aspirates are unacceptable for Xpert Xpress SARS-CoV-2/FLU/RSV testing.  Fact Sheet for Patients: BloggerCourse.com  Fact Sheet for Healthcare Providers: SeriousBroker.it  This test is not yet approved or cleared by the [place] FDA and has been authorized for detection and/or diagnosis of SARS-CoV-2 by FDA under an Emergency Use Authorization (EUA). This EUA will remain in effect (meaning this test can be used) for the duration of the COVID-19 declaration under Section 564(b)(1) of the Act, 21 U.S.C. section 360bbb-3(b)(1), unless the authorization is terminated or revoked.     Resp Syncytial Virus by PCR NEGATIVE  NEGATIVE Final    Comment: (NOTE) Fact Sheet for Patients: BloggerCourse.com  Fact Sheet for Healthcare Providers: SeriousBroker.it  This test is not yet approved or cleared by the [place] FDA and has been authorized for detection and/or diagnosis of SARS-CoV-2 by FDA under an Emergency Use Authorization (EUA). This EUA will remain in effect (meaning this test can be used) for the duration of the COVID-19 declaration under Section 564(b)(1) of the Act, 21 U.S.C. section 360bbb-3(b)(1), unless the authorization is terminated or revoked.  Performed at [facility],  [address], [place], [place] [address]   Blood Culture (routine x 2)     Status: Abnormal   Collection Time: 03/02/23 10:47 AM   Specimen: BLOOD  Result Value Ref Range Status   Specimen Description BLOOD BLOOD RIGHT ARM  Final   Special Requests   Final    BOTTLES DRAWN AEROBIC AND ANAEROBIC Blood Culture results may not be optimal due to an inadequate volume of blood received in culture bottles   Culture  Setup Time   Final    GRAM NEGATIVE RODS AEROBIC BOTTLE ONLY CRITICAL VALUE NOTED.  VALUE IS CONSISTENT WITH PREVIOUSLY REPORTED AND CALLED VALUE.    Culture (A)  Final    ESCHERICHIA COLI SUSCEPTIBILITIES PERFORMED ON PREVIOUS CULTURE WITHIN THE LAST 5 DAYS. Performed at [facility], [address] [place], [place] [address]    Report Status 03/07/2023 FINAL  Final  Blood Culture (routine x 2)     Status: Abnormal   Collection Time: 03/02/23 10:47 AM   Specimen: BLOOD  Result Value Ref Range Status   Specimen Description BLOOD BLOOD LEFT ARM  Final   Special Requests   Final    BOTTLES DRAWN AEROBIC AND ANAEROBIC Blood Culture adequate volume   Culture  Setup Time   Final    IN BOTH AEROBIC AND ANAEROBIC BOTTLES GRAM NEGATIVE RODS CRITICAL RESULT CALLED TO, READ BACK BY AND VERIFIED WITH: V BRYK,PHARMD@0645  03/03/23 MK Performed at  [facility], [address], [place], [place] [address]    Culture (A)  Final    ESCHERICHIA COLI Confirmed Extended Spectrum Beta-Lactamase Producer (ESBL).  In bloodstream infections from ESBL organisms, carbapenems are preferred over piperacillin/tazobactam. They are shown to have a lower risk of mortality.    Report Status 03/05/2023 FINAL  Final   Organism ID, Bacteria ESCHERICHIA COLI  Final   Organism ID, Bacteria ESCHERICHIA COLI  Final      Susceptibility   Escherichia coli - KIRBY BAUER*    CEFAZOLIN RESISTANT Resistant    Escherichia coli - MIC*    AMPICILLIN >=32 RESISTANT Resistant     CEFEPIME >=32 RESISTANT Resistant     CEFTAZIDIME RESISTANT Resistant     CEFTRIAXONE >=64 RESISTANT Resistant     CIPROFLOXACIN >=4 RESISTANT Resistant     GENTAMICIN >=16 RESISTANT Resistant     IMIPENEM <=0.25 SENSITIVE Sensitive     TRIMETH/SULFA >=320 RESISTANT Resistant     AMPICILLIN/SULBACTAM >=32 RESISTANT Resistant     PIP/TAZO 16 SENSITIVE Sensitive ug/mL    * ESCHERICHIA COLI    ESCHERICHIA COLI  Blood Culture ID Panel (Reflexed)     Status: Abnormal   Collection Time: 03/02/23 10:47 AM  Result Value Ref Range Status   Enterococcus faecalis NOT DETECTED NOT DETECTED Final   Enterococcus Faecium NOT DETECTED NOT DETECTED Final   Listeria monocytogenes NOT DETECTED NOT DETECTED Final   Staphylococcus species NOT DETECTED NOT DETECTED Final   Staphylococcus aureus (BCID) NOT DETECTED NOT DETECTED Final   Staphylococcus epidermidis NOT DETECTED NOT DETECTED Final   Staphylococcus lugdunensis NOT DETECTED NOT DETECTED Final   Streptococcus species NOT DETECTED NOT DETECTED Final   Streptococcus agalactiae NOT DETECTED NOT DETECTED Final   Streptococcus pneumoniae NOT DETECTED NOT DETECTED Final   Streptococcus pyogenes NOT DETECTED NOT DETECTED Final   A.calcoaceticus-baumannii NOT DETECTED NOT DETECTED Final   Bacteroides fragilis NOT DETECTED NOT DETECTED Final    Enterobacterales DETECTED (A) NOT DETECTED Final    Comment:  Enterobacterales represent a large order of gram negative bacteria, not a single organism. CRITICAL RESULT CALLED TO, READ BACK BY AND VERIFIED WITH: V BRYK,PHARMD@0645  03/03/23 MK    Enterobacter cloacae complex NOT DETECTED NOT DETECTED Final   Escherichia coli DETECTED (A) NOT DETECTED Final    Comment: CRITICAL RESULT CALLED TO, READ BACK BY AND VERIFIED WITH: V BRYK,PHARMD@0645  03/03/23 MK    Klebsiella aerogenes NOT DETECTED NOT DETECTED Final   Klebsiella oxytoca NOT DETECTED NOT DETECTED Final   Klebsiella pneumoniae NOT DETECTED NOT DETECTED Final   Proteus species NOT DETECTED NOT DETECTED Final   Salmonella species NOT DETECTED NOT DETECTED Final   Serratia marcescens NOT DETECTED NOT DETECTED Final   Haemophilus influenzae NOT DETECTED NOT DETECTED Final   Neisseria meningitidis NOT DETECTED NOT DETECTED Final   Pseudomonas aeruginosa NOT DETECTED NOT DETECTED Final   Stenotrophomonas maltophilia NOT DETECTED NOT DETECTED Final   Candida albicans NOT DETECTED NOT DETECTED Final   Candida auris NOT DETECTED NOT DETECTED Final   Candida glabrata NOT DETECTED NOT DETECTED Final   Candida krusei NOT DETECTED NOT DETECTED Final   Candida parapsilosis NOT DETECTED NOT DETECTED Final   Candida tropicalis NOT DETECTED NOT DETECTED Final   Cryptococcus neoformans/gattii NOT DETECTED NOT DETECTED Final   CTX-M ESBL DETECTED (A) NOT DETECTED Final    Comment: CRITICAL RESULT CALLED TO, READ BACK BY AND VERIFIED WITH: V BRYK,PHARMD@0645  03/03/23 MK (NOTE) Extended spectrum beta-lactamase detected. Recommend a carbapenem as initial therapy.      Carbapenem resistance IMP NOT DETECTED NOT DETECTED Final   Carbapenem resistance KPC NOT DETECTED NOT DETECTED Final   Carbapenem resistance NDM NOT DETECTED NOT DETECTED Final   Carbapenem resist OXA 48 LIKE NOT DETECTED NOT DETECTED Final   Carbapenem resistance VIM NOT  DETECTED NOT DETECTED Final    Comment: Performed at [facility], [address], [place], [place] [address]  MRSA Next Gen by PCR, Nasal     Status: None   Collection Time: 03/02/23  6:48 PM   Specimen: Nasal Mucosa; Nasal Swab  Result Value Ref Range Status   MRSA by PCR Next Gen NOT DETECTED NOT DETECTED Final    Comment: (NOTE) The GeneXpert MRSA Assay (FDA approved for NASAL specimens only), is one component of a comprehensive MRSA colonization surveillance program. It is not intended to diagnose MRSA infection nor to guide or monitor treatment for MRSA infections. Test performance is not FDA approved in patients less than 70 years old. Performed at [facility], [address] [place], [place] [address]     Labs: CBC: Recent Labs  Lab 03/02/23 1157 03/02/23 1728 03/04/23 0340 03/05/23 0437 03/06/23 0439 03/07/23 0212 03/08/23 0212  WBC 27.5*   < > 21.7* 16.5* 13.1* 13.1* 12.3*  NEUTROABS 23.7*  --   --   --   --   --   --   HGB 8.9*   < > 8.1* 8.1* 8.8* 8.3* 8.5*  HCT 28.2*   < > 24.8* 24.7* 27.4* 26.9* 27.0*  MCV 89.5   < > 86.7 88.8 89.5 89.7 90.6  PLT 334   < > 286 296 305 291 277   < > = values in this interval not displayed.   Basic Metabolic Panel: Recent Labs  Lab 03/03/23 0922 03/03/23 2134 03/04/23 0340 03/05/23 0437 03/06/23 0439 03/07/23 0212 03/08/23 0212  NA 140 137  --  140 140 141 141  K 3.5 3.5  --  3.5 4.0 3.9 3.8  CL 104 103  --  105 107 108 109  CO2 22 19*  --  25 23 26 26   GLUCOSE 158* 143*  --  135* 199* 192* 196*  BUN 34* 37*  --  34* 31* 25* 18  CREATININE 1.66* 1.32*  --  1.23* 1.36* 1.27* 1.04*  CALCIUM 7.8* 7.6*  --  7.9* 7.8* 7.6* 7.9*  MG 1.7  --  1.8  --   --   --   --   PHOS 4.2  --  3.3  --   --   --   --    Liver Function Tests: Recent Labs  Lab 03/02/23 1157  AST 14*  ALT 9  ALKPHOS 54  BILITOT 1.0  PROT 5.8*  ALBUMIN 2.0*   CBG: Recent Labs  Lab 03/07/23 0709 03/07/23 1109 03/07/23 1609  03/07/23 2117 03/08/23 0733  GLUCAP 191* 200* 167* 150* 174*    Discharge time spent: approximately 35 minutes spent on discharge counseling, evaluation of patient on day of discharge, and coordination of discharge planning with nursing, social work, pharmacy and case management  Signed: Alberteen Sam, MD [facility] 03/08/2023

## 2023-03-08 NOTE — Progress Notes (Signed)
Report given to Ashley

## 2023-03-08 NOTE — Plan of Care (Signed)

## 2023-03-09 DIAGNOSIS — E118 Type 2 diabetes mellitus with unspecified complications: Secondary | ICD-10-CM | POA: Diagnosis not present

## 2023-03-09 DIAGNOSIS — R5381 Other malaise: Secondary | ICD-10-CM | POA: Diagnosis not present

## 2023-03-09 DIAGNOSIS — N390 Urinary tract infection, site not specified: Secondary | ICD-10-CM | POA: Diagnosis not present

## 2023-03-09 DIAGNOSIS — N3289 Other specified disorders of bladder: Secondary | ICD-10-CM | POA: Diagnosis not present

## 2023-03-12 DIAGNOSIS — R5381 Other malaise: Secondary | ICD-10-CM | POA: Diagnosis not present

## 2023-03-12 DIAGNOSIS — I482 Chronic atrial fibrillation, unspecified: Secondary | ICD-10-CM | POA: Diagnosis not present

## 2023-03-12 DIAGNOSIS — E118 Type 2 diabetes mellitus with unspecified complications: Secondary | ICD-10-CM | POA: Diagnosis not present

## 2023-03-12 DIAGNOSIS — J449 Chronic obstructive pulmonary disease, unspecified: Secondary | ICD-10-CM | POA: Diagnosis not present

## 2023-03-13 DIAGNOSIS — R059 Cough, unspecified: Secondary | ICD-10-CM | POA: Diagnosis not present

## 2023-03-13 DIAGNOSIS — J189 Pneumonia, unspecified organism: Secondary | ICD-10-CM | POA: Diagnosis not present

## 2023-03-13 DIAGNOSIS — R0602 Shortness of breath: Secondary | ICD-10-CM | POA: Diagnosis not present

## 2023-03-14 DIAGNOSIS — J189 Pneumonia, unspecified organism: Secondary | ICD-10-CM | POA: Diagnosis not present

## 2023-03-18 DIAGNOSIS — I480 Paroxysmal atrial fibrillation: Secondary | ICD-10-CM | POA: Diagnosis not present

## 2023-03-18 DIAGNOSIS — Z79899 Other long term (current) drug therapy: Secondary | ICD-10-CM | POA: Diagnosis not present

## 2023-03-18 DIAGNOSIS — I482 Chronic atrial fibrillation, unspecified: Secondary | ICD-10-CM | POA: Diagnosis not present

## 2023-03-18 DIAGNOSIS — R9431 Abnormal electrocardiogram [ECG] [EKG]: Secondary | ICD-10-CM | POA: Diagnosis not present

## 2023-03-18 DIAGNOSIS — I959 Hypotension, unspecified: Secondary | ICD-10-CM | POA: Diagnosis not present

## 2023-03-18 DIAGNOSIS — I4891 Unspecified atrial fibrillation: Secondary | ICD-10-CM | POA: Diagnosis not present

## 2023-03-18 DIAGNOSIS — E1165 Type 2 diabetes mellitus with hyperglycemia: Secondary | ICD-10-CM | POA: Diagnosis not present

## 2023-03-18 DIAGNOSIS — R6521 Severe sepsis with septic shock: Secondary | ICD-10-CM | POA: Diagnosis not present

## 2023-03-18 DIAGNOSIS — E876 Hypokalemia: Secondary | ICD-10-CM | POA: Diagnosis not present

## 2023-03-18 DIAGNOSIS — I5043 Acute on chronic combined systolic (congestive) and diastolic (congestive) heart failure: Secondary | ICD-10-CM | POA: Diagnosis not present

## 2023-03-18 DIAGNOSIS — F419 Anxiety disorder, unspecified: Secondary | ICD-10-CM | POA: Diagnosis not present

## 2023-03-18 DIAGNOSIS — D649 Anemia, unspecified: Secondary | ICD-10-CM | POA: Diagnosis not present

## 2023-03-18 DIAGNOSIS — M1990 Unspecified osteoarthritis, unspecified site: Secondary | ICD-10-CM | POA: Diagnosis not present

## 2023-03-18 DIAGNOSIS — I509 Heart failure, unspecified: Secondary | ICD-10-CM | POA: Diagnosis not present

## 2023-03-18 DIAGNOSIS — I4510 Unspecified right bundle-branch block: Secondary | ICD-10-CM | POA: Diagnosis not present

## 2023-03-18 DIAGNOSIS — Z8673 Personal history of transient ischemic attack (TIA), and cerebral infarction without residual deficits: Secondary | ICD-10-CM | POA: Diagnosis not present

## 2023-03-18 DIAGNOSIS — A419 Sepsis, unspecified organism: Secondary | ICD-10-CM | POA: Diagnosis not present

## 2023-03-18 DIAGNOSIS — E872 Acidosis, unspecified: Secondary | ICD-10-CM | POA: Diagnosis not present

## 2023-03-18 DIAGNOSIS — J449 Chronic obstructive pulmonary disease, unspecified: Secondary | ICD-10-CM | POA: Diagnosis not present

## 2023-03-18 DIAGNOSIS — I5023 Acute on chronic systolic (congestive) heart failure: Secondary | ICD-10-CM | POA: Diagnosis not present

## 2023-03-18 DIAGNOSIS — I10 Essential (primary) hypertension: Secondary | ICD-10-CM | POA: Diagnosis not present

## 2023-03-18 DIAGNOSIS — J441 Chronic obstructive pulmonary disease with (acute) exacerbation: Secondary | ICD-10-CM | POA: Diagnosis not present

## 2023-03-18 DIAGNOSIS — I110 Hypertensive heart disease with heart failure: Secondary | ICD-10-CM | POA: Diagnosis not present

## 2023-03-18 DIAGNOSIS — I21A1 Myocardial infarction type 2: Secondary | ICD-10-CM | POA: Diagnosis not present

## 2023-03-18 DIAGNOSIS — R0602 Shortness of breath: Secondary | ICD-10-CM | POA: Diagnosis not present

## 2023-03-18 DIAGNOSIS — J189 Pneumonia, unspecified organism: Secondary | ICD-10-CM | POA: Diagnosis not present

## 2023-03-18 DIAGNOSIS — J1569 Pneumonia due to other gram-negative bacteria: Secondary | ICD-10-CM | POA: Diagnosis not present

## 2023-03-18 DIAGNOSIS — I2510 Atherosclerotic heart disease of native coronary artery without angina pectoris: Secondary | ICD-10-CM | POA: Diagnosis not present

## 2023-03-18 DIAGNOSIS — Z7902 Long term (current) use of antithrombotics/antiplatelets: Secondary | ICD-10-CM | POA: Diagnosis not present

## 2023-03-18 DIAGNOSIS — E7800 Pure hypercholesterolemia, unspecified: Secondary | ICD-10-CM | POA: Diagnosis not present

## 2023-03-18 DIAGNOSIS — I214 Non-ST elevation (NSTEMI) myocardial infarction: Secondary | ICD-10-CM | POA: Diagnosis not present

## 2023-03-18 DIAGNOSIS — I252 Old myocardial infarction: Secondary | ICD-10-CM | POA: Diagnosis not present

## 2023-03-18 DIAGNOSIS — I444 Left anterior fascicular block: Secondary | ICD-10-CM | POA: Diagnosis not present

## 2023-03-18 DIAGNOSIS — Z87440 Personal history of urinary (tract) infections: Secondary | ICD-10-CM | POA: Diagnosis not present

## 2023-03-18 DIAGNOSIS — G2581 Restless legs syndrome: Secondary | ICD-10-CM | POA: Diagnosis not present

## 2023-03-18 DIAGNOSIS — J440 Chronic obstructive pulmonary disease with acute lower respiratory infection: Secondary | ICD-10-CM | POA: Diagnosis not present

## 2023-03-18 DIAGNOSIS — Z794 Long term (current) use of insulin: Secondary | ICD-10-CM | POA: Diagnosis not present

## 2023-03-18 DIAGNOSIS — J9601 Acute respiratory failure with hypoxia: Secondary | ICD-10-CM | POA: Diagnosis not present

## 2023-03-18 DIAGNOSIS — Z7401 Bed confinement status: Secondary | ICD-10-CM | POA: Diagnosis not present

## 2023-03-18 DIAGNOSIS — F32A Depression, unspecified: Secondary | ICD-10-CM | POA: Diagnosis not present

## 2023-03-19 ENCOUNTER — Inpatient Hospital Stay: Payer: Medicare Other | Admitting: Internal Medicine

## 2023-03-19 ENCOUNTER — Telehealth: Payer: Self-pay

## 2023-03-19 DIAGNOSIS — I2510 Atherosclerotic heart disease of native coronary artery without angina pectoris: Secondary | ICD-10-CM

## 2023-03-19 DIAGNOSIS — J449 Chronic obstructive pulmonary disease, unspecified: Secondary | ICD-10-CM | POA: Diagnosis not present

## 2023-03-19 DIAGNOSIS — I482 Chronic atrial fibrillation, unspecified: Secondary | ICD-10-CM | POA: Diagnosis not present

## 2023-03-19 DIAGNOSIS — D649 Anemia, unspecified: Secondary | ICD-10-CM | POA: Diagnosis not present

## 2023-03-19 DIAGNOSIS — I5023 Acute on chronic systolic (congestive) heart failure: Secondary | ICD-10-CM | POA: Diagnosis not present

## 2023-03-19 NOTE — Telephone Encounter (Signed)
Spoke with patient's son.   Patient is currently admitted at [facility] in the ICU, ? Pneumonia, per son.  She does have her remote monitor there with her plugged in.  Nothing further at this point.  Will wait to schedule follow ups as needed post discharge.  Plan is now for her to return to care facility after discharge.

## 2023-03-19 NOTE — Telephone Encounter (Signed)
ILR alert for pause 11/9 @ 11:26, EGM appears loss of contact with undersensing Presenting rhythm tachy, some irregularity.  Hx of PAF in the setting of sepsis 10/25, no OAC per EPIC, burden 0.1% Route to triage [place], CVRS  LM on patient/husband primary number and also for son JT to call with update on sxs and flag for updated transmission tomorrow.   Forwarding to Dr. Estill Dooms

## 2023-03-20 DIAGNOSIS — I214 Non-ST elevation (NSTEMI) myocardial infarction: Secondary | ICD-10-CM | POA: Diagnosis not present

## 2023-03-20 DIAGNOSIS — J189 Pneumonia, unspecified organism: Secondary | ICD-10-CM | POA: Diagnosis not present

## 2023-03-20 DIAGNOSIS — I482 Chronic atrial fibrillation, unspecified: Secondary | ICD-10-CM | POA: Diagnosis not present

## 2023-03-21 DIAGNOSIS — I482 Chronic atrial fibrillation, unspecified: Secondary | ICD-10-CM | POA: Diagnosis not present

## 2023-03-21 DIAGNOSIS — J189 Pneumonia, unspecified organism: Secondary | ICD-10-CM | POA: Diagnosis not present

## 2023-03-21 DIAGNOSIS — I214 Non-ST elevation (NSTEMI) myocardial infarction: Secondary | ICD-10-CM | POA: Diagnosis not present

## 2023-03-22 NOTE — Progress Notes (Signed)
Per message from Elliot Cousin, RN hospital liaison, pt going to [facility]  - closing outreach

## 2023-03-26 ENCOUNTER — Ambulatory Visit: Payer: Medicare Other

## 2023-03-26 DIAGNOSIS — R55 Syncope and collapse: Secondary | ICD-10-CM

## 2023-03-26 LAB — CUP PACEART REMOTE DEVICE CHECK
Date Time Interrogation Session: 20241117233232
Implantable Pulse Generator Implant Date: 20240703

## 2023-03-27 DIAGNOSIS — J189 Pneumonia, unspecified organism: Secondary | ICD-10-CM | POA: Diagnosis not present

## 2023-03-27 DIAGNOSIS — R5381 Other malaise: Secondary | ICD-10-CM | POA: Diagnosis not present

## 2023-03-27 DIAGNOSIS — J449 Chronic obstructive pulmonary disease, unspecified: Secondary | ICD-10-CM | POA: Diagnosis not present

## 2023-03-27 DIAGNOSIS — Z7189 Other specified counseling: Secondary | ICD-10-CM | POA: Diagnosis not present

## 2023-03-28 DIAGNOSIS — M6259 Muscle wasting and atrophy, not elsewhere classified, multiple sites: Secondary | ICD-10-CM | POA: Diagnosis not present

## 2023-03-28 DIAGNOSIS — E118 Type 2 diabetes mellitus with unspecified complications: Secondary | ICD-10-CM | POA: Diagnosis not present

## 2023-03-28 DIAGNOSIS — I700 Atherosclerosis of aorta: Secondary | ICD-10-CM | POA: Diagnosis not present

## 2023-03-28 DIAGNOSIS — R41841 Cognitive communication deficit: Secondary | ICD-10-CM | POA: Diagnosis not present

## 2023-03-28 DIAGNOSIS — I5020 Unspecified systolic (congestive) heart failure: Secondary | ICD-10-CM | POA: Diagnosis not present

## 2023-03-28 DIAGNOSIS — R1312 Dysphagia, oropharyngeal phase: Secondary | ICD-10-CM | POA: Diagnosis not present

## 2023-03-28 DIAGNOSIS — J449 Chronic obstructive pulmonary disease, unspecified: Secondary | ICD-10-CM | POA: Diagnosis not present

## 2023-03-28 DIAGNOSIS — N390 Urinary tract infection, site not specified: Secondary | ICD-10-CM | POA: Diagnosis not present

## 2023-03-28 NOTE — ED Notes (Signed)
Blood cultures were collected before antibiotics were administered. Scanning error.

## 2023-03-29 DIAGNOSIS — M6259 Muscle wasting and atrophy, not elsewhere classified, multiple sites: Secondary | ICD-10-CM | POA: Diagnosis not present

## 2023-03-29 DIAGNOSIS — R41841 Cognitive communication deficit: Secondary | ICD-10-CM | POA: Diagnosis not present

## 2023-03-29 DIAGNOSIS — N390 Urinary tract infection, site not specified: Secondary | ICD-10-CM | POA: Diagnosis not present

## 2023-03-29 DIAGNOSIS — R1312 Dysphagia, oropharyngeal phase: Secondary | ICD-10-CM | POA: Diagnosis not present

## 2023-03-30 DIAGNOSIS — N390 Urinary tract infection, site not specified: Secondary | ICD-10-CM | POA: Diagnosis not present

## 2023-03-30 DIAGNOSIS — R1312 Dysphagia, oropharyngeal phase: Secondary | ICD-10-CM | POA: Diagnosis not present

## 2023-03-30 DIAGNOSIS — M6259 Muscle wasting and atrophy, not elsewhere classified, multiple sites: Secondary | ICD-10-CM | POA: Diagnosis not present

## 2023-03-30 DIAGNOSIS — R41841 Cognitive communication deficit: Secondary | ICD-10-CM | POA: Diagnosis not present

## 2023-04-02 DIAGNOSIS — R000 Tachycardia, unspecified: Secondary | ICD-10-CM | POA: Diagnosis not present

## 2023-04-02 DIAGNOSIS — M19042 Primary osteoarthritis, left hand: Secondary | ICD-10-CM | POA: Diagnosis not present

## 2023-04-02 DIAGNOSIS — I252 Old myocardial infarction: Secondary | ICD-10-CM | POA: Diagnosis not present

## 2023-04-02 DIAGNOSIS — R7989 Other specified abnormal findings of blood chemistry: Secondary | ICD-10-CM | POA: Diagnosis not present

## 2023-04-02 DIAGNOSIS — Z87440 Personal history of urinary (tract) infections: Secondary | ICD-10-CM | POA: Diagnosis not present

## 2023-04-02 DIAGNOSIS — N183 Chronic kidney disease, stage 3 unspecified: Secondary | ICD-10-CM | POA: Diagnosis not present

## 2023-04-02 DIAGNOSIS — N390 Urinary tract infection, site not specified: Secondary | ICD-10-CM | POA: Diagnosis not present

## 2023-04-02 DIAGNOSIS — I130 Hypertensive heart and chronic kidney disease with heart failure and stage 1 through stage 4 chronic kidney disease, or unspecified chronic kidney disease: Secondary | ICD-10-CM | POA: Diagnosis not present

## 2023-04-02 DIAGNOSIS — E1122 Type 2 diabetes mellitus with diabetic chronic kidney disease: Secondary | ICD-10-CM | POA: Diagnosis present

## 2023-04-02 DIAGNOSIS — M19041 Primary osteoarthritis, right hand: Secondary | ICD-10-CM | POA: Diagnosis not present

## 2023-04-02 DIAGNOSIS — F32A Depression, unspecified: Secondary | ICD-10-CM | POA: Diagnosis not present

## 2023-04-02 DIAGNOSIS — F419 Anxiety disorder, unspecified: Secondary | ICD-10-CM | POA: Diagnosis not present

## 2023-04-02 DIAGNOSIS — R0689 Other abnormalities of breathing: Secondary | ICD-10-CM | POA: Diagnosis not present

## 2023-04-02 DIAGNOSIS — Z9861 Coronary angioplasty status: Secondary | ICD-10-CM | POA: Diagnosis not present

## 2023-04-02 DIAGNOSIS — Z1623 Resistance to quinolones and fluoroquinolones: Secondary | ICD-10-CM | POA: Diagnosis not present

## 2023-04-02 DIAGNOSIS — G9340 Encephalopathy, unspecified: Secondary | ICD-10-CM | POA: Diagnosis not present

## 2023-04-02 DIAGNOSIS — R9431 Abnormal electrocardiogram [ECG] [EKG]: Secondary | ICD-10-CM | POA: Diagnosis not present

## 2023-04-02 DIAGNOSIS — Z8673 Personal history of transient ischemic attack (TIA), and cerebral infarction without residual deficits: Secondary | ICD-10-CM | POA: Diagnosis not present

## 2023-04-02 DIAGNOSIS — R6521 Severe sepsis with septic shock: Secondary | ICD-10-CM | POA: Diagnosis not present

## 2023-04-02 DIAGNOSIS — I9589 Other hypotension: Secondary | ICD-10-CM | POA: Diagnosis present

## 2023-04-02 DIAGNOSIS — G2581 Restless legs syndrome: Secondary | ICD-10-CM | POA: Diagnosis not present

## 2023-04-02 DIAGNOSIS — R509 Fever, unspecified: Secondary | ICD-10-CM | POA: Diagnosis not present

## 2023-04-02 DIAGNOSIS — R55 Syncope and collapse: Secondary | ICD-10-CM | POA: Diagnosis not present

## 2023-04-02 DIAGNOSIS — I2510 Atherosclerotic heart disease of native coronary artery without angina pectoris: Secondary | ICD-10-CM | POA: Diagnosis not present

## 2023-04-02 DIAGNOSIS — R4182 Altered mental status, unspecified: Secondary | ICD-10-CM | POA: Diagnosis not present

## 2023-04-02 DIAGNOSIS — B964 Proteus (mirabilis) (morganii) as the cause of diseases classified elsewhere: Secondary | ICD-10-CM | POA: Diagnosis not present

## 2023-04-02 DIAGNOSIS — E7800 Pure hypercholesterolemia, unspecified: Secondary | ICD-10-CM | POA: Diagnosis not present

## 2023-04-02 DIAGNOSIS — A419 Sepsis, unspecified organism: Secondary | ICD-10-CM | POA: Diagnosis not present

## 2023-04-02 DIAGNOSIS — R404 Transient alteration of awareness: Secondary | ICD-10-CM | POA: Diagnosis not present

## 2023-04-02 DIAGNOSIS — Z1611 Resistance to penicillins: Secondary | ICD-10-CM | POA: Diagnosis not present

## 2023-04-02 DIAGNOSIS — J449 Chronic obstructive pulmonary disease, unspecified: Secondary | ICD-10-CM | POA: Diagnosis present

## 2023-04-02 DIAGNOSIS — I21A1 Myocardial infarction type 2: Secondary | ICD-10-CM | POA: Diagnosis not present

## 2023-04-02 DIAGNOSIS — E876 Hypokalemia: Secondary | ICD-10-CM | POA: Diagnosis not present

## 2023-04-02 DIAGNOSIS — I5042 Chronic combined systolic (congestive) and diastolic (congestive) heart failure: Secondary | ICD-10-CM | POA: Diagnosis not present

## 2023-04-02 DIAGNOSIS — I491 Atrial premature depolarization: Secondary | ICD-10-CM | POA: Diagnosis not present

## 2023-04-02 DIAGNOSIS — N189 Chronic kidney disease, unspecified: Secondary | ICD-10-CM | POA: Diagnosis not present

## 2023-04-08 DEATH — deceased

## 2023-04-19 NOTE — Progress Notes (Signed)
Carelink Summary Report / Loop Recorder 

## 2023-05-08 ENCOUNTER — Ambulatory Visit: Payer: Medicare Other | Admitting: Cardiology
